# Patient Record
Sex: Female | Born: 1940 | Race: White | Hispanic: No | Smoking: Never smoker
Health system: Southern US, Community
[De-identification: ages and names within clinical notes are randomized; demographics above are authoritative.]

## PROBLEM LIST (undated history)

## (undated) DIAGNOSIS — R7309 Other abnormal glucose: Secondary | ICD-10-CM

## (undated) DIAGNOSIS — I82409 Acute embolism and thrombosis of unspecified deep veins of unspecified lower extremity: Secondary | ICD-10-CM

## (undated) DIAGNOSIS — Z1589 Genetic susceptibility to other disease: Secondary | ICD-10-CM

## (undated) DIAGNOSIS — T39395A Adverse effect of other nonsteroidal anti-inflammatory drugs [NSAID], initial encounter: Secondary | ICD-10-CM

## (undated) DIAGNOSIS — Z9889 Other specified postprocedural states: Secondary | ICD-10-CM

## (undated) DIAGNOSIS — D471 Chronic myeloproliferative disease: Secondary | ICD-10-CM

## (undated) DIAGNOSIS — N200 Calculus of kidney: Secondary | ICD-10-CM

## (undated) DIAGNOSIS — K219 Gastro-esophageal reflux disease without esophagitis: Secondary | ICD-10-CM

## (undated) DIAGNOSIS — K250 Acute gastric ulcer with hemorrhage: Secondary | ICD-10-CM

## (undated) DIAGNOSIS — I2699 Other pulmonary embolism without acute cor pulmonale: Secondary | ICD-10-CM

## (undated) DIAGNOSIS — M810 Age-related osteoporosis without current pathological fracture: Secondary | ICD-10-CM

## (undated) DIAGNOSIS — D689 Coagulation defect, unspecified: Secondary | ICD-10-CM

## (undated) DIAGNOSIS — I10 Essential (primary) hypertension: Secondary | ICD-10-CM

## (undated) DIAGNOSIS — H269 Unspecified cataract: Secondary | ICD-10-CM

## (undated) DIAGNOSIS — K922 Gastrointestinal hemorrhage, unspecified: Secondary | ICD-10-CM

## (undated) DIAGNOSIS — D219 Benign neoplasm of connective and other soft tissue, unspecified: Secondary | ICD-10-CM

## (undated) HISTORY — DX: Genetic susceptibility to other disease: Z15.89

## (undated) HISTORY — DX: Gastro-esophageal reflux disease without esophagitis: K21.9

## (undated) HISTORY — PX: CHOLECYSTECTOMY: SHX55

## (undated) HISTORY — PX: UTERINE FIBROID SURGERY: SHX826

## (undated) HISTORY — DX: Age-related osteoporosis without current pathological fracture: M81.0

## (undated) HISTORY — DX: Gastrointestinal hemorrhage, unspecified: K92.2

## (undated) HISTORY — DX: Chronic myeloproliferative disease: D47.1

## (undated) HISTORY — DX: Acute gastric ulcer with hemorrhage: K25.0

## (undated) HISTORY — PX: OTHER SURGICAL HISTORY: SHX169

## (undated) HISTORY — PX: JOINT REPLACEMENT: SHX530

## (undated) HISTORY — DX: Adverse effect of other nonsteroidal anti-inflammatory drugs (NSAID), initial encounter: T39.395A

## (undated) HISTORY — DX: Coagulation defect, unspecified: D68.9

## (undated) HISTORY — DX: Calculus of kidney: N20.0

## (undated) HISTORY — DX: Essential (primary) hypertension: I10

## (undated) HISTORY — DX: Unspecified cataract: H26.9

## (undated) HISTORY — DX: Other abnormal glucose: R73.09

## (undated) HISTORY — DX: Benign neoplasm of connective and other soft tissue, unspecified: D21.9

## (undated) HISTORY — PX: TONSILLECTOMY: SUR1361

---

## 2004-04-14 ENCOUNTER — Other Ambulatory Visit: Admission: RE | Admit: 2004-04-14 | Discharge: 2004-04-14 | Payer: Self-pay | Admitting: Internal Medicine

## 2004-06-02 ENCOUNTER — Ambulatory Visit: Payer: Self-pay | Admitting: Internal Medicine

## 2004-06-02 LAB — HM COLONOSCOPY: HM Colonoscopy: NORMAL

## 2005-12-27 ENCOUNTER — Other Ambulatory Visit: Admission: RE | Admit: 2005-12-27 | Discharge: 2005-12-27 | Payer: Self-pay | Admitting: Internal Medicine

## 2006-09-28 ENCOUNTER — Ambulatory Visit: Admission: RE | Admit: 2006-09-28 | Discharge: 2006-09-28 | Payer: Self-pay | Admitting: Internal Medicine

## 2008-01-20 ENCOUNTER — Other Ambulatory Visit: Admission: RE | Admit: 2008-01-20 | Discharge: 2008-01-20 | Payer: Self-pay | Admitting: Internal Medicine

## 2008-01-27 LAB — HM PAP SMEAR: HM Pap smear: NORMAL

## 2008-01-30 ENCOUNTER — Ambulatory Visit (HOSPITAL_COMMUNITY): Admission: RE | Admit: 2008-01-30 | Discharge: 2008-01-30 | Payer: Self-pay | Admitting: Internal Medicine

## 2009-06-07 ENCOUNTER — Emergency Department (HOSPITAL_COMMUNITY): Admission: EM | Admit: 2009-06-07 | Discharge: 2009-06-07 | Payer: Self-pay | Admitting: Emergency Medicine

## 2009-06-09 ENCOUNTER — Ambulatory Visit (HOSPITAL_COMMUNITY): Admission: RE | Admit: 2009-06-09 | Discharge: 2009-06-09 | Payer: Self-pay | Admitting: Internal Medicine

## 2009-06-11 ENCOUNTER — Ambulatory Visit (HOSPITAL_COMMUNITY): Admission: RE | Admit: 2009-06-11 | Discharge: 2009-06-11 | Payer: Self-pay | Admitting: Urology

## 2009-06-28 ENCOUNTER — Encounter: Admission: RE | Admit: 2009-06-28 | Discharge: 2009-06-28 | Payer: Self-pay | Admitting: Internal Medicine

## 2009-07-05 ENCOUNTER — Encounter: Admission: RE | Admit: 2009-07-05 | Discharge: 2009-07-05 | Payer: Self-pay | Admitting: Internal Medicine

## 2009-08-05 ENCOUNTER — Ambulatory Visit (HOSPITAL_COMMUNITY): Admission: RE | Admit: 2009-08-05 | Discharge: 2009-08-05 | Payer: Self-pay | Admitting: Obstetrics and Gynecology

## 2010-03-01 ENCOUNTER — Ambulatory Visit (HOSPITAL_COMMUNITY): Admission: RE | Admit: 2010-03-01 | Discharge: 2010-03-01 | Payer: Self-pay | Admitting: Internal Medicine

## 2010-10-16 LAB — BASIC METABOLIC PANEL
BUN: 16 mg/dL (ref 6–23)
CO2: 26 mEq/L (ref 19–32)
Calcium: 9.7 mg/dL (ref 8.4–10.5)
Chloride: 103 mEq/L (ref 96–112)
Creatinine, Ser: 0.75 mg/dL (ref 0.4–1.2)
GFR calc Af Amer: >60 mL/min (ref >60)
GFR calc non Af Amer: >60 mL/min (ref >60)
Glucose, Bld: 84 mg/dL (ref 70–99)
Potassium: 4 mEq/L (ref 3.5–5.1)
Sodium: 136 mEq/L (ref 135–145)

## 2010-10-16 LAB — CBC
HCT: 40.3 % (ref 36.0–46.0)
Hemoglobin: 13.3 g/dL (ref 12.0–15.0)
MCHC: 33.1 g/dL (ref 30.0–36.0)
MCV: 83.9 fL (ref 78.0–100.0)
Platelets: 236 10*3/uL (ref 150–400)
RBC: 4.79 MIL/uL (ref 3.87–5.11)
RDW: 15 % (ref 11.5–15.5)
WBC: 6.2 10*3/uL (ref 4.0–10.5)

## 2010-11-02 LAB — DIFFERENTIAL
Basophils Absolute: 0 10*3/uL (ref 0.0–0.1)
Basophils Relative: 0 % (ref 0–1)
Eosinophils Absolute: 0 10*3/uL (ref 0.0–0.7)
Eosinophils Relative: 0 % (ref 0–5)
Lymphocytes Relative: 12 % (ref 12–46)
Lymphs Abs: 1.5 10*3/uL (ref 0.7–4.0)
Monocytes Absolute: 1.4 10*3/uL — ABNORMAL HIGH (ref 0.1–1.0)
Monocytes Relative: 11 % (ref 3–12)
Neutro Abs: 9.3 10*3/uL — ABNORMAL HIGH (ref 1.7–7.7)
Neutrophils Relative %: 77 % (ref 43–77)

## 2010-11-02 LAB — COMPREHENSIVE METABOLIC PANEL
ALT: 41 U/L — ABNORMAL HIGH (ref 0–35)
AST: 34 U/L (ref 0–37)
Albumin: 3 g/dL — ABNORMAL LOW (ref 3.5–5.2)
Alkaline Phosphatase: 82 U/L (ref 39–117)
BUN: 22 mg/dL (ref 6–23)
CO2: 30 mEq/L (ref 19–32)
Calcium: 9.6 mg/dL (ref 8.4–10.5)
Chloride: 96 mEq/L (ref 96–112)
Creatinine, Ser: 1.42 mg/dL — ABNORMAL HIGH (ref 0.4–1.2)
GFR calc Af Amer: 44 mL/min — ABNORMAL LOW (ref >60)
GFR calc non Af Amer: 37 mL/min — ABNORMAL LOW (ref >60)
Glucose, Bld: 117 mg/dL — ABNORMAL HIGH (ref 70–99)
Potassium: 4.5 mEq/L (ref 3.5–5.1)
Sodium: 138 mEq/L (ref 135–145)
Total Bilirubin: 0.7 mg/dL (ref 0.3–1.2)
Total Protein: 7.6 g/dL (ref 6.0–8.3)

## 2010-11-02 LAB — BASIC METABOLIC PANEL
BUN: 16 mg/dL (ref 6–23)
CO2: 28 mEq/L (ref 19–32)
Calcium: 8.6 mg/dL (ref 8.4–10.5)
Chloride: 88 mEq/L — ABNORMAL LOW (ref 96–112)
Creatinine, Ser: 1.23 mg/dL — ABNORMAL HIGH (ref 0.4–1.2)
GFR calc Af Amer: 52 mL/min — ABNORMAL LOW (ref >60)
GFR calc non Af Amer: 43 mL/min — ABNORMAL LOW (ref >60)
Glucose, Bld: 116 mg/dL — ABNORMAL HIGH (ref 70–99)
Potassium: 2.9 mEq/L — ABNORMAL LOW (ref 3.5–5.1)
Sodium: 130 mEq/L — ABNORMAL LOW (ref 135–145)

## 2010-11-02 LAB — URINALYSIS, ROUTINE W REFLEX MICROSCOPIC
Glucose, UA: NEGATIVE mg/dL
Ketones, ur: 40 mg/dL — AB
Nitrite: NEGATIVE
Protein, ur: NEGATIVE mg/dL
Specific Gravity, Urine: 1.024 (ref 1.005–1.030)
Urobilinogen, UA: 1 mg/dL (ref 0.0–1.0)
pH: 5.5 (ref 5.0–8.0)

## 2010-11-02 LAB — CBC
HCT: 39.1 % (ref 36.0–46.0)
Hemoglobin: 13.3 g/dL (ref 12.0–15.0)
MCHC: 34 g/dL (ref 30.0–36.0)
MCV: 83 fL (ref 78.0–100.0)
Platelets: 273 10*3/uL (ref 150–400)
RBC: 4.71 MIL/uL (ref 3.87–5.11)
RDW: 13.7 % (ref 11.5–15.5)
WBC: 12.2 10*3/uL — ABNORMAL HIGH (ref 4.0–10.5)

## 2010-11-02 LAB — URINE MICROSCOPIC-ADD ON

## 2011-10-19 DIAGNOSIS — E782 Mixed hyperlipidemia: Secondary | ICD-10-CM | POA: Diagnosis not present

## 2011-10-19 DIAGNOSIS — E559 Vitamin D deficiency, unspecified: Secondary | ICD-10-CM | POA: Diagnosis not present

## 2011-10-19 DIAGNOSIS — I10 Essential (primary) hypertension: Secondary | ICD-10-CM | POA: Diagnosis not present

## 2012-02-07 DIAGNOSIS — R609 Edema, unspecified: Secondary | ICD-10-CM | POA: Diagnosis not present

## 2012-02-07 DIAGNOSIS — R0602 Shortness of breath: Secondary | ICD-10-CM | POA: Diagnosis not present

## 2012-02-21 DIAGNOSIS — I10 Essential (primary) hypertension: Secondary | ICD-10-CM | POA: Diagnosis not present

## 2012-02-21 DIAGNOSIS — Z79899 Other long term (current) drug therapy: Secondary | ICD-10-CM | POA: Diagnosis not present

## 2012-02-22 DIAGNOSIS — H2510 Age-related nuclear cataract, unspecified eye: Secondary | ICD-10-CM | POA: Diagnosis not present

## 2012-02-22 DIAGNOSIS — H04129 Dry eye syndrome of unspecified lacrimal gland: Secondary | ICD-10-CM | POA: Diagnosis not present

## 2012-02-22 DIAGNOSIS — H43819 Vitreous degeneration, unspecified eye: Secondary | ICD-10-CM | POA: Diagnosis not present

## 2012-02-22 DIAGNOSIS — H524 Presbyopia: Secondary | ICD-10-CM | POA: Diagnosis not present

## 2012-03-14 DIAGNOSIS — Z1231 Encounter for screening mammogram for malignant neoplasm of breast: Secondary | ICD-10-CM | POA: Diagnosis not present

## 2012-03-19 DIAGNOSIS — I10 Essential (primary) hypertension: Secondary | ICD-10-CM | POA: Diagnosis not present

## 2012-03-19 DIAGNOSIS — Z1212 Encounter for screening for malignant neoplasm of rectum: Secondary | ICD-10-CM | POA: Diagnosis not present

## 2012-03-19 DIAGNOSIS — Z79899 Other long term (current) drug therapy: Secondary | ICD-10-CM | POA: Diagnosis not present

## 2012-03-19 DIAGNOSIS — E782 Mixed hyperlipidemia: Secondary | ICD-10-CM | POA: Diagnosis not present

## 2012-03-19 DIAGNOSIS — E559 Vitamin D deficiency, unspecified: Secondary | ICD-10-CM | POA: Diagnosis not present

## 2012-08-14 DIAGNOSIS — Z23 Encounter for immunization: Secondary | ICD-10-CM | POA: Diagnosis not present

## 2012-10-31 DIAGNOSIS — E559 Vitamin D deficiency, unspecified: Secondary | ICD-10-CM | POA: Diagnosis not present

## 2012-10-31 DIAGNOSIS — E782 Mixed hyperlipidemia: Secondary | ICD-10-CM | POA: Diagnosis not present

## 2012-10-31 DIAGNOSIS — R7309 Other abnormal glucose: Secondary | ICD-10-CM | POA: Diagnosis not present

## 2012-10-31 DIAGNOSIS — Z79899 Other long term (current) drug therapy: Secondary | ICD-10-CM | POA: Diagnosis not present

## 2012-10-31 DIAGNOSIS — I10 Essential (primary) hypertension: Secondary | ICD-10-CM | POA: Diagnosis not present

## 2013-02-27 DIAGNOSIS — H2510 Age-related nuclear cataract, unspecified eye: Secondary | ICD-10-CM | POA: Diagnosis not present

## 2013-02-27 DIAGNOSIS — H43819 Vitreous degeneration, unspecified eye: Secondary | ICD-10-CM | POA: Diagnosis not present

## 2013-02-27 DIAGNOSIS — H04129 Dry eye syndrome of unspecified lacrimal gland: Secondary | ICD-10-CM | POA: Diagnosis not present

## 2013-02-27 DIAGNOSIS — H524 Presbyopia: Secondary | ICD-10-CM | POA: Diagnosis not present

## 2013-03-19 DIAGNOSIS — E559 Vitamin D deficiency, unspecified: Secondary | ICD-10-CM | POA: Diagnosis not present

## 2013-03-19 DIAGNOSIS — R7309 Other abnormal glucose: Secondary | ICD-10-CM | POA: Diagnosis not present

## 2013-03-19 DIAGNOSIS — I10 Essential (primary) hypertension: Secondary | ICD-10-CM | POA: Diagnosis not present

## 2013-03-19 DIAGNOSIS — Z79899 Other long term (current) drug therapy: Secondary | ICD-10-CM | POA: Diagnosis not present

## 2013-03-19 DIAGNOSIS — E782 Mixed hyperlipidemia: Secondary | ICD-10-CM | POA: Diagnosis not present

## 2013-03-19 DIAGNOSIS — Z1212 Encounter for screening for malignant neoplasm of rectum: Secondary | ICD-10-CM | POA: Diagnosis not present

## 2013-03-21 ENCOUNTER — Other Ambulatory Visit: Payer: Self-pay

## 2013-03-21 ENCOUNTER — Other Ambulatory Visit: Payer: Self-pay | Admitting: Internal Medicine

## 2013-03-21 DIAGNOSIS — M79605 Pain in left leg: Secondary | ICD-10-CM

## 2013-03-24 ENCOUNTER — Ambulatory Visit
Admission: RE | Admit: 2013-03-24 | Discharge: 2013-03-24 | Disposition: A | Discharge status: Home / Self Care | Payer: Medicare Other | Source: Ambulatory Visit | Attending: Internal Medicine | Admitting: Internal Medicine

## 2013-03-24 DIAGNOSIS — M7989 Other specified soft tissue disorders: Secondary | ICD-10-CM | POA: Diagnosis not present

## 2013-03-24 DIAGNOSIS — M79605 Pain in left leg: Secondary | ICD-10-CM

## 2013-03-24 DIAGNOSIS — M79609 Pain in unspecified limb: Secondary | ICD-10-CM | POA: Diagnosis not present

## 2013-04-09 DIAGNOSIS — Z803 Family history of malignant neoplasm of breast: Secondary | ICD-10-CM | POA: Diagnosis not present

## 2013-04-09 DIAGNOSIS — M899 Disorder of bone, unspecified: Secondary | ICD-10-CM | POA: Diagnosis not present

## 2013-04-09 DIAGNOSIS — Z1231 Encounter for screening mammogram for malignant neoplasm of breast: Secondary | ICD-10-CM | POA: Diagnosis not present

## 2013-04-09 LAB — HM MAMMOGRAPHY: HM Mammogram: NORMAL

## 2013-04-09 LAB — HM DEXA SCAN

## 2013-04-22 ENCOUNTER — Other Ambulatory Visit: Payer: Self-pay | Admitting: Physician Assistant

## 2013-04-22 DIAGNOSIS — D485 Neoplasm of uncertain behavior of skin: Secondary | ICD-10-CM | POA: Diagnosis not present

## 2013-04-22 DIAGNOSIS — I8310 Varicose veins of unspecified lower extremity with inflammation: Secondary | ICD-10-CM | POA: Diagnosis not present

## 2013-05-08 ENCOUNTER — Other Ambulatory Visit: Payer: Self-pay | Admitting: Physician Assistant

## 2013-05-08 DIAGNOSIS — D485 Neoplasm of uncertain behavior of skin: Secondary | ICD-10-CM | POA: Diagnosis not present

## 2013-05-08 DIAGNOSIS — D2370 Other benign neoplasm of skin of unspecified lower limb, including hip: Secondary | ICD-10-CM | POA: Diagnosis not present

## 2013-06-10 ENCOUNTER — Telehealth: Payer: Self-pay | Admitting: *Deleted

## 2013-06-10 MED ORDER — METOPROLOL SUCCINATE ER 100 MG PO TB24: 100.0000 mg | ORAL_TABLET | Freq: Every day | ORAL | Status: DC

## 2013-06-10 NOTE — Telephone Encounter (Signed)
Refill ON =  METOPROLOL  (BP MED)  CALL [facility].  PH [phone] PT HAS APPT SCHEDULED ON 11/20 SAYS HER PHARMACY HAS BEEN TRYING TO GET A REFILL FOR A WK .

## 2013-06-18 ENCOUNTER — Encounter: Payer: Self-pay | Admitting: Internal Medicine

## 2013-06-18 DIAGNOSIS — E782 Mixed hyperlipidemia: Secondary | ICD-10-CM | POA: Insufficient documentation

## 2013-06-18 DIAGNOSIS — I10 Essential (primary) hypertension: Secondary | ICD-10-CM | POA: Insufficient documentation

## 2013-06-18 DIAGNOSIS — N200 Calculus of kidney: Secondary | ICD-10-CM | POA: Insufficient documentation

## 2013-06-18 DIAGNOSIS — E559 Vitamin D deficiency, unspecified: Secondary | ICD-10-CM | POA: Insufficient documentation

## 2013-06-18 DIAGNOSIS — K219 Gastro-esophageal reflux disease without esophagitis: Secondary | ICD-10-CM | POA: Insufficient documentation

## 2013-06-18 DIAGNOSIS — R7309 Other abnormal glucose: Secondary | ICD-10-CM | POA: Insufficient documentation

## 2013-06-18 NOTE — Patient Instructions (Addendum)
Continue diet & medications same as discussed.   Further disposition pending lab results.        Gastroesophageal Reflux Disease, Adult Gastroesophageal reflux disease (GERD) happens when acid from your stomach flows up into the esophagus. When acid comes in contact with the esophagus, the acid causes soreness (inflammation) in the esophagus. Over time, GERD may create small holes (ulcers) in the lining of the esophagus. CAUSES   Increased body weight. This puts pressure on the stomach, making acid rise from the stomach into the esophagus.  Smoking. This increases acid production in the stomach.  Drinking alcohol. This causes decreased pressure in the lower esophageal sphincter (valve or ring of muscle between the esophagus and stomach), allowing acid from the stomach into the esophagus.  Late evening meals and a full stomach. This increases pressure and acid production in the stomach.  A malformed lower esophageal sphincter. Sometimes, no cause is found. SYMPTOMS   Burning pain in the lower part of the mid-chest behind the breastbone and in the mid-stomach area. This may occur twice a week or more often.  Trouble swallowing.  Sore throat.  Dry cough.  Asthma-like symptoms including chest tightness, shortness of breath, or wheezing. DIAGNOSIS  Your caregiver may be able to diagnose GERD based on your symptoms. In some cases, X-rays and other tests may be done to check for complications or to check the condition of your stomach and esophagus. TREATMENT  Your caregiver may recommend over-the-counter or prescription medicines to help decrease acid production. Ask your caregiver before starting or adding any new medicines.  HOME CARE INSTRUCTIONS   Change the factors that you can control. Ask your caregiver for guidance concerning weight loss, quitting smoking, and alcohol consumption.  Avoid foods and drinks that make your symptoms worse, such as:  Caffeine or alcoholic  drinks.  Chocolate.  Peppermint or mint flavorings.  Garlic and onions.  Spicy foods.  Citrus fruits, such as oranges, lemons, or limes.  Tomato-based foods such as sauce, chili, salsa, and pizza.  Fried and fatty foods.  Avoid lying down for the 3 hours prior to your bedtime or prior to taking a nap.  Eat small, frequent meals instead of large meals.  Wear loose-fitting clothing. Do not wear anything tight around your waist that causes pressure on your stomach.  Raise the head of your bed 6 to 8 inches with wood blocks to help you sleep. Extra pillows will not help.  Only take over-the-counter or prescription medicines for pain, discomfort, or fever as directed by your caregiver.  Do not take aspirin, ibuprofen, or other nonsteroidal anti-inflammatory drugs (NSAIDs). SEEK IMMEDIATE MEDICAL CARE IF:   You have pain in your arms, neck, jaw, teeth, or back.  Your pain increases or changes in intensity or duration.  You develop nausea, vomiting, or sweating (diaphoresis).  You develop shortness of breath, or you faint.  Your vomit is green, yellow, black, or looks like coffee grounds or blood.  Your stool is red, bloody, or black. These symptoms could be signs of other problems, such as heart disease, gastric bleeding, or esophageal bleeding. MAKE SURE YOU:   Understand these instructions.  Will watch your condition.  Will get help right away if you are not doing well or get worse. Document Released: 04/26/2005 Document Revised: 10/09/2011 Document Reviewed: 02/03/2011 [facility] Patient Information 2014 [place], [place].  Reflux (acid reflux) is when acid from your stomach flows up into the esophagus. When acid comes in contact with the  esophagus, the acid causes irritation and soreness (inflammation) in the esophagus. When reflux happens often or so severely that it causes damage to the esophagus, it is called gastroesophageal reflux disease (GERD). Nutrition therapy  can help ease the discomfort of GERD. FOODS OR DRINKS TO AVOID OR LIMIT  Smoking or chewing tobacco. Nicotine is one of the most potent stimulants to acid production in the gastrointestinal tract.  Caffeinated and decaffeinated coffee and black tea.  Regular or low-calorie carbonated beverages or energy drinks (caffeine-free carbonated beverages are allowed).   Strong spices, such as black pepper, white pepper, red pepper, cayenne, curry powder, and chili powder.  Peppermint or spearmint.  Chocolate.  High-fat foods, including meats and fried foods. Extra added fats including oils, butter, salad dressings, and nuts. Limit these to less than 8 tsp per day.  Fruits and vegetables if they are not tolerated, such as citrus fruits or tomatoes.  Alcohol.  Any food that seems to aggravate your condition. If you have questions regarding your diet, call your caregiver or a registered dietitian. OTHER THINGS THAT MAY HELP GERD INCLUDE:   Eating your meals slowly, in a relaxed setting.  Eating 5 to 6 small meals per day instead of 3 large meals.  Eliminating food for a period of time if it causes distress.  Not lying down until 3 hours after eating a meal.  Keeping the head of your bed raised 6 to 9 inches (15 to 23 cm) by using a foam wedge or blocks under the legs of the bed. Lying flat may make symptoms worse.  Being physically active. Weight loss may be helpful in reducing reflux in overweight or obese adults.  Wear loose fitting clothing EXAMPLE MEAL PLAN This meal plan is approximately 2,000 calories based on https://www.bernard.org/ meal planning guidelines. Breakfast   cup cooked oatmeal.  1 cup strawberries.  1 cup low-fat milk.  1 oz almonds. Snack  1 cup cucumber slices.  6 oz yogurt (made from low-fat or fat-free milk). Lunch  2 slice whole-wheat bread.  2 oz sliced [place].  2 tsp mayonnaise.  1 cup blueberries.  1 cup snap peas. Snack  6 whole-wheat  crackers.  1 oz string cheese. Dinner   cup brown rice.  1 cup mixed veggies.  1 tsp olive oil.  3 oz grilled fish. Document Released: 07/17/2005 Document Revised: 10/09/2011 Document Reviewed: 06/02/2011 [facility] Patient Information 2014 [place], [place].    Hypertension As your heart beats, it forces blood through your arteries. This force is your blood pressure. If the pressure is too high, it is called hypertension (HTN) or high blood pressure. HTN is dangerous because you may have it and not know it. High blood pressure may mean that your heart has to work harder to pump blood. Your arteries may be narrow or stiff. The extra work puts you at risk for heart disease, stroke, and other problems.  Blood pressure consists of two numbers, a higher number over a lower, 110/72, for example. It is stated as "110 over 72." The ideal is below 120 for the top number (systolic) and under 80 for the bottom (diastolic). Write down your blood pressure today. You should pay close attention to your blood pressure if you have certain conditions such as:  Heart failure.  Prior heart attack.  Diabetes  Chronic kidney disease.  Prior stroke.  Multiple risk factors for heart disease. To see if you have HTN, your blood pressure should be measured while you are seated with  your arm held at the level of the heart. It should be measured at least twice. A one-time elevated blood pressure reading (especially in the Emergency Department) does not mean that you need treatment. There may be conditions in which the blood pressure is different between your right and left arms. It is important to see your caregiver soon for a recheck. Most people have essential hypertension which means that there is not a specific cause. This type of high blood pressure may be lowered by changing lifestyle factors such as:  Stress.  Smoking.  Lack of exercise.  Excessive weight.  Drug/tobacco/alcohol use.  Eating  less salt. Most people do not have symptoms from high blood pressure until it has caused damage to the body. Effective treatment can often prevent, delay or reduce that damage. TREATMENT  When a cause has been identified, treatment for high blood pressure is directed at the cause. There are a large number of medications to treat HTN. These fall into several categories, and your caregiver will help you select the medicines that are best for you. Medications may have side effects. You should review side effects with your caregiver. If your blood pressure stays high after you have made lifestyle changes or started on medicines,   Your medication(s) may need to be changed.  Other problems may need to be addressed.  Be certain you understand your prescriptions, and know how and when to take your medicine.  Be sure to follow up with your caregiver within the time frame advised (usually within two weeks) to have your blood pressure rechecked and to review your medications.  If you are taking more than one medicine to lower your blood pressure, make sure you know how and at what times they should be taken. Taking two medicines at the same time can result in blood pressure that is too low. SEEK IMMEDIATE MEDICAL CARE IF:  You develop a severe headache, blurred or changing vision, or confusion.  You have unusual weakness or numbness, or a faint feeling.  You have severe chest or abdominal pain, vomiting, or breathing problems. MAKE SURE YOU:   Understand these instructions.  Will watch your condition.  Will get help right away if you are not doing well or get worse. Document Released: 07/17/2005 Document Revised: 10/09/2011 Document Reviewed: 03/06/2008 [facility] Patient Information 2014 [place], [place]. Cholesterol Cholesterol is a white, waxy, fat-like protein needed by your body in small amounts. The liver makes all the cholesterol you need. It is carried from the liver by the blood through  the blood vessels. Deposits (plaque) may build up on blood vessel walls. This makes the arteries narrower and stiffer. Plaque increases the risk for heart attack and stroke. You cannot feel your cholesterol level even if it is very high. The only way to know is by a blood test to check your lipid (fats) levels. Once you know your cholesterol levels, you should keep a record of the test results. Work with your caregiver to to keep your levels in the desired range. WHAT THE RESULTS MEAN:  Total cholesterol is a rough measure of all the cholesterol in your blood.  LDL is the so-called bad cholesterol. This is the type that deposits cholesterol in the walls of the arteries. You want this level to be low.  HDL is the good cholesterol because it cleans the arteries and carries the LDL away. You want this level to be high.  Triglycerides are fat that the body can either burn for energy  or store. High levels are closely linked to heart disease. DESIRED LEVELS:  Total cholesterol below 200.  LDL below 100 for people at risk, below 70 for very high risk.  HDL above 50 is good, above 60 is best.  Triglycerides below 150. HOW TO LOWER YOUR CHOLESTEROL:  Diet.  Choose fish or white meat chicken and [place], roasted or baked. Limit fatty cuts of red meat, fried foods, and processed meats, such as sausage and lunch meat.  Eat lots of fresh fruits and vegetables. Choose whole grains, beans, pasta, potatoes and cereals.  Use only small amounts of olive, corn or canola oils. Avoid butter, mayonnaise, shortening or palm kernel oils. Avoid foods with trans-fats.  Use skim/nonfat milk and low-fat/nonfat yogurt and cheeses. Avoid whole milk, cream, ice cream, egg yolks and cheeses. Healthy desserts include angel food cake, ginger snaps, animal crackers, hard candy, popsicles, and low-fat/nonfat frozen yogurt. Avoid pastries, cakes, pies and cookies.  Exercise.  A regular program helps decrease LDL and  raises HDL.  Helps with weight control.  Do things that increase your activity level like gardening, walking, or taking the stairs.  Medication.  May be prescribed by your caregiver to help lowering cholesterol and the risk for heart disease.  You may need medicine even if your levels are normal if you have several risk factors. HOME CARE INSTRUCTIONS   Follow your diet and exercise programs as suggested by your caregiver.  Take medications as directed.  Have blood work done when your caregiver feels it is necessary. MAKE SURE YOU:   Understand these instructions.  Will watch your condition.  Will get help right away if you are not doing well or get worse. Document Released: 04/11/2001 Document Revised: 10/09/2011 Document Reviewed: 10/02/2007 [facility] Patient Information 2014 [place], [place]. Vitamin D Deficiency Vitamin D is an important vitamin that your body needs. Having too little of it in your body is called a deficiency. A very bad deficiency can make your bones soft and can cause a condition called rickets.  Vitamin D is important to your body for different reasons, such as:   It helps your body absorb 2 minerals called calcium and phosphorus.  It helps make your bones healthy.  It may prevent some diseases, such as diabetes and multiple sclerosis.  It helps your muscles and heart. You can get vitamin D in several ways. It is a natural part of some foods. The vitamin is also added to some dairy products and cereals. Some people take vitamin D supplements. Also, your body makes vitamin D when you are in the sun. It changes the sun's rays into a form of the vitamin that your body can use. CAUSES   Not eating enough foods that contain vitamin D.  Not getting enough sunlight.  Having certain digestive system diseases that make it hard to absorb vitamin D. These diseases include Crohn's disease, chronic pancreatitis, and cystic fibrosis.  Having a surgery in which  part of the stomach or small intestine is removed.  Being obese. Fat cells pull vitamin D out of your blood. That means that obese people may not have enough vitamin D left in their blood and in other body tissues.  Having chronic kidney or liver disease. RISK FACTORS Risk factors are things that make you more likely to develop a vitamin D deficiency. They include:  Being older.  Not being able to get outside very much.  Living in a nursing home.  Having had broken bones.  Having  weak or thin bones (osteoporosis).  Having a disease or condition that changes how your body absorbs vitamin D.  Having dark skin.  Some medicines such as seizure medicines or steroids.  Being overweight or obese. SYMPTOMS Mild cases of vitamin D deficiency may not have any symptoms. If you have a very bad case, symptoms may include:  Bone pain.  Muscle pain.  Falling often.  Broken bones caused by a minor injury, due to osteoporosis. DIAGNOSIS A blood test is the best way to tell if you have a vitamin D deficiency. TREATMENT Vitamin D deficiency can be treated in different ways. Treatment for vitamin D deficiency depends on what is causing it. Options include:  Taking vitamin D supplements.  Taking a calcium supplement. Your caregiver will suggest what dose is best for you. HOME CARE INSTRUCTIONS  Take any supplements that your caregiver prescribes. Follow the directions carefully. Take only the suggested amount.  Have your blood tested 2 months after you start taking supplements.  Eat foods that contain vitamin D. Healthy choices include:  Fortified dairy products, cereals, or juices. Fortified means vitamin D has been added to the food. Check the label on the package to be sure.  Fatty fish like salmon or trout.  Eggs.  Oysters.  Do not use a tanning bed.  Keep your weight at a healthy level. Lose weight if you need to.  Keep all follow-up appointments. Your caregiver will  need to perform blood tests to make sure your vitamin D deficiency is going away. SEEK MEDICAL CARE IF:  You have any questions about your treatment.  You continue to have symptoms of vitamin D deficiency.  You have nausea or vomiting.  You are constipated.  You feel confused.  You have severe abdominal or back pain. MAKE SURE YOU:  Understand these instructions.  Will watch your condition.  Will get help right away if you are not doing well or get worse. Document Released: 10/09/2011 Document Revised: 11/11/2012 Document Reviewed: 10/09/2011 [facility] Patient Information 2014 [place], [place]. Diabetes and Exercise Exercising regularly is important. It is not just about losing weight. It has many health benefits, such as:  Improving your overall fitness, flexibility, and endurance.  Increasing your bone density.  Helping with weight control.  Decreasing your body fat.  Increasing your muscle strength.  Reducing stress and tension.  Improving your overall health. People with diabetes who exercise gain additional benefits because exercise:  Reduces appetite.  Improves the body's use of blood sugar (glucose).  Helps lower or control blood glucose.  Decreases blood pressure.  Helps control blood lipids (such as cholesterol and triglycerides).  Improves the body's use of the hormone insulin by:  Increasing the body's insulin sensitivity.  Reducing the body's insulin needs.  Decreases the risk for heart disease because exercising:  Lowers cholesterol and triglycerides levels.  Increases the levels of good cholesterol (such as high-density lipoproteins [HDL]) in the body.  Lowers blood glucose levels. YOUR ACTIVITY PLAN  Choose an activity that you enjoy and set realistic goals. Your health care provider or diabetes educator can help you make an activity plan that works for you. You can break activities into 2 or 3 sessions throughout the day. Doing so is as  good as one long session. Exercise ideas include:  Taking the dog for a walk.  Taking the stairs instead of the elevator.  Dancing to your favorite song.  Doing your favorite exercise with a friend. RECOMMENDATIONS FOR EXERCISING WITH TYPE 1  OR TYPE 2 DIABETES   Check your blood glucose before exercising. If blood glucose levels are greater than 240 mg/dL, check for urine ketones. Do not exercise if ketones are present.  Avoid injecting insulin into areas of the body that are going to be exercised. For example, avoid injecting insulin into:  The arms when playing tennis.  The legs when jogging.  Keep a record of:  Food intake before and after you exercise.  Expected peak times of insulin action.  Blood glucose levels before and after you exercise.  The type and amount of exercise you have done.  Review your records with your health care provider. Your health care provider will help you to develop guidelines for adjusting food intake and insulin amounts before and after exercising.  If you take insulin or oral hypoglycemic agents, watch for signs and symptoms of hypoglycemia. They include:  Dizziness.  Shaking.  Sweating.  Chills.  Confusion.  Drink plenty of water while you exercise to prevent dehydration or heat stroke. Body water is lost during exercise and must be replaced.  Talk to your health care provider before starting an exercise program to make sure it is safe for you. Remember, almost any type of activity is better than none. Document Released: 10/07/2003 Document Revised: 03/19/2013 Document Reviewed: 12/24/2012 [facility] Patient Information 2014 [place], [place]. Cholesterol Cholesterol is a white, waxy, fat-like protein needed by your body in small amounts. The liver makes all the cholesterol you need. It is carried from the liver by the blood through the blood vessels. Deposits (plaque) may build up on blood vessel walls. This makes the arteries narrower  and stiffer. Plaque increases the risk for heart attack and stroke. You cannot feel your cholesterol level even if it is very high. The only way to know is by a blood test to check your lipid (fats) levels. Once you know your cholesterol levels, you should keep a record of the test results. Work with your caregiver to to keep your levels in the desired range. WHAT THE RESULTS MEAN:  Total cholesterol is a rough measure of all the cholesterol in your blood.  LDL is the so-called bad cholesterol. This is the type that deposits cholesterol in the walls of the arteries. You want this level to be low.  HDL is the good cholesterol because it cleans the arteries and carries the LDL away. You want this level to be high.  Triglycerides are fat that the body can either burn for energy or store. High levels are closely linked to heart disease. DESIRED LEVELS:  Total cholesterol below 200.  LDL below 100 for people at risk, below 70 for very high risk.  HDL above 50 is good, above 60 is best.  Triglycerides below 150. HOW TO LOWER YOUR CHOLESTEROL:  Diet.  Choose fish or white meat chicken and [place], roasted or baked. Limit fatty cuts of red meat, fried foods, and processed meats, such as sausage and lunch meat.  Eat lots of fresh fruits and vegetables. Choose whole grains, beans, pasta, potatoes and cereals.  Use only small amounts of olive, corn or canola oils. Avoid butter, mayonnaise, shortening or palm kernel oils. Avoid foods with trans-fats.  Use skim/nonfat milk and low-fat/nonfat yogurt and cheeses. Avoid whole milk, cream, ice cream, egg yolks and cheeses. Healthy desserts include angel food cake, ginger snaps, animal crackers, hard candy, popsicles, and low-fat/nonfat frozen yogurt. Avoid pastries, cakes, pies and cookies.  Exercise.  A regular program helps decrease LDL and  raises HDL.  Helps with weight control.  Do things that increase your activity level like gardening,  walking, or taking the stairs.  Medication.  May be prescribed by your caregiver to help lowering cholesterol and the risk for heart disease.  You may need medicine even if your levels are normal if you have several risk factors. HOME CARE INSTRUCTIONS   Follow your diet and exercise programs as suggested by your caregiver.  Take medications as directed.  Have blood work done when your caregiver feels it is necessary. MAKE SURE YOU:   Understand these instructions.  Will watch your condition.  Will get help right away if you are not doing well or get worse. Document Released: 04/11/2001 Document Revised: 10/09/2011 Document Reviewed: 10/02/2007 [facility] Patient Information 2014 [place], [place].

## 2013-06-18 NOTE — Progress Notes (Signed)
Patient ID: Kimberly Watson, female   DOB: Aug 29, 1940, 72 y.o.   MRN: [id number]   This very nice 72 yo   presents for 3 month follow up with hypertension, hx/o hyperlipidemia, pre-diabetes and vitamin D deficiency.    BP has been controlled at home. Today's BP is 140.80. Patient denies any cardiac type chest pain, palpitations, dyspnea/orthopnea/PND, dizziness, claudication, or dependent edema.   Hyperlipidemia is controlled with diet. Last cholesterol was 162, HDL  63 , and LDL   73 - all at goal . Patient denies myalgias or other med SE's.    Also, the patient has history of prediabetes/insulin resistance with last A1c of 5.4%. Patient denies any symptoms of reactive hypoglycemia, diabetic polys, paresthesias or visual blurring.   Patient relates she recently saw Dr Kimberly Barters PA for rash on her Lt leg and Bx of atypical lesion.   Further, Patient has history of vitamin D deficiency with last vitamin D of 50. Patient supplements vitamin D without any suspected side-effects.  Current Outpatient Prescriptions on File Prior to Visit  Medication Sig Dispense Refill  . aspirin 81 MG tablet Take 81 mg by mouth every other day.      . calcium carbonate (OS-CAL) 600 MG TABS tablet Take 600 mg by mouth daily.      . cholecalciferol (VITAMIN D) 1000 UNITS tablet Take 1,000 Units by mouth 1 day or 1 dose. Take 2 tablets daily.      . furosemide (LASIX) 40 MG tablet Take 40 mg by mouth.      . metoprolol succinate (TOPROL XL) 100 MG 24 hr tablet Take 1 tablet (100 mg total) by mouth daily. Take with or immediately following a meal.  30 tablet  0  . raloxifene (EVISTA) 60 MG tablet Take 60 mg by mouth daily.       No current facility-administered medications on file prior to visit.     Allergies  Allergen Reactions  . Amoxicillin   . Fosamax [Alendronate Sodium]     esophagitis    PMHx:   Past Medical History  Diagnosis Date  . Hypertension   . Osteoporosis   . GERD (gastroesophageal reflux  disease)   . Nephrolithiasis   . Cataract   . Elevated hemoglobin A1c   . Leiomyoma     adnexal Leiomyoma removal in 1/11    FHx:    Reviewed / unchanged  SHx:    Reviewed / unchanged. Caretaker for her 23 yo father.  Systems Review: Constitutional: Denies fever, chills, wt changes, headaches, insomnia, fatigue, night sweats, change in appetite. Eyes: Denies redness, blurred vision, diplopia, discharge, itchy, watery eyes.  ENT: Denies discharge, congestion, post nasal drip, epistaxis, sore throat, earache, hearing loss, dental pain, tinnitus, vertigo, sinus pain, snoring.  CV: Denies chest pain, palpitations, irregular heartbeat, syncope, dyspnea, diaphoresis, orthopnea, PND, claudication, edema. Respiratory: denies cough, dyspnea, DOE, pleurisy, hoarseness, laryngitis, wheezing.  Gastrointestinal: Denies dysphagia, odynophagia, heartburn, reflux, water brash, abdominal pain or cramps, nausea, vomiting, bloating, diarrhea, constipation, hematemesis, melena, hematochezia,  Hemorrhoids. Genitourinary: Denies dysuria, frequency, urgency, nocturia, hesitancy, discharge, hematuria, flank pain. Musculoskeletal: Denies arthralgias, myalgias, stiffness, jt. swelling, pain, limp, strain/sprain.  Skin: Denies pruritus, rash, hives, warts, acne, eczema, change in skin lesion(s). Neuro: No weakness, tremor, incoordination, spasms, paresthesia, or pain. Psychiatric: Denies confusion, memory loss, or sensory loss. Endo: Denies change in weight, skin, hair change.  Heme/Lymph: No excessive bleeding, bruising, orenlarged lymph nodes.   BP 140/84   P 72   R  18   Ht 5'4"   Wt  172 #   On Exam: Appears well nourished - in no distress. Eyes: PERRLA, EOMs, conjunctiva no swelling or erythema. Sinuses: No frontal/maxillary tenderness ENT/Mouth: EAC's clear, TM's nl w/o erythema, bulging. Nares clear w/o erythema, swelling, exudates. Oropharynx clear without erythema or exudates. Oral hygiene is good.  Tongue normal, non obstructing. Hearing intact.  Neck: Supple. Thyroid nl. Car 2+/2+ without bruits, nodes or JVD. Chest: Respirations nl with BS clear & equal w/o rales, rhonchi, wheezing or stridor.  Cor: Heart sounds normal w/ regular rate and rhythm without sig. murmurs, gallops, clicks, or rubs. Peripheral pulses normal and equal  without edema.  Musculoskeletal: Full ROM all peripheral extremities, joint stability, 5/5 strength, and normal gait.  Skin: Warm, dry without exposed rashes, lesions, ecchymosis apparent.  Neuro: Cranial nerves intact, reflexes equal bilaterally. Sensory-motor testing grossly intact. Tendon reflexes grossly intact.  Pysch: Alert & oriented x 3. Insight and judgement nl & appropriate. No ideations.  Assessment and Plan:  1. Hypertension - Continue monitor blood pressure at home. Continue diet/meds same.  2. Hyperlipidemia - Continue diet, exercise as able,& lifestyle modifications. Continue monitor periodic cholesterol/liver & renal functions   3. Pre-diabetes - Continue diet, exercise, lifestyle modifications. Monitor appropriate labs.  4. Vitamin D Deficiency - Continue supplementation.  5. GERD - discussed iet w/ written instructions and recc trial of OTC Ranitidine 150 mg 1-2 tab qhs and call if Sx's not resolve.  Further disposition pending results of labs.

## 2013-06-19 ENCOUNTER — Ambulatory Visit: Payer: Medicare Other | Admitting: Internal Medicine

## 2013-06-19 ENCOUNTER — Encounter: Payer: Self-pay | Admitting: Internal Medicine

## 2013-06-19 VITALS — BP 140/84 | HR 72 | Temp 98.2°F | Resp 18 | Ht 64.0 in | Wt 172.6 lb

## 2013-06-19 DIAGNOSIS — E782 Mixed hyperlipidemia: Secondary | ICD-10-CM

## 2013-06-19 DIAGNOSIS — Z5189 Encounter for other specified aftercare: Secondary | ICD-10-CM

## 2013-06-19 DIAGNOSIS — E559 Vitamin D deficiency, unspecified: Secondary | ICD-10-CM | POA: Diagnosis not present

## 2013-06-19 DIAGNOSIS — N200 Calculus of kidney: Secondary | ICD-10-CM

## 2013-06-19 DIAGNOSIS — K2100 Gastro-esophageal reflux disease with esophagitis, without bleeding: Secondary | ICD-10-CM

## 2013-06-19 DIAGNOSIS — I10 Essential (primary) hypertension: Secondary | ICD-10-CM

## 2013-06-19 DIAGNOSIS — R7309 Other abnormal glucose: Secondary | ICD-10-CM | POA: Diagnosis not present

## 2013-06-19 DIAGNOSIS — Z79899 Other long term (current) drug therapy: Secondary | ICD-10-CM | POA: Diagnosis not present

## 2013-06-19 LAB — CBC WITH DIFFERENTIAL/PLATELET
Basophils Absolute: 0.1 10*3/uL (ref 0.0–0.1)
Basophils Relative: 1 % (ref 0–1)
Eosinophils Absolute: 0.2 10*3/uL (ref 0.0–0.7)
Eosinophils Relative: 3 % (ref 0–5)
HCT: 44.1 % (ref 36.0–46.0)
Hemoglobin: 14.7 g/dL (ref 12.0–15.0)
Lymphocytes Relative: 36 % (ref 12–46)
Lymphs Abs: 2.6 10*3/uL (ref 0.7–4.0)
MCH: 27.8 pg (ref 26.0–34.0)
MCHC: 33.3 g/dL (ref 30.0–36.0)
MCV: 83.5 fL (ref 78.0–100.0)
Monocytes Absolute: 0.6 10*3/uL (ref 0.1–1.0)
Monocytes Relative: 8 % (ref 3–12)
Neutro Abs: 3.8 10*3/uL (ref 1.7–7.7)
Neutrophils Relative %: 52 % (ref 43–77)
Platelets: 333 10*3/uL (ref 150–400)
RBC: 5.28 MIL/uL — ABNORMAL HIGH (ref 3.87–5.11)
RDW: 14.7 % (ref 11.5–15.5)
WBC: 7.3 10*3/uL (ref 4.0–10.5)

## 2013-06-19 LAB — LIPID PANEL
Cholesterol: 160 mg/dL (ref 0–200)
HDL: 66 mg/dL (ref >39)
LDL Cholesterol: 71 mg/dL (ref 0–99)
Total CHOL/HDL Ratio: 2.4 Ratio
Triglycerides: 116 mg/dL (ref <150)
VLDL: 23 mg/dL (ref 0–40)

## 2013-06-19 LAB — BASIC METABOLIC PANEL WITH GFR
BUN: 16 mg/dL (ref 6–23)
CO2: 28 mEq/L (ref 19–32)
Calcium: 10.3 mg/dL (ref 8.4–10.5)
Chloride: 103 mEq/L (ref 96–112)
Creat: 0.9 mg/dL (ref 0.50–1.10)
GFR, Est African American: 73 mL/min
GFR, Est Non African American: 64 mL/min
Glucose, Bld: 85 mg/dL (ref 70–99)
Potassium: 5.2 mEq/L (ref 3.5–5.3)
Sodium: 141 mEq/L (ref 135–145)

## 2013-06-19 LAB — HEPATIC FUNCTION PANEL
ALT: 14 U/L (ref 0–35)
AST: 18 U/L (ref 0–37)
Albumin: 4.3 g/dL (ref 3.5–5.2)
Alkaline Phosphatase: 73 U/L (ref 39–117)
Bilirubin, Direct: 0.2 mg/dL (ref 0.0–0.3)
Indirect Bilirubin: 0.6 mg/dL (ref 0.0–0.9)
Total Bilirubin: 0.8 mg/dL (ref 0.3–1.2)
Total Protein: 7.6 g/dL (ref 6.0–8.3)

## 2013-06-19 LAB — HEMOGLOBIN A1C
Hgb A1c MFr Bld: 5.6 % (ref <5.7)
Mean Plasma Glucose: 114 mg/dL (ref <117)

## 2013-06-19 LAB — MAGNESIUM: Magnesium: 1.8 mg/dL (ref 1.5–2.5)

## 2013-06-20 LAB — TSH: TSH: 1.336 u[IU]/mL (ref 0.350–4.500)

## 2013-06-20 LAB — INSULIN, FASTING: Insulin fasting, serum: 15 u[IU]/mL (ref 3–28)

## 2013-07-10 ENCOUNTER — Other Ambulatory Visit: Payer: Self-pay

## 2013-07-10 MED ORDER — METOPROLOL SUCCINATE ER 100 MG PO TB24: 100.0000 mg | ORAL_TABLET | Freq: Every day | ORAL | Status: DC

## 2013-08-13 DIAGNOSIS — D2370 Other benign neoplasm of skin of unspecified lower limb, including hip: Secondary | ICD-10-CM | POA: Diagnosis not present

## 2013-08-13 DIAGNOSIS — D239 Other benign neoplasm of skin, unspecified: Secondary | ICD-10-CM | POA: Diagnosis not present

## 2013-09-22 ENCOUNTER — Ambulatory Visit (INDEPENDENT_AMBULATORY_CARE_PROVIDER_SITE_OTHER): Payer: Medicare Other | Admitting: Physician Assistant

## 2013-09-22 ENCOUNTER — Encounter: Payer: Self-pay | Admitting: Physician Assistant

## 2013-09-22 VITALS — BP 130/78 | HR 72 | Temp 97.9°F | Resp 16 | Wt 173.0 lb

## 2013-09-22 DIAGNOSIS — Z79899 Other long term (current) drug therapy: Secondary | ICD-10-CM

## 2013-09-22 DIAGNOSIS — E782 Mixed hyperlipidemia: Secondary | ICD-10-CM

## 2013-09-22 DIAGNOSIS — I10 Essential (primary) hypertension: Secondary | ICD-10-CM

## 2013-09-22 DIAGNOSIS — E559 Vitamin D deficiency, unspecified: Secondary | ICD-10-CM | POA: Diagnosis not present

## 2013-09-22 DIAGNOSIS — R7309 Other abnormal glucose: Secondary | ICD-10-CM

## 2013-09-22 LAB — LIPID PANEL
Cholesterol: 177 mg/dL (ref 0–200)
HDL: 68 mg/dL (ref >39)
LDL Cholesterol: 85 mg/dL (ref 0–99)
Total CHOL/HDL Ratio: 2.6 Ratio
Triglycerides: 119 mg/dL (ref <150)
VLDL: 24 mg/dL (ref 0–40)

## 2013-09-22 LAB — CBC WITH DIFFERENTIAL/PLATELET
Basophils Absolute: 0.1 10*3/uL (ref 0.0–0.1)
Basophils Relative: 1 % (ref 0–1)
Eosinophils Absolute: 0.3 10*3/uL (ref 0.0–0.7)
Eosinophils Relative: 4 % (ref 0–5)
HCT: 43.5 % (ref 36.0–46.0)
Hemoglobin: 14.9 g/dL (ref 12.0–15.0)
Lymphocytes Relative: 35 % (ref 12–46)
Lymphs Abs: 2.5 10*3/uL (ref 0.7–4.0)
MCH: 27.9 pg (ref 26.0–34.0)
MCHC: 34.3 g/dL (ref 30.0–36.0)
MCV: 81.5 fL (ref 78.0–100.0)
Monocytes Absolute: 0.6 10*3/uL (ref 0.1–1.0)
Monocytes Relative: 8 % (ref 3–12)
Neutro Abs: 3.6 10*3/uL (ref 1.7–7.7)
Neutrophils Relative %: 52 % (ref 43–77)
Platelets: 338 10*3/uL (ref 150–400)
RBC: 5.34 MIL/uL — ABNORMAL HIGH (ref 3.87–5.11)
RDW: 15.2 % (ref 11.5–15.5)
WBC: 7 10*3/uL (ref 4.0–10.5)

## 2013-09-22 LAB — HEPATIC FUNCTION PANEL
ALT: 14 U/L (ref 0–35)
AST: 18 U/L (ref 0–37)
Albumin: 4.6 g/dL (ref 3.5–5.2)
Alkaline Phosphatase: 76 U/L (ref 39–117)
Bilirubin, Direct: 0.2 mg/dL (ref 0.0–0.3)
Indirect Bilirubin: 0.6 mg/dL (ref 0.2–1.2)
Total Bilirubin: 0.8 mg/dL (ref 0.2–1.2)
Total Protein: 7.7 g/dL (ref 6.0–8.3)

## 2013-09-22 LAB — BASIC METABOLIC PANEL WITH GFR
BUN: 17 mg/dL (ref 6–23)
CO2: 30 mEq/L (ref 19–32)
Calcium: 10.2 mg/dL (ref 8.4–10.5)
Chloride: 99 mEq/L (ref 96–112)
Creat: 0.87 mg/dL (ref 0.50–1.10)
GFR, Est African American: 76 mL/min
GFR, Est Non African American: 66 mL/min
Glucose, Bld: 94 mg/dL (ref 70–99)
Potassium: 3.9 mEq/L (ref 3.5–5.3)
Sodium: 140 mEq/L (ref 135–145)

## 2013-09-22 LAB — MAGNESIUM: Magnesium: 2 mg/dL (ref 1.5–2.5)

## 2013-09-22 LAB — HEMOGLOBIN A1C
Hgb A1c MFr Bld: 5.5 % (ref <5.7)
Mean Plasma Glucose: 111 mg/dL (ref <117)

## 2013-09-22 LAB — TSH: TSH: 0.987 u[IU]/mL (ref 0.350–4.500)

## 2013-09-22 NOTE — Patient Instructions (Signed)
Preventative Care for Adults - Female      MAINTAIN REGULAR HEALTH EXAMS:  A routine yearly physical is a good way to check in with your primary care provider about your health and preventive screening. It is also an opportunity to share updates about your health and any concerns you have, and receive a thorough all-over exam.   Most health insurance companies pay for at least some preventative services.  Check with your health plan for specific coverages.  WHAT PREVENTATIVE SERVICES DO WOMEN NEED?  Adult women should have their weight and blood pressure checked regularly.   Women age 35 and older should have their cholesterol levels checked regularly.  Women should be screened for cervical cancer with a Pap smear and pelvic exam beginning at either age 21, or 3 years after they become sexually activity.    Breast cancer screening generally begins at age 40 with a mammogram and breast exam by your primary care provider.    Beginning at age 50 and continuing to age 75, women should be screened for colorectal cancer.  Certain people may need continued testing until age 85.  Updating vaccinations is part of preventative care.  Vaccinations help protect against diseases such as the flu.  Osteoporosis is a disease in which the bones lose minerals and strength as we age. Women ages 65 and over should discuss this with their caregivers, as should women after menopause who have other risk factors.  Lab tests are generally done as part of preventative care to screen for anemia and blood disorders, to screen for problems with the kidneys and liver, to screen for bladder problems, to check blood sugar, and to check your cholesterol level.  Preventative services generally include counseling about diet, exercise, avoiding tobacco, drugs, excessive alcohol consumption, and sexually transmitted infections.    GENERAL RECOMMENDATIONS FOR GOOD HEALTH:  Healthy diet:  Eat a variety of foods, including  fruit, vegetables, animal or vegetable protein, such as meat, fish, chicken, and eggs, or beans, lentils, tofu, and grains, such as rice.  Drink plenty of water daily.  Decrease saturated fat in the diet, avoid lots of red meat, processed foods, sweets, fast foods, and fried foods.  Exercise:  Aerobic exercise helps maintain good heart health. At least 30-40 minutes of moderate-intensity exercise is recommended. For example, a brisk walk that increases your heart rate and breathing. This should be done on most days of the week.   Find a type of exercise or a variety of exercises that you enjoy so that it becomes a part of your daily life.  Examples are running, walking, swimming, water aerobics, and biking.  For motivation and support, explore group exercise such as aerobic class, spin class, Zumba, Yoga,or  martial arts, etc.    Set exercise goals for yourself, such as a certain weight goal, walk or run in a race such as a 5k walk/run.  Speak to your primary care provider about exercise goals.  Disease prevention:  If you smoke or chew tobacco, find out from your caregiver how to quit. It can literally save your life, no matter how long you have been a tobacco user. If you do not use tobacco, never begin.   Maintain a healthy diet and normal weight. Increased weight leads to problems with blood pressure and diabetes.   The Body Mass Index or BMI is a way of measuring how much of your body is fat. Having a BMI above 27 increases the risk of heart disease,   diabetes, hypertension, stroke and other problems related to obesity. Your caregiver can help determine your BMI and based on it develop an exercise and dietary program to help you achieve or maintain this important measurement at a healthful level.  High blood pressure causes heart and blood vessel problems.  Persistent high blood pressure should be treated with medicine if weight loss and exercise do not work.   Fat and cholesterol leaves  deposits in your arteries that can block them. This causes heart disease and vessel disease elsewhere in your body.  If your cholesterol is found to be high, or if you have heart disease or certain other medical conditions, then you may need to have your cholesterol monitored frequently and be treated with medication.   Ask if you should have a cardiac stress test if your history suggests this. A stress test is a test done on a treadmill that looks for heart disease. This test can find disease prior to there being a problem.  Menopause can be associated with physical symptoms and risks. Hormone replacement therapy is available to decrease these. You should talk to your caregiver about whether starting or continuing to take hormones is right for you.   Osteoporosis is a disease in which the bones lose minerals and strength as we age. This can result in serious bone fractures. Risk of osteoporosis can be identified using a bone density scan. Women ages 65 and over should discuss this with their caregivers, as should women after menopause who have other risk factors. Ask your caregiver whether you should be taking a calcium supplement and Vitamin D, to reduce the rate of osteoporosis.   Avoid drinking alcohol in excess (more than two drinks per day).  Avoid use of street drugs. Do not share needles with anyone. Ask for professional help if you need assistance or instructions on stopping the use of alcohol, cigarettes, and/or drugs.  Brush your teeth twice a day with fluoride toothpaste, and floss once a day. Good oral hygiene prevents tooth decay and gum disease. The problems can be painful, unattractive, and can cause other health problems. Visit your dentist for a routine oral and dental check up and preventive care every 6-12 months.   Look at your skin regularly.  Use a mirror to look at your back. Notify your caregivers of changes in moles, especially if there are changes in shapes, colors, a size  larger than a pencil eraser, an irregular border, or development of new moles.  Safety:  Use seatbelts 100% of the time, whether driving or as a passenger.  Use safety devices such as hearing protection if you work in environments with loud noise or significant background noise.  Use safety glasses when doing any work that could send debris in to the eyes.  Use a helmet if you ride a bike or motorcycle.  Use appropriate safety gear for contact sports.  Talk to your caregiver about gun safety.  Use sunscreen with a SPF (or skin protection factor) of 15 or greater.  Lighter skinned people are at a greater risk of skin cancer. Don't forget to also wear sunglasses in order to protect your eyes from too much damaging sunlight. Damaging sunlight can accelerate cataract formation.   Practice safe sex. Use condoms. Condoms are used for birth control and to help reduce the spread of sexually transmitted infections (or STIs).  Some of the STIs are gonorrhea (the clap), chlamydia, syphilis, trichomonas, herpes, HPV (human papilloma virus) and HIV (human immunodeficiency virus)   which causes AIDS. The herpes, HIV and HPV are viral illnesses that have no cure. These can result in disability, cancer and death.   Keep carbon monoxide and smoke detectors in your home functioning at all times. Change the batteries every 6 months or use a model that plugs into the wall.   Vaccinations:  Stay up to date with your tetanus shots and other required immunizations. You should have a booster for tetanus every 10 years. Be sure to get your flu shot every year, since 5%-20% of the [place] population comes down with the flu. The flu vaccine changes each year, so being vaccinated once is not enough. Get your shot in the fall, before the flu season peaks.   Other vaccines to consider:  Human Papilloma Virus or HPV causes cancer of the cervix, and other infections that can be transmitted from person to person. There is a vaccine for  HPV, and females should get immunized between the ages of 75 and 33. It requires a series of 3 shots.   Pneumococcal vaccine to protect against certain types of pneumonia.  This is normally recommended for adults age 4 or older.  However, adults younger than 74 years old with certain underlying conditions such as diabetes, heart or lung disease should also receive the vaccine.  Shingles vaccine to protect against Varicella Zoster if you are older than age 67, or younger than 74 years old with certain underlying illness.  Hepatitis A vaccine to protect against a form of infection of the liver by a virus acquired from food.  Hepatitis B vaccine to protect against a form of infection of the liver by a virus acquired from blood or body fluids, particularly if you work in health care.  If you plan to travel internationally, check with your local health department for specific vaccination recommendations.  Cancer Screening:  Breast cancer screening is essential to preventive care for women. All women age 38 and older should perform a breast self-exam every month. At age 59 and older, women should have their caregiver complete a breast exam each year. Women at ages 23 and older should have a mammogram (x-ray film) of the breasts. Your caregiver can discuss how often you need mammograms.    Cervical cancer screening includes taking a Pap smear (sample of cells examined under a microscope) from the cervix (end of the uterus). It also includes testing for HPV (Human Papilloma Virus, which can cause cervical cancer). Screening and a pelvic exam should begin at age 56, or 3 years after a woman becomes sexually active. Screening should occur every year, with a Pap smear but no HPV testing, up to age 65. After age 34, you should have a Pap smear every 3 years with HPV testing, if no HPV was found previously.   Most routine colon cancer screening begins at the age of 74. On a yearly basis, doctors may provide  special easy to use take-home tests to check for hidden blood in the stool. Sigmoidoscopy or colonoscopy can detect the earliest forms of colon cancer and is life saving. These tests use a small camera at the end of a tube to directly examine the colon. Speak to your caregiver about this at age 81, when routine screening begins (and is repeated every 5 years unless early forms of pre-cancerous polyps or small growths are found).    What is the TMJ? The temporomandibular (tem-PUH-ro-man-DIB-yoo-ler) joint, or the TMJ, connects the upper and lower jawbones. This joint allows the jaw  to open wide and move back and forth when you chew, talk, or yawn.There are also several muscles that help this joint move. There can be muscle tightness and pain in the muscle that can cause several symptoms.  What causes TMJ pain? There are many causes of TMJ pain. Repeated chewing (for example, chewing gum) and clenching your teeth can cause pain in the joint. Some TMJ pain has no obvious cause. What can I do to ease the pain? There are many things you can do to help your pain get better. When you have pain:  Eat soft foods and stay away from chewy foods (for example, taffy) Try to use both sides of your mouth to chew Don't chew gum Don't open your mouth wide (for example, during yawning or singing) Don't bite your cheeks or fingernails Lower your amount of stress and worry Applying a warm, damp washcloth to the joint may help. Over-the-counter pain medicines such as ibuprofen (one brand: Advil) or acetaminophen (one brand: Tylenol) might also help. Do not use these medicines if you are allergic to them or if your doctor told you not to use them. How can I stop the pain from coming back? When your pain is better, you can do these exercises to make your muscles stronger and to keep the pain from coming back:  Resisted mouth opening: Place your thumb or two fingers under your chin and open your mouth slowly, pushing up  lightly on your chin with your thumb. Hold for three to six seconds. Close your mouth slowly. Resisted mouth closing: Place your thumbs under your chin and your two index fingers on the ridge between your mouth and the bottom of your chin. Push down lightly on your chin as you close your mouth. Tongue up: Slowly open and close your mouth while keeping the tongue touching the roof of the mouth. Side-to-side jaw movement: Place an object about one fourth of an inch thick (for example, two tongue depressors) between your front teeth. Slowly move your jaw from side to side. Increase the thickness of the object as the exercise becomes easier Forward jaw movement: Place an object about one fourth of an inch thick between your front teeth and move the bottom jaw forward so that the bottom teeth are in front of the top teeth. Increase the thickness of the object as the exercise becomes easier. These exercises should not be painful. If it hurts to do these exercises, stop doing them and talk to your family doctor  Advance Directive Advance directives are the legal documents that allow you to make choices about your health care and medical treatment if you cannot speak for yourself. Advance directives are a way for you to communicate your wishes to family, friends, and health care providers. The specified people can then convey your decisions about end-of-life care to avoid confusion if you should become unable to communicate. Ideally, the process of discussing and writing advance directives should be discussed over time rather than making decisions all at once. Advance directives can be modified as your situation changes and you can change your mind at any time even after you have signed the advance directives. Each state has its own laws regarding advance directives. You may want to check with your health care provider, attorney, or state representative about the law in your state. Below are some examples of  advance directives. LIVING WILL A living will is a set of instructions documenting your wishes about medical care when you cannot  care for yourself. It is used if you become:  Terminally ill.  Incapacitated.  Unable to communicate.  Unable to make decisions. Items to consider in your living will include:  The use or non-use of life-sustaining equipment, such as dialysis machines and breathing machines (ventilators).  A "do not resuscitate" (DNR) order, which is the instruction not to use CPR if breathing or heartbeat stops.  Tube feeding.  Withholding of food and fluids.  Comfort (palliative) care when the goal becomes comfort rather than a cure.  Organ and tissue donation. A living does not give instructions about distribution of your money and property if you should pass away. It is advisable to seek the expert advice of a lawyer in drawing up a will regarding your possessions. Decisions about taxes, beneficiaries, and asset distribution will be legally binding. This process can relieve your family and friends of any burdens surrounding disputes or questions that may come up about the allocation of your assets. DO NOT RESUSCITATE (DNR) A do not resuscitate (DNR) order is a request to not have cardiopulmonary resuscitation (CPR) in the event that your heart stops or you stop breathing. Unless given other instructions, a health care provider will try to help any patient whose heart has stopped or who has stopped breathing.  HEALTHCARE PROXY AND DURABLE POWER OF ATTORNEY FOR HEALTH CARE A health care proxy is a person (agent) appointed to make medical decisions for you if you cannot. Generally, people choose someone they know well and trust to represent their preferences when they can no longer do so. You should be sure to ask this person for agreement to act as your agent. An agent may have to exercise judgment in the event of a medical decision for which your wishes are not known. The  durable power of attorney for health care is the legal document that names your health care proxy. Once written, it should be:  Signed.  Notarized.  Dated.  Copied.  Witnessed.  Incorporated into your medical record. You may also want to appoint someone to manage your financial affairs if you cannot. This is called a durable power of attorney for finances. It is a separate legal document from the durable power of attorney for health care. You may choose the same person or someone different from your health care proxy to act as your agent in financial matters. Document Released: 10/24/2007 Document Revised: 03/19/2013 Document Reviewed: 12/04/2012 [facility] Patient Information 2014 [place], [place].

## 2013-09-22 NOTE — Progress Notes (Signed)
Subjective:   Kimberly Watson is a 73 y.o. female who presents for Medicare Annual Wellness Visit and 3 month follow up on hypertension, prediabetes, hyperlipidemia, vitamin D def.  Date of last medicare wellness visit is unknown.   Her blood pressure has been controlled at home, today their BP is BP: 130/78 mmHg She denies chest pain, shortness of breath, dizziness.  Her cholesterol is diet controlled.  Her cholesterol is controlled. The cholesterol last visit was:   Lab Results  Component Value Date   CHOL 160 06/19/2013   HDL 66 06/19/2013   LDLCALC 71 06/19/2013   TRIG 116 06/19/2013   CHOLHDL 2.4 06/19/2013   She has been working on diet and exercise for prediabetes, and has no polyuria or polydipsia, no chest pain, dyspnea or TIAs, no numbness, tingling or pain in extremities. Last A1C in the office was:  Lab Results  Component Value Date   HGBA1C 5.6 06/19/2013   Patient is on Vitamin D supplement.   Names of Other Physician/Practitioners you currently use: 1. [facility]- here for primary care 2. Dr. Herbert Deaner, eye doctor, last visit Glasses/contacts, 11/2012 3. , dentist, last visit every 6 months, 06/2013 Patient Care Team: Unk Pinto, MD as PCP - General (Internal Medicine) Johna Sheriff, MD as Consulting Physician (Ophthalmology) Gatha Mayer, MD as Consulting Physician (Gastroenterology) Claybon Jabs, MD as Consulting Physician (Urology) Cheri Fowler, MD as Consulting Physician (Obstetrics and Gynecology)  Medical Services you may have received from other than [facility] providers in the past year (date may be approximate) N/A  Medication Review Current Outpatient Prescriptions on File Prior to Visit  Medication Sig Dispense Refill  . aspirin 81 MG tablet Take 81 mg by mouth every other day.      . calcium carbonate (OS-CAL) 600 MG TABS tablet Take 600 mg by mouth daily.      . cholecalciferol (VITAMIN D) 1000 UNITS  tablet Take 1,000 Units by mouth 1 day or 1 dose. Take 3 tablets daily.      . clobetasol ointment (TEMOVATE) 0.05 %       . furosemide (LASIX) 40 MG tablet Take 40 mg by mouth. States misses doses and takes otc potassium when she takes Lasix      . metoprolol succinate (TOPROL XL) 100 MG 24 hr tablet Take 1 tablet (100 mg total) by mouth daily. Take with or immediately following a meal.  30 tablet  4  . raloxifene (EVISTA) 60 MG tablet Take 60 mg by mouth daily.       No current facility-administered medications on file prior to visit.    Current Problems (verified) Patient Active Problem List   Diagnosis Date Noted  . Unspecified essential hypertension 06/18/2013  . Mixed hyperlipidemia 06/18/2013  . Other abnormal glucose 06/18/2013  . Unspecified vitamin D deficiency 06/18/2013  . Reflux esophagitis 06/18/2013  . Nephrolithiasis 06/18/2013   Screening Tests Health Maintenance  Topic Date Due  . Tetanus/tdap  03/14/1960  . Pneumococcal Polysaccharide Vaccine Age 16 And Over  03/13/2005  . Influenza Vaccine  02/28/2013  . Colonoscopy  06/02/2014  . Mammogram  04/10/2015  . Zostavax  Completed    Health Maintenance Topics with due status: Overdue     Topic Date Due   TETANUS/TDAP 03/14/1959   PNEUMOCOCCAL POLYSACCHARIDE VACCINE AGE 16 AND OVER 03/13/2005   INFLUENZA VACCINE 02/28/2013    Immunization History  Administered Date(s) Administered  . Zoster 12/27/2005  Preventative care: Last colonoscopy: 2005 due 2015 Last mammogram: 02/2013 Last pap smear/pelvic exam: remote DEXA: 03/2013 Osteopenia- had reclast 08,09,11)   Prior vaccinations: TD or Tdap: 2007  Influenza: declines  Pneumococcal: 2008 Shingles/Zostavax: 2007  History reviewed: allergies, current medications, past family history, past medical history, past social history, past surgical history and problem list  Risk Factors: Osteoporosis: postmenopausal estrogen deficiency and  amenorrhea History of fracture in the past year: no  Tobacco History  Smoking status  . Never Smoker   Smokeless tobacco  . Never Used   She does not smoke.  Patient is not a former smoker. Are there smokers in your home (other than you)?  No  Alcohol Current alcohol use: none  Caffeine Current caffeine use: coffee 1 /day  Exercise Exercise limitations: The patient has no exercise limitations. Current exercise: no regular exercise but wants to start  Nutrition/Diet Current diet: in general, a "healthy" diet    Cardiac risk factors: advanced age (older than 53 for men, 58 for women), family history of premature cardiovascular disease, hypertension and sedentary lifestyle.  Depression Screen Nurse depression screen reviewed.  (Note: if answer to either of the following is "Yes", a more complete depression screening is indicated)   Q1: Over the past two weeks, have you felt down, depressed or hopeless? No  Q2: Over the past two weeks, have you felt little interest or pleasure in doing things? No  Have you lost interest or pleasure in daily life? No  Do you often feel hopeless? No  Do you cry easily over simple problems? No  Activities of Daily Living Nurse ADLs screen reviewed.  In your present state of health, do you have any difficulty performing the following activities?:  Driving? No Managing money?  No Feeding yourself? No Getting from bed to chair? No Climbing a flight of stairs? No Preparing food and eating?: No Bathing or showering? No Getting dressed: No Getting to the toilet? No Using the toilet:No Moving around from place to place: No In the past year have you fallen or had a near fall?:No   Are you sexually active?  No  Do you have more than one partner?  No  Vision Difficulties: Yes- has contacts and glasses  Hearing Difficulties: No Do you often ask people to speak up or repeat themselves? No Do you experience ringing or noises in your ears?  No Do you have difficulty understanding soft or whispered voices? No  Cognition  Do you feel that you have a problem with memory? No  Do you often misplace items? No  Do you feel safe at home?  Yes  Advanced directives Does patient have a [place]? No Does patient have a Living Will? No   Objective:     Vision and hearing screens reviewed.   Blood pressure 130/78, pulse 72, temperature 97.9 F (36.6 C), resp. rate 16, weight 173 lb (78.472 kg). Body mass index is 29.68 kg/(m^2).  General appearance: alert, no distress, WD/WN,  female Cognitive Testing  Alert? Yes  Normal Appearance?Yes  Oriented to person? Yes  Place? Yes   Time? Yes  Recall of three objects?  Yes  Can perform simple calculations? Yes  Displays appropriate judgment?Yes  Can read the correct time from a watch face?Yes  HEENT: normocephalic, sclerae anicteric, TMs pearly, nares patent, no discharge or erythema, pharynx normal Oral cavity: MMM, no lesions Neck: supple, no lymphadenopathy, no thyromegaly, no masses Heart: RRR, normal S1, S2, no murmurs  Lungs: CTA bilaterally, no wheezes, rhonchi, or rales Abdomen: +bs, soft, non tender, non distended, no masses, no hepatomegaly, no splenomegaly Musculoskeletal: nontender, no swelling, no obvious deformity Extremities: no edema, no cyanosis, no clubbing Pulses: 2+ symmetric, upper and lower extremities, normal cap refill Neurological: alert, oriented x 3, CN2-12 intact, strength normal upper extremities and lower extremities, sensation normal throughout, DTRs 2+ throughout, no cerebellar signs, gait normal Psychiatric: normal affect, behavior normal, pleasant  Breast: nontender, no masses or lumps, no skin changes, no nipple discharge or inversion, no axillary lymphadenopathy Gyn: defer  Rectal: defer   Assessment:   Problem List Items Addressed This Visit     Cardiovascular and Mediastinum   Unspecified essential hypertension -  Primary   Relevant Orders      CBC with Differential      BASIC METABOLIC PANEL WITH GFR      Hepatic function panel      TSH     Other   Mixed hyperlipidemia   Relevant Orders      Lipid panel   Other abnormal glucose   Relevant Orders      Hemoglobin A1c      Insulin, fasting   Unspecified vitamin D deficiency    Other Visit Diagnoses   Encounter for long-term (current) use of medications        Relevant Orders       Magnesium       Plan:   During the course of the visit the patient was educated and counseled about appropriate screening and preventive services including:    Influenza vaccine- declines until next year  Screening electrocardiogram  Screening mammography- yearly  Bone densitometry screening- due 2016  Colorectal cancer screening- DUE THIS YEAR  Diabetes screening- today  Glaucoma screening- yearly  Nutrition counseling   Advanced directives: has NO advanced directive  - add't info requested. Referral to SW: yes  Screening recommendations, referrals:  Vaccinations: Tdap vaccine no  Influenza vaccine no Pneumococcal vaccine no Shingles vaccine no Hep B vaccine no  Nutrition assessed and recommended  Colonoscopy yes Mammogram yearly Pap smear not done Pelvic exam not done Recommended yearly ophthalmology/optometry visit for glaucoma screening and checkup Recommended yearly dental visit for hygiene and checkup Advanced directives - yes  Conditions/risks identified: BMI: Discussed weight loss, diet, and increase physical activity.  Increase physical activity: AHA recommends 150 minutes of physical activity a week.  Medications reviewed DEXA- next year Urinary Incontinence is not an issue: discussed non pharmacology and pharmacology options.  Fall risk: low- discussed PT, home fall assessment, medications.   Medicare Attestation I have personally reviewed: The patient's medical and social history Their use of alcohol, tobacco or  illicit drugs Their current medications and supplements The patient's functional ability including ADLs,fall risks, home safety risks, cognitive, and hearing and visual impairment Diet and physical activities Evidence for depression or mood disorders  The patient's weight, height, BMI, and visual acuity have been recorded in the chart.  I have made referrals, counseling, and provided education to the patient based on review of the above and I have provided the patient with a written personalized care plan for preventive services.     Vicie Mutters, PA-C   09/22/2013

## 2013-09-23 LAB — INSULIN, FASTING: Insulin fasting, serum: 14 u[IU]/mL (ref 3–28)

## 2013-12-13 ENCOUNTER — Other Ambulatory Visit: Payer: Self-pay | Admitting: Physician Assistant

## 2014-01-20 DIAGNOSIS — D239 Other benign neoplasm of skin, unspecified: Secondary | ICD-10-CM | POA: Diagnosis not present

## 2014-03-25 ENCOUNTER — Ambulatory Visit (INDEPENDENT_AMBULATORY_CARE_PROVIDER_SITE_OTHER): Payer: Medicare Other | Admitting: Physician Assistant

## 2014-03-25 ENCOUNTER — Encounter: Payer: Self-pay | Admitting: Physician Assistant

## 2014-03-25 VITALS — BP 122/78 | HR 72 | Temp 98.6°F | Resp 16 | Ht 64.0 in | Wt 174.0 lb

## 2014-03-25 DIAGNOSIS — K2100 Gastro-esophageal reflux disease with esophagitis, without bleeding: Secondary | ICD-10-CM | POA: Diagnosis not present

## 2014-03-25 DIAGNOSIS — N200 Calculus of kidney: Secondary | ICD-10-CM

## 2014-03-25 DIAGNOSIS — I10 Essential (primary) hypertension: Secondary | ICD-10-CM | POA: Diagnosis not present

## 2014-03-25 DIAGNOSIS — Z0000 Encounter for general adult medical examination without abnormal findings: Secondary | ICD-10-CM

## 2014-03-25 DIAGNOSIS — R7309 Other abnormal glucose: Secondary | ICD-10-CM | POA: Diagnosis not present

## 2014-03-25 DIAGNOSIS — Z23 Encounter for immunization: Secondary | ICD-10-CM | POA: Diagnosis not present

## 2014-03-25 DIAGNOSIS — E782 Mixed hyperlipidemia: Secondary | ICD-10-CM

## 2014-03-25 DIAGNOSIS — Z79899 Other long term (current) drug therapy: Secondary | ICD-10-CM

## 2014-03-25 DIAGNOSIS — E559 Vitamin D deficiency, unspecified: Secondary | ICD-10-CM

## 2014-03-25 LAB — CBC WITH DIFFERENTIAL/PLATELET
Basophils Absolute: 0.1 10*3/uL (ref 0.0–0.1)
Basophils Relative: 1 % (ref 0–1)
Eosinophils Absolute: 0.3 10*3/uL (ref 0.0–0.7)
Eosinophils Relative: 4 % (ref 0–5)
HCT: 37.8 % (ref 36.0–46.0)
Hemoglobin: 12.8 g/dL (ref 12.0–15.0)
Lymphocytes Relative: 35 % (ref 12–46)
Lymphs Abs: 2.4 10*3/uL (ref 0.7–4.0)
MCH: 26.6 pg (ref 26.0–34.0)
MCHC: 33.9 g/dL (ref 30.0–36.0)
MCV: 78.6 fL (ref 78.0–100.0)
Monocytes Absolute: 0.5 10*3/uL (ref 0.1–1.0)
Monocytes Relative: 7 % (ref 3–12)
Neutro Abs: 3.7 10*3/uL (ref 1.7–7.7)
Neutrophils Relative %: 53 % (ref 43–77)
Platelets: 383 10*3/uL (ref 150–400)
RBC: 4.81 MIL/uL (ref 3.87–5.11)
RDW: 14.2 % (ref 11.5–15.5)
WBC: 6.9 10*3/uL (ref 4.0–10.5)

## 2014-03-25 LAB — HEMOGLOBIN A1C
Hgb A1c MFr Bld: 6 % — ABNORMAL HIGH (ref <5.7)
Mean Plasma Glucose: 126 mg/dL — ABNORMAL HIGH (ref <117)

## 2014-03-25 MED ORDER — NYSTATIN 100000 UNIT/GM EX POWD: CUTANEOUS | Status: DC

## 2014-03-25 MED ORDER — CLOBETASOL PROPIONATE 0.05 % EX OINT: TOPICAL_OINTMENT | Freq: Two times a day (BID) | CUTANEOUS | Status: DC

## 2014-03-25 NOTE — Progress Notes (Signed)
Complete Physical  Assessment and Plan: 1. Unspecified essential hypertension - CBC with Differential - BASIC METABOLIC PANEL WITH GFR - Hepatic function panel - TSH - EKG 12-Lead - Urinalysis, Routine w reflex microscopic - Microalbumin / creatinine urine ratio  2. Reflux esophagitis Continue meds  3. Nephrolithiasis Increase fluids  4. Mixed hyperlipidemia -continue medications, check lipids, decrease fatty foods, increase activity. - Lipid panel  5. Other abnormal glucose Discussed general issues about diabetes pathophysiology and management., Educational material distributed., Suggested low cholesterol diet., Encouraged aerobic exercise., Discussed foot care., Reminded to get yearly retinal exam. - Hemoglobin A1c  6. Unspecified vitamin D deficiency Continue supplement  7. Encounter for long-term (current) use of other medications - Magnesium  8. Need for prophylactic vaccination against Streptococcus pneumoniae (pneumococcus) Prevnar 13, suggest high dose flu shot   [employer] COLONOSCOPY DEXA 2016 Allergies- uses OTC flonase if helps Yeast under breast/buttocks- nystatin power given  Discussed med's effects and SE's. Screening labs and tests as requested with regular follow-up as recommended.  HPI 73 y.o. female  presents for a complete physical.  Her blood pressure has been controlled at home, today their BP is BP: 122/78 mmHg She does not workout. She denies chest pain, shortness of breath, dizziness.  She is not on cholesterol medication and denies myalgias. Her cholesterol is at goal. The cholesterol last visit was:   Lab Results  Component Value Date   CHOL 177 09/22/2013   HDL 68 09/22/2013   LDLCALC 85 09/22/2013   TRIG 119 09/22/2013   CHOLHDL 2.6 09/22/2013   Last A1C in the office was:  Lab Results  Component Value Date   HGBA1C 5.5 09/22/2013   Patient is on Vitamin D supplement.   She has osteoporosis and is on Evista.  She has a history of  nephrolithiasis and 3 weeks ago she had a similar pain, she called her urologist Dr. Karsten Ro, she took an oxycodone from 4 years ago which helped the pain temporary but she did get nausea with vomiting x1. She believes she has passed the stone and does not have any more left flank pain, nausea, fever, chills, decreased appetite.   Current Medications:  Current Outpatient Prescriptions on File Prior to Visit  Medication Sig Dispense Refill  . aspirin 81 MG tablet Take 81 mg by mouth every other day.      . calcium carbonate (OS-CAL) 600 MG TABS tablet Take 600 mg by mouth daily.      . cholecalciferol (VITAMIN D) 1000 UNITS tablet Take 1,000 Units by mouth 1 day or 1 dose. Take 3 tablets daily.      . clobetasol ointment (TEMOVATE) 0.05 %       . furosemide (LASIX) 40 MG tablet Take 40 mg by mouth. States misses doses and takes otc potassium when she takes Lasix      . metoprolol succinate (TOPROL-XL) 100 MG 24 hr tablet TAKE 1 TABLET (100 MG TOTAL) BY MOUTH DAILY. TAKE WITH OR IMMEDIATELY FOLLOWING A MEAL.  30 tablet  3  . raloxifene (EVISTA) 60 MG tablet Take 60 mg by mouth daily.       No current facility-administered medications on file prior to visit.   Health Maintenance:   Immunization History  Administered Date(s) Administered  . Zoster 12/27/2005   Preventative care:  Last colonoscopy: 2005 due 2015 DUE with Dr. Carlean Purl  Last mammogram: 03/2013  Last pap smear/pelvic exam: remote  DEXA: 03/2013 Osteopenia- had reclast 08,09,11- DUE 2016  Prior vaccinations:  TD or Tdap: 2007  Influenza: suggest high dose flu Prevnar DUE Pneumococcal: 2008  Shingles/Zostavax: 2007   Names of Other Physician/Practitioners you currently use:  1. [facility]- here for primary care  2. Dr. Herbert Deaner, eye doctor, last visit Glasses/contacts, 04/2014 3. , dentist, last visit every 6 months, 12/2013 Patient Care Team:  Unk Pinto, MD as PCP - General  (Internal Medicine)  Johna Sheriff, MD as Consulting Physician (Ophthalmology)  Gatha Mayer, MD as Consulting Physician (Gastroenterology)  Claybon Jabs, MD as Consulting Physician (Urology)  Cheri Fowler, MD as Consulting Physician (Obstetrics and Gynecology)   Allergies:  Allergies  Allergen Reactions  . Amoxicillin     No allergy. States she takes before dentist with no problem  . Fosamax [Alendronate Sodium]     esophagitis   Medical History:  Past Medical History  Diagnosis Date  . Hypertension   . Osteoporosis   . GERD (gastroesophageal reflux disease)   . Nephrolithiasis   . Cataract   . Elevated hemoglobin A1c   . Leiomyoma     adnexal Leiomyoma removal in 1/11   Surgical History:  Past Surgical History  Procedure Laterality Date  . Joint replacement Left total knee   Family History:  Family History  Problem Relation Age of Onset  . Hypertension Mother   . Diabetes Mother   . Breast cancer Mother   . Congestive Heart Failure Mother   . Osteoporosis Father    Social History:  History  Substance Use Topics  . Smoking status: Never Smoker   . Smokeless tobacco: Never Used  . Alcohol Use: No    Review of Systems: [X]  = complains of  [ ]  = denies  General: Fatigue [ ]  Fever [ ]  Chills [ ]  Weakness [ ]   Insomnia [ ] Weight change [ ]  Night sweats [ ]   Change in appetite [ ]  Eyes: Redness [ ]  Blurred vision [ ]  Diplopia [ ]  Discharge [ ]   ENT: Congestion [ ]  Sinus Pain [ ]  Post Nasal Drip [ ]  Sore Throat [ ]  Earache [ ]  hearing loss [ ]  Tinnitus [ ]  Snoring [ ]   Cardiac: Chest pain/pressure [ ]  SOB [ ]  Orthopnea [ ]   Palpitations [ ]   Paroxysmal nocturnal dyspnea[ ]  Claudication [ ]  Edema [ x]  Pulmonary: Cough [ ]  Wheezing[ ]   SOB [ ]   Pleurisy [ ]   GI: Nausea [ ]  Vomiting[ ]  Dysphagia[ ]  Heartburn[ ]  Abdominal pain [ ]  Constipation [ ] ; Diarrhea [ ]  BRBPR [ ]  Melena[ ]  Bloating [ ]  Hemorrhoids [ ]   GU: Hematuria[ ]  Dysuria [ ]  Nocturia[ ]  Urgency [  ]  Hesitancy [ ]  Discharge [ ]  Frequency [ ]   Breast:  Breast lumps [ ]   nipple discharge [ ]    Neuro: Headaches[ ]  Vertigo[ ]  Paresthesias[ ]  Spasm [ ]  Speech changes [ ]  Incoordination [ ]   Ortho: Arthritis [ ]  Joint pain [ ]  Muscle pain [ ]  Joint swelling [ ]  Back Pain [ ]  Skin:  Rash [ x]  Pruritis [ ]  Change in skin lesion [ ]   Psych: Depression[ ]  Anxiety[ ]  Confusion [ ]  Memory loss [ ]   Heme/Lypmh: Bleeding [ ]  Bruising [ ]  Enlarged lymph nodes [ ]   Endocrine: Visual blurring [ ]  Paresthesia [ ]  Polyuria [ ]  Polydypsea [ ]    Heat/cold intolerance [ ]  Hypoglycemia [ ]   Physical Exam: Estimated body mass index is 29.85 kg/(m^2) as calculated  from the following:   Height as of this encounter: 5\' 4"  (1.626 m).   Weight as of this encounter: 174 lb (78.926 kg). BP 122/78  Pulse 72  Temp(Src) 98.6 F (37 C)  Resp 16  Ht 5\' 4"  (1.626 m)  Wt 174 lb (78.926 kg)  BMI 29.85 kg/m2 General Appearance: Well nourished, in no apparent distress. Eyes: PERRLA, EOMs, conjunctiva no swelling or erythema, normal fundi and vessels. Sinuses: No Frontal/maxillary tenderness ENT/Mouth: Ext aud canals clear, normal light reflex with TMs without erythema, bulging.  Good dentition. No erythema, swelling, or exudate on post pharynx. Tonsils not swollen or erythematous. Hearing normal.  Neck: Supple, thyroid normal. No bruits Respiratory: Respiratory effort normal, BS equal bilaterally without rales, rhonchi, wheezing or stridor. Cardio: RRR without murmurs, rubs or gallops. Brisk peripheral pulses with bilateral 2+ edema with erythema, no weeping Chest: symmetric, with normal excursions and percussion. Breasts: defer Abdomen: Soft, +BS. Non tender, no guarding, rebound, hernias, masses, or organomegaly. .  Lymphatics: Non tender without lymphadenopathy.  Genitourinary: defer Musculoskeletal: Full ROM all peripheral extremities,5/5 strength, and normal gait. Skin: Warm, dry without lesions, ecchymosis.   Neuro: Cranial nerves intact, reflexes equal bilaterally. Normal muscle tone, no cerebellar symptoms. Sensation intact.  Psych: Awake and oriented X 3, normal affect, Insight and Judgment appropriate.   EKG: WNL no changes.   Vicie Mutters 10:07 AM

## 2014-03-25 NOTE — Patient Instructions (Signed)

## 2014-03-26 LAB — LIPID PANEL
Cholesterol: 137 mg/dL (ref 0–200)
HDL: 56 mg/dL (ref >39)
LDL Cholesterol: 56 mg/dL (ref 0–99)
Total CHOL/HDL Ratio: 2.4 Ratio
Triglycerides: 125 mg/dL (ref <150)
VLDL: 25 mg/dL (ref 0–40)

## 2014-03-26 LAB — URINALYSIS, MICROSCOPIC ONLY
Bacteria, UA: NONE SEEN
Casts: NONE SEEN
Crystals: NONE SEEN
Squamous Epithelial / LPF: NONE SEEN

## 2014-03-26 LAB — TSH: TSH: 1.072 u[IU]/mL (ref 0.350–4.500)

## 2014-03-26 LAB — BASIC METABOLIC PANEL WITH GFR
BUN: 19 mg/dL (ref 6–23)
CO2: 28 mEq/L (ref 19–32)
Calcium: 9.4 mg/dL (ref 8.4–10.5)
Chloride: 103 mEq/L (ref 96–112)
Creat: 0.94 mg/dL (ref 0.50–1.10)
GFR, Est African American: 69 mL/min
GFR, Est Non African American: 60 mL/min
Glucose, Bld: 90 mg/dL (ref 70–99)
Potassium: 4.1 mEq/L (ref 3.5–5.3)
Sodium: 140 mEq/L (ref 135–145)

## 2014-03-26 LAB — MICROALBUMIN / CREATININE URINE RATIO
Creatinine, Urine: 201.6 mg/dL
Microalb Creat Ratio: 17.8 mg/g (ref 0.0–30.0)
Microalb, Ur: 3.58 mg/dL — ABNORMAL HIGH (ref 0.00–1.89)

## 2014-03-26 LAB — URINALYSIS, ROUTINE W REFLEX MICROSCOPIC
Bilirubin Urine: NEGATIVE
Glucose, UA: NEGATIVE mg/dL
Hgb urine dipstick: NEGATIVE
Ketones, ur: NEGATIVE mg/dL
Nitrite: NEGATIVE
Protein, ur: NEGATIVE mg/dL
Specific Gravity, Urine: 1.02 (ref 1.005–1.030)
Urobilinogen, UA: 0.2 mg/dL (ref 0.0–1.0)
pH: 5 (ref 5.0–8.0)

## 2014-03-26 LAB — HEPATIC FUNCTION PANEL
ALT: 11 U/L (ref 0–35)
AST: 16 U/L (ref 0–37)
Albumin: 3.8 g/dL (ref 3.5–5.2)
Alkaline Phosphatase: 64 U/L (ref 39–117)
Bilirubin, Direct: 0.1 mg/dL (ref 0.0–0.3)
Indirect Bilirubin: 0.5 mg/dL (ref 0.2–1.2)
Total Bilirubin: 0.6 mg/dL (ref 0.2–1.2)
Total Protein: 6.9 g/dL (ref 6.0–8.3)

## 2014-03-26 LAB — MAGNESIUM: Magnesium: 1.6 mg/dL (ref 1.5–2.5)

## 2014-04-14 DIAGNOSIS — Z1231 Encounter for screening mammogram for malignant neoplasm of breast: Secondary | ICD-10-CM | POA: Diagnosis not present

## 2014-04-14 DIAGNOSIS — Z853 Personal history of malignant neoplasm of breast: Secondary | ICD-10-CM | POA: Diagnosis not present

## 2014-04-22 DIAGNOSIS — N6009 Solitary cyst of unspecified breast: Secondary | ICD-10-CM | POA: Diagnosis not present

## 2014-04-28 ENCOUNTER — Other Ambulatory Visit: Payer: Self-pay | Admitting: Physician Assistant

## 2014-04-28 ENCOUNTER — Other Ambulatory Visit: Payer: Self-pay

## 2014-04-28 MED ORDER — METOPROLOL SUCCINATE ER 100 MG PO TB24: ORAL_TABLET | ORAL | Status: DC

## 2014-05-05 DIAGNOSIS — H2513 Age-related nuclear cataract, bilateral: Secondary | ICD-10-CM | POA: Diagnosis not present

## 2014-05-05 DIAGNOSIS — H524 Presbyopia: Secondary | ICD-10-CM | POA: Diagnosis not present

## 2014-05-05 DIAGNOSIS — H04123 Dry eye syndrome of bilateral lacrimal glands: Secondary | ICD-10-CM | POA: Diagnosis not present

## 2014-05-05 DIAGNOSIS — H25013 Cortical age-related cataract, bilateral: Secondary | ICD-10-CM | POA: Diagnosis not present

## 2014-05-14 ENCOUNTER — Other Ambulatory Visit: Payer: Self-pay | Admitting: Physician Assistant

## 2014-06-21 ENCOUNTER — Encounter: Payer: Self-pay | Admitting: *Deleted

## 2014-07-13 ENCOUNTER — Encounter: Payer: Self-pay | Admitting: Internal Medicine

## 2014-10-08 ENCOUNTER — Other Ambulatory Visit: Payer: Self-pay | Admitting: Physician Assistant

## 2014-11-03 ENCOUNTER — Encounter: Payer: Self-pay | Admitting: Internal Medicine

## 2015-02-08 ENCOUNTER — Other Ambulatory Visit: Payer: Self-pay | Admitting: Internal Medicine

## 2015-03-30 ENCOUNTER — Encounter: Payer: Self-pay | Admitting: Physician Assistant

## 2015-03-30 ENCOUNTER — Ambulatory Visit (INDEPENDENT_AMBULATORY_CARE_PROVIDER_SITE_OTHER): Payer: Medicare Other | Admitting: Physician Assistant

## 2015-03-30 VITALS — BP 130/82 | HR 72 | Temp 97.5°F | Resp 16 | Ht 64.0 in | Wt 179.6 lb

## 2015-03-30 DIAGNOSIS — N200 Calculus of kidney: Secondary | ICD-10-CM

## 2015-03-30 DIAGNOSIS — E669 Obesity, unspecified: Secondary | ICD-10-CM | POA: Insufficient documentation

## 2015-03-30 DIAGNOSIS — Z23 Encounter for immunization: Secondary | ICD-10-CM

## 2015-03-30 DIAGNOSIS — Z789 Other specified health status: Secondary | ICD-10-CM

## 2015-03-30 DIAGNOSIS — I10 Essential (primary) hypertension: Secondary | ICD-10-CM

## 2015-03-30 DIAGNOSIS — R6889 Other general symptoms and signs: Secondary | ICD-10-CM

## 2015-03-30 DIAGNOSIS — Z0001 Encounter for general adult medical examination with abnormal findings: Secondary | ICD-10-CM | POA: Diagnosis not present

## 2015-03-30 DIAGNOSIS — E559 Vitamin D deficiency, unspecified: Secondary | ICD-10-CM

## 2015-03-30 DIAGNOSIS — M25562 Pain in left knee: Secondary | ICD-10-CM

## 2015-03-30 DIAGNOSIS — M25561 Pain in right knee: Secondary | ICD-10-CM

## 2015-03-30 DIAGNOSIS — E66811 Obesity, class 1: Secondary | ICD-10-CM | POA: Insufficient documentation

## 2015-03-30 DIAGNOSIS — Z79899 Other long term (current) drug therapy: Secondary | ICD-10-CM | POA: Insufficient documentation

## 2015-03-30 DIAGNOSIS — K2100 Gastro-esophageal reflux disease with esophagitis, without bleeding: Secondary | ICD-10-CM

## 2015-03-30 DIAGNOSIS — M810 Age-related osteoporosis without current pathological fracture: Secondary | ICD-10-CM

## 2015-03-30 DIAGNOSIS — R7309 Other abnormal glucose: Secondary | ICD-10-CM | POA: Diagnosis not present

## 2015-03-30 DIAGNOSIS — E782 Mixed hyperlipidemia: Secondary | ICD-10-CM | POA: Diagnosis not present

## 2015-03-30 DIAGNOSIS — R7303 Prediabetes: Secondary | ICD-10-CM

## 2015-03-30 DIAGNOSIS — Z6830 Body mass index (BMI) 30.0-30.9, adult: Secondary | ICD-10-CM

## 2015-03-30 DIAGNOSIS — Z0000 Encounter for general adult medical examination without abnormal findings: Secondary | ICD-10-CM

## 2015-03-30 DIAGNOSIS — Z9181 History of falling: Secondary | ICD-10-CM

## 2015-03-30 LAB — CBC WITH DIFFERENTIAL/PLATELET
Basophils Absolute: 0.1 10*3/uL (ref 0.0–0.1)
Basophils Relative: 1 % (ref 0–1)
Eosinophils Absolute: 0.3 10*3/uL (ref 0.0–0.7)
Eosinophils Relative: 4 % (ref 0–5)
HCT: 43.2 % (ref 36.0–46.0)
Hemoglobin: 14.3 g/dL (ref 12.0–15.0)
Lymphocytes Relative: 32 % (ref 12–46)
Lymphs Abs: 2.5 10*3/uL (ref 0.7–4.0)
MCH: 27.9 pg (ref 26.0–34.0)
MCHC: 33.1 g/dL (ref 30.0–36.0)
MCV: 84.2 fL (ref 78.0–100.0)
MPV: 10.4 fL (ref 8.6–12.4)
Monocytes Absolute: 0.7 10*3/uL (ref 0.1–1.0)
Monocytes Relative: 9 % (ref 3–12)
Neutro Abs: 4.2 10*3/uL (ref 1.7–7.7)
Neutrophils Relative %: 54 % (ref 43–77)
Platelets: 309 10*3/uL (ref 150–400)
RBC: 5.13 MIL/uL — ABNORMAL HIGH (ref 3.87–5.11)
RDW: 14.9 % (ref 11.5–15.5)
WBC: 7.8 10*3/uL (ref 4.0–10.5)

## 2015-03-30 LAB — HEMOGLOBIN A1C
Hgb A1c MFr Bld: 5.5 % (ref <5.7)
Mean Plasma Glucose: 111 mg/dL (ref <117)

## 2015-03-30 NOTE — Patient Instructions (Addendum)
Get colonoscopy Get eye doctor appointment Get Mammogram and bone density  Preventive Care for Adults A healthy lifestyle and preventive care can promote health and wellness. Preventive health guidelines for women include the following key practices.  A routine yearly physical is a good way to check with your health care provider about your health and preventive screening. It is a chance to share any concerns and updates on your health and to receive a thorough exam.  Visit your dentist for a routine exam and preventive care every 6 months. Brush your teeth twice a day and floss once a day. Good oral hygiene prevents tooth decay and gum disease.  The frequency of eye exams is based on your age, health, family medical history, use of contact lenses, and other factors. Follow your health care provider's recommendations for frequency of eye exams.  Eat a healthy diet. Foods like vegetables, fruits, whole grains, low-fat dairy products, and lean protein foods contain the nutrients you need without too many calories. Decrease your intake of foods high in solid fats, added sugars, and salt. Eat the right amount of calories for you.Get information about a proper diet from your health care provider, if necessary.  Regular physical exercise is one of the most important things you can do for your health. Most adults should get at least 150 minutes of moderate-intensity exercise (any activity that increases your heart rate and causes you to sweat) each week. In addition, most adults need muscle-strengthening exercises on 2 or more days a week.  Maintain a healthy weight. The body mass index (BMI) is a screening tool to identify possible weight problems. It provides an estimate of body fat based on height and weight. Your health care provider can find your BMI and can help you achieve or maintain a healthy weight.For adults 20 years and older:  A BMI below 18.5 is considered underweight.  A BMI of 18.5 to  24.9 is normal.  A BMI of 25 to 29.9 is considered overweight.  A BMI of 30 and above is considered obese.  Maintain normal blood lipids and cholesterol levels by exercising and minimizing your intake of saturated fat. Eat a balanced diet with plenty of fruit and vegetables. If your lipid or cholesterol levels are high, you are over 50, or you are at high risk for heart disease, you may need your cholesterol levels checked more frequently.Ongoing high lipid and cholesterol levels should be treated with medicines if diet and exercise are not working.  If you smoke, find out from your health care provider how to quit. If you do not use tobacco, do not start.  Lung cancer screening is recommended for adults aged 93-80 years who are at high risk for developing lung cancer because of a history of smoking. A yearly low-dose CT scan of the lungs is recommended for people who have at least a 30-pack-year history of smoking and are a current smoker or have quit within the past 15 years. A pack year of smoking is smoking an average of 1 pack of cigarettes a day for 1 year (for example: 1 pack a day for 30 years or 2 packs a day for 15 years). Yearly screening should continue until the smoker has stopped smoking for at least 15 years. Yearly screening should be stopped for people who develop a health problem that would prevent them from having lung cancer treatment.  Avoid use of street drugs. Do not share needles with anyone. Ask for help if you need  support or instructions about stopping the use of drugs.  High blood pressure causes heart disease and increases the risk of stroke.  Ongoing high blood pressure should be treated with medicines if weight loss and exercise do not work.  If you are 45-59 years old, ask your health care provider if you should take aspirin to prevent strokes.  Diabetes screening involves taking a blood sample to check your fasting blood sugar level. This should be done once every  3 years, after age 4, if you are within normal weight and without risk factors for diabetes. Testing should be considered at a younger age or be carried out more frequently if you are overweight and have at least 1 risk factor for diabetes.  Breast cancer screening is essential preventive care for women. You should practice "breast self-awareness." This means understanding the normal appearance and feel of your breasts and may include breast self-examination. Any changes detected, no matter how small, should be reported to a health care provider. Women in their 78s and 30s should have a clinical breast exam (CBE) by a health care provider as part of a regular health exam every 1 to 3 years. After age 76, women should have a CBE every year. Starting at age 15, women should consider having a mammogram (breast X-ray test) every year. Women who have a family history of breast cancer should talk to their health care provider about genetic screening. Women at a high risk of breast cancer should talk to their health care providers about having an MRI and a mammogram every year.  Breast cancer gene (BRCA)-related cancer risk assessment is recommended for women who have family members with BRCA-related cancers. BRCA-related cancers include breast, ovarian, tubal, and peritoneal cancers. Having family members with these cancers may be associated with an increased risk for harmful changes (mutations) in the breast cancer genes BRCA1 and BRCA2. Results of the assessment will determine the need for genetic counseling and BRCA1 and BRCA2 testing.  Routine pelvic exams to screen for cancer are no longer recommended for nonpregnant women who are considered low risk for cancer of the pelvic organs (ovaries, uterus, and vagina) and who do not have symptoms. Ask your health care provider if a screening pelvic exam is right for you.  If you have had past treatment for cervical cancer or a condition that could lead to cancer,  you need Pap tests and screening for cancer for at least 20 years after your treatment. If Pap tests have been discontinued, your risk factors (such as having a new sexual partner) need to be reassessed to determine if screening should be resumed. Some women have medical problems that increase the chance of getting cervical cancer. In these cases, your health care provider may recommend more frequent screening and Pap tests.    Colorectal cancer can be detected and often prevented. Most routine colorectal cancer screening begins at the age of 75 years and continues through age 23 years. However, your health care provider may recommend screening at an earlier age if you have risk factors for colon cancer. On a yearly basis, your health care provider may provide home test kits to check for hidden blood in the stool. Use of a small camera at the end of a tube, to directly examine the colon (sigmoidoscopy or colonoscopy), can detect the earliest forms of colorectal cancer. Talk to your health care provider about this at age 98, when routine screening begins. Direct exam of the colon should be repeated every  5-10 years through age 109 years, unless early forms of pre-cancerous polyps or small growths are found.  Osteoporosis is a disease in which the bones lose minerals and strength with aging. This can result in serious bone fractures or breaks. The risk of osteoporosis can be identified using a bone density scan. Women ages 82 years and over and women at risk for fractures or osteoporosis should discuss screening with their health care providers. Ask your health care provider whether you should take a calcium supplement or vitamin D to reduce the rate of osteoporosis.  Menopause can be associated with physical symptoms and risks. Hormone replacement therapy is available to decrease symptoms and risks. You should talk to your health care provider about whether hormone replacement therapy is right for you.  Use  sunscreen. Apply sunscreen liberally and repeatedly throughout the day. You should seek shade when your shadow is shorter than you. Protect yourself by wearing long sleeves, pants, a wide-brimmed hat, and sunglasses year round, whenever you are outdoors.  Once a month, do a whole body skin exam, using a mirror to look at the skin on your back. Tell your health care provider of new moles, moles that have irregular borders, moles that are larger than a pencil eraser, or moles that have changed in shape or color.  Stay current with required vaccines (immunizations).  Influenza vaccine. All adults should be immunized every year.  Tetanus, diphtheria, and acellular pertussis (Td, Tdap) vaccine. Pregnant women should receive 1 dose of Tdap vaccine during each pregnancy. The dose should be obtained regardless of the length of time since the last dose. Immunization is preferred during the 27th-36th week of gestation. An adult who has not previously received Tdap or who does not know her vaccine status should receive 1 dose of Tdap. This initial dose should be followed by tetanus and diphtheria toxoids (Td) booster doses every 10 years. Adults with an unknown or incomplete history of completing a 3-dose immunization series with Td-containing vaccines should begin or complete a primary immunization series including a Tdap dose. Adults should receive a Td booster every 10 years.    Zoster vaccine. One dose is recommended for adults aged 31 years or older unless certain conditions are present.    Pneumococcal 13-valent conjugate (PCV13) vaccine. When indicated, a person who is uncertain of her immunization history and has no record of immunization should receive the PCV13 vaccine. An adult aged 2 years or older who has certain medical conditions and has not been previously immunized should receive 1 dose of PCV13 vaccine. This PCV13 should be followed with a dose of pneumococcal polysaccharide (PPSV23) vaccine.  The PPSV23 vaccine dose should be obtained at least 8 weeks after the dose of PCV13 vaccine. An adult aged 17 years or older who has certain medical conditions and previously received 1 or more doses of PPSV23 vaccine should receive 1 dose of PCV13. The PCV13 vaccine dose should be obtained 1 or more years after the last PPSV23 vaccine dose.    Pneumococcal polysaccharide (PPSV23) vaccine. When PCV13 is also indicated, PCV13 should be obtained first. All adults aged 50 years and older should be immunized. An adult younger than age 60 years who has certain medical conditions should be immunized. Any person who resides in a nursing home or long-term care facility should be immunized. An adult smoker should be immunized. People with an immunocompromised condition and certain other conditions should receive both PCV13 and PPSV23 vaccines. People with human immunodeficiency virus (HIV)  infection should be immunized as soon as possible after diagnosis. Immunization during chemotherapy or radiation therapy should be avoided. Routine use of PPSV23 vaccine is not recommended for American Indians, [place]atives, or people younger than 65 years unless there are medical conditions that require PPSV23 vaccine. When indicated, people who have unknown immunization and have no record of immunization should receive PPSV23 vaccine. One-time revaccination 5 years after the first dose of PPSV23 is recommended for people aged 19-64 years who have chronic kidney failure, nephrotic syndrome, asplenia, or immunocompromised conditions. People who received 1-2 doses of PPSV23 before age 23 years should receive another dose of PPSV23 vaccine at age 28 years or later if at least 5 years have passed since the previous dose. Doses of PPSV23 are not needed for people immunized with PPSV23 at or after age 72 years.   Preventive Services / Frequency  Ages 30 years and over  Blood pressure check.  Lipid and cholesterol check.  Lung  cancer screening. / Every year if you are aged 43-80 years and have a 30-pack-year history of smoking and currently smoke or have quit within the past 15 years. Yearly screening is stopped once you have quit smoking for at least 15 years or develop a health problem that would prevent you from having lung cancer treatment.  Clinical breast exam.** / Every year after age 68 years.  BRCA-related cancer risk assessment.** / For women who have family members with a BRCA-related cancer (breast, ovarian, tubal, or peritoneal cancers).  Mammogram.** / Every year beginning at age 34 years and continuing for as long as you are in good health. Consult with your health care provider.  Pap test.** / Every 3 years starting at age 66 years through age 56 or 60 years with 3 consecutive normal Pap tests. Testing can be stopped between 65 and 70 years with 3 consecutive normal Pap tests and no abnormal Pap or HPV tests in the past 10 years.  Fecal occult blood test (FOBT) of stool. / Every year beginning at age 35 years and continuing until age 12 years. You may not need to do this test if you get a colonoscopy every 10 years.  Flexible sigmoidoscopy or colonoscopy.** / Every 5 years for a flexible sigmoidoscopy or every 10 years for a colonoscopy beginning at age 90 years and continuing until age 73 years.  Hepatitis C blood test.** / For all people born from [birth year] through 1966 and any individual with known risks for hepatitis C.  Osteoporosis screening.** / A one-time screening for women ages 87 years and over and women at risk for fractures or osteoporosis.  Skin self-exam. / Monthly.  Influenza vaccine. / Every year.  Tetanus, diphtheria, and acellular pertussis (Tdap/Td) vaccine.** / 1 dose of Td every 10 years.  Zoster vaccine.** / 1 dose for adults aged 82 years or older.  Pneumococcal 13-valent conjugate (PCV13) vaccine.** / Consult your health care provider.  Pneumococcal polysaccharide (PPSV23)  vaccine.** / 1 dose for all adults aged 57 years and older. Screening for abdominal aortic aneurysm (AAA)  by ultrasound is recommended for people who have history of high blood pressure or who are current or former smokers.

## 2015-03-30 NOTE — Progress Notes (Signed)
Medicare wellness  Assessment:   1. Essential hypertension - continue medications, DASH diet, exercise and monitor at home. Call if greater than 130/80.  - CBC with Differential/Platelet - BASIC METABOLIC PANEL WITH GFR - Hepatic function panel - TSH - Urinalysis, Routine w reflex microscopic (not at [facility]) - Microalbumin / creatinine urine ratio - EKG 12-Lead  2. Mixed hyperlipidemia -continue medications, check lipids, decrease fatty foods, increase activity.  - Lipid panel  3. Prediabetes Discussed general issues about diabetes pathophysiology and management., Educational material distributed., Suggested low cholesterol diet., Encouraged aerobic exercise., Discussed foot care., Reminded to get yearly retinal exam. - Hemoglobin A1c - Insulin, fasting - HM DIABETES FOOT EXAM  4. Osteoporosis Continue CA and vitamin D, get DEXA - DG Bone Density; Future  5. Vitamin D deficiency - Vit D  25 hydroxy (rtn osteoporosis monitoring)  6. Medication management - Magnesium  7. Nephrolithiasis Increase fluids  8. Reflux esophagitis Continue PPI/H2 blocker, diet discussed  9. Obesity Obesity with co morbidities- long discussion about weight loss, diet, and exercise  10. 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 Referral to SW: yes  Conditions/risks identified: BMI: Discussed weight loss, diet, and increase physical activity.  Increase physical activity: AHA recommends 150 minutes of physical activity a week.  Medications reviewed DEXA- ordered Urinary Incontinence is not an issue: discussed non pharmacology and pharmacology options.  Fall risk: low- discussed PT, home fall assessment, medications.   Subjective:   Kimberly Watson is a 74 y.o. female who presents for Medicare Annual Wellness Visit and 3 month follow up on hypertension, prediabetes, hyperlipidemia, vitamin D def.  Date of last medicare wellness visit was 08/2013  Her blood pressure has been controlled at home, today their BP is BP: 130/82 mmHg  She does not work out. She has been complaining of right knee pain, will lock up on her, have pain worse with standing, bending. Had left knee replacement, wants to see [facility]. 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  She denies chest pain, shortness of breath, dizziness.  Her cholesterol is diet controlled.  Her cholesterol is controlled. The cholesterol last visit was:   Lab Results  Component Value Date   CHOL 137 03/25/2014   HDL 56 03/25/2014   LDLCALC 56 03/25/2014   TRIG 125 03/25/2014   CHOLHDL 2.4 03/25/2014   She has been working on diet and exercise for prediabetes, and has no polyuria or polydipsia, no chest pain, dyspnea or TIAs, no numbness, tingling or pain in extremities. Last A1C in the office was:  Lab Results  Component Value Date   HGBA1C 6.0* 03/25/2014   Patient is on Vitamin D supplement.  She is on osteoporosis, on  Evista.  She has history of nephrolithiasis, follows with Dr. Karsten Ro.  She also states that her brother just lost her daughter in [place] 2 weeks ago.  BMI is Body mass  index is 30.81 kg/(m^2)., she is working on diet and exercise. Wt Readings from Last 3 Encounters:  03/30/15 179 lb 9.6 oz (81.466 kg)  03/25/14 174 lb (78.926 kg)  09/22/13 173 lb (78.472 kg)    Names of Other Physician/Practitioners you currently use: 1. [facility]- here for primary care 2. Dr. Herbert Deaner, eye doctor, last visit Glasses/contacts, 11/2012 3. , dentist, last visit every 6 months, 06/2013 Patient Care Team: Unk Pinto, MD as PCP - General (Internal Medicine) Monna Fam, MD as Consulting Physician (Ophthalmology) Gatha Mayer, MD as Consulting Physician (Gastroenterology) Kathie Rhodes, MD as Consulting Physician (Urology) Cheri Fowler, MD as Consulting Physician (Obstetrics and Gynecology)  Medication Review Current Outpatient Prescriptions on File Prior to Visit  Medication Sig Dispense Refill  . aspirin 81 MG tablet Take 81 mg by mouth every other day.    . calcium carbonate (OS-CAL) 600 MG TABS tablet Take 600 mg by mouth daily.    . cholecalciferol (VITAMIN D) 1000 UNITS tablet Take 1,000 Units by mouth 1 day or 1 dose. Take 3 tablets daily.    . clobetasol ointment (TEMOVATE) 0.05 % Apply topically 2 (two) times daily. 45 g 1  . furosemide (LASIX) 40 MG tablet Take 40 mg by mouth. States misses doses and takes otc potassium when she takes Lasix    . metoprolol succinate (TOPROL-XL) 100 MG 24 hr tablet TAKE 1 TABLET (100 MG TOTAL) BY MOUTH DAILY. TAKE WITH OR IMMEDIATELY FOLLOWING A MEAL. 30 tablet 3  . nystatin (MYCOSTATIN/NYSTOP) 100000 UNIT/GM POWD Apply to affected area after a shower daily or as needed 60 g 2  . raloxifene (EVISTA) 60 MG tablet TAKE 1 TABLET EVERY DAY 30 tablet 11   No current facility-administered medications on file prior to visit.    Current Problems (verified) Patient Active Problem List   Diagnosis Date Noted  . Osteoporosis 03/30/2015  . Medication management 03/30/2015  . Obesity  03/30/2015  . Essential hypertension 06/18/2013  . Mixed hyperlipidemia 06/18/2013  . Prediabetes 06/18/2013  . Vitamin D deficiency 06/18/2013  . Reflux esophagitis 06/18/2013  . Nephrolithiasis 06/18/2013   Screening Tests Health Maintenance  Topic Date Due  . COLONOSCOPY  06/02/2014  . INFLUENZA VACCINE  03/01/2015  . PNA vac Low Risk Adult (2 of 2 - PPSV23) 03/26/2015  . TETANUS/TDAP  08/01/2015  . DEXA SCAN  Completed  . ZOSTAVAX  Completed    Health Maintenance Topics with due status: Due On     Topic Date Due   INFLUENZA VACCINE 03/01/2015   PNA vac Low Risk Adult 03/26/2015   Health Maintenance Topics with due status: Overdue     Topic Date Due   COLONOSCOPY 06/02/2014    Immunization History  Administered Date(s) Administered  . Pneumococcal Conjugate-13 03/25/2014  . Zoster 12/27/2005    Preventative care: Last colonoscopy: 2005 due 2015, never got due to father passing Last mammogram: 03/2014, with subsequent [place] of right breast Last pap smear/pelvic exam: remote DEXA: 03/2013 Osteopenia- had (reclast [phone]) DUE  Prior vaccinations: TD or Tdap: 2007 will get this year, works in her garden with cuts  Influenza: declines  Prevnar 13: 2015 Pneumococcal: 2008 Shingles/Zostavax: 2007  Allergies Allergies  Allergen Reactions  . Amoxicillin     No allergy. States  she takes before dentist with no problem  . Fosamax [Alendronate Sodium]     esophagitis   Surgical history Past Surgical History  Procedure Laterality Date  . Joint replacement Left total knee   Family history Family History  Problem Relation Age of Onset  . Hypertension Mother   . Diabetes Mother   . Breast cancer Mother   . Congestive Heart Failure Mother   . Osteoporosis Father    Tobacco History  Smoking status  . Never Smoker   Smokeless tobacco  . Never Used   MEDICARE WELLNESS OBJECTIVES: Tobacco use: She does not smoke.  Patient is not a former smoker. Alcohol  Current alcohol use: none Osteoporosis: postmenopausal estrogen deficiency and dietary calcium and/or vitamin D deficiency, History of fracture in the past year: no Fall risk: Low Risk Hearing: normal Visual acuity: impaired,  does not perform annual eye exam Diet: in general, a "healthy" diet   Physical activity: Current Exercise Habits:: The patient does not participate in regular exercise at present Cardiac risk factors: Cardiac Risk Factors include: advanced age (>51men, >4 women);dyslipidemia;obesity (BMI >30kg/m2);sedentary lifestyle;hypertension Depression/mood screen:   Depression screen [facility] 2/9 03/30/2015  Decreased Interest 0  Down, Depressed, Hopeless 0  PHQ - 2 Score 0    ADLs:  In your present state of health, do you have any difficulty performing the following activities: 03/30/2015  Hearing? N  Vision? Y  Difficulty concentrating or making decisions? N  Walking or climbing stairs? N  Dressing or bathing? N  Doing errands, shopping? N  Preparing Food and eating ? N  Using the Toilet? N  In the past six months, have you accidently leaked urine? N  Do you have problems with loss of bowel control? N  Managing your Medications? N  Managing your Finances? N  Housekeeping or managing your Housekeeping? N     Cognitive Testing  Alert? Yes  Normal Appearance?Yes  Oriented to person? Yes  Place? Yes   Time? Yes  Recall of three objects?  Yes  Can perform simple calculations? Yes  Displays appropriate judgment?Yes  Can read the correct time from a watch face?Yes  EOL planning: Does patient have an advance directive?: No Would patient like information on creating an advanced directive?: Yes - Educational materials given  Review of Systems  Constitutional: Negative.   HENT: Positive for congestion. Negative for ear discharge, ear pain, hearing loss, nosebleeds, sore throat and tinnitus.   Eyes: Negative.   Respiratory: Positive for shortness of breath (with exertion on  incline, no CP, dizziness, diaphoresis, and recovers quickly). Negative for cough, hemoptysis, sputum production, wheezing and stridor.   Cardiovascular: Negative.   Gastrointestinal: Positive for heartburn. Negative for nausea, vomiting, abdominal pain, diarrhea, constipation, blood in stool and melena.  Genitourinary: Negative.   Musculoskeletal: Positive for joint pain (right knee). Negative for myalgias, back pain, falls and neck pain.  Skin: Negative.   Neurological: Negative.  Negative for headaches.  Psychiatric/Behavioral: Negative.      Objective:   Blood pressure 130/82, pulse 72, temperature 97.5 F (36.4 C), resp. rate 16, height 5\' 4"  (1.626 m), weight 179 lb 9.6 oz (81.466 kg). Body mass index is 30.81 kg/(m^2).  General appearance: alert, no distress, WD/WN,  female HEENT: normocephalic, sclerae anicteric, TMs pearly, nares patent, no discharge or erythema, pharynx normal Oral cavity: MMM, no lesions Neck: supple, no lymphadenopathy, no thyromegaly, no masses Heart: RRR, normal S1, S2, no murmurs Lungs: CTA bilaterally, no wheezes, rhonchi, or rales Abdomen: +  bs, soft, non tender, non distended, no masses, no hepatomegaly, no splenomegaly Musculoskeletal: nontender, no swelling, no obvious deformity, Patient is able to ambulate well.  Gait is  antalgic. right knee with lateral joint line tenderness and positive Mc[place]ray no effusion, no erythema, ACL stable, MCL stable and LCL stable Extremities: no edema, no cyanosis, no clubbing Pulses: 2+ symmetric, upper and lower extremities, normal cap refill Neurological: alert, oriented x 3, CN2-12 intact, strength normal upper extremities and lower extremities, sensation normal throughout, DTRs 2+ throughout, no cerebellar signs, gait normal Psychiatric: normal affect, behavior normal, pleasant  Breast: nontender, no masses or lumps, no skin changes, no nipple discharge or inversion, no axillary lymphadenopathy Gyn: defer   Rectal: defer  Medicare Attestation I have personally reviewed: The patient's medical and social history Their use of alcohol, tobacco or illicit drugs Their current medications and supplements The patient's functional ability including ADLs,fall risks, home safety risks, cognitive, and hearing and visual impairment Diet and physical activities Evidence for depression or mood disorders  The patient's weight, height, BMI, and visual acuity have been recorded in the chart.  I have made referrals, counseling, and provided education to the patient based on review of the above and I have provided the patient with a written personalized care plan for preventive services.     Vicie Mutters, PA-C   03/30/2015

## 2015-03-31 LAB — URINALYSIS, ROUTINE W REFLEX MICROSCOPIC
Bilirubin Urine: NEGATIVE
Glucose, UA: NEGATIVE
Hgb urine dipstick: NEGATIVE
Ketones, ur: NEGATIVE
Nitrite: NEGATIVE
Protein, ur: NEGATIVE
Specific Gravity, Urine: 1.021 (ref 1.001–1.035)
pH: 5 (ref 5.0–8.0)

## 2015-03-31 LAB — LIPID PANEL
Cholesterol: 167 mg/dL (ref 125–200)
HDL: 67 mg/dL (ref >=46)
LDL Cholesterol: 78 mg/dL (ref <130)
Total CHOL/HDL Ratio: 2.5 Ratio (ref <=5.0)
Triglycerides: 108 mg/dL (ref <150)
VLDL: 22 mg/dL (ref <30)

## 2015-03-31 LAB — BASIC METABOLIC PANEL WITH GFR
BUN: 17 mg/dL (ref 7–25)
CO2: 27 mmol/L (ref 20–31)
Calcium: 10.1 mg/dL (ref 8.6–10.4)
Chloride: 101 mmol/L (ref 98–110)
Creat: 0.88 mg/dL (ref 0.60–0.93)
GFR, Est African American: 74 mL/min (ref >=60)
GFR, Est Non African American: 64 mL/min (ref >=60)
Glucose, Bld: 90 mg/dL (ref 65–99)
Potassium: 4.4 mmol/L (ref 3.5–5.3)
Sodium: 139 mmol/L (ref 135–146)

## 2015-03-31 LAB — INSULIN, FASTING: Insulin fasting, serum: 7.4 u[IU]/mL (ref 2.0–19.6)

## 2015-03-31 LAB — URINALYSIS, MICROSCOPIC ONLY
Bacteria, UA: NONE SEEN [HPF]
Casts: NONE SEEN [LPF]
Yeast: NONE SEEN [HPF]

## 2015-03-31 LAB — VITAMIN D 25 HYDROXY (VIT D DEFICIENCY, FRACTURES): Vit D, 25-Hydroxy: 43 ng/mL (ref 30–100)

## 2015-03-31 LAB — MAGNESIUM: Magnesium: 1.9 mg/dL (ref 1.5–2.5)

## 2015-03-31 LAB — HEPATIC FUNCTION PANEL
ALT: 11 U/L (ref 6–29)
AST: 16 U/L (ref 10–35)
Albumin: 4 g/dL (ref 3.6–5.1)
Alkaline Phosphatase: 76 U/L (ref 33–130)
Bilirubin, Direct: 0.2 mg/dL (ref <=0.2)
Indirect Bilirubin: 0.5 mg/dL (ref 0.2–1.2)
Total Bilirubin: 0.7 mg/dL (ref 0.2–1.2)
Total Protein: 7.3 g/dL (ref 6.1–8.1)

## 2015-03-31 LAB — MICROALBUMIN / CREATININE URINE RATIO
Creatinine, Urine: 169.3 mg/dL
Microalb Creat Ratio: 8.9 mg/g (ref 0.0–30.0)
Microalb, Ur: 1.5 mg/dL (ref <2.0)

## 2015-03-31 LAB — TSH: TSH: 1.233 u[IU]/mL (ref 0.350–4.500)

## 2015-04-27 DIAGNOSIS — Z1231 Encounter for screening mammogram for malignant neoplasm of breast: Secondary | ICD-10-CM | POA: Diagnosis not present

## 2015-04-27 DIAGNOSIS — M810 Age-related osteoporosis without current pathological fracture: Secondary | ICD-10-CM | POA: Diagnosis not present

## 2015-05-04 ENCOUNTER — Telehealth: Payer: Self-pay | Admitting: *Deleted

## 2015-05-04 NOTE — Telephone Encounter (Signed)
Patient advised of stable BMD and advised to continue her meds the same per Dr Melford Aase.

## 2015-05-08 ENCOUNTER — Encounter: Payer: Self-pay | Admitting: *Deleted

## 2015-05-11 DIAGNOSIS — H25013 Cortical age-related cataract, bilateral: Secondary | ICD-10-CM | POA: Diagnosis not present

## 2015-05-11 DIAGNOSIS — H1013 Acute atopic conjunctivitis, bilateral: Secondary | ICD-10-CM | POA: Diagnosis not present

## 2015-05-11 DIAGNOSIS — H04123 Dry eye syndrome of bilateral lacrimal glands: Secondary | ICD-10-CM | POA: Diagnosis not present

## 2015-05-11 DIAGNOSIS — H2513 Age-related nuclear cataract, bilateral: Secondary | ICD-10-CM | POA: Diagnosis not present

## 2015-05-28 ENCOUNTER — Other Ambulatory Visit: Payer: Self-pay | Admitting: Physician Assistant

## 2015-06-20 ENCOUNTER — Encounter: Payer: Self-pay | Admitting: *Deleted

## 2015-07-04 ENCOUNTER — Other Ambulatory Visit: Payer: Self-pay | Admitting: Internal Medicine

## 2015-07-13 ENCOUNTER — Other Ambulatory Visit: Payer: Self-pay | Admitting: *Deleted

## 2015-07-13 ENCOUNTER — Encounter: Payer: Self-pay | Admitting: Internal Medicine

## 2015-07-13 ENCOUNTER — Ambulatory Visit (INDEPENDENT_AMBULATORY_CARE_PROVIDER_SITE_OTHER): Payer: Medicare Other | Admitting: Internal Medicine

## 2015-07-13 VITALS — BP 124/90 | HR 72 | Temp 97.3°F | Resp 16 | Ht 64.0 in | Wt 180.2 lb

## 2015-07-13 DIAGNOSIS — E559 Vitamin D deficiency, unspecified: Secondary | ICD-10-CM | POA: Diagnosis not present

## 2015-07-13 DIAGNOSIS — Z1331 Encounter for screening for depression: Secondary | ICD-10-CM

## 2015-07-13 DIAGNOSIS — R7309 Other abnormal glucose: Secondary | ICD-10-CM | POA: Diagnosis not present

## 2015-07-13 DIAGNOSIS — Z79899 Other long term (current) drug therapy: Secondary | ICD-10-CM

## 2015-07-13 DIAGNOSIS — K219 Gastro-esophageal reflux disease without esophagitis: Secondary | ICD-10-CM | POA: Diagnosis not present

## 2015-07-13 DIAGNOSIS — E782 Mixed hyperlipidemia: Secondary | ICD-10-CM

## 2015-07-13 DIAGNOSIS — I10 Essential (primary) hypertension: Secondary | ICD-10-CM | POA: Diagnosis not present

## 2015-07-13 DIAGNOSIS — R7303 Prediabetes: Secondary | ICD-10-CM

## 2015-07-13 DIAGNOSIS — Z6830 Body mass index (BMI) 30.0-30.9, adult: Secondary | ICD-10-CM

## 2015-07-13 DIAGNOSIS — Z1389 Encounter for screening for other disorder: Secondary | ICD-10-CM | POA: Diagnosis not present

## 2015-07-13 LAB — MAGNESIUM: Magnesium: 1.7 mg/dL (ref 1.5–2.5)

## 2015-07-13 LAB — CBC WITH DIFFERENTIAL/PLATELET
Basophils Absolute: 0.1 10*3/uL (ref 0.0–0.1)
Basophils Relative: 1 % (ref 0–1)
Eosinophils Absolute: 0.4 10*3/uL (ref 0.0–0.7)
Eosinophils Relative: 5 % (ref 0–5)
HCT: 44.4 % (ref 36.0–46.0)
Hemoglobin: 14.4 g/dL (ref 12.0–15.0)
Lymphocytes Relative: 32 % (ref 12–46)
Lymphs Abs: 2.6 10*3/uL (ref 0.7–4.0)
MCH: 26.6 pg (ref 26.0–34.0)
MCHC: 32.4 g/dL (ref 30.0–36.0)
MCV: 81.9 fL (ref 78.0–100.0)
MPV: 10 fL (ref 8.6–12.4)
Monocytes Absolute: 0.7 10*3/uL (ref 0.1–1.0)
Monocytes Relative: 9 % (ref 3–12)
Neutro Abs: 4.2 10*3/uL (ref 1.7–7.7)
Neutrophils Relative %: 53 % (ref 43–77)
Platelets: 330 10*3/uL (ref 150–400)
RBC: 5.42 MIL/uL — ABNORMAL HIGH (ref 3.87–5.11)
RDW: 15.3 % (ref 11.5–15.5)
WBC: 8 10*3/uL (ref 4.0–10.5)

## 2015-07-13 LAB — BASIC METABOLIC PANEL WITH GFR
BUN: 18 mg/dL (ref 7–25)
CO2: 24 mmol/L (ref 20–31)
Calcium: 9.7 mg/dL (ref 8.6–10.4)
Chloride: 101 mmol/L (ref 98–110)
Creat: 0.85 mg/dL (ref 0.60–0.93)
GFR, Est African American: 78 mL/min (ref >=60)
GFR, Est Non African American: 67 mL/min (ref >=60)
Glucose, Bld: 81 mg/dL (ref 65–99)
Potassium: 4.1 mmol/L (ref 3.5–5.3)
Sodium: 137 mmol/L (ref 135–146)

## 2015-07-13 LAB — HEPATIC FUNCTION PANEL
ALT: 13 U/L (ref 6–29)
AST: 19 U/L (ref 10–35)
Albumin: 4.1 g/dL (ref 3.6–5.1)
Alkaline Phosphatase: 78 U/L (ref 33–130)
Bilirubin, Direct: 0.2 mg/dL (ref <=0.2)
Indirect Bilirubin: 0.5 mg/dL (ref 0.2–1.2)
Total Bilirubin: 0.7 mg/dL (ref 0.2–1.2)
Total Protein: 7.1 g/dL (ref 6.1–8.1)

## 2015-07-13 LAB — LIPID PANEL
Cholesterol: 167 mg/dL (ref 125–200)
HDL: 77 mg/dL (ref >=46)
LDL Cholesterol: 71 mg/dL (ref <130)
Total CHOL/HDL Ratio: 2.2 Ratio (ref <=5.0)
Triglycerides: 94 mg/dL (ref <150)
VLDL: 19 mg/dL (ref <30)

## 2015-07-13 LAB — TSH: TSH: 1.365 u[IU]/mL (ref 0.350–4.500)

## 2015-07-13 MED ORDER — CLOBETASOL PROPIONATE 0.05 % EX OINT: TOPICAL_OINTMENT | Freq: Two times a day (BID) | CUTANEOUS | Status: DC

## 2015-07-13 NOTE — Patient Instructions (Signed)

## 2015-07-13 NOTE — Progress Notes (Signed)
Patient ID: Kimberly Watson, female   DOB: [birth year], 74 y.o.   MRN: [id number]     This very nice 74 y.o. DWF presents for  follow up with Hypertension, Hyperlipidemia, Pre-Diabetes and Vitamin D Deficiency. Patient has remote hx/o GERD which has been asx.      Patient is treated for HTN & BP has been controlled at home. Today's BP: 124/90 mmHg BP was rechecked w/ the wrist cuff and the wall cuff and was 170/105.  Patient has had no complaints of any cardiac type chest pain, palpitations, dyspnea/orthopnea/PND, dizziness, claudication, or dependent edema.     Hyperlipidemia is controlled with diet & meds. Patient denies myalgias or other med SE's. Last Lipids were  Cholesterol 167; HDL 67; LDL 78; Triglycerides 108 on 03/30/2015.      Also, the patient has history of PreDiabetes and has had no symptoms of reactive hypoglycemia, diabetic polys, paresthesias or visual blurring.  Last A1c was  5.5% on 03/30/2015.      Further, the patient also has history of Vitamin D Deficiency and supplements vitamin D without any suspected side-effects. Last vitamin D was 43 on 03/30/2015.  Medication Sig  . aspirin 81 MG tablet Take 81 mg by mouth every other day.  . OS-CAL 600 MG TABS tablet Take 600 mg by mouth daily.  Marland Kitchen VITAMIN D 1000 UNITS tablet Take 1,000 Units by mouth 1 day or 1 dose. Take 3 tablets daily.  . metoprolol succinate -XL 100 MG TAKE 1 TABLET (100 MG TOTAL) BY MOUTH DAILY  . nystatin (MYCOSTATIN/NYSTOP) 100000 UNIT/GM POWD Apply to affected area after a shower daily or as needed  . raloxifene (EVISTA) 60 MG tablet TAKE 1 TABLET BY MOUTH EVERY DAY  . clobetasol ointment (TEMOVATE) 0.05 % Apply topically 2 (two) times daily.  . furosemide  40 MG tablet Take 40 mg by mouth - misses doses and takes otc potassium w/ Lasix   Allergies  Allergen Reactions  . Amoxicillin     No allergy. States she takes before dentist with no problem  . Fosamax [Alendronate Sodium]     esophagitis   PMHx:    Past Medical History  Diagnosis Date  . Hypertension   . Osteoporosis   . GERD (gastroesophageal reflux disease)   . Nephrolithiasis   . Cataract   . Elevated hemoglobin A1c   . Leiomyoma     adnexal Leiomyoma removal in 1/11   Immunization History  Administered Date(s) Administered  . DT 03/30/2015  . Pneumococcal Conjugate-13 03/25/2014  . Zoster 12/27/2005   Past Surgical History  Procedure Laterality Date  . Joint replacement Left total knee   FHx:    Reviewed / unchanged  SHx:    Reviewed / unchanged  Systems Review:  Constitutional: Denies fever, chills, wt changes, headaches, insomnia, fatigue, night sweats, change in appetite. Eyes: Denies redness, blurred vision, diplopia, discharge, itchy, watery eyes.  ENT: Denies discharge, congestion, post nasal drip, epistaxis, sore throat, earache, hearing loss, dental pain, tinnitus, vertigo, sinus pain, snoring.  CV: Denies chest pain, palpitations, irregular heartbeat, syncope, dyspnea, diaphoresis, orthopnea, PND, claudication or edema. Respiratory: denies cough, dyspnea, DOE, pleurisy, hoarseness, laryngitis, wheezing.  Gastrointestinal: Denies dysphagia, odynophagia, heartburn, reflux, water brash, abdominal pain or cramps, nausea, vomiting, bloating, diarrhea, constipation, hematemesis, melena, hematochezia  or hemorrhoids. Genitourinary: Denies dysuria, frequency, urgency, nocturia, hesitancy, discharge, hematuria or flank pain. Musculoskeletal: Denies arthralgias, myalgias, stiffness, jt. swelling, pain, limping or strain/sprain.  Skin: Denies pruritus, rash, hives, warts, acne,  eczema or change in skin lesion(s). Neuro: No weakness, tremor, incoordination, spasms, paresthesia or pain. Psychiatric: Denies confusion, memory loss or sensory loss. Endo: Denies change in weight, skin or hair change.  Heme/Lymph: No excessive bleeding, bruising or enlarged lymph nodes.  Physical Exam  BP 124/90 mmHg  Pulse 72   Temp(Src) 97.3 F (36.3 C)  Resp 16  Ht 5\' 4"  (1.626 m)  Wt 180 lb 3.2 oz (81.738 kg)  BMI 30.92 kg/m2  Appears well nourished and in no distress. Eyes: PERRLA, EOMs, conjunctiva no swelling or erythema. Sinuses: No frontal/maxillary tenderness ENT/Mouth: EAC's clear, TM's nl w/o erythema, bulging. Nares clear w/o erythema, swelling, exudates. Oropharynx clear without erythema or exudates. Oral hygiene is good. Tongue normal, non obstructing. Hearing intact.  Neck: Supple. Thyroid nl. Car 2+/2+ without bruits, nodes or JVD. Chest: Respirations nl with BS clear & equal w/o rales, rhonchi, wheezing or stridor.  Cor: Heart sounds normal w/ regular rate and rhythm without sig. murmurs, gallops, clicks, or rubs. Peripheral pulses normal and equal  without edema.  Abdomen: Soft & bowel sounds normal. Non-tender w/o guarding, rebound, hernias, masses, or organomegaly.  Lymphatics: Unremarkable.  Musculoskeletal: Full ROM all peripheral extremities, joint stability, 5/5 strength, and normal gait.  Skin: Warm, dry without exposed rashes, lesions or ecchymosis apparent.  Neuro: Cranial nerves intact, reflexes equal bilaterally. Sensory-motor testing grossly intact. Tendon reflexes grossly intact.  Pysch: Alert & oriented x 3.  Insight and judgement nl & appropriate. No ideations.  Assessment and Plan:  1. Essential hypertension  - Recc change Toprol 100 XL  to qam and to take her lasix daily and monitor BP's & scall >150/90.  - TSH  2. Mixed hyperlipidemia  - Lipid panel - TSH  3. Prediabetes  - Hemoglobin A1c - Insulin, random  4. Vitamin D deficiency  - VITAMIN D 25 Hydroxy   5. Gastroesophageal reflux disease   6. Morbid obesity,  ([place])   7. Other abnormal glucose  - Hemoglobin A1c - Insulin, random  8. Medication management  - CBC with Differential/Platelet - BASIC METABOLIC PANEL WITH GFR - Hepatic function panel - Magnesium   Recommended regular exercise, BP  monitoring, weight control, and discussed med and SE's. Recommended labs to assess and monitor clinical status. Further disposition pending results of labs. Over 30 minutes of exam, counseling, chart review was performed

## 2015-07-14 LAB — HEMOGLOBIN A1C
Hgb A1c MFr Bld: 5.8 % — ABNORMAL HIGH (ref <5.7)
Mean Plasma Glucose: 120 mg/dL — ABNORMAL HIGH (ref <117)

## 2015-07-14 LAB — VITAMIN D 25 HYDROXY (VIT D DEFICIENCY, FRACTURES): Vit D, 25-Hydroxy: 46 ng/mL (ref 30–100)

## 2015-07-14 LAB — INSULIN, RANDOM: Insulin: 4.3 u[IU]/mL (ref 2.0–19.6)

## 2015-08-01 DIAGNOSIS — I2699 Other pulmonary embolism without acute cor pulmonale: Secondary | ICD-10-CM

## 2015-08-01 HISTORY — PX: IVC FILTER PLACEMENT (ARMC HX): HXRAD1551

## 2015-08-01 HISTORY — DX: Other pulmonary embolism without acute cor pulmonale: I26.99

## 2015-08-18 ENCOUNTER — Inpatient Hospital Stay (HOSPITAL_COMMUNITY)
Admission: EM | Admit: 2015-08-18 | Discharge: 2015-08-24 | DRG: 166 | Disposition: A | Discharge status: Home Health | Payer: Medicare Other | Attending: Internal Medicine | Admitting: Internal Medicine

## 2015-08-18 ENCOUNTER — Encounter (HOSPITAL_COMMUNITY): Payer: Self-pay | Admitting: Emergency Medicine

## 2015-08-18 DIAGNOSIS — E782 Mixed hyperlipidemia: Secondary | ICD-10-CM | POA: Diagnosis present

## 2015-08-18 DIAGNOSIS — Z7982 Long term (current) use of aspirin: Secondary | ICD-10-CM

## 2015-08-18 DIAGNOSIS — R42 Dizziness and giddiness: Secondary | ICD-10-CM | POA: Diagnosis not present

## 2015-08-18 DIAGNOSIS — E669 Obesity, unspecified: Secondary | ICD-10-CM | POA: Diagnosis present

## 2015-08-18 DIAGNOSIS — K219 Gastro-esophageal reflux disease without esophagitis: Secondary | ICD-10-CM | POA: Diagnosis present

## 2015-08-18 DIAGNOSIS — R600 Localized edema: Secondary | ICD-10-CM

## 2015-08-18 DIAGNOSIS — I509 Heart failure, unspecified: Secondary | ICD-10-CM | POA: Diagnosis not present

## 2015-08-18 DIAGNOSIS — N189 Chronic kidney disease, unspecified: Secondary | ICD-10-CM | POA: Diagnosis present

## 2015-08-18 DIAGNOSIS — E66811 Obesity, class 1: Secondary | ICD-10-CM | POA: Diagnosis present

## 2015-08-18 DIAGNOSIS — I82412 Acute embolism and thrombosis of left femoral vein: Secondary | ICD-10-CM | POA: Diagnosis present

## 2015-08-18 DIAGNOSIS — Z86718 Personal history of other venous thrombosis and embolism: Secondary | ICD-10-CM | POA: Insufficient documentation

## 2015-08-18 DIAGNOSIS — I10 Essential (primary) hypertension: Secondary | ICD-10-CM | POA: Diagnosis present

## 2015-08-18 DIAGNOSIS — I82492 Acute embolism and thrombosis of other specified deep vein of left lower extremity: Secondary | ICD-10-CM | POA: Diagnosis present

## 2015-08-18 DIAGNOSIS — I82432 Acute embolism and thrombosis of left popliteal vein: Secondary | ICD-10-CM | POA: Diagnosis not present

## 2015-08-18 DIAGNOSIS — K589 Irritable bowel syndrome without diarrhea: Secondary | ICD-10-CM | POA: Diagnosis present

## 2015-08-18 DIAGNOSIS — Z79899 Other long term (current) drug therapy: Secondary | ICD-10-CM

## 2015-08-18 DIAGNOSIS — I82442 Acute embolism and thrombosis of left tibial vein: Secondary | ICD-10-CM | POA: Diagnosis present

## 2015-08-18 DIAGNOSIS — Z6833 Body mass index (BMI) 33.0-33.9, adult: Secondary | ICD-10-CM

## 2015-08-18 DIAGNOSIS — R778 Other specified abnormalities of plasma proteins: Secondary | ICD-10-CM | POA: Diagnosis present

## 2015-08-18 DIAGNOSIS — J9601 Acute respiratory failure with hypoxia: Secondary | ICD-10-CM | POA: Diagnosis not present

## 2015-08-18 DIAGNOSIS — N179 Acute kidney failure, unspecified: Secondary | ICD-10-CM | POA: Diagnosis not present

## 2015-08-18 DIAGNOSIS — R740 Nonspecific elevation of levels of transaminase and lactic acid dehydrogenase [LDH]: Secondary | ICD-10-CM | POA: Diagnosis present

## 2015-08-18 DIAGNOSIS — I82409 Acute embolism and thrombosis of unspecified deep veins of unspecified lower extremity: Secondary | ICD-10-CM

## 2015-08-18 DIAGNOSIS — I2699 Other pulmonary embolism without acute cor pulmonale: Secondary | ICD-10-CM

## 2015-08-18 DIAGNOSIS — M810 Age-related osteoporosis without current pathological fracture: Secondary | ICD-10-CM | POA: Diagnosis present

## 2015-08-18 DIAGNOSIS — T68XXXA Hypothermia, initial encounter: Secondary | ICD-10-CM | POA: Diagnosis not present

## 2015-08-18 DIAGNOSIS — R1013 Epigastric pain: Secondary | ICD-10-CM | POA: Diagnosis not present

## 2015-08-18 DIAGNOSIS — Z8249 Family history of ischemic heart disease and other diseases of the circulatory system: Secondary | ICD-10-CM

## 2015-08-18 DIAGNOSIS — I2602 Saddle embolus of pulmonary artery with acute cor pulmonale: Secondary | ICD-10-CM | POA: Insufficient documentation

## 2015-08-18 DIAGNOSIS — R0602 Shortness of breath: Secondary | ICD-10-CM | POA: Diagnosis not present

## 2015-08-18 DIAGNOSIS — I110 Hypertensive heart disease with heart failure: Secondary | ICD-10-CM | POA: Diagnosis not present

## 2015-08-18 DIAGNOSIS — Z96652 Presence of left artificial knee joint: Secondary | ICD-10-CM | POA: Diagnosis present

## 2015-08-18 DIAGNOSIS — R7989 Other specified abnormal findings of blood chemistry: Secondary | ICD-10-CM

## 2015-08-18 DIAGNOSIS — Z888 Allergy status to other drugs, medicaments and biological substances status: Secondary | ICD-10-CM

## 2015-08-18 DIAGNOSIS — I1310 Hypertensive heart and chronic kidney disease without heart failure, with stage 1 through stage 4 chronic kidney disease, or unspecified chronic kidney disease: Secondary | ICD-10-CM | POA: Diagnosis present

## 2015-08-18 HISTORY — DX: Other specified postprocedural states: Z98.890

## 2015-08-18 LAB — CBG MONITORING, ED: Glucose-Capillary: 187 mg/dL — ABNORMAL HIGH (ref 65–99)

## 2015-08-18 NOTE — ED Notes (Signed)
EKG given to EDP,Pickering, MD., for review. 

## 2015-08-18 NOTE — ED Provider Notes (Signed)
CSN: [id number]     Arrival date & time 08/18/15  2307 History  By signing my name below, I, Kimberly Watson, attest that this documentation has been prepared under the direction and in the presence of [name], MD. Electronically Signed: Soijett Watson, ED Scribe. 08/18/2015. 11:53 PM.   Chief Complaint  Patient presents with  . Abdominal Pain  . Dizziness      The history is provided by the patient. No language interpreter was used.    HPI Comments: Kimberly Watson is a 75 y.o. female with a medical hx of HTN, GERD, IBS, hiatal hernia, who presents to the Emergency Department complaining of SOB onset several years worsening yesterday. She notes that her SOB is worsened with ambulation only. She states that her SOB is also worsened when the Ozone levels are high. She notes that she has been experiencing vertigo x 2 days as well. Pt is having associated symptoms of mild posterior and sinus HA x 2 days, anxiety, and diarrhea. She reports that she wakes up with her HA and she has a hx of them. Pt has had a left knee replacement and she notes that she will have chronic intermittent left leg swelling. Pt states that she has been off of her lasix x several days. She notes that she has not tried any medications for the relief of her symptoms. She denies CP, fever, vomiting, leg swelling/pain, and any other symptoms. Pt denies PMHx of blood clots or CHF at this time. She reports that she has an appointment with her PCP in the morning.   She secondarily complains of abdominal discomfort (epigastric) x several years due to a hiatal hernia. Pt has not tried any medications for the relief of her symptoms. She denies any other associated symptoms.   Past Medical History  Diagnosis Date  . Hypertension   . Osteoporosis   . GERD (gastroesophageal reflux disease)   . Nephrolithiasis   . Cataract   . Elevated hemoglobin A1c   . Leiomyoma     adnexal Leiomyoma removal in 1/11   Past Surgical History   Procedure Laterality Date  . Joint replacement Left total knee  . Knee surgery    . Uterine fibroid surgery    . Cholecystectomy     Family History  Problem Relation Age of Onset  . Hypertension Mother   . Diabetes Mother   . Breast cancer Mother   . Congestive Heart Failure Mother   . Osteoporosis Father    Social History  Substance Use Topics  . Smoking status: Never Smoker   . Smokeless tobacco: Never Used  . Alcohol Use: No   OB History    No data available     Review of Systems  Constitutional: Negative for fever.  Respiratory: Positive for shortness of breath.   Cardiovascular: Negative for chest pain and leg swelling.  Gastrointestinal: Positive for abdominal pain and diarrhea. Negative for vomiting.  Neurological: Positive for dizziness and headaches.  All other systems reviewed and are negative.     Allergies  Fosamax  Home Medications   Prior to Admission medications   Medication Sig Start Date End Date Taking? Authorizing Provider  aspirin 81 MG tablet Take 81 mg by mouth every other day.   Yes Historical Provider, MD  calcium carbonate (OS-CAL) 600 MG TABS tablet Take 600 mg by mouth daily.   Yes Historical Provider, MD  cholecalciferol (VITAMIN D) 1000 UNITS tablet Take 1,000 Units by mouth 1 day or  1 dose. Take 3 tablets daily.   Yes Historical Provider, MD  clobetasol ointment (TEMOVATE) 0.05 % Apply topically 2 (two) times daily. 07/13/15  Yes Unk Pinto, MD  furosemide (LASIX) 40 MG tablet Take 40 mg by mouth daily.    Yes Historical Provider, MD  MAGNESIUM PO Take 1 tablet by mouth daily.   Yes Historical Provider, MD  metoprolol succinate (TOPROL-XL) 100 MG 24 hr tablet TAKE 1 TABLET (100 MG TOTAL) BY MOUTH DAILY. TAKE WITH OR IMMEDIATELY FOLLOWING A MEAL. 07/04/15  Yes Unk Pinto, MD  nystatin (MYCOSTATIN/NYSTOP) 100000 UNIT/GM POWD Apply to affected area after a shower daily or as needed 03/25/14  Yes Vicie Mutters, PA-C  OVER THE  COUNTER MEDICATION Place 1 spray into both nostrils daily.   Yes Historical Provider, MD  oxymetazoline (AFRIN) 0.05 % nasal spray Place 1 spray into both nostrils 2 (two) times daily.   Yes Historical Provider, MD  raloxifene (EVISTA) 60 MG tablet TAKE 1 TABLET BY MOUTH EVERY DAY 05/28/15  Yes Courtney Forcucci, PA-C   BP 113/81 mmHg  Pulse 95  Temp(Src) 96.3 F (35.7 C) (Axillary)  Resp 20  Ht 5\' 2"  (1.575 m)  Wt 176 lb (79.833 kg)  BMI 32.18 kg/m2  SpO2 93% Physical Exam  Constitutional: She is oriented to person, place, and time. She appears well-developed and well-nourished.  HENT:  Head: Normocephalic and atraumatic.  Cardiovascular: Normal rate and regular rhythm.   No murmur heard. Pulmonary/Chest: Effort normal and breath sounds normal. No respiratory distress.  Abdominal: Soft. There is no tenderness. There is no rebound and no guarding.  Musculoskeletal: She exhibits edema. She exhibits no tenderness.  1+ pitting edema in left lower extremity with trace pitting edema in the right lower extremity.   Neurological: She is alert and oriented to person, place, and time.  Skin: Skin is warm and dry.  Psychiatric: She has a normal mood and affect. Her behavior is normal.  Nursing note and vitals reviewed.   ED Course  Procedures (including critical care time) DIAGNOSTIC STUDIES: Oxygen Saturation is 95% on RA, adequate by my interpretation.    COORDINATION OF CARE: 11:49 PM Discussed treatment plan with pt at bedside which includes labs, UA, and pt agreed to plan.  2:15 AM- Pt updated with results and informed that she will need to be admitted. Pt has no CP or abdominal pain at this time.   Labs Review Labs Reviewed  CBC - Abnormal; Notable for the following:    WBC 12.8 (*)    RBC 5.47 (*)    HCT 46.8 (*)    All other components within normal limits  URINALYSIS, ROUTINE W REFLEX MICROSCOPIC (NOT AT [facility]) - Abnormal; Notable for the following:    Color, Urine AMBER  (*)    APPearance TURBID (*)    Glucose, UA 100 (*)    Hgb urine dipstick MODERATE (*)    Bilirubin Urine SMALL (*)    Protein, ur >300 (*)    Leukocytes, UA MODERATE (*)    All other components within normal limits  COMPREHENSIVE METABOLIC PANEL - Abnormal; Notable for the following:    Chloride 113 (*)    CO2 16 (*)    Glucose, Bld 192 (*)    BUN 31 (*)    Creatinine, Ser 1.52 (*)    AST 200 (*)    ALT 216 (*)    GFR calc non Af Amer 32 (*)    GFR calc Af Amer 38 (*)  All other components within normal limits  TROPONIN I - Abnormal; Notable for the following:    Troponin I 0.20 (*)    All other components within normal limits  BRAIN NATRIURETIC PEPTIDE - Abnormal; Notable for the following:    B Natriuretic Peptide 1021.6 (*)    All other components within normal limits  D-DIMER, QUANTITATIVE (NOT AT [facility]) - Abnormal; Notable for the following:    D-Dimer, Quant >20.00 (*)    All other components within normal limits  URINE MICROSCOPIC-ADD ON - Abnormal; Notable for the following:    Squamous Epithelial / LPF 6-30 (*)    Bacteria, UA MANY (*)    Casts HYALINE CASTS (*)    All other components within normal limits  PROTIME-INR - Abnormal; Notable for the following:    Prothrombin Time 15.9 (*)    All other components within normal limits  CBC - Abnormal; Notable for the following:    WBC 13.1 (*)    RBC 5.59 (*)    HCT 47.8 (*)    All other components within normal limits  TROPONIN I - Abnormal; Notable for the following:    Troponin I 0.25 (*)    All other components within normal limits  LACTIC ACID, PLASMA - Abnormal; Notable for the following:    Lactic Acid, Venous 4.0 (*)    All other components within normal limits  BASIC METABOLIC PANEL - Abnormal; Notable for the following:    CO2 16 (*)    Glucose, Bld 137 (*)    BUN 30 (*)    Creatinine, Ser 1.37 (*)    GFR calc non Af Amer 37 (*)    GFR calc Af Amer 43 (*)    Anion gap 18 (*)    All other  components within normal limits  CBG MONITORING, ED - Abnormal; Notable for the following:    Glucose-Capillary 187 (*)    All other components within normal limits  CULTURE, BLOOD (ROUTINE X 2)  CULTURE, BLOOD (ROUTINE X 2)  URINE CULTURE  LIPASE, BLOOD  APTT  PROCALCITONIN  HEPARIN LEVEL (UNFRACTIONATED)  HEMOGLOBIN A1C  LIPID PANEL  TROPONIN I  TROPONIN I  TSH  CREATININE, URINE, RANDOM  LACTIC ACID, PLASMA  UREA NITROGEN, URINE    Imaging Review Dg Chest 2 View  08/19/2015  CLINICAL DATA:  Acute onset of worsening shortness of breath on exertion. Sinus headache. Initial encounter. EXAM: CHEST  2 VIEW COMPARISON:  None. FINDINGS: The lungs are well-aerated and clear. There is no evidence of focal opacification, pleural effusion or pneumothorax. The heart is borderline enlarged. No acute osseous abnormalities are seen. Clips are noted within the right upper quadrant, reflecting prior cholecystectomy. IMPRESSION: Borderline cardiomegaly.  Lungs remain grossly clear. Electronically Signed   By: Garald Balding M.D.   On: 08/19/2015 01:03   Ct Head Wo Contrast  08/19/2015  CLINICAL DATA:  75 year old female with dizziness and vertigo EXAM: CT HEAD WITHOUT CONTRAST TECHNIQUE: Contiguous axial images were obtained from the base of the skull through the vertex without intravenous contrast. COMPARISON:  None. FINDINGS: The ventricles and sulci are appropriate in size for patient's age. Mild periventricular and deep white matter hypodensities represent chronic microvascular ischemic changes. There is no intracranial hemorrhage. No mass effect or midline shift identified. A 6 x 8 mm calcific density along the falx may represent a small calcified meningioma. The visualized paranasal sinuses and mastoid air cells are well aerated. The calvarium is intact. IMPRESSION: No acute  intracranial hemorrhage. Mild age-related atrophy and chronic microvascular ischemic disease. If symptoms persist and there  are no contraindications, MRI may provide better evaluation if clinically indicated Electronically Signed   By: Anner Crete M.D.   On: 08/19/2015 04:43   I have personally reviewed and evaluated these images and lab results as part of my medical decision-making.   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 She is in no acute distress in the emergency department. EKG with right bundle branch block, no priors available for comparison. She has no evidence of pulmonary edema on physical examination but does have considerable lower extremity edema that is asymmetric in nature. BNP is elevated as well as her d-dimer. Troponin is elevated and this is of unclear significance. MP with acute kidney injury compared to priors. Patient does have a new oxygen requirement. Started on heparin drip for possible PE given CT PE study cannot be obtained due to her kidney function. Plan to admit to medicine for further treatment. Discussed with patient findings of studies and recommendation for admission for further evaluation and treatment.  I personally performed the services described in this documentation, which was scribed in my presence. The recorded information has been reviewed and is accurate.   [name], MD 08/19/15 [phone]

## 2015-08-18 NOTE — ED Notes (Signed)
Bed: WA24 Expected date:  Expected time:  Means of arrival:  Comments: abd pain 

## 2015-08-18 NOTE — ED Notes (Signed)
Per EMS patient complains of dizzy, vertigo, and abdominal pain all day. Hx of hiatal hernia and IBS. Patient appears anxious.

## 2015-08-18 NOTE — ED Notes (Signed)
Notified RN,Jada pt. CBG 187.

## 2015-08-18 NOTE — ED Notes (Signed)
Attempted IV x2. Charge RN, Eliberto Ivory, attempting IV stick/blood draw.

## 2015-08-19 ENCOUNTER — Ambulatory Visit: Payer: Self-pay | Admitting: Internal Medicine

## 2015-08-19 ENCOUNTER — Inpatient Hospital Stay (HOSPITAL_COMMUNITY): Payer: Medicare Other

## 2015-08-19 ENCOUNTER — Encounter (HOSPITAL_COMMUNITY): Payer: Self-pay | Admitting: Cardiology

## 2015-08-19 ENCOUNTER — Ambulatory Visit (HOSPITAL_COMMUNITY): Payer: Medicare Other

## 2015-08-19 ENCOUNTER — Emergency Department (HOSPITAL_COMMUNITY): Payer: Medicare Other

## 2015-08-19 DIAGNOSIS — R7989 Other specified abnormal findings of blood chemistry: Secondary | ICD-10-CM | POA: Diagnosis not present

## 2015-08-19 DIAGNOSIS — N179 Acute kidney failure, unspecified: Secondary | ICD-10-CM

## 2015-08-19 DIAGNOSIS — I82409 Acute embolism and thrombosis of unspecified deep veins of unspecified lower extremity: Secondary | ICD-10-CM | POA: Insufficient documentation

## 2015-08-19 DIAGNOSIS — I2699 Other pulmonary embolism without acute cor pulmonale: Secondary | ICD-10-CM | POA: Diagnosis not present

## 2015-08-19 DIAGNOSIS — Z86718 Personal history of other venous thrombosis and embolism: Secondary | ICD-10-CM | POA: Insufficient documentation

## 2015-08-19 DIAGNOSIS — Z888 Allergy status to other drugs, medicaments and biological substances status: Secondary | ICD-10-CM | POA: Diagnosis not present

## 2015-08-19 DIAGNOSIS — R0789 Other chest pain: Secondary | ICD-10-CM | POA: Diagnosis not present

## 2015-08-19 DIAGNOSIS — R0600 Dyspnea, unspecified: Secondary | ICD-10-CM | POA: Diagnosis not present

## 2015-08-19 DIAGNOSIS — I82412 Acute embolism and thrombosis of left femoral vein: Secondary | ICD-10-CM | POA: Diagnosis not present

## 2015-08-19 DIAGNOSIS — R600 Localized edema: Secondary | ICD-10-CM

## 2015-08-19 DIAGNOSIS — I82442 Acute embolism and thrombosis of left tibial vein: Secondary | ICD-10-CM | POA: Diagnosis present

## 2015-08-19 DIAGNOSIS — Z79899 Other long term (current) drug therapy: Secondary | ICD-10-CM | POA: Diagnosis not present

## 2015-08-19 DIAGNOSIS — R778 Other specified abnormalities of plasma proteins: Secondary | ICD-10-CM | POA: Diagnosis present

## 2015-08-19 DIAGNOSIS — I82432 Acute embolism and thrombosis of left popliteal vein: Secondary | ICD-10-CM | POA: Diagnosis present

## 2015-08-19 DIAGNOSIS — R0602 Shortness of breath: Secondary | ICD-10-CM | POA: Diagnosis not present

## 2015-08-19 DIAGNOSIS — M810 Age-related osteoporosis without current pathological fracture: Secondary | ICD-10-CM | POA: Diagnosis present

## 2015-08-19 DIAGNOSIS — K219 Gastro-esophageal reflux disease without esophagitis: Secondary | ICD-10-CM

## 2015-08-19 DIAGNOSIS — Z7982 Long term (current) use of aspirin: Secondary | ICD-10-CM | POA: Diagnosis not present

## 2015-08-19 DIAGNOSIS — N390 Urinary tract infection, site not specified: Secondary | ICD-10-CM

## 2015-08-19 DIAGNOSIS — I10 Essential (primary) hypertension: Secondary | ICD-10-CM | POA: Diagnosis not present

## 2015-08-19 DIAGNOSIS — I1310 Hypertensive heart and chronic kidney disease without heart failure, with stage 1 through stage 4 chronic kidney disease, or unspecified chronic kidney disease: Secondary | ICD-10-CM | POA: Diagnosis present

## 2015-08-19 DIAGNOSIS — N189 Chronic kidney disease, unspecified: Secondary | ICD-10-CM | POA: Diagnosis present

## 2015-08-19 DIAGNOSIS — I743 Embolism and thrombosis of arteries of the lower extremities: Secondary | ICD-10-CM | POA: Diagnosis not present

## 2015-08-19 DIAGNOSIS — E782 Mixed hyperlipidemia: Secondary | ICD-10-CM | POA: Diagnosis present

## 2015-08-19 DIAGNOSIS — Z6833 Body mass index (BMI) 33.0-33.9, adult: Secondary | ICD-10-CM | POA: Diagnosis not present

## 2015-08-19 DIAGNOSIS — R42 Dizziness and giddiness: Secondary | ICD-10-CM

## 2015-08-19 DIAGNOSIS — K589 Irritable bowel syndrome without diarrhea: Secondary | ICD-10-CM | POA: Diagnosis present

## 2015-08-19 DIAGNOSIS — R531 Weakness: Secondary | ICD-10-CM

## 2015-08-19 DIAGNOSIS — R740 Nonspecific elevation of levels of transaminase and lactic acid dehydrogenase [LDH]: Secondary | ICD-10-CM | POA: Diagnosis present

## 2015-08-19 DIAGNOSIS — J9601 Acute respiratory failure with hypoxia: Secondary | ICD-10-CM

## 2015-08-19 DIAGNOSIS — T68XXXA Hypothermia, initial encounter: Secondary | ICD-10-CM | POA: Diagnosis present

## 2015-08-19 DIAGNOSIS — I82492 Acute embolism and thrombosis of other specified deep vein of left lower extremity: Secondary | ICD-10-CM | POA: Diagnosis present

## 2015-08-19 DIAGNOSIS — I214 Non-ST elevation (NSTEMI) myocardial infarction: Secondary | ICD-10-CM

## 2015-08-19 DIAGNOSIS — Z8249 Family history of ischemic heart disease and other diseases of the circulatory system: Secondary | ICD-10-CM | POA: Diagnosis not present

## 2015-08-19 DIAGNOSIS — I2602 Saddle embolus of pulmonary artery with acute cor pulmonale: Secondary | ICD-10-CM | POA: Diagnosis not present

## 2015-08-19 DIAGNOSIS — Z96652 Presence of left artificial knee joint: Secondary | ICD-10-CM | POA: Diagnosis present

## 2015-08-19 HISTORY — DX: Acute kidney failure, unspecified: N17.9

## 2015-08-19 LAB — LACTIC ACID, PLASMA
Lactic Acid, Venous: 4 mmol/L (ref 0.5–2.0)
Lactic Acid, Venous: 2.5 mmol/L (ref 0.5–2.0)

## 2015-08-19 LAB — BASIC METABOLIC PANEL
Anion gap: 18 — ABNORMAL HIGH (ref 5–15)
BUN: 30 mg/dL — ABNORMAL HIGH (ref 6–20)
CO2: 16 mmol/L — ABNORMAL LOW (ref 22–32)
Calcium: 9.2 mg/dL (ref 8.9–10.3)
Chloride: 109 mmol/L (ref 101–111)
Creatinine, Ser: 1.37 mg/dL — ABNORMAL HIGH (ref 0.44–1.00)
GFR calc Af Amer: 43 mL/min — ABNORMAL LOW (ref >60)
GFR calc non Af Amer: 37 mL/min — ABNORMAL LOW (ref >60)
Glucose, Bld: 137 mg/dL — ABNORMAL HIGH (ref 65–99)
Potassium: 4.9 mmol/L (ref 3.5–5.1)
Sodium: 143 mmol/L (ref 135–145)

## 2015-08-19 LAB — LIPID PANEL
Cholesterol: 146 mg/dL (ref 0–200)
HDL: 54 mg/dL (ref >40)
LDL Cholesterol: 72 mg/dL (ref 0–99)
Total CHOL/HDL Ratio: 2.7 RATIO
Triglycerides: 98 mg/dL (ref <150)
VLDL: 20 mg/dL (ref 0–40)

## 2015-08-19 LAB — URINE MICROSCOPIC-ADD ON

## 2015-08-19 LAB — APTT: aPTT: 28 seconds (ref 24–37)

## 2015-08-19 LAB — CBC
HCT: 46.8 % — ABNORMAL HIGH (ref 36.0–46.0)
HCT: 47.8 % — ABNORMAL HIGH (ref 36.0–46.0)
Hemoglobin: 14.7 g/dL (ref 12.0–15.0)
Hemoglobin: 15 g/dL (ref 12.0–15.0)
MCH: 26.8 pg (ref 26.0–34.0)
MCH: 26.9 pg (ref 26.0–34.0)
MCHC: 31.4 g/dL (ref 30.0–36.0)
MCHC: 31.4 g/dL (ref 30.0–36.0)
MCV: 85.5 fL (ref 78.0–100.0)
MCV: 85.6 fL (ref 78.0–100.0)
Platelets: 204 10*3/uL (ref 150–400)
Platelets: 204 10*3/uL (ref 150–400)
RBC: 5.47 MIL/uL — ABNORMAL HIGH (ref 3.87–5.11)
RBC: 5.59 MIL/uL — ABNORMAL HIGH (ref 3.87–5.11)
RDW: 15.4 % (ref 11.5–15.5)
RDW: 15.5 % (ref 11.5–15.5)
WBC: 12.8 10*3/uL — ABNORMAL HIGH (ref 4.0–10.5)
WBC: 13.1 10*3/uL — ABNORMAL HIGH (ref 4.0–10.5)

## 2015-08-19 LAB — TROPONIN I
Troponin I: 0.2 ng/mL — ABNORMAL HIGH (ref <0.031)
Troponin I: 0.25 ng/mL — ABNORMAL HIGH (ref <0.031)
Troponin I: 0.23 ng/mL — ABNORMAL HIGH (ref <0.031)
Troponin I: 0.2 ng/mL — ABNORMAL HIGH (ref <0.031)

## 2015-08-19 LAB — COMPREHENSIVE METABOLIC PANEL
ALT: 216 U/L — ABNORMAL HIGH (ref 14–54)
AST: 200 U/L — ABNORMAL HIGH (ref 15–41)
Albumin: 4 g/dL (ref 3.5–5.0)
Alkaline Phosphatase: 102 U/L (ref 38–126)
Anion gap: 14 (ref 5–15)
BUN: 31 mg/dL — ABNORMAL HIGH (ref 6–20)
CO2: 16 mmol/L — ABNORMAL LOW (ref 22–32)
Calcium: 9.1 mg/dL (ref 8.9–10.3)
Chloride: 113 mmol/L — ABNORMAL HIGH (ref 101–111)
Creatinine, Ser: 1.52 mg/dL — ABNORMAL HIGH (ref 0.44–1.00)
GFR calc Af Amer: 38 mL/min — ABNORMAL LOW (ref >60)
GFR calc non Af Amer: 32 mL/min — ABNORMAL LOW (ref >60)
Glucose, Bld: 192 mg/dL — ABNORMAL HIGH (ref 65–99)
Potassium: 4.8 mmol/L (ref 3.5–5.1)
Sodium: 143 mmol/L (ref 135–145)
Total Bilirubin: 0.9 mg/dL (ref 0.3–1.2)
Total Protein: 7.4 g/dL (ref 6.5–8.1)

## 2015-08-19 LAB — GLUCOSE, CAPILLARY: Glucose-Capillary: 119 mg/dL — ABNORMAL HIGH (ref 65–99)

## 2015-08-19 LAB — URINALYSIS, ROUTINE W REFLEX MICROSCOPIC
Glucose, UA: 100 mg/dL — AB
Ketones, ur: NEGATIVE mg/dL
Nitrite: NEGATIVE
Protein, ur: >300 mg/dL — AB
Specific Gravity, Urine: 1.027 (ref 1.005–1.030)
pH: 5 (ref 5.0–8.0)

## 2015-08-19 LAB — PROTIME-INR
INR: 1.26 (ref 0.00–1.49)
Prothrombin Time: 15.9 seconds — ABNORMAL HIGH (ref 11.6–15.2)

## 2015-08-19 LAB — BRAIN NATRIURETIC PEPTIDE: B Natriuretic Peptide: 1021.6 pg/mL — ABNORMAL HIGH (ref 0.0–100.0)

## 2015-08-19 LAB — D-DIMER, QUANTITATIVE: D-Dimer, Quant: >20 ug/mL-FEU — ABNORMAL HIGH (ref 0.00–0.50)

## 2015-08-19 LAB — LIPASE, BLOOD: Lipase: 28 U/L (ref 11–51)

## 2015-08-19 LAB — PROCALCITONIN: Procalcitonin: <0.1 ng/mL

## 2015-08-19 LAB — CREATININE, URINE, RANDOM: Creatinine, Urine: 338.48 mg/dL

## 2015-08-19 LAB — TSH: TSH: 1.995 u[IU]/mL (ref 0.350–4.500)

## 2015-08-19 LAB — CBG MONITORING, ED: Glucose-Capillary: 84 mg/dL (ref 65–99)

## 2015-08-19 LAB — HEPARIN LEVEL (UNFRACTIONATED): Heparin Unfractionated: 0.47 IU/mL (ref 0.30–0.70)

## 2015-08-19 LAB — MRSA PCR SCREENING: MRSA by PCR: NEGATIVE

## 2015-08-19 MED ORDER — TECHNETIUM TO 99M ALBUMIN AGGREGATED
4.1000 | Freq: Once | INTRAVENOUS | Status: AC | PRN
  Administered 2015-08-19: 4 via INTRAVENOUS

## 2015-08-19 MED ORDER — HEPARIN BOLUS VIA INFUSION
3000.0000 [IU] | Freq: Once | INTRAVENOUS | Status: AC
  Administered 2015-08-19: 3000 [IU] via INTRAVENOUS
  Filled 2015-08-19: qty 3000

## 2015-08-19 MED ORDER — MORPHINE SULFATE (PF) 2 MG/ML IV SOLN: 2.0000 mg | INTRAVENOUS | Status: DC | PRN

## 2015-08-19 MED ORDER — SODIUM CHLORIDE 0.9 % IV SOLN
INTRAVENOUS | Status: DC
  Administered 2015-08-19 – 2015-08-22 (×6): via INTRAVENOUS

## 2015-08-19 MED ORDER — METOPROLOL SUCCINATE ER 100 MG PO TB24
100.0000 mg | ORAL_TABLET | Freq: Every day | ORAL | Status: DC
  Administered 2015-08-19 – 2015-08-24 (×6): 100 mg via ORAL
  Filled 2015-08-19 (×6): qty 1

## 2015-08-19 MED ORDER — ACETAMINOPHEN 650 MG RE SUPP: 650.0000 mg | Freq: Four times a day (QID) | RECTAL | Status: DC | PRN

## 2015-08-19 MED ORDER — DEXTROSE 5 % IV SOLN: 1.0000 g | INTRAVENOUS | Status: DC

## 2015-08-19 MED ORDER — CLOBETASOL PROPIONATE 0.05 % EX OINT
TOPICAL_OINTMENT | Freq: Two times a day (BID) | CUTANEOUS | Status: DC
  Administered 2015-08-21 – 2015-08-24 (×6): via TOPICAL
  Filled 2015-08-19 (×3): qty 15

## 2015-08-19 MED ORDER — ASPIRIN 81 MG PO CHEW
81.0000 mg | CHEWABLE_TABLET | ORAL | Status: DC
  Administered 2015-08-19 – 2015-08-24 (×4): 81 mg via ORAL
  Filled 2015-08-19 (×4): qty 1

## 2015-08-19 MED ORDER — IOHEXOL 350 MG/ML SOLN
100.0000 mL | Freq: Once | INTRAVENOUS | Status: AC | PRN
  Administered 2015-08-19: 100 mL via INTRAVENOUS

## 2015-08-19 MED ORDER — DEXTROSE 5 % IV SOLN
1.0000 g | Freq: Once | INTRAVENOUS | Status: AC
  Administered 2015-08-19: 1 g via INTRAVENOUS
  Filled 2015-08-19: qty 10

## 2015-08-19 MED ORDER — HEPARIN (PORCINE) IN NACL 100-0.45 UNIT/ML-% IJ SOLN
1200.0000 [IU]/h | INTRAMUSCULAR | Status: DC
  Administered 2015-08-19 – 2015-08-21 (×5): 1100 [IU]/h via INTRAVENOUS
  Administered 2015-08-22 – 2015-08-23 (×2): 1200 [IU]/h via INTRAVENOUS
  Filled 2015-08-19 (×10): qty 250

## 2015-08-19 MED ORDER — MECLIZINE HCL 12.5 MG PO TABS
12.5000 mg | ORAL_TABLET | Freq: Three times a day (TID) | ORAL | Status: DC | PRN
  Filled 2015-08-19: qty 1

## 2015-08-19 MED ORDER — CALCIUM CARBONATE 1250 (500 CA) MG PO TABS
1250.0000 mg | ORAL_TABLET | Freq: Every day | ORAL | Status: DC
  Administered 2015-08-19 – 2015-08-24 (×6): 1250 mg via ORAL
  Filled 2015-08-19 (×6): qty 1

## 2015-08-19 MED ORDER — VANCOMYCIN HCL IN DEXTROSE 1-5 GM/200ML-% IV SOLN
1000.0000 mg | INTRAVENOUS | Status: DC
  Administered 2015-08-19 – 2015-08-20 (×2): 1000 mg via INTRAVENOUS
  Filled 2015-08-19 (×3): qty 200

## 2015-08-19 MED ORDER — ALBUTEROL SULFATE (2.5 MG/3ML) 0.083% IN NEBU: 2.5000 mg | INHALATION_SOLUTION | Freq: Four times a day (QID) | RESPIRATORY_TRACT | Status: DC | PRN

## 2015-08-19 MED ORDER — TECHNETIUM TC 99M DIETHYLENETRIAME-PENTAACETIC ACID: 31.7000 | Freq: Once | INTRAVENOUS | Status: DC | PRN

## 2015-08-19 MED ORDER — ATORVASTATIN CALCIUM 40 MG PO TABS
40.0000 mg | ORAL_TABLET | Freq: Every day | ORAL | Status: DC
  Filled 2015-08-19: qty 1

## 2015-08-19 MED ORDER — DEXTROSE 5 % IV SOLN
2.0000 g | INTRAVENOUS | Status: DC
  Administered 2015-08-19 – 2015-08-21 (×3): 2 g via INTRAVENOUS
  Filled 2015-08-19 (×4): qty 2

## 2015-08-19 MED ORDER — FUROSEMIDE 10 MG/ML IJ SOLN
40.0000 mg | Freq: Once | INTRAMUSCULAR | Status: AC
  Administered 2015-08-19: 40 mg via INTRAVENOUS
  Filled 2015-08-19: qty 4

## 2015-08-19 MED ORDER — OXYMETAZOLINE HCL 0.05 % NA SOLN
1.0000 | Freq: Two times a day (BID) | NASAL | Status: DC
  Administered 2015-08-19 – 2015-08-24 (×11): 1 via NASAL
  Filled 2015-08-19 (×3): qty 15

## 2015-08-19 MED ORDER — SODIUM CHLORIDE 0.9 % IJ SOLN
3.0000 mL | Freq: Two times a day (BID) | INTRAMUSCULAR | Status: DC
  Administered 2015-08-19 – 2015-08-23 (×7): 3 mL via INTRAVENOUS

## 2015-08-19 MED ORDER — ACETAMINOPHEN 325 MG PO TABS: 650.0000 mg | ORAL_TABLET | Freq: Four times a day (QID) | ORAL | Status: DC | PRN

## 2015-08-19 MED ORDER — NYSTATIN 100000 UNIT/GM EX POWD
Freq: Two times a day (BID) | CUTANEOUS | Status: DC
  Administered 2015-08-19 – 2015-08-24 (×4): via TOPICAL
  Filled 2015-08-19 (×3): qty 15

## 2015-08-19 MED ORDER — MAGNESIUM OXIDE 400 (241.3 MG) MG PO TABS
200.0000 mg | ORAL_TABLET | Freq: Every day | ORAL | Status: DC
  Administered 2015-08-19 – 2015-08-24 (×6): 200 mg via ORAL
  Filled 2015-08-19: qty 0.5
  Filled 2015-08-19 (×2): qty 1
  Filled 2015-08-19: qty 0.5
  Filled 2015-08-19: qty 1
  Filled 2015-08-19: qty 0.5

## 2015-08-19 MED ORDER — NITROGLYCERIN 0.4 MG SL SUBL: 0.4000 mg | SUBLINGUAL_TABLET | SUBLINGUAL | Status: DC | PRN

## 2015-08-19 MED ORDER — VITAMIN D 1000 UNITS PO TABS
1000.0000 [IU] | ORAL_TABLET | Freq: Every day | ORAL | Status: DC
  Administered 2015-08-19 – 2015-08-24 (×6): 1000 [IU] via ORAL
  Filled 2015-08-19 (×6): qty 1

## 2015-08-19 NOTE — Progress Notes (Signed)
[facility]  I was called to evaluate Ms. Kimberly Watson this evening for possible EKOS catheterization.  She had a viral gastroenteritis this week which was also associated with chest tightness and dyspnea.  She has not had recent surgery, hospitalization or immobilization.  She has never had a blood clot before.  The dyspnea progressed so she came to our ER  In the ER she was found to have a large blood clot that extends from her femoral vein down to the peroneal vein.  On CT chest she has a saddle pulmonary embolism that extends to the right and left lungs.  She has RV strain on echocardiogram and CT scan.  Her troponin is elevated  On my exam HR 70-80 BP 120's over 80's O2 saturation 90's on 2LNC  Gen: well appearing, sitting up in bed, no distress HEENT: NCAT, OP clear PULM: CTA B, normal effort CV: RRR, split S2, no JVD GI: belly soft, nontender MSK: normal bulk and tone Neuro: A&Ox4  CT chest images personally reviewed: large clot as I have described above  Impression: Idiopathic submassive PE Cor pulmonale, acute but hemodynamically stable DVT left legt  Discussion: Tough case.  She has known clot in three areas: large DVT left leg, saddle PE extending to both lungs, and a third clot on her tricuspid valve as described by Dr. Percival Spanish in our phone conversation this evening.  From a hemodynamic standpoint I do no think that catheter directed thrombolysis is indicated and carries a risk of dislodging the clot on the tricuspid valve.  However this very large clot in her left leg carries a high risk of making her situation worse should it embolize.  I worry that systemic thromoblysis could raise the risk of embolization as the leg clot is too big to completely dissolve with standard dosing of systemic TPA.  So after lengthy discussion with the patient, her son, radiology and cardiology I think that the best approach is to place a temporary IVC filter to reduce the risk of embolization from the  large clot in her left leg.  She will remain on anticoagulation indefinitely.  Plan summary: Systemic heparin IVC filter tomorrow morning NPO after midnight Would plan for NOAC for life as this is an idiopathic clot  My cc time 45 minutes  Kimberly Awkward, MD [place] Pager: [phone] Cell: [phone] After 3pm or if no response, call [phone]

## 2015-08-19 NOTE — ED Notes (Signed)
Hospitalist at bedside 

## 2015-08-19 NOTE — ED Notes (Signed)
RN to start another IV, will obtain blood then

## 2015-08-19 NOTE — H&P (Addendum)
[facility] History and Physical  Kimberly Watson [id number] DOB: 02-24-1941 DOA: 08/18/2015  Referring physician: ED physician PCP: Alesia Richards, MD  Specialists:   Chief Complaint: Shortness breath, dizziness, chest pressure, bilateral leg edema, generalized weakness.  HPI: Kimberly Watson is a 75 y.o. female with PMH of hypertension, hyperlipidemia, GERD, IBS, hiatal hernia, kidney stone, who presents with shortness of breath, dizziness and bilateral leg edema and chest pressure.  Patient reports that she has been having mild shortness breath in the past 2 years, which has worsened in the past 2 days, particularly when she has exertion. She does not have cough, fever or chills. She has chest pressure over substernal area and generalized weakness. No chest pain. No tenderness over calf areas. She also reports having dizziness. She had bilaterally ear pain which was transient, and has resolved completely now. Currently she does not have dizziness or vertigo. She does not feel room spinning around her. No unilateral weakness, numbness or tingling sensations. No vision change or hearing loss. She did not have ear discharge or ear ringing. She reports that she has IBS, and  always has loose stool which has not changed. No abdominal pain. She does not have dysuria or burning on urination, but not sure whether she has an increased frequency because she is on Lasix. She does not have hematuria, hematemesis or hematochezia.  In ED, patient was found to have elevated troponin 0.20, BNP 1021, positive urinalysis for UTI, INR 1.61, PTT 28, lipase 28, WBC 12.8, hypothermia with temperature 96.3, oxygen desaturated to 88% on room air, AKI. Chest x-ray is negative. D-dimer is elevated. Patient is admitted to the inpatient for further eval and treatment.  EKG: Independently reviewed. QTC 526, incomplete right bundle blockage, LAFB.  Where does patient live?   At home    Can patient  participate in ADLs? Some   Review of Systems:   General: no fevers, chills, no changes in body weight, has poor appetite, has fatigue HEENT: no blurry vision, hearing changes or sore throat. Had ear pain. Pulm: no dyspnea, no coughing, wheezing CV: has chest pressure, no palpitations Abd: no nausea, vomiting, abdominal pain, has diarrhea, no constipation GU: no dysuria, burning on urination, increased urinary frequency, hematuria  Ext: has leg edema Neuro: no unilateral weakness, numbness, or tingling, no vision change or hearing loss Skin: no rash MSK: No muscle spasm, no deformity, no limitation of range of movement in spin Heme: No easy bruising.  Travel history: No recent long distant travel.  Allergy:  Allergies  Allergen Reactions  . Fosamax [Alendronate Sodium]     esophagitis    Past Medical History  Diagnosis Date  . Hypertension   . Osteoporosis   . GERD (gastroesophageal reflux disease)   . Nephrolithiasis   . Cataract   . Elevated hemoglobin A1c   . Leiomyoma     adnexal Leiomyoma removal in 1/11    Past Surgical History  Procedure Laterality Date  . Joint replacement Left total knee  . Knee surgery    . Uterine fibroid surgery    . Cholecystectomy      Social History:  reports that she has never smoked. She has never used smokeless tobacco. She reports that she does not drink alcohol or use illicit drugs.  Family History:  Family History  Problem Relation Age of Onset  . Hypertension Mother   . Diabetes Mother   . Breast cancer Mother   . Congestive Heart Failure Mother   .  Osteoporosis Father      Prior to Admission medications   Medication Sig Start Date End Date Taking? Authorizing Provider  aspirin 81 MG tablet Take 81 mg by mouth every other day.   Yes Historical Provider, MD  calcium carbonate (OS-CAL) 600 MG TABS tablet Take 600 mg by mouth daily.   Yes Historical Provider, MD  cholecalciferol (VITAMIN D) 1000 UNITS tablet Take 1,000  Units by mouth 1 day or 1 dose. Take 3 tablets daily.   Yes Historical Provider, MD  clobetasol ointment (TEMOVATE) 0.05 % Apply topically 2 (two) times daily. 07/13/15  Yes Unk Pinto, MD  furosemide (LASIX) 40 MG tablet Take 40 mg by mouth daily.    Yes Historical Provider, MD  MAGNESIUM PO Take 1 tablet by mouth daily.   Yes Historical Provider, MD  metoprolol succinate (TOPROL-XL) 100 MG 24 hr tablet TAKE 1 TABLET (100 MG TOTAL) BY MOUTH DAILY. TAKE WITH OR IMMEDIATELY FOLLOWING A MEAL. 07/04/15  Yes Unk Pinto, MD  nystatin (MYCOSTATIN/NYSTOP) 100000 UNIT/GM POWD Apply to affected area after a shower daily or as needed 03/25/14  Yes Vicie Mutters, PA-C  OVER THE COUNTER MEDICATION Place 1 spray into both nostrils daily.   Yes Historical Provider, MD  oxymetazoline (AFRIN) 0.05 % nasal spray Place 1 spray into both nostrils 2 (two) times daily.   Yes Historical Provider, MD  raloxifene (EVISTA) 60 MG tablet TAKE 1 TABLET BY MOUTH EVERY DAY 05/28/15  Yes Starlyn Skeans, PA-C    Physical Exam: Filed Vitals:   08/19/15 0200 08/19/15 0215 08/19/15 0230 08/19/15 0300  BP: 110/79  106/87 137/94  Pulse: 88 90 90 93  Temp:      TempSrc:      Resp: 14 18 19 14   Height:      Weight:      SpO2: 96% 97% 97% 96%   General: Not in acute distress HEENT:       Eyes: PERRL, EOMI, no scleral icterus.       ENT: No discharge from the ears and nose, no pharynx injection, no tonsillar enlargement.        Neck: Difficult to assess JVD due to obesity, no bruit, no mass felt. Heme: No neck lymph node enlargement. Cardiac: S1/S2, RRR, No murmurs, No gallops or rubs. Pulm: No rales, wheezing, rhonchi or rubs. Abd: Soft, nondistended, nontender, no rebound pain, no organomegaly, BS present. Ext: Asymmetric leg edema, left 3+, right 2+. 2+DP/PT pulse bilaterally. Musculoskeletal: No joint deformities, No joint redness or warmth, no limitation of ROM in spin. Skin: No rashes.  Neuro: Alert,  oriented X3, cranial nerves II-XII grossly intact, muscle strength 5/5 in all extremities, sensation to light touch intact. Knee reflex 1+ bilaterally. Negative Babinski's sign. Normal finger to nose test. Psych: Patient is not psychotic, no suicidal or hemocidal ideation.  Labs on Admission:  Basic Metabolic Panel:  Recent Labs Lab 08/19/15 0041  NA 143  K 4.8  CL 113*  CO2 16*  GLUCOSE 192*  BUN 31*  CREATININE 1.52*  CALCIUM 9.1   Liver Function Tests:  Recent Labs Lab 08/19/15 0041  AST 200*  ALT 216*  ALKPHOS 102  BILITOT 0.9  PROT 7.4  ALBUMIN 4.0    Recent Labs Lab 08/19/15 0041  LIPASE 28   No results for input(s): AMMONIA in the last 168 hours. CBC:  Recent Labs Lab 08/19/15 0041  WBC 12.8*  HGB 14.7  HCT 46.8*  MCV 85.6  PLT 204  Cardiac Enzymes:  Recent Labs Lab 08/19/15 0041  TROPONINI 0.20*    BNP (last 3 results)  Recent Labs  08/19/15 0041  BNP 1021.6*    ProBNP (last 3 results) No results for input(s): PROBNP in the last 8760 hours.  CBG:  Recent Labs Lab 08/18/15 2331  GLUCAP 187*    Radiological Exams on Admission: Dg Chest 2 View  08/19/2015  CLINICAL DATA:  Acute onset of worsening shortness of breath on exertion. Sinus headache. Initial encounter. EXAM: CHEST  2 VIEW COMPARISON:  None. FINDINGS: The lungs are well-aerated and clear. There is no evidence of focal opacification, pleural effusion or pneumothorax. The heart is borderline enlarged. No acute osseous abnormalities are seen. Clips are noted within the right upper quadrant, reflecting prior cholecystectomy. IMPRESSION: Borderline cardiomegaly.  Lungs remain grossly clear. Electronically Signed   By: Garald Balding M.D.   On: 08/19/2015 01:03    Assessment/Plan Principal Problem:   Acute respiratory failure with hypoxia (HCC) Active Problems:   Essential hypertension   Mixed hyperlipidemia   GERD (gastroesophageal reflux disease)   Osteoporosis    Morbid obesity (HCC)   Shortness of breath   Dizziness   Chest pressure   Elevated troponin   NSTEMI (non-ST elevated myocardial infarction) ([place])   UTI (lower urinary tract infection)   Generalized weakness   AKI (acute kidney injury) ([place])   Acute respiratory failure with hypoxia ([place]): Etiology is not completely clear, differential diagnosis includes congestive heart failure given bilateral leg edema and elevated BNP, pulmonary embolism given asymmetric leg edema and elevated d-dimer, and ACS given elevated troponin. The most likely possibility is congestive heart failure, which caused demanding  ischemia, leading to elevated troponin, but PE can not be completely ruled out. We need to be extremely careful with diuresis, therefore I will give just one dose of Lasix 40 mg by IV and reevaluate patient's volume status in the morning, while pending V/Q scan.  -will admit to SDU -lasix 40 mg x k1 -start IV heparin -V/Q scan in AM -LE doppler to r/o DVT  NSTEMI: Patient has chest pressure and elevated troponin, likely due to demanding ischemia, but cannot rule out ACS completely. - cycle CE q6 x3 and repeat her EKG in the am  - Nitroglycerin, Morphine, and aspirin, lipitor, metoprolol - Risk factor stratification: will check FLP, TSH and A1C  - please call cardiology consult in AM - 2d echo  HTN: -Metoprolol -On IV Lasix  HLD: Last LDL was 71 on 07/13/15. Not on medications at home. -Started Lipitor due to NSTEMI  Dizziness: Etiology is not clear, likely due to inner ear problem and given that she had transient ear pain. Other differential diagnoses include orthostatic status and posterior circulation stroke. Patient states that she cannot do closed MRI (okay with open MRI). -CT head without contrast -On aspirin -Meclizine when necessary -Check orthostatic vital signs -PT OT  Possible UTI (lower urinary tract infection): Patient has positive urinalysis for UTI. She does not have  dysuria or burning on urination. She is not sure if she has an increased urinary frequency due to Lasix use. She has generalized weakness, which might be related to possible UTI. -IV Rocephin -Follow-up blood culture and the urine culture -will get Procalcitonin and trend lactic acid levels  Addendum:  Sepsis: patient's lactic acid is elevated at 4.0. This is at least partially due to AKI,, but pt meets current dysuria for sepsis. Patient received one dose of Lasix at 3:30 possible CHF,  will have to hold diuretics now -start gentle IVF: NS 75 cc/h -trend Lactic acid -discussed with Dr. Ralene Bathe, she will consult to PCCM.    AKI (acute kidney injury) ([place]): Likely due to multifactorial etiology, including UTI, cardiorenal syndrome and diuretics use. - IVF as above - Check FeUrea - Follow up renal function by BMP - Avoid ACEI and NSAIDs   DVT ppx: on IV Heparin    Code Status: Full code Family Communication: None at bed side.   Disposition Plan: Admit to inpatient   Date of Service 08/19/2015    Ivor Costa [facility] Pager [phone]  If 7PM-7AM, please contact night-coverage [email or web address] Password [email or web address] 08/19/2015, 3:35 AM

## 2015-08-19 NOTE — Progress Notes (Signed)
ANTICOAGULATION CONSULT NOTE - Follow Up Consult  Pharmacy Consult for Heparin IV Indication: PE, DVT  Allergies  Allergen Reactions  . Fosamax [Alendronate Sodium]     esophagitis    Patient Measurements: Height: 5\' 2"  (157.5 cm) Weight: 176 lb (79.833 kg) IBW/kg (Calculated) : 50.1 Heparin Dosing Weight: 67  Vital Signs: Temp: 98.8 F (37.1 C) (01/19 1331) Temp Source: Oral (01/19 1331) BP: 116/89 mmHg (01/19 1331) Pulse Rate: 81 (01/19 1331)  Labs:  Recent Labs  08/19/15 0041 08/19/15 0407 08/19/15 0900 08/19/15 1303  HGB 14.7 15.0  --   --   HCT 46.8* 47.8*  --   --   PLT 204 204  --   --   APTT 28  --   --   --   LABPROT 15.9*  --   --   --   INR 1.26  --   --   --   HEPARINUNFRC  --   --   --  0.47  CREATININE 1.52* 1.37*  --   --   TROPONINI 0.20* 0.25* 0.23*  --     Estimated Creatinine Clearance: 34.7 mL/min (by C-G formula based on Cr of 1.37).   Medications:  Infusions:  . sodium chloride 75 mL/hr at 08/19/15 0602  . cefTRIAXone (ROCEPHIN)  IV    . heparin 1,100 Units/hr (08/19/15 0346)    Assessment: 75 yo Kimberly Watson with sob , mild, x 2 years who presents [facility] ED 1/18 with acute gasping for breath with exertion and chest pressure. No reports of of recent leg injuries, long drives in a car, no family history of PE.  Elevated D-Dimer  Pharmacy consulted to dose IV heparin for pulmonary embolism  Baseline INR, aPTT: wnl  Prior anticoagulation: none  Significant events: 1/19: VQ scan intermed probability of PE 1/19 venous duplex shows extensive LLE DVT  Today, 08/19/2015:  CBC: wnl  Most recent heparin level therapeutic on 1100 units/hr  No bleeding or infusion issues per nursing  CrCl: 35 ml/min  Goal of Therapy: Heparin level 0.3-0.7 units/ml Monitor platelets by anticoagulation protocol: Yes  Plan:  Continue heparin IV infusion at 1100 units/hr  Recheck confirmatory heparin level in 8 hrs  Daily CBC, daily heparin level once  stable  Monitor for signs of bleeding or thrombosis   Reuel Boom, PharmD Pager: [phone] 08/19/2015, 1:53 PM

## 2015-08-19 NOTE — Progress Notes (Signed)
ANTIBIOTIC CONSULT NOTE - INITIAL  Pharmacy Consult for vancomycin Indication: endocarditis  Allergies  Allergen Reactions  . Fosamax [Alendronate Sodium]     esophagitis    Patient Measurements: Height: 5\' 2"  (157.5 cm) Weight: 176 lb (79.833 kg) IBW/kg (Calculated) : 50.1   Vital Signs: Temp: 97.7 F (36.5 C) (01/19 1540) Temp Source: Oral (01/19 1540) BP: 126/76 mmHg (01/19 1540) Pulse Rate: 78 (01/19 1540) Intake/Output from previous day:   Intake/Output from this shift: Total I/O In: -  Out: 450 [Urine:450]  Labs:  Recent Labs  08/19/15 0041 08/19/15 0130 08/19/15 0407  WBC 12.8*  --  13.1*  HGB 14.7  --  15.0  PLT 204  --  204  LABCREA  --  338.48  --   CREATININE 1.52*  --  1.37*   Estimated Creatinine Clearance: 34.7 mL/min (by C-G formula based on Cr of 1.37). No results for input(s): VANCOTROUGH, VANCOPEAK, VANCORANDOM, GENTTROUGH, GENTPEAK, GENTRANDOM, TOBRATROUGH, TOBRAPEAK, TOBRARND, AMIKACINPEAK, AMIKACINTROU, AMIKACIN in the last 72 hours.   Microbiology: Recent Results (from the past 720 hour(s))  Culture, blood (Routine X 2) w Reflex to ID Panel     Status: None (Preliminary result)   Collection Time: 08/19/15  4:00 AM  Result Value Ref Range Status   Specimen Description   Final    BLOOD LEFT HAND Performed at [facility]  Final   Culture PENDING  Incomplete   Report Status PENDING  Incomplete  Culture, blood (Routine X 2) w Reflex to ID Panel     Status: None (Preliminary result)   Collection Time: 08/19/15  4:18 AM  Result Value Ref Range Status   Specimen Description   Final    BLOOD RIGHT HAND Performed at [facility]    Special Requests BOTTLES DRAWN AEROBIC ONLY 5CC  Final   Culture PENDING  Incomplete   Report Status PENDING  Incomplete    Medical History: Past Medical History  Diagnosis Date  . Hypertension   . Osteoporosis   . GERD (gastroesophageal reflux  disease)   . Nephrolithiasis   . Cataract   . Elevated hemoglobin A1c   . Leiomyoma     adnexal Leiomyoma removal in 1/11    Medications:  Scheduled:  . aspirin  81 mg Oral QODAY  . calcium carbonate  1,250 mg Oral Daily  . cholecalciferol  1,000 Units Oral Daily  . clobetasol ointment   Topical BID  . magnesium oxide  200 mg Oral Daily  . metoprolol succinate  100 mg Oral Daily  . nystatin   Topical BID  . oxymetazoline  1 spray Each Nare BID  . sodium chloride  3 mL Intravenous Q12H   Infusions:  . sodium chloride 75 mL/hr at 08/19/15 0602  . [START ON 08/20/2015] cefTRIAXone (ROCEPHIN)  IV    . heparin 1,100 Units/hr (08/19/15 0346)   Assessment: 75 yr female with complaint of abdominal pain. H&P in progress at this time. Patient on no oral anticoagulation PTA. Elevated D-Dimer. Pharmacy consulted to dose IV heparin for pulmonary embolism. Now to start vancomycin per pharmacy in addition to Rocephin already started by Md for possible endocarditis per ECHO results. Patient to be transferred to [facility] per notes. Tmin 96.3, WBC 13.1, SCr 1.37 with CrCl of 35 ml/min at baseline  Goal of Therapy:  Vancomycin trough level 15-20 mcg/ml  Plan:  1) Vancomycin 1g IV q24 per current weight and renal function  2) Will check trough at steady state as appropriate   Adrian Saran, PharmD, BCPS Pager [phone] 08/19/2015 4:21 PM

## 2015-08-19 NOTE — ED Notes (Signed)
Phlebotomy at bedside.

## 2015-08-19 NOTE — ED Notes (Signed)
Called lab and Amy stated that they are able to add on the APTT and Protime-INR to previously collected blood.

## 2015-08-19 NOTE — Progress Notes (Signed)
    Patient with DVT.  Echo done this afternoon suggested a possible mass on the tricuspid valve (possible vegetation).  However, I reviewed the echo with several other cardiologists and this could very likely be thrombus bridging the tricuspid valve.  Given this I ordered a CT and discussed with CCM MD covering, Dr. Wendee Beavers and ED MD.  She will need to be considered for thrombolysis.  However, given the possibility of thrombus in the RV/RA consideration will need to be given to systemic thrombolysis.  I did discuss this with the patient as well.

## 2015-08-19 NOTE — ED Notes (Signed)
ICU RN at bedside to talk with pt.

## 2015-08-19 NOTE — ED Notes (Addendum)
Blaine Hamper, MD made aware of patient's lactic acid and troponin levels. No new orders at this time.

## 2015-08-19 NOTE — ED Notes (Signed)
Pt returned from VQ scan with tech.

## 2015-08-19 NOTE — ED Notes (Signed)
Lactic of 2.5  Notified primary nurse

## 2015-08-19 NOTE — Progress Notes (Addendum)
*  Preliminary Results* Bilateral lower extremity venous duplex completed. The right lower extremity is negative for deep vein thrombosis. The left lower extremity is positive for acute deep vein thrombosis involving the left mid femoral vein, popliteal vein, posterior tibial veins, and peroneal veins. There is no evidence of Baker's cyst bilaterally.  Preliminary results discussed with Dr. Wendee Beavers.  08/19/2015  Maudry Mayhew, RVT, RDCS, RDMS

## 2015-08-19 NOTE — ED Notes (Signed)
Patient attempting urine sample.

## 2015-08-19 NOTE — Progress Notes (Signed)
Echocardiogram 2D Echocardiogram has been performed.  Kimberly Watson 08/19/2015, 3:19 PM

## 2015-08-19 NOTE — ED Notes (Signed)
MD at bedside. 

## 2015-08-19 NOTE — ED Notes (Signed)
Patient transported to X-ray 

## 2015-08-19 NOTE — ED Notes (Signed)
[facility] ARRIVED TO TRANSPORT PT TO [place]. AAOX3. PT IN NO APPARENT DISTRESS OR PAIN. THE OPPORTUNITY TO ASK QUESTIONS WAS PROVIDED.

## 2015-08-19 NOTE — ED Notes (Signed)
Pt in radiology, will reassess, and draw heparine w/ repeat troponin @ 1511

## 2015-08-19 NOTE — Progress Notes (Signed)
Patient seen and evaluated earlier this a.m. by my associate. Please refer to H&P for details regarding assessment and plan. Of note patient had positive DVT at left lower extremity. Per my discussion with cardiologist it is suspected patient may have undergone a recent PE. She is currently anticoagulated on heparin IV. 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  We'll transfer patient over to [facility]. There is no clear reason why patient would have endocarditis however and this may just be all related to clot burden. But at this juncture patient will require further evaluation and as such will broaden antibiotic coverage. Plan will be to obtain blood culture prior to broadening antibiotic coverage  Gen.: Patient in no acute distress, alert and awake Cardiovascular: S1 and S2 present, no rubs Pulmonary: Equal chest rise, no wheezes  Will have someone of our team evaluate him next am. Or sooner should any new concerns arise.  Chardae Mulkern, [employer]

## 2015-08-19 NOTE — Consult Note (Signed)
Name: Kimberly Watson MRN: [id number] DOB: 07/07/1941    ADMISSION DATE:  08/18/2015 CONSULTATION DATE:  1/19  REFERRING MD :  [facility]  CHIEF COMPLAINT:  SOB  BRIEF PATIENT DESCRIPTION:  75 yo WF with SOB  SIGNIFICANT EVENTS    STUDIES:  1/19 VQ>> 1/19 2d>>   HISTORY OF PRESENT ILLNESS:   75 yo Kimberly Watson with sob , mild, x 2 years who presents to [facility] ED 1/18 with acute gasping for breath with exertion and chest pressure. No reports of of recent leg injuries, long drives in a car, no family history of PE. She does notes recent diarrhea from presumed food poisoning while she was on lasix. In ER her Creatine is marginal, 1.37 nl 0.52, Co2 16(?bicarb loss from diarrhea). She reports feeling better since admit, Currently she is on heparin for presumed PE and VQ scan has been ordered. PCCM asked to evaluate. Her lactic acid 4.0-> 2.5 with fluid and stopping lasix. PCCM will follow.  PAST MEDICAL HISTORY :   has a past medical history of Hypertension; Osteoporosis; GERD (gastroesophageal reflux disease); Nephrolithiasis; Cataract; Elevated hemoglobin A1c; and Leiomyoma.  has past surgical history that includes Joint replacement (Left, total knee); Knee surgery; Uterine fibroid surgery; and Cholecystectomy. Prior to Admission medications   Medication Sig Start Date End Date Taking? Authorizing Provider  aspirin 81 MG tablet Take 81 mg by mouth every other day.   Yes Historical Provider, MD  calcium carbonate (OS-CAL) 600 MG TABS tablet Take 600 mg by mouth daily.   Yes Historical Provider, MD  cholecalciferol (VITAMIN D) 1000 UNITS tablet Take 1,000 Units by mouth 1 day or 1 dose. Take 3 tablets daily.   Yes Historical Provider, MD  clobetasol ointment (TEMOVATE) 0.05 % Apply topically 2 (two) times daily. 07/13/15  Yes Unk Pinto, MD  furosemide (LASIX) 40 MG tablet Take 40 mg by mouth daily.    Yes Historical Provider, MD  MAGNESIUM PO Take 1 tablet by mouth daily.   Yes Historical  Provider, MD  metoprolol succinate (TOPROL-XL) 100 MG 24 hr tablet TAKE 1 TABLET (100 MG TOTAL) BY MOUTH DAILY. TAKE WITH OR IMMEDIATELY FOLLOWING A MEAL. 07/04/15  Yes Unk Pinto, MD  nystatin (MYCOSTATIN/NYSTOP) 100000 UNIT/GM POWD Apply to affected area after a shower daily or as needed 03/25/14  Yes Vicie Mutters, PA-C  OVER THE COUNTER MEDICATION Place 1 spray into both nostrils daily.   Yes Historical Provider, MD  oxymetazoline (AFRIN) 0.05 % nasal spray Place 1 spray into both nostrils 2 (two) times daily.   Yes Historical Provider, MD  raloxifene (EVISTA) 60 MG tablet TAKE 1 TABLET BY MOUTH EVERY DAY 05/28/15  Yes Courtney Forcucci, PA-C   Allergies  Allergen Reactions  . Fosamax [Alendronate Sodium]     esophagitis    FAMILY HISTORY:  family history includes Breast cancer in her mother; Congestive Heart Failure in her mother; Diabetes in her mother; Hypertension in her mother; Osteoporosis in her father. SOCIAL HISTORY:  reports that she has never smoked. She has never used smokeless tobacco. She reports that she does not drink alcohol or use illicit drugs.  REVIEW OF SYSTEMS:   10 point review of system taken, please see HPI for positives and negatives. SUBJECTIVE:   VITAL SIGNS: Temp:  [96.3 F (35.7 C)-97.7 F (36.5 C)] 97.7 F (36.5 C) (01/19 0844) Pulse Rate:  [80-97] 86 (01/19 0844) Resp:  [14-22] 18 (01/19 0844) BP: (100-138)/(77-103) 138/103 mmHg (01/19 0844) SpO2:  [88 %-97 %]  95 % (01/19 0844) Weight:  [176 lb (79.833 kg)] 176 lb (79.833 kg) (01/18 2315)  PHYSICAL EXAMINATION: General:  Awake and alert, moderate kyphosis Neuro:  Intact, follows commands.A&O x 4 HEENT: No jvd/LAN Cardiovascular:  HSR RRR Lungs:  CTA, decreased in bases Abdomen:  Soft, NT Musculoskeletal:  intact Skin:  Lower ext + edema   Recent Labs Lab 08/19/15 0041 08/19/15 0407  NA 143 143  K 4.8 4.9  CL 113* 109  CO2 16* 16*  BUN 31* 30*  CREATININE 1.52* 1.37*    GLUCOSE 192* 137*    Recent Labs Lab 08/19/15 0041 08/19/15 0407  HGB 14.7 15.0  HCT 46.8* 47.8*  WBC 12.8* 13.1*  PLT 204 204   Dg Chest 2 View  08/19/2015  CLINICAL DATA:  Acute onset of worsening shortness of breath on exertion. Sinus headache. Initial encounter. EXAM: CHEST  2 VIEW COMPARISON:  None. FINDINGS: The lungs are well-aerated and clear. There is no evidence of focal opacification, pleural effusion or pneumothorax. The heart is borderline enlarged. No acute osseous abnormalities are seen. Clips are noted within the right upper quadrant, reflecting prior cholecystectomy. IMPRESSION: Borderline cardiomegaly.  Lungs remain grossly clear. Electronically Signed   By: Garald Balding M.D.   On: 08/19/2015 01:03   Ct Head Wo Contrast  08/19/2015  CLINICAL DATA:  75 year old female with dizziness and vertigo EXAM: CT HEAD WITHOUT CONTRAST TECHNIQUE: Contiguous axial images were obtained from the base of the skull through the vertex without intravenous contrast. COMPARISON:  None. FINDINGS: The ventricles and sulci are appropriate in size for patient's age. Mild periventricular and deep white matter hypodensities represent chronic microvascular ischemic changes. There is no intracranial hemorrhage. No mass effect or midline shift identified. A 6 x 8 mm calcific density along the falx may represent a small calcified meningioma. The visualized paranasal sinuses and mastoid air cells are well aerated. The calvarium is intact. IMPRESSION: No acute intracranial hemorrhage. Mild age-related atrophy and chronic microvascular ischemic disease. If symptoms persist and there are no contraindications, MRI may provide better evaluation if clinically indicated Electronically Signed   By: Anner Crete M.D.   On: 08/19/2015 04:43    ASSESSMENT     Acute respiratory failure with hypoxia (HCC)   Shortness of breath over 2 years with acute sob x 2 days    AKI (acute kidney injury) ([place])   Diarrhea  from food ingestion presumed poison  .   Tachypnea with excertion   Essential hypertension   Mixed hyperlipidemia   GERD (gastroesophageal reflux disease)   Osteoporosis   Morbid obesity (HCC)   Dizziness   Chest pressure   Elevated troponin   NSTEMI (non-ST elevated myocardial infarction) (HCC)   UTI (lower urinary tract infection)   Generalized weakness  Disscusion: 74 yo [place] with sob , mild, x 2 years who presents to [facility] ED 1/18 with acute gasping for breath with exertion and chest pressure. No reports of of recent leg injuries, long drives in a car, no family history of PE. She does notes recent diarrhea from presumed food poisoning while she was on lasix. In ER her Creatine is marginal, 1.37 nl 0.52, Co2 16(?bicarb loss from diarrhea). She reports feeling better since admit, Currently she is on heparin for presumed PE and VQ scan has been ordered. PCCM asked to evaluate. Her lactic acid 4.0-> 2.5 with fluid and stopping lasix. PCCM will follow.     PLAN: Agree with IV Fluid and SDU admit Await  VQ and LEDS results Follow creatine Hold diuresis Consider holding BB for now now in case she becomes septic Follow CO2 level for possible bicarb replacement Trend lactic acid already clearing with fluid   Richardson Landry Tesha Archambeau ACNP Maryanna Shape PCCM Pager [phone] till 3 pm If no answer page [phone] 08/19/2015, 9:47 AM

## 2015-08-19 NOTE — ED Notes (Signed)
NM called and stated that it will be about 09:30/10:00 until they can get to her.

## 2015-08-19 NOTE — ED Notes (Signed)
Phlebotomy notified of unsuccessful blood draw by 2 RNs.

## 2015-08-19 NOTE — ED Notes (Signed)
Pt used bedpan to void.  She was able to place it underneath her without assistance and then did her own pericare.  She was able to move herself up in the bed did get a bit "winded" but recovered quickly.  Family is at bedside and pt is using her cellphone without distress.

## 2015-08-19 NOTE — Consult Note (Signed)
CARDIOLOGY CONSULT NOTE  Patient ID: Kimberly Watson MRN: [id number] DOB/AGE: February 01, 1941 75 y.o.  Admit date: 08/18/2015 Primary Physician Alesia Richards, MD Primary Cardiologist None Chief Complaint  SOB  HPI:  The patient presents with dyspnea.  She has no past cardiac history.  She reports about 3 days of what she thought might be a GI virus.  There was some diarrhea and some discomfort.  On Tuesday she became acutely SOB walking just a few feet in her house.  She finally called 911 last night.  The patient denies any new symptoms such as chest discomfort, neck or arm discomfort. There has been no new  PND or orthopnea. She has felt light headed when she tried to ambulate and she reported a rapid heart rate walking through her house.  In the ED the patient has a mildly elevated troponin.  BNP is increased.  O2 sats were decreased to 88% on room air.    VQ was intermediate prob.  Lower extremity venous Doppler was preliminarily positive for DVT.    Past Medical History  Diagnosis Date  . Hypertension   . Osteoporosis   . GERD (gastroesophageal reflux disease)   . Nephrolithiasis   . Cataract   . Elevated hemoglobin A1c   . Leiomyoma     adnexal Leiomyoma removal in 1/11    Past Surgical History  Procedure Laterality Date  . Joint replacement Left total knee  . Knee surgery    . Uterine fibroid surgery    . Cholecystectomy      Allergies  Allergen Reactions  . Fosamax [Alendronate Sodium]     esophagitis   Prior to Admission medications   Medication Sig Start Date End Date Taking? Authorizing Provider  aspirin 81 MG tablet Take 81 mg by mouth every other day.   Yes Historical Provider, MD  calcium carbonate (OS-CAL) 600 MG TABS tablet Take 600 mg by mouth daily.   Yes Historical Provider, MD  cholecalciferol (VITAMIN D) 1000 UNITS tablet Take 1,000 Units by mouth 1 day or 1 dose. Take 3 tablets daily.   Yes Historical Provider, MD  clobetasol ointment (TEMOVATE)  0.05 % Apply topically 2 (two) times daily. 07/13/15  Yes Unk Pinto, MD  furosemide (LASIX) 40 MG tablet Take 40 mg by mouth daily.    Yes Historical Provider, MD  MAGNESIUM PO Take 1 tablet by mouth daily.   Yes Historical Provider, MD  metoprolol succinate (TOPROL-XL) 100 MG 24 hr tablet TAKE 1 TABLET (100 MG TOTAL) BY MOUTH DAILY. TAKE WITH OR IMMEDIATELY FOLLOWING A MEAL. 07/04/15  Yes Unk Pinto, MD  nystatin (MYCOSTATIN/NYSTOP) 100000 UNIT/GM POWD Apply to affected area after a shower daily or as needed 03/25/14  Yes Vicie Mutters, PA-C  OVER THE COUNTER MEDICATION Place 1 spray into both nostrils daily.   Yes Historical Provider, MD  oxymetazoline (AFRIN) 0.05 % nasal spray Place 1 spray into both nostrils 2 (two) times daily.   Yes Historical Provider, MD  raloxifene (EVISTA) 60 MG tablet TAKE 1 TABLET BY MOUTH EVERY DAY 05/28/15  Yes Starlyn Skeans, PA-C      (Not in a hospital admission) Family History  Problem Relation Age of Onset  . Hypertension Mother   . Diabetes Mother   . Breast cancer Mother   . Congestive Heart Failure Mother   . Osteoporosis Father     Social History   Social History  . Marital Status: Divorced    Spouse Name: N/A  . Number  of Children: N/A  . Years of Education: N/A   Occupational History  . Not on file.   Social History Main Topics  . Smoking status: Never Smoker   . Smokeless tobacco: Never Used  . Alcohol Use: No  . Drug Use: No  . Sexual Activity: Not on file   Other Topics Concern  . Not on file   Social History Narrative     ROS:  Sinus complaints, loss of height leading to compression of her abdomen and a constant feeling of abdominal fullness.  Otherwise ,as stated in the HPI and negative for all other systems.  Physical Exam: Blood pressure 116/89, pulse 81, temperature 98.8 F (37.1 C), temperature source Oral, resp. rate 14, height 5\' 2"  (1.575 m), weight 176 lb (79.833 kg), SpO2 96 %.  GENERAL:  Well  appearing HEENT:  Pupils equal round and reactive, fundi not visualized, oral mucosa unremarkable NECK:  Positive jugular venous distention, waveform within normal limits, carotid upstroke brisk and symmetric, no bruits, no thyromegaly LYMPHATICS:  No cervical, inguinal adenopathy LUNGS:  Clear to auscultation bilaterally BACK:  No CVA tenderness CHEST:  Unremarkable HEART:  PMI not displaced or sustained,S1 and S2 within normal limits, no S3, no S4, no clicks, no rubs, no murmurs ABD:  Flat, positive bowel sounds normal in frequency in pitch, no bruits, no rebound, no guarding, no midline pulsatile mass, no hepatomegaly, no splenomegaly EXT:  2 plus pulses throughout, no edema, no cyanosis no clubbing SKIN:  No rashes no nodules NEURO:  Cranial nerves II through XII grossly intact, motor grossly intact throughout PSYCH:  Cognitively intact, oriented to person place and time   Labs: Lab Results  Component Value Date   BUN 30* 08/19/2015   Lab Results  Component Value Date   CREATININE 1.37* 08/19/2015   Lab Results  Component Value Date   NA 143 08/19/2015   K 4.9 08/19/2015   CL 109 08/19/2015   CO2 16* 08/19/2015   Lab Results  Component Value Date   TROPONINI 0.23* 08/19/2015   Lab Results  Component Value Date   WBC 13.1* 08/19/2015   HGB 15.0 08/19/2015   HCT 47.8* 08/19/2015   MCV 85.5 08/19/2015   PLT 204 08/19/2015   Lab Results  Component Value Date   CHOL 146 08/19/2015   HDL 54 08/19/2015   LDLCALC 72 08/19/2015   TRIG 98 08/19/2015   CHOLHDL 2.7 08/19/2015   Lab Results  Component Value Date   ALT 216* 08/19/2015   AST 200* 08/19/2015   ALKPHOS 102 08/19/2015   BILITOT 0.9 08/19/2015    Radiology:   CXR:    The lungs are well-aerated and clear. There is no evidence of focal opacification, pleural effusion or pneumothorax. The heart is borderline enlarged. No acute osseous abnormalities are seen. Clips are noted within the right upper quadrant,  reflecting prior cholecystectomy.  [id number]:  Intermediate probability of pulmonary embolus by PIOPED 2 criteria (20 - 79% risk of pulmonary embolus), with 1 large mismatched perfusion defect in the left upper lobe, and a matched large right upper lobe perfusion and ventilation defect.   EKG:  NSR, rate 92, incomplete RBBB, LAFB, QTc prolonged, no acute ST T wave changes.  08/19/2015  ASSESSMENT AND PLAN:   ELEVATED TROPONIN:  Doubt acute coronary syndrome.  This could be related to a PE which is likely (and I would suspect large).  Also could be acute cardiomyopathy related to perhaps a GI viral illness.  I think this is less likely.  Continue heparin.  Check echo and cycle enzymes.  Probable non invasive ischemia study electively pending the echo results.  Hold off on DOAC until we see the echo.   DYSPNEA:   Acute.  Probable PE given positive DVT preliminarily reported on venous ultrasound.  The BNP is more elevated than I would expect with a PE.  Increased RV wall stress from a PE could increase the BNP but this is typically very mild.  Check echocardiogram.    I would not suggest further diuresis.  If she has increased pulmonary pressures creat will be sensitive to RV filling pressures.    HTN:  Follow BP   CKD:  Follow creat.   SignedMinus Breeding 08/19/2015, 1:52 PM

## 2015-08-19 NOTE — Progress Notes (Signed)
[facility] ED unit secreatry came to ED Cm to updated her that ED Rn spoke with CV and wants pt transferred to [facility] ICU level for Pulmonary embolism

## 2015-08-19 NOTE — ED Notes (Signed)
Delay in lab draw pt using bathroom

## 2015-08-19 NOTE — ED Provider Notes (Signed)
Pt admitted to SDU.  Lactate elevation to 4 noted. Concern for possible shock due to PE vs CHF.  D/w Dr. Blaine Hamper.  PCCM consulted and agree to see patient.    [name], MD 08/19/15 [phone]

## 2015-08-19 NOTE — ED Notes (Signed)
Patient transported to CT 

## 2015-08-19 NOTE — Progress Notes (Signed)
ANTICOAGULATION CONSULT NOTE - Initial Consult  Pharmacy Consult for Heparin Indication: pulmonary embolus  Allergies  Allergen Reactions  . Fosamax [Alendronate Sodium]     esophagitis    Patient Measurements: Height: 5\' 2"  (157.5 cm) Weight: 176 lb (79.833 kg) IBW/kg (Calculated) : 50.1 Heparin Dosing Weight: 67 kg  Vital Signs: Temp: 96.3 F (35.7 C) (01/18 2315) Temp Source: Axillary (01/18 2315) BP: 106/87 mmHg (01/19 0230) Pulse Rate: 90 (01/19 0230)  Labs:  Recent Labs  08/19/15 0041  HGB 14.7  HCT 46.8*  PLT 204  CREATININE 1.52*  TROPONINI 0.20*    Estimated Creatinine Clearance: 31.3 mL/min (by C-G formula based on Cr of 1.52).   Medical History: Past Medical History  Diagnosis Date  . Hypertension   . Osteoporosis   . GERD (gastroesophageal reflux disease)   . Nephrolithiasis   . Cataract   . Elevated hemoglobin A1c   . Leiomyoma     adnexal Leiomyoma removal in 1/11    Medications:  Scheduled:  . heparin  3,000 Units Intravenous Once   Infusions:  . cefTRIAXone (ROCEPHIN)  IV    . heparin      Assessment:  75 yr female with complaint of abdominal pain  H&P in progress at this time  Patient on no oral anticoagulation PTA  Elevated D-Dimer  Pharmacy consulted to dose IV heparin for pulmonary embolism  Goal of Therapy:  Heparin level 0.3-0.7 units/ml Monitor platelets by anticoagulation protocol: Yes   Plan:   Obtain baseline aPTT and PT/INR  Heparin 3000 unit IV bolus x 1 followed by infusion @ 1100 units/hr  Check heparin level 8 hr after heparin started  Follow CBC and heparin level daily  Bonetta Mostek, Toribio Harbour, PharmD 08/19/2015,2:45 AM

## 2015-08-20 ENCOUNTER — Encounter (HOSPITAL_COMMUNITY): Payer: Self-pay | Admitting: *Deleted

## 2015-08-20 ENCOUNTER — Inpatient Hospital Stay (HOSPITAL_COMMUNITY): Payer: Medicare Other

## 2015-08-20 LAB — PHOSPHORUS: Phosphorus: 3.9 mg/dL (ref 2.5–4.6)

## 2015-08-20 LAB — CBC
HCT: 40.5 % (ref 36.0–46.0)
Hemoglobin: 12.6 g/dL (ref 12.0–15.0)
MCH: 26.5 pg (ref 26.0–34.0)
MCHC: 31.1 g/dL (ref 30.0–36.0)
MCV: 85.1 fL (ref 78.0–100.0)
Platelets: 183 10*3/uL (ref 150–400)
RBC: 4.76 MIL/uL (ref 3.87–5.11)
RDW: 15.4 % (ref 11.5–15.5)
WBC: 10.4 10*3/uL (ref 4.0–10.5)

## 2015-08-20 LAB — BASIC METABOLIC PANEL
Anion gap: 11 (ref 5–15)
BUN: 26 mg/dL — ABNORMAL HIGH (ref 6–20)
CO2: 23 mmol/L (ref 22–32)
Calcium: 8.8 mg/dL — ABNORMAL LOW (ref 8.9–10.3)
Chloride: 109 mmol/L (ref 101–111)
Creatinine, Ser: 1.16 mg/dL — ABNORMAL HIGH (ref 0.44–1.00)
GFR calc Af Amer: 52 mL/min — ABNORMAL LOW (ref >60)
GFR calc non Af Amer: 45 mL/min — ABNORMAL LOW (ref >60)
Glucose, Bld: 95 mg/dL (ref 65–99)
Potassium: 3.8 mmol/L (ref 3.5–5.1)
Sodium: 143 mmol/L (ref 135–145)

## 2015-08-20 LAB — HEPARIN LEVEL (UNFRACTIONATED)
Heparin Unfractionated: 0.55 IU/mL (ref 0.30–0.70)
Heparin Unfractionated: 0.52 IU/mL (ref 0.30–0.70)

## 2015-08-20 LAB — HEMOGLOBIN A1C
Hgb A1c MFr Bld: 5.8 % — ABNORMAL HIGH (ref 4.8–5.6)
Mean Plasma Glucose: 120 mg/dL

## 2015-08-20 LAB — URINE CULTURE

## 2015-08-20 LAB — UREA NITROGEN, URINE: Urea Nitrogen, Ur: 326 mg/dL

## 2015-08-20 LAB — GLUCOSE, CAPILLARY
Glucose-Capillary: 82 mg/dL (ref 65–99)
Glucose-Capillary: 84 mg/dL (ref 65–99)

## 2015-08-20 LAB — MAGNESIUM: Magnesium: 1.8 mg/dL (ref 1.7–2.4)

## 2015-08-20 MED ORDER — LIDOCAINE HCL 1 % IJ SOLN
INTRAMUSCULAR | Status: AC
  Filled 2015-08-20: qty 20

## 2015-08-20 MED ORDER — MIDAZOLAM HCL 2 MG/2ML IJ SOLN
INTRAMUSCULAR | Status: AC
  Filled 2015-08-20: qty 4

## 2015-08-20 MED ORDER — FENTANYL CITRATE (PF) 100 MCG/2ML IJ SOLN
INTRAMUSCULAR | Status: AC | PRN
  Administered 2015-08-20: 50 ug via INTRAVENOUS

## 2015-08-20 MED ORDER — FENTANYL CITRATE (PF) 100 MCG/2ML IJ SOLN
INTRAMUSCULAR | Status: AC
  Filled 2015-08-20: qty 4

## 2015-08-20 MED ORDER — MAGNESIUM SULFATE 2 GM/50ML IV SOLN
2.0000 g | Freq: Once | INTRAVENOUS | Status: AC
  Administered 2015-08-20: 2 g via INTRAVENOUS
  Filled 2015-08-20: qty 50

## 2015-08-20 MED ORDER — MIDAZOLAM HCL 2 MG/2ML IJ SOLN
INTRAMUSCULAR | Status: AC | PRN
  Administered 2015-08-20: 1 mg via INTRAVENOUS

## 2015-08-20 NOTE — Sedation Documentation (Signed)
Patient denies pain and is resting comfortably.  

## 2015-08-20 NOTE — Progress Notes (Signed)
PT Cancellation Note  Patient Details Name: Kimberly Watson MRN: [id number] DOB: [birth year]   Cancelled Treatment:    Reason Eval/Treat Not Completed: Patient at procedure or test/unavailable Pt going to get IVC filter placed this AM. Will follow up as time allows.   Marguarite Arbour A Sri Clegg 08/20/2015, 9:17 AM Wray Kearns, PT, DPT [phone]

## 2015-08-20 NOTE — Progress Notes (Signed)
SUBJECTIVE:  No chest pain.  No SOB   PHYSICAL EXAM Filed Vitals:   08/20/15 0620 08/20/15 0700 08/20/15 0749 08/20/15 0800  BP: 95/69 124/81  124/86  Pulse: 59 92  74  Temp:   97.2 F (36.2 C)   TempSrc:   Oral   Resp: 14 26  27   Height:      Weight:      SpO2: 89% 96%  94%   General:  No distress Lungs:  Clear Heart:  RRR Abdomen:  Positive bowel sounds, no rebound no guarding Extremities:  Trace edema  LABS: Lab Results  Component Value Date   TROPONINI 0.20* 08/19/2015   Results for orders placed or performed during the hospital encounter of 08/18/15 (from the past 24 hour(s))  Troponin I (q 6hr x 3)     Status: Abnormal   Collection Time: 08/19/15  9:00 AM  Result Value Ref Range   Troponin I 0.23 (H) <0.031 ng/mL  Heparin level (unfractionated)     Status: None   Collection Time: 08/19/15  1:03 PM  Result Value Ref Range   Heparin Unfractionated 0.47 0.30 - 0.70 IU/mL  Troponin I (q 6hr x 3)     Status: Abnormal   Collection Time: 08/19/15  4:25 PM  Result Value Ref Range   Troponin I 0.20 (H) <0.031 ng/mL  MRSA PCR Screening     Status: None   Collection Time: 08/19/15  8:33 PM  Result Value Ref Range   MRSA by PCR NEGATIVE NEGATIVE  Glucose, capillary     Status: Abnormal   Collection Time: 08/19/15  9:20 PM  Result Value Ref Range   Glucose-Capillary 119 (H) 65 - 99 mg/dL  Heparin level (unfractionated)     Status: None   Collection Time: 08/19/15 10:50 PM  Result Value Ref Range   Heparin Unfractionated 0.55 0.30 - 0.70 IU/mL  CBC     Status: None   Collection Time: 08/20/15  2:58 AM  Result Value Ref Range   WBC 10.4 4.0 - 10.5 K/uL   RBC 4.76 3.87 - 5.11 MIL/uL   Hemoglobin 12.6 12.0 - 15.0 g/dL   HCT 40.5 36.0 - 46.0 %   MCV 85.1 78.0 - 100.0 fL   MCH 26.5 26.0 - 34.0 pg   MCHC 31.1 30.0 - 36.0 g/dL   RDW 15.4 11.5 - 15.5 %   Platelets 183 150 - 400 K/uL  Basic metabolic panel     Status: Abnormal   Collection Time: 08/20/15  2:58 AM   Result Value Ref Range   Sodium 143 135 - 145 mmol/L   Potassium 3.8 3.5 - 5.1 mmol/L   Chloride 109 101 - 111 mmol/L   CO2 23 22 - 32 mmol/L   Glucose, Bld 95 65 - 99 mg/dL   BUN 26 (H) 6 - 20 mg/dL   Creatinine, Ser 1.16 (H) 0.44 - 1.00 mg/dL   Calcium 8.8 (L) 8.9 - 10.3 mg/dL   GFR calc non Af Amer 45 (L) >60 mL/min   GFR calc Af Amer 52 (L) >60 mL/min   Anion gap 11 5 - 15  Magnesium     Status: None   Collection Time: 08/20/15  2:58 AM  Result Value Ref Range   Magnesium 1.8 1.7 - 2.4 mg/dL  Phosphorus     Status: None   Collection Time: 08/20/15  2:58 AM  Result Value Ref Range   Phosphorus 3.9 2.5 - 4.6 mg/dL  Heparin level (unfractionated)     Status: None   Collection Time: 08/20/15  2:58 AM  Result Value Ref Range   Heparin Unfractionated 0.52 0.30 - 0.70 IU/mL  Glucose, capillary     Status: None   Collection Time: 08/20/15  7:42 AM  Result Value Ref Range   Glucose-Capillary 82 65 - 99 mg/dL    Intake/Output Summary (Last 24 hours) at 08/20/15 0854 Last data filed at 08/20/15 0800  Gross per 24 hour  Intake   1020 ml  Output   1100 ml  Net    -80 ml    ASSESSMENT AND PLAN:  ACUTE PE:    RV strain and clot in transit.  Defer further decisions to CCM.  No acute cardiac process other than the resultant RV dysfunction. I would suggest repeat echo before discharge.  We will follow as needed.    Jeneen Rinks [facility] 08/20/2015 8:54 AM

## 2015-08-20 NOTE — Consult Note (Signed)
Chief Complaint: Patient was seen in consultation today for retrievable inferior vena cava filter placement Chief Complaint  Patient presents with  . Abdominal Pain  . Dizziness   at the request of Dr Lake Bells  Referring Physician(s): CCM  History of Present Illness: Kimberly Watson is a 75 y.o. female   Pt developed sudden worsening dyspnea 3 days ago N/Diarrhea Weakness; dizzy Presented to ED 08/19/15 Work up reveals : *Preliminary Results* Bilateral lower extremity venous duplex completed. The right lower extremity is negative for deep vein thrombosis. The left lower extremity is positive for acute deep vein thrombosis involving the left mid femoral vein, popliteal vein, posterior tibial veins, and peroneal veins. There is no evidence of Baker's cyst bilaterally.  CTA: IMPRESSION: 1. Positive for acute PE with CT evidence of right heart strain (RV/LV Ratio = 1.84) consistent with at least submassive (intermediate risk) PE. The presence of right heart strain has been associated with an increased risk of morbidity and mortality. Please activate Code PE by paging [phone]. 2. Trace amount of pericardial fluid and/or thickening, unlikely to be of any hemodynamic significance at this time.  Echo: Patient with DVT. Echo done this afternoon suggested a possible mass on the tricuspid valve (possible vegetation). However, I reviewed the echo with several other cardiologists and this could very likely be thrombus bridging the tricuspid valve.  Dr Lake Bells has requested retrievable inferior vena cava filter placement feels filter will reduce risk of embolization from large clot in LLE. I have seen and examined pt  Past Medical History  Diagnosis Date  . Hypertension   . Osteoporosis   . GERD (gastroesophageal reflux disease)   . Nephrolithiasis   . Cataract   . Elevated hemoglobin A1c   . Leiomyoma     adnexal Leiomyoma removal in 1/11  . H/O left knee surgery      Past Surgical History  Procedure Laterality Date  . Joint replacement Left total knee  . Uterine fibroid surgery    . Cholecystectomy      Allergies: Fosamax  Medications: Prior to Admission medications   Medication Sig Start Date End Date Taking? Authorizing Provider  aspirin 81 MG tablet Take 81 mg by mouth every other day.   Yes Historical Provider, MD  calcium carbonate (OS-CAL) 600 MG TABS tablet Take 600 mg by mouth daily.   Yes Historical Provider, MD  cholecalciferol (VITAMIN D) 1000 UNITS tablet Take 1,000 Units by mouth 1 day or 1 dose. Take 3 tablets daily.   Yes Historical Provider, MD  clobetasol ointment (TEMOVATE) 0.05 % Apply topically 2 (two) times daily. 07/13/15  Yes Unk Pinto, MD  furosemide (LASIX) 40 MG tablet Take 40 mg by mouth daily.    Yes Historical Provider, MD  MAGNESIUM PO Take 1 tablet by mouth daily.   Yes Historical Provider, MD  metoprolol succinate (TOPROL-XL) 100 MG 24 hr tablet TAKE 1 TABLET (100 MG TOTAL) BY MOUTH DAILY. TAKE WITH OR IMMEDIATELY FOLLOWING A MEAL. 07/04/15  Yes Unk Pinto, MD  nystatin (MYCOSTATIN/NYSTOP) 100000 UNIT/GM POWD Apply to affected area after a shower daily or as needed 03/25/14  Yes Vicie Mutters, PA-C  OVER THE COUNTER MEDICATION Place 1 spray into both nostrils daily.   Yes Historical Provider, MD  oxymetazoline (AFRIN) 0.05 % nasal spray Place 1 spray into both nostrils 2 (two) times daily.   Yes Historical Provider, MD  raloxifene (EVISTA) 60 MG tablet TAKE 1 TABLET BY MOUTH EVERY DAY 05/28/15  Yes  Starlyn Skeans, PA-C     Family History  Problem Relation Age of Onset  . Hypertension Mother   . Diabetes Mother   . Breast cancer Mother   . Congestive Heart Failure Mother     Died age 49  . Osteoporosis Father     Social History   Social History  . Marital Status: Divorced    Spouse Name: N/A  . Number of Children: 2  . Years of Education: N/A   Occupational History  . Teacher      Retired   Social History Main Topics  . Smoking status: Never Smoker   . Smokeless tobacco: Never Used  . Alcohol Use: No  . Drug Use: No  . Sexual Activity: Not Asked   Other Topics Concern  . None   Social History Narrative   Lives alone.  Lived with a smoker for years.       Review of Systems: A 12 point ROS discussed and pertinent positives are indicated in the HPI above.  All other systems are negative.  Review of Systems  Constitutional: Positive for activity change and fatigue. Negative for fever, diaphoresis and appetite change.  Respiratory: Positive for chest tightness and shortness of breath.   Cardiovascular: Positive for chest pain.  Gastrointestinal: Positive for diarrhea. Negative for abdominal pain.  Musculoskeletal: Negative for gait problem.  Skin: Negative for color change.  Neurological: Positive for weakness.  Psychiatric/Behavioral: Negative for behavioral problems and confusion.    Vital Signs: BP 124/81 mmHg  Pulse 92  Temp(Src) 97.6 F (36.4 C) (Oral)  Resp 26  Ht 5\' 2"  (1.575 m)  Wt 179 lb 0.2 oz (81.2 kg)  BMI 32.73 kg/m2  SpO2 96%  Physical Exam  Constitutional: She is oriented to person, place, and time. She appears well-nourished.  Cardiovascular: Normal rate, regular rhythm and normal heart sounds.   Pulmonary/Chest: Effort normal. She has no wheezes.  Abdominal: Soft. Bowel sounds are normal. There is no tenderness.  Musculoskeletal: Normal range of motion.  Neurological: She is alert and oriented to person, place, and time.  Skin: Skin is warm and dry.  Psychiatric: She has a normal mood and affect. Her behavior is normal. Judgment and thought content normal.  Nursing note and vitals reviewed.   Mallampati Score:  MD Evaluation Airway: WNL Heart: WNL Abdomen: WNL Chest/ Lungs: WNL ASA  Classification: 3 Mallampati/Airway Score: One  Imaging: Dg Chest 2 View  08/19/2015  CLINICAL DATA:  Acute onset of worsening  shortness of breath on exertion. Sinus headache. Initial encounter. EXAM: CHEST  2 VIEW COMPARISON:  None. FINDINGS: The lungs are well-aerated and clear. There is no evidence of focal opacification, pleural effusion or pneumothorax. The heart is borderline enlarged. No acute osseous abnormalities are seen. Clips are noted within the right upper quadrant, reflecting prior cholecystectomy. IMPRESSION: Borderline cardiomegaly.  Lungs remain grossly clear. Electronically Signed   By: Garald Balding M.D.   On: 08/19/2015 01:03   Ct Head Wo Contrast  08/19/2015  CLINICAL DATA:  75 year old female with dizziness and vertigo EXAM: CT HEAD WITHOUT CONTRAST TECHNIQUE: Contiguous axial images were obtained from the base of the skull through the vertex without intravenous contrast. COMPARISON:  None. FINDINGS: The ventricles and sulci are appropriate in size for patient's age. Mild periventricular and deep white matter hypodensities represent chronic microvascular ischemic changes. There is no intracranial hemorrhage. No mass effect or midline shift identified. A 6 x 8 mm calcific density along the falx may represent  a small calcified meningioma. The visualized paranasal sinuses and mastoid air cells are well aerated. The calvarium is intact. IMPRESSION: No acute intracranial hemorrhage. Mild age-related atrophy and chronic microvascular ischemic disease. If symptoms persist and there are no contraindications, MRI may provide better evaluation if clinically indicated Electronically Signed   By: Anner Crete M.D.   On: 08/19/2015 04:43   Ct Angio Chest Pe W/cm &/or Wo Cm  08/19/2015  CLINICAL DATA:  75 year old female with shortness of breath. Elevated D-dimer. Back pain. EXAM: CT ANGIOGRAPHY CHEST WITH CONTRAST TECHNIQUE: Multidetector CT imaging of the chest was performed using the standard protocol during bolus administration of intravenous contrast. Multiplanar CT image reconstructions and MIPs were obtained to  evaluate the vascular anatomy. CONTRAST:  137mL OMNIPAQUE IOHEXOL 350 MG/ML SOLN COMPARISON:  No priors. FINDINGS: Mediastinum/Lymph Nodes: Massive saddle pulmonary embolus extending into main, lobar, segmental and subsegmental sized branches throughout the lungs bilaterally. Main pulmonary artery is mildly dilated measuring 3 cm in diameter. Severe right heart dilatation. Right ventricular diameter up to 59 mm. Left ventricular diameter up to 32 mm. Severe right to left bowing of the inter- ventricular septum, and right to left bowing of the interatrial septum all indicate markedly elevated right heart pressures. Small amount of pericardial fluid and/or thickening, unlikely to be of any hemodynamic significance at this time. No pathologically enlarged mediastinal or hilar lymph nodes. Esophagus is unremarkable in appearance. No axillary lymphadenopathy. Lungs/Pleura: No acute consolidative airspace disease. No pleural effusions. No suspicious appearing pulmonary nodules or masses. Upper Abdomen: Reflux of contrast material into the IVC and hepatic veins. Otherwise, unremarkable. Musculoskeletal/Soft Tissues: There are no aggressive appearing lytic or blastic lesions noted in the visualized portions of the skeleton. Review of the MIP images confirms the above findings. IMPRESSION: 1. Positive for acute PE with CT evidence of right heart strain (RV/LV Ratio = 1.84) consistent with at least submassive (intermediate risk) PE. The presence of right heart strain has been associated with an increased risk of morbidity and mortality. Please activate Code PE by paging [phone]. 2. Trace amount of pericardial fluid and/or thickening, unlikely to be of any hemodynamic significance at this time. Critical Value/emergent results were called by telephone at the time of interpretation on 08/19/2015 at 6:39 pm to Dr. Wendee Beavers, who verbally acknowledged these results. Electronically Signed   By: Vinnie Langton M.D.   On: 08/19/2015  18:41   Nm Pulmonary Perf And Vent  08/19/2015  CLINICAL DATA:  Lower extremity edema. Chest pain and shortness of breath. EXAM: NUCLEAR MEDICINE VENTILATION - PERFUSION LUNG SCAN TECHNIQUE: Ventilation images were obtained in multiple projections using inhaled aerosol Tc-68m DTPA. Perfusion images were obtained in multiple projections after intravenous injection of Tc-80m MAA. RADIOPHARMACEUTICALS:  31.7 Technetium-57m DTPA aerosol inhalation and 4.1 Technetium-15m MAA IV COMPARISON:  08/19/2015 FINDINGS: Ventilation: Mild ventilation defect in the apical segment right upper lobe. Perfusion: Wedge-shaped large left upper lobe peripheral perfusion defect. Large apical segment right upper lobe matched perfusion defect. Chest radiograph normal in both regions. IMPRESSION: 1. Intermediate probability of pulmonary embolus by PIOPED 2 criteria (20 - 79% risk of pulmonary embolus), with 1 large mismatched perfusion defect in the left upper lobe, and a matched large right upper lobe perfusion and ventilation defect. Electronically Signed   By: Van Clines M.D.   On: 08/19/2015 12:32    Labs:  CBC:  Recent Labs  07/13/15 1129 08/19/15 0041 08/19/15 0407 08/20/15 0258  WBC 8.0 12.8* 13.1* 10.4  HGB 14.4  14.7 15.0 12.6  HCT 44.4 46.8* 47.8* 40.5  PLT 330 204 204 183    COAGS:  Recent Labs  08/19/15 0041  INR 1.26  APTT 28    BMP:  Recent Labs  07/13/15 1129 08/19/15 0041 08/19/15 0407 08/20/15 0258  NA 137 143 143 143  K 4.1 4.8 4.9 3.8  CL 101 113* 109 109  CO2 24 16* 16* 23  GLUCOSE 81 192* 137* 95  BUN 18 31* 30* 26*  CALCIUM 9.7 9.1 9.2 8.8*  CREATININE 0.85 1.52* 1.37* 1.16*  GFRNONAA 67 32* 37* 45*  GFRAA 78 38* 43* 52*    LIVER FUNCTION TESTS:  Recent Labs  03/30/15 1101 07/13/15 1129 08/19/15 0041  BILITOT 0.7 0.7 0.9  AST 16 19 200*  ALT 11 13 216*  ALKPHOS 76 78 102  PROT 7.3 7.1 7.4  ALBUMIN 4.0 4.1 4.0    TUMOR MARKERS: No results for  input(s): AFPTM, CEA, CA199, CHROMGRNA in the last 8760 hours.  Assessment and Plan:  SOB; weakness Findings: LLE DVT; saddle PE with Rt heart strain; Tricuspid valve clot Now on Heparin drip CCM feels best to not use TPA in this pt Now scheduled for retrievable IVC filter placement Risks and Benefits discussed with the patient including, but not limited to bleeding, infection, contrast induced renal failure, filter fracture or migration which can lead to emergency surgery or even death, strut penetration with damage or irritation to adjacent structures and caval thrombosis. All of the patient's questions were answered, patient is agreeable to proceed. Consent signed and in chart.   Thank you for this interesting consult.  I greatly enjoyed meeting Kimberly Watson and look forward to participating in their care.  A copy of this report was sent to the requesting provider on this date.  Electronically Signed: Monia Sabal A 08/20/2015, 7:39 AM   I spent a total of 20 Minutes    in face to face in clinical consultation, greater than 50% of which was counseling/coordinating care for retrievable IVC filter

## 2015-08-20 NOTE — Clinical Documentation Improvement (Signed)
Critical Care  (Query responses must be documented in the progress notes and discharge summary, Not On The [facility] BPA.)  Please clarify if the patient has had "Sepsis" this admission.  Clinical Information/Indicators: Sepsis: patient's lactic acid is elevated at 4.0. This is at least partially due to AKI,, but pt meets current dysuria for sepsis. Patient received one dose of Lasix at 3:30 possible CHF, will have to hold diuretics now  -start gentle IVF: NS 75 cc/h  -trend Lactic acid  -discussed with Dr. Ralene Bathe, she will consult to PCCM is documented by Dr. Blaine Hamper in the H&P   Please exercise your independent, professional judgment when responding. A specific answer is not anticipated or expected.   Thank You, Erling Conte  RN BSN CCDS [phone] [facility]

## 2015-08-20 NOTE — Sedation Documentation (Signed)
Patient is resting comfortably. 

## 2015-08-20 NOTE — Progress Notes (Signed)
PULMONARY / CRITICAL CARE MEDICINE   Name: Kimberly Watson MRN: [id number] DOB: 07/14/1941    VITAL SIGNS: Temp:  [97.5 F (36.4 C)-98.8 F (37.1 C)] 97.6 F (36.4 C) (01/20 0340) Pulse Rate:  [59-92] 92 (01/20 0700) Resp:  [12-33] 26 (01/20 0700) BP: (70-144)/(55-103) 124/81 mmHg (01/20 0700) SpO2:  [89 %-98 %] 96 % (01/20 0700) Weight:  [81.2 kg (179 lb 0.2 oz)] 81.2 kg (179 lb 0.2 oz) (01/20 0430) HEMODYNAMICS:   VENTILATOR SETTINGS:   INTAKE / OUTPUT: Intake/Output      01/19 0701 - 01/20 0700 01/20 0701 - 01/21 0700   I.V. (mL/kg) 884 (10.9)    IV Piggyback 50    Total Intake(mL/kg) 934 (11.5)    Urine (mL/kg/hr) 750 (0.4) 350 (5.6)   Total Output 750 350   Net +184 -350          PHYSICAL EXAMINATION: General: Awake, lying in bed  Neuro: AAOx4 HEENT: EOMI Cardiovascular:RRR, S1 and S2 appreciated Lungs: CTAB Abdomen: Soft, NT, ND Musculoskeletal: intact Skin: Lower ext + edema  LABS:  CBC  Recent Labs Lab 08/19/15 0041 08/19/15 0407 08/20/15 0258  WBC 12.8* 13.1* 10.4  HGB 14.7 15.0 12.6  HCT 46.8* 47.8* 40.5  PLT 204 204 183   Coag's  Recent Labs Lab 08/19/15 0041  APTT 28  INR 1.26   BMET  Recent Labs Lab 08/19/15 0041 08/19/15 0407 08/20/15 0258  NA 143 143 143  K 4.8 4.9 3.8  CL 113* 109 109  CO2 16* 16* 23  BUN 31* 30* 26*  CREATININE 1.52* 1.37* 1.16*  GLUCOSE 192* 137* 95   Electrolytes  Recent Labs Lab 08/19/15 0041 08/19/15 0407 08/20/15 0258  CALCIUM 9.1 9.2 8.8*  MG  --   --  1.8  PHOS  --   --  3.9   Sepsis Markers  Recent Labs Lab 08/19/15 0407 08/19/15 0707  LATICACIDVEN 4.0* 2.5*  PROCALCITON <0.10  --    ABG No results for input(s): PHART, PCO2ART, PO2ART in the last 168 hours. Liver Enzymes  Recent Labs Lab 08/19/15 0041  AST 200*  ALT 216*  ALKPHOS 102  BILITOT 0.9  ALBUMIN 4.0   Cardiac Enzymes  Recent Labs Lab 08/19/15 0407 08/19/15 0900 08/19/15 1625  TROPONINI 0.25*  0.23* 0.20*   Glucose  Recent Labs Lab 08/18/15 2331 08/19/15 0811 08/19/15 2120  GLUCAP 187* 84 119*    Imaging Dg Chest 2 View  08/19/2015  CLINICAL DATA:  Acute onset of worsening shortness of breath on exertion. Sinus headache. Initial encounter. EXAM: CHEST  2 VIEW COMPARISON:  None. FINDINGS: The lungs are well-aerated and clear. There is no evidence of focal opacification, pleural effusion or pneumothorax. The heart is borderline enlarged. No acute osseous abnormalities are seen. Clips are noted within the right upper quadrant, reflecting prior cholecystectomy. IMPRESSION: Borderline cardiomegaly.  Lungs remain grossly clear. Electronically Signed   By: Garald Balding M.D.   On: 08/19/2015 01:03   Ct Head Wo Contrast  08/19/2015  CLINICAL DATA:  75 year old female with dizziness and vertigo EXAM: CT HEAD WITHOUT CONTRAST TECHNIQUE: Contiguous axial images were obtained from the base of the skull through the vertex without intravenous contrast. COMPARISON:  None. FINDINGS: The ventricles and sulci are appropriate in size for patient's age. Mild periventricular and deep white matter hypodensities represent chronic microvascular ischemic changes. There is no intracranial hemorrhage. No mass effect or midline shift identified. A 6 x 8 mm calcific density along the  falx may represent a small calcified meningioma. The visualized paranasal sinuses and mastoid air cells are well aerated. The calvarium is intact. IMPRESSION: No acute intracranial hemorrhage. Mild age-related atrophy and chronic microvascular ischemic disease. If symptoms persist and there are no contraindications, MRI may provide better evaluation if clinically indicated Electronically Signed   By: Anner Crete M.D.   On: 08/19/2015 04:43   Ct Angio Chest Pe W/cm &/or Wo Cm  08/19/2015  CLINICAL DATA:  74 year old female with shortness of breath. Elevated D-dimer. Back pain. EXAM: CT ANGIOGRAPHY CHEST WITH CONTRAST TECHNIQUE:  Multidetector CT imaging of the chest was performed using the standard protocol during bolus administration of intravenous contrast. Multiplanar CT image reconstructions and MIPs were obtained to evaluate the vascular anatomy. CONTRAST:  145mL OMNIPAQUE IOHEXOL 350 MG/ML SOLN COMPARISON:  No priors. FINDINGS: Mediastinum/Lymph Nodes: Massive saddle pulmonary embolus extending into main, lobar, segmental and subsegmental sized branches throughout the lungs bilaterally. Main pulmonary artery is mildly dilated measuring 3 cm in diameter. Severe right heart dilatation. Right ventricular diameter up to 59 mm. Left ventricular diameter up to 32 mm. Severe right to left bowing of the inter- ventricular septum, and right to left bowing of the interatrial septum all indicate markedly elevated right heart pressures. Small amount of pericardial fluid and/or thickening, unlikely to be of any hemodynamic significance at this time. No pathologically enlarged mediastinal or hilar lymph nodes. Esophagus is unremarkable in appearance. No axillary lymphadenopathy. Lungs/Pleura: No acute consolidative airspace disease. No pleural effusions. No suspicious appearing pulmonary nodules or masses. Upper Abdomen: Reflux of contrast material into the IVC and hepatic veins. Otherwise, unremarkable. Musculoskeletal/Soft Tissues: There are no aggressive appearing lytic or blastic lesions noted in the visualized portions of the skeleton. Review of the MIP images confirms the above findings. IMPRESSION: 1. Positive for acute PE with CT evidence of right heart strain (RV/LV Ratio = 1.84) consistent with at least submassive (intermediate risk) PE. The presence of right heart strain has been associated with an increased risk of morbidity and mortality. Please activate Code PE by paging [phone]. 2. Trace amount of pericardial fluid and/or thickening, unlikely to be of any hemodynamic significance at this time. Critical Value/emergent results  were called by telephone at the time of interpretation on 08/19/2015 at 6:39 pm to Dr. Wendee Beavers, who verbally acknowledged these results. Electronically Signed   By: Vinnie Langton M.D.   On: 08/19/2015 18:41   Nm Pulmonary Perf And Vent  08/19/2015  CLINICAL DATA:  Lower extremity edema. Chest pain and shortness of breath. EXAM: NUCLEAR MEDICINE VENTILATION - PERFUSION LUNG SCAN TECHNIQUE: Ventilation images were obtained in multiple projections using inhaled aerosol Tc-43m DTPA. Perfusion images were obtained in multiple projections after intravenous injection of Tc-72m MAA. RADIOPHARMACEUTICALS:  31.7 Technetium-44m DTPA aerosol inhalation and 4.1 Technetium-30m MAA IV COMPARISON:  08/19/2015 FINDINGS: Ventilation: Mild ventilation defect in the apical segment right upper lobe. Perfusion: Wedge-shaped large left upper lobe peripheral perfusion defect. Large apical segment right upper lobe matched perfusion defect. Chest radiograph normal in both regions. IMPRESSION: 1. Intermediate probability of pulmonary embolus by PIOPED 2 criteria (20 - 79% risk of pulmonary embolus), with 1 large mismatched perfusion defect in the left upper lobe, and a matched large right upper lobe perfusion and ventilation defect. Electronically Signed   By: Van Clines M.D.   On: 08/19/2015 12:32   STUDIES: Echo 08/19/15 Study Conclusions  - Left ventricle: The cavity size was normal. Wall thickness was normal. Systolic function was normal.  The estimated ejection fraction was in the range of 50% to 55%. Wall motion was normal; there were no regional wall motion abnormalities. Doppler parameters are consistent with abnormal left ventricular relaxation (grade 1 diastolic dysfunction). - Ventricular septum: The contour showed diastolic flattening and systolic flattening. - Mitral valve: There was mild regurgitation. - Right ventricle: The cavity size was mildly dilated. Systolic function was severely  reduced. - Right atrium: The atrium was mildly dilated. - Tricuspid valve: There was moderate regurgitation. - Pulmonary arteries: Systolic pressure was moderately to severely increased. PA peak pressure: 54 mm Hg (S). - Pericardium, extracardiac: A trivial pericardial effusion was identified.  Impressions:  - Low normal LV systolic function; grade 1 diastolic dysfunction; D shaped septum; mld MR; mild RAE/RVE; severely reduced RV function; oscillating density on TV concerning for SBE; moderate TR; moderate to severe pulmonary hypertension.  Lower extremity doppler 08/19/15 Summary:  - Findings consistent with acute deep vein thrombosis involving the  left femoral vein, left popliteal vein, left posterial tibial  vein, and left peroneal vein. - No evidence of deep vein thrombosis involving the left lower  extremity. - No evidence of Baker&'s cyst on the right or left.Summary:  - Findings consistent with acute deep vein thrombosis involving the  left femoral vein, left popliteal vein, left posterial tibial  vein, and left peroneal vein. - No evidence of deep vein thrombosis involving the left lower  extremity. - No evidence of Baker&'s cyst on the right or left.  EKG 08/19/15   Significant events: 1/19>>CTA showing acute submassive (intemediate risk) PE.with R heart strain (RV/LV ratio 1.84). Echo showing possible mass on tricuspid valve. Doppler showing LLE DVT.  1/20>> VSS. Patient denies having any SOB, CP, or dizziness. Plan for IVC filter placement today.   ASSESSMENT / PLAN:  PULMONARY A:Pulmonary embolism - currently satting 95% on 5L O2 via [place] P:   Continue Heparin gtt Plan for IVC filter today  Continue O2 supplementation to keep O2 saturation >92% Albuterol neb    CARDIOVASCULAR A:  DVT Possible mass on tricuspid valve  Elevated troponin - likely due to demand ischemia  Elevated BNP - likely related to R heart strain from PE HTN P:   Continue Heparin  Continue Metoprolol  Sl nitroglycerin prn Cycle troponins (0.25>0.23>0.20) Hold off diuresis  Monitor vitals  Cardiology recommending repeat echo before discharge   RENAL A:  AKI - likely in the setting of recent diarrhea  P:   Monitor BNP Avoid ACEI and NSAIDs  GASTROINTESTINAL A:  No acute issues Hx of GERD P:   NPO for now  HEMATOLOGIC A:  DVT PE P:  -plan same as above   INFECTIOUS A:  UTI  Sepsis  -lactic acid 4.0>>2.5 P:   On vanc and ceftriaxone now.   Trend lactic acid    ENDOCRINE A:   No acute issues P:   Monitor CBG  NEUROLOGIC A:  No acute issues P:     TODAY'S SUMMARY:   I have personally obtained a history, examined the patient, evaluated laboratory and imaging results, formulated the assessment and plan and placed orders. CRITICAL CARE: The patient is critically ill with multiple organ systems failure and requires high complexity decision making for assessment and support, frequent evaluation and titration of therapies, application of advanced monitoring technologies and extensive interpretation of multiple databases. Critical Care Time devoted to patient care services described in this note is minutes.    Pulmonary and [place] Pager: [phone]  08/20/2015, 7:46 AM

## 2015-08-20 NOTE — Progress Notes (Signed)
ANTICOAGULATION CONSULT NOTE - Follow Up Consult  Pharmacy Consult for Heparin  Indication: DVT/PE/clot on tricuspid valve  Allergies  Allergen Reactions  . Fosamax [Alendronate Sodium]     esophagitis   Patient Measurements: Height: 5\' 2"  (157.5 cm) Weight: 179 lb 0.2 oz (81.2 kg) IBW/kg (Calculated) : 50.1 Vital Signs: Temp: 97.5 F (36.4 C) (01/19 2331) Temp Source: Oral (01/19 2331) BP: 85/62 mmHg (01/20 0000) Pulse Rate: 60 (01/20 0000)  Labs:  Recent Labs  08/19/15 0041 08/19/15 0407 08/19/15 0900 08/19/15 1303 08/19/15 1625 08/19/15 2250  HGB 14.7 15.0  --   --   --   --   HCT 46.8* 47.8*  --   --   --   --   PLT 204 204  --   --   --   --   APTT 28  --   --   --   --   --   LABPROT 15.9*  --   --   --   --   --   INR 1.26  --   --   --   --   --   HEPARINUNFRC  --   --   --  0.47  --  0.55  CREATININE 1.52* 1.37*  --   --   --   --   TROPONINI 0.20* 0.25* 0.23*  --  0.20*  --     Estimated Creatinine Clearance: 35 mL/min (by C-G formula based on Cr of 1.37).  Assessment: 75 y/o transfer from [facility] on heparin with DVT, saddle PE, and clot on tricuspid valve. Getting IVC filter in AM.  CBC good, Scr 1.37, other labs reviewed.   Goal of Therapy:  Heparin level 0.3-0.7 units/ml Monitor platelets by anticoagulation protocol: Yes   Plan:  -Continue heparin at 1100 units/hr -Daily CBC/HL -Monitor for bleeding -IVC filter today  Narda Bonds 08/20/2015,12:26 AM

## 2015-08-20 NOTE — Procedures (Signed)
Interventional Radiology Procedure Note  Procedure:  IVC filter placement  Complications:  None  Estimated Blood Loss: < 10 mL  IVC venogram via right IJ vein with CO2 demonstrates widely patent IVC. [place] retrievable IVC filter placed in infrarenal IVC.    Venetia Night. Kathlene Cote, M.D Pager:  [phone]

## 2015-08-20 NOTE — Progress Notes (Signed)
Spoke with IR nurse, patient procedure next in line, 45-90 mins. Patient advised.

## 2015-08-20 NOTE — Progress Notes (Signed)
IR called, plan to transport patient for procedure sometime before noon. Pt advised. 10am medications held until after procedure so that patient remains NPO as ordered.

## 2015-08-21 DIAGNOSIS — N179 Acute kidney failure, unspecified: Secondary | ICD-10-CM

## 2015-08-21 LAB — BASIC METABOLIC PANEL
Anion gap: 9 (ref 5–15)
BUN: 21 mg/dL — ABNORMAL HIGH (ref 6–20)
CO2: 21 mmol/L — ABNORMAL LOW (ref 22–32)
Calcium: 8.4 mg/dL — ABNORMAL LOW (ref 8.9–10.3)
Chloride: 111 mmol/L (ref 101–111)
Creatinine, Ser: 1.02 mg/dL — ABNORMAL HIGH (ref 0.44–1.00)
GFR calc Af Amer: >60 mL/min (ref >60)
GFR calc non Af Amer: 52 mL/min — ABNORMAL LOW (ref >60)
Glucose, Bld: 96 mg/dL (ref 65–99)
Potassium: 3.7 mmol/L (ref 3.5–5.1)
Sodium: 141 mmol/L (ref 135–145)

## 2015-08-21 LAB — CBC
HCT: 38.3 % (ref 36.0–46.0)
Hemoglobin: 12.3 g/dL (ref 12.0–15.0)
MCH: 27.2 pg (ref 26.0–34.0)
MCHC: 32.1 g/dL (ref 30.0–36.0)
MCV: 84.7 fL (ref 78.0–100.0)
Platelets: 180 10*3/uL (ref 150–400)
RBC: 4.52 MIL/uL (ref 3.87–5.11)
RDW: 15.4 % (ref 11.5–15.5)
WBC: 9.1 10*3/uL (ref 4.0–10.5)

## 2015-08-21 LAB — HEPARIN LEVEL (UNFRACTIONATED): Heparin Unfractionated: 0.47 IU/mL (ref 0.30–0.70)

## 2015-08-21 LAB — LACTIC ACID, PLASMA: Lactic Acid, Venous: 1.2 mmol/L (ref 0.5–2.0)

## 2015-08-21 LAB — PROCALCITONIN: Procalcitonin: <0.1 ng/mL

## 2015-08-21 LAB — MAGNESIUM: Magnesium: 1.9 mg/dL (ref 1.7–2.4)

## 2015-08-21 LAB — GLUCOSE, CAPILLARY: Glucose-Capillary: 90 mg/dL (ref 65–99)

## 2015-08-21 LAB — PHOSPHORUS: Phosphorus: 3.8 mg/dL (ref 2.5–4.6)

## 2015-08-21 LAB — TROPONIN I: Troponin I: 0.09 ng/mL — ABNORMAL HIGH (ref <0.031)

## 2015-08-21 MED ORDER — MAGNESIUM SULFATE IN D5W 10-5 MG/ML-% IV SOLN
1.0000 g | Freq: Once | INTRAVENOUS | Status: AC
  Administered 2015-08-21: 1 g via INTRAVENOUS
  Filled 2015-08-21: qty 100

## 2015-08-21 MED ORDER — VANCOMYCIN HCL IN DEXTROSE 750-5 MG/150ML-% IV SOLN
750.0000 mg | Freq: Two times a day (BID) | INTRAVENOUS | Status: DC
Start: 2015-08-21 — End: 2015-08-21
  Filled 2015-08-21 (×2): qty 150

## 2015-08-21 NOTE — Progress Notes (Signed)
ANTIBIOTIC CONSULT NOTE   Pharmacy Consult for vancomycin Indication: endocarditis  Allergies  Allergen Reactions  . Evista [Raloxifene] Other (See Comments)    Blood clots - DVT and Pulmonary Embolism   . Fosamax [Alendronate Sodium]     esophagitis    Patient Measurements: Height: 5\' 2"  (157.5 cm) Weight: 179 lb 7.3 oz (81.4 kg) IBW/kg (Calculated) : 50.1   Vital Signs: Temp: 97.8 F (36.6 C) (01/21 0827) Temp Source: Oral (01/21 0827) BP: 117/73 mmHg (01/21 0827) Pulse Rate: 78 (01/21 0827) Intake/Output from previous day: 01/20 0701 - 01/21 0700 In: 2281 [I.V.:1981; IV Piggyback:300] Out: 800 [Urine:800] Intake/Output from this shift:    Labs:  Recent Labs  08/19/15 0130 08/19/15 0407 08/20/15 0258 08/21/15 0310  WBC  --  13.1* 10.4 9.1  HGB  --  15.0 12.6 12.3  PLT  --  204 183 180  LABCREA 338.48  --   --   --   CREATININE  --  1.37* 1.16* 1.02*   Estimated Creatinine Clearance: 47.1 mL/min (by C-G formula based on Cr of 1.02). No results for input(s): VANCOTROUGH, VANCOPEAK, VANCORANDOM, GENTTROUGH, GENTPEAK, GENTRANDOM, TOBRATROUGH, TOBRAPEAK, TOBRARND, AMIKACINPEAK, AMIKACINTROU, AMIKACIN in the last 72 hours.   Microbiology: Recent Results (from the past 720 hour(s))  Urine culture     Status: None   Collection Time: 08/19/15  1:28 AM  Result Value Ref Range Status   Specimen Description URINE, CLEAN CATCH  Final   Special Requests NONE  Final   Culture   Final    MULTIPLE SPECIES PRESENT, SUGGEST RECOLLECTION Performed at [facility]    Report Status 08/20/2015 FINAL  Final  Culture, blood (Routine X 2) w Reflex to ID Panel     Status: None (Preliminary result)   Collection Time: 08/19/15  4:00 AM  Result Value Ref Range Status   Specimen Description BLOOD LEFT HAND  Final   Special Requests IN PEDIATRIC BOTTLE 2CC  Final   Culture   Final    NO GROWTH 1 DAY Performed at [facility]    Report Status PENDING  Incomplete   Culture, blood (Routine X 2) w Reflex to ID Panel     Status: None (Preliminary result)   Collection Time: 08/19/15  4:18 AM  Result Value Ref Range Status   Specimen Description BLOOD RIGHT HAND  Final   Special Requests BOTTLES DRAWN AEROBIC ONLY 5CC  Final   Culture   Final    NO GROWTH 1 DAY Performed at [facility]    Report Status PENDING  Incomplete  Culture, blood (routine x 2)     Status: None (Preliminary result)   Collection Time: 08/19/15  4:29 PM  Result Value Ref Range Status   Specimen Description RIGHT ANTECUBITAL  Final   Special Requests BOTTLES DRAWN AEROBIC AND ANAEROBIC 5CC  Final   Culture   Final    NO GROWTH < 24 HOURS Performed at [facility]    Report Status PENDING  Incomplete  Culture, blood (routine x 2)     Status: None (Preliminary result)   Collection Time: 08/19/15  4:45 PM  Result Value Ref Range Status   Specimen Description BLOOD LEFT HAND  Final   Special Requests BOTTLES DRAWN AEROBIC AND ANAEROBIC 5CC  Final   Culture   Final    NO GROWTH < 24 HOURS Performed at [facility]    Report Status PENDING  Incomplete  MRSA PCR Screening  Status: None   Collection Time: 08/19/15  8:33 PM  Result Value Ref Range Status   MRSA by PCR NEGATIVE NEGATIVE Final    Comment:        The GeneXpert MRSA Assay (FDA approved for NASAL specimens only), is one component of a comprehensive MRSA colonization surveillance program. It is not intended to diagnose MRSA infection nor to guide or monitor treatment for MRSA infections.    Assessment: 75 yr female continues on ceftriaxone and vancomycin for possible endocarditis. Pt is afebrile and WBC is WNL. Her Scr has improved since admission.   1/19 >> Rocephin >> 1/19 >> Vancomycin >>   1/19 bloodx2>>ngtd  1/19 urine: >>ngtd  1/19 sputum: >>  Goal of Therapy:  Vancomycin trough level 15-20 mcg/ml  Plan:  - Change vancomycin to 750mg  IV Q12H - F/u renal fxn, C&S,  clinical status and trough at [facility], PharmD, BCPS Pager # [phone] 08/21/2015 8:57 AM

## 2015-08-21 NOTE — Progress Notes (Signed)
ANTICOAGULATION CONSULT NOTE - Follow Up Consult  Pharmacy Consult for Heparin  Indication: DVT/PE/clot on tricuspid valve  Allergies  Allergen Reactions  . Evista [Raloxifene] Other (See Comments)    Blood clots - DVT and Pulmonary Embolism   . Fosamax [Alendronate Sodium]     esophagitis   Patient Measurements: Height: 5\' 2"  (157.5 cm) Weight: 179 lb 7.3 oz (81.4 kg) IBW/kg (Calculated) : 50.1 Vital Signs: Temp: 97.8 F (36.6 C) (01/21 0827) Temp Source: Oral (01/21 0827) BP: 117/73 mmHg (01/21 0827) Pulse Rate: 78 (01/21 0827)  Labs:  Recent Labs  08/19/15 0041 08/19/15 0407 08/19/15 0900  08/19/15 1625 08/19/15 2250 08/20/15 0258 08/21/15 0310  HGB 14.7 15.0  --   --   --   --  12.6 12.3  HCT 46.8* 47.8*  --   --   --   --  40.5 38.3  PLT 204 204  --   --   --   --  183 180  APTT 28  --   --   --   --   --   --   --   LABPROT 15.9*  --   --   --   --   --   --   --   INR 1.26  --   --   --   --   --   --   --   HEPARINUNFRC  --   --   --   < >  --  0.55 0.52 0.47  CREATININE 1.52* 1.37*  --   --   --   --  1.16* 1.02*  TROPONINI 0.20* 0.25* 0.23*  --  0.20*  --   --  0.09*  < > = values in this interval not displayed.  Estimated Creatinine Clearance: 47.1 mL/min (by C-G formula based on Cr of 1.02).  Assessment: 75 y/o transfered from [facility] on heparin with DVT, saddle PE, and clot on tricuspid valve. S/p IVC filter yesterday and she continues on IV heparin. Heparin level remains therapeutic. CBC is stable and no bleeding noted.   Goal of Therapy:  Heparin level 0.3-0.7 units/ml Monitor platelets by anticoagulation protocol: Yes   Plan:  -Continue heparin at 1100 units/hr -Daily CBC/HL -Monitor for bleeding - F/u plans for oral anticoagulation  Yousef Huge, Rande Lawman 08/21/2015,8:54 AM

## 2015-08-21 NOTE — Progress Notes (Addendum)
PULMONARY / CRITICAL CARE MEDICINE   Name: Kimberly Watson MRN: [id number] DOB: [birth year]    STUDIES: Echo 08/19/15 Study Conclusions  - Left ventricle: The cavity size was normal. Wall thickness was normal. Systolic function was normal. The estimated ejection fraction was in the range of 50% to 55%. Wall motion was normal; there were no regional wall motion abnormalities. Doppler parameters are consistent with abnormal left ventricular relaxation (grade 1 diastolic dysfunction). - Ventricular septum: The contour showed diastolic flattening and systolic flattening. - Mitral valve: There was mild regurgitation. - Right ventricle: The cavity size was mildly dilated. Systolic function was severely reduced. - Right atrium: The atrium was mildly dilated. - Tricuspid valve: There was moderate regurgitation. - Pulmonary arteries: Systolic pressure was moderately to severely increased. PA peak pressure: 54 mm Hg (S). - Pericardium, extracardiac: A trivial pericardial effusion was identified.  Impressions:  - Low normal LV systolic function; grade 1 diastolic dysfunction; D shaped septum; mld MR; mild RAE/RVE; severely reduced RV function; oscillating density on TV concerning for SBE; moderate TR; moderate to severe pulmonary hypertension.  Lower extremity doppler 08/19/15 Summary:  - Findings consistent with acute deep vein thrombosis involving the  left femoral vein, left popliteal vein, left posterial tibial  vein, and left peroneal vein. - No evidence of deep vein thrombosis involving the left lower  extremity. - No evidence of Baker&'s cyst on the right or left.Summary:  - Findings consistent with acute deep vein thrombosis involving the  left femoral vein, left popliteal vein, left posterial tibial  vein, and left peroneal vein. - No evidence of deep vein thrombosis involving the left lower  extremity. - No evidence of Baker&'s cyst on the  right or left.  EKG 08/19/15   Significant events: 1/19>>CTA showing acute submassive (intemediate risk) PE.with R heart strain (RV/LV ratio 1.84). Echo showing possible mass on tricuspid valve. Doppler showing LLE DVT.  1/20>> VSS. Patient denies having any SOB, CP, or dizziness. Plan for IVC filter placement today.    SUBJECTIVE/OVERNIGHT/INTERVAL HX 08/21/15 - s/p IVC filter yesterday. Denies complaints other than re-activation of IBS after eating beans and chicken in hospital. Pulse ox 87% RA, HR 81 and normal BP and mentation. On IV heparin   VITAL SIGNS: Temp:  [97.5 F (36.4 C)-98.6 F (37 C)] 97.8 F (36.6 C) (01/21 0827) Pulse Rate:  [60-88] 72 (01/21 0900) Resp:  [13-28] 21 (01/21 0900) BP: (86-147)/(60-103) 86/63 mmHg (01/21 0900) SpO2:  [87 %-98 %] 92 % (01/21 0900) Weight:  [81.4 kg (179 lb 7.3 oz)] 81.4 kg (179 lb 7.3 oz) (01/21 0400) HEMODYNAMICS:   VENTILATOR SETTINGS:   INTAKE / OUTPUT: Intake/Output      01/20 0701 - 01/21 0700 01/21 0701 - 01/22 0700   P.O.  120   I.V. (mL/kg) 2067 (25.4) 172 (2.1)   IV Piggyback 300    Total Intake(mL/kg) 2367 (29.1) 292 (3.6)   Urine (mL/kg/hr) 800 (0.4)    Stool 0 (0)    Total Output 800     Net +1567 +292        Stool Occurrence 2 x 1 x     PHYSICAL EXAMINATION: General: Awake, siitting in bed on pan Neuro: AAOx4 HEENT: EOMI Cardiovascular:RRR, S1 and S2 appreciated Lungs: CTAB Abdomen: Soft, NT, ND Musculoskeletal: intact Skin: Lower ext + edema - LLE > RLE and LLE is warm  LABS:  CBC  Recent Labs Lab 08/19/15 0407 08/20/15 0258 08/21/15 0310  WBC 13.1* 10.4 9.1  HGB 15.0 12.6 12.3  HCT 47.8* 40.5 38.3  PLT 204 183 180   Coag's  Recent Labs Lab 08/19/15 0041  APTT 28  INR 1.26   BMET  Recent Labs Lab 08/19/15 0407 08/20/15 0258 08/21/15 0310  NA 143 143 141  K 4.9 3.8 3.7  CL 109 109 111  CO2 16* 23 21*  BUN 30* 26* 21*  CREATININE 1.37* 1.16* 1.02*  GLUCOSE 137* 95  96   Electrolytes  Recent Labs Lab 08/19/15 0407 08/20/15 0258 08/21/15 0310  CALCIUM 9.2 8.8* 8.4*  MG  --  1.8 1.9  PHOS  --  3.9 3.8   Sepsis Markers  Recent Labs Lab 08/19/15 0407 08/19/15 0707 08/21/15 0310  LATICACIDVEN 4.0* 2.5* 1.2  PROCALCITON <0.10  --   --    ABG No results for input(s): PHART, PCO2ART, PO2ART in the last 168 hours. Liver Enzymes  Recent Labs Lab 08/19/15 0041  AST 200*  ALT 216*  ALKPHOS 102  BILITOT 0.9  ALBUMIN 4.0   Cardiac Enzymes  Recent Labs Lab 08/19/15 0900 08/19/15 1625 08/21/15 0310  TROPONINI 0.23* 0.20* 0.09*   Glucose  Recent Labs Lab 08/18/15 2331 08/19/15 0811 08/19/15 2120 08/20/15 0742 08/20/15 1137 08/21/15 0825  GLUCAP 187* 84 119* 82 84 90    Imaging Ct Angio Chest Pe W/cm &/or Wo Cm  08/19/2015  CLINICAL DATA:  75 year old female with shortness of breath. Elevated D-dimer. Back pain. EXAM: CT ANGIOGRAPHY CHEST WITH CONTRAST TECHNIQUE: Multidetector CT imaging of the chest was performed using the standard protocol during bolus administration of intravenous contrast. Multiplanar CT image reconstructions and MIPs were obtained to evaluate the vascular anatomy. CONTRAST:  155mL OMNIPAQUE IOHEXOL 350 MG/ML SOLN COMPARISON:  No priors. FINDINGS: Mediastinum/Lymph Nodes: Massive saddle pulmonary embolus extending into main, lobar, segmental and subsegmental sized branches throughout the lungs bilaterally. Main pulmonary artery is mildly dilated measuring 3 cm in diameter. Severe right heart dilatation. Right ventricular diameter up to 59 mm. Left ventricular diameter up to 32 mm. Severe right to left bowing of the inter- ventricular septum, and right to left bowing of the interatrial septum all indicate markedly elevated right heart pressures. Small amount of pericardial fluid and/or thickening, unlikely to be of any hemodynamic significance at this time. No pathologically enlarged mediastinal or hilar lymph nodes.  Esophagus is unremarkable in appearance. No axillary lymphadenopathy. Lungs/Pleura: No acute consolidative airspace disease. No pleural effusions. No suspicious appearing pulmonary nodules or masses. Upper Abdomen: Reflux of contrast material into the IVC and hepatic veins. Otherwise, unremarkable. Musculoskeletal/Soft Tissues: There are no aggressive appearing lytic or blastic lesions noted in the visualized portions of the skeleton. Review of the MIP images confirms the above findings. IMPRESSION: 1. Positive for acute PE with CT evidence of right heart strain (RV/LV Ratio = 1.84) consistent with at least submassive (intermediate risk) PE. The presence of right heart strain has been associated with an increased risk of morbidity and mortality. Please activate Code PE by paging [phone]. 2. Trace amount of pericardial fluid and/or thickening, unlikely to be of any hemodynamic significance at this time. Critical Value/emergent results were called by telephone at the time of interpretation on 08/19/2015 at 6:39 pm to Dr. Wendee Beavers, who verbally acknowledged these results. Electronically Signed   By: Vinnie Langton M.D.   On: 08/19/2015 18:41   Ir Ivc Filter Plmt / S&i /img Guid/mod Sed  08/20/2015  CLINICAL DATA:  Acute saddle pulmonary embolism and left lower extremity DVT. The  patient requires placement of an IVC filter. EXAM: 1. ULTRASOUND GUIDANCE FOR VASCULAR ACCESS OF THE RIGHT INTERNAL JUGULAR VEIN. 2. IVC VENOGRAM. 3. PERCUTANEOUS IVC FILTER PLACEMENT. ANESTHESIA/SEDATION: 1.0 mg IV Versed; 50 mcg IV Fentanyl. Total Moderate Sedation Time 20 minutes. The patient's level of consciousness and physiological status was monitored continuously during the procedure by Radiology nursing. CONTRAST:  CO2 gas FLUOROSCOPY TIME:  3 minutes and 36 seconds. PROCEDURE: The procedure, risks, benefits, and alternatives were explained to the patient. Questions regarding the procedure were encouraged and answered. The patient  understands and consents to the procedure. A time-out was performed prior to the procedure. The right neck was prepped with chlorhexidine in a sterile fashion, and a sterile drape was applied covering the operative field. A sterile gown and sterile gloves were used for the procedure. Local anesthesia was provided with 1% Lidocaine. Ultrasound was used to confirm patency of the right internal jugular vein. Under direct ultrasound guidance, a 21 gauge needle was advanced into the right internal jugular vein with ultrasound image documentation performed. After securing access with a micropuncture dilator, a guidewire was advanced into the inferior vena cava. A deployment sheath was advanced over the guidewire. This was utilized to perform IVC venography. The deployment sheath was further positioned in an appropriate location for filter deployment. A Bard Denali IVC filter was then advanced in the sheath. This was then fully deployed in the infrarenal IVC. Final filter position was confirmed with a fluoroscopic spot image. After the procedure the sheath was removed and hemostasis obtained with manual compression. COMPLICATIONS: None. FINDINGS: IVC venography demonstrates a normal caliber IVC with no evidence of thrombus. Renal veins are identified bilaterally. The IVC filter was successfully positioned below the level of the renal veins and is appropriately oriented. This IVC filter has both permanent and retrievable indications. IMPRESSION: Placement of percutaneous IVC filter in infrarenal IVC. IVC venogram shows no evidence of IVC thrombus and normal caliber of the inferior vena cava. This filter does have both permanent and retrievable indications. Electronically Signed   By: Aletta Edouard M.D.   On: 08/20/2015 17:42   Nm Pulmonary Perf And Vent  08/19/2015  CLINICAL DATA:  Lower extremity edema. Chest pain and shortness of breath. EXAM: NUCLEAR MEDICINE VENTILATION - PERFUSION LUNG SCAN TECHNIQUE: Ventilation  images were obtained in multiple projections using inhaled aerosol Tc-43m DTPA. Perfusion images were obtained in multiple projections after intravenous injection of Tc-54m MAA. RADIOPHARMACEUTICALS:  31.7 Technetium-78m DTPA aerosol inhalation and 4.1 Technetium-104m MAA IV COMPARISON:  08/19/2015 FINDINGS: Ventilation: Mild ventilation defect in the apical segment right upper lobe. Perfusion: Wedge-shaped large left upper lobe peripheral perfusion defect. Large apical segment right upper lobe matched perfusion defect. Chest radiograph normal in both regions. IMPRESSION: 1. Intermediate probability of pulmonary embolus by PIOPED 2 criteria (20 - 79% risk of pulmonary embolus), with 1 large mismatched perfusion defect in the left upper lobe, and a matched large right upper lobe perfusion and ventilation defect. Electronically Signed   By: Van Clines M.D.   On: 08/19/2015 12:32     ASSESSMENT / PLAN:  PULMONARY A:Pulmonary embolism with RV Strain and clot in transit in echo admit 08/18/2015 + Extensie LLE DVT  -due to  Sedentary life style + EVISTA +/- overeight   - severity - PESI 3 (hypxoemia neding 5L [place], [place] and age 75) but with high trop an lacate at admit 08/18/2015 with PASP 53   - Rx with IV heparin since 08/19/15 and IVC filter temp  08/20/15  08/21/15 - improved. 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 Mag < 2gm%  P:   Mag sulfate 1gm Monitor BNP Avoid ACEI and NSAIDs  GASTROINTESTINAL A:  No  acute issues Hx of GERD and IBS P:   Regular diet  HEMATOLOGIC A:  DVT PE P:  -plan same as above   INFECTIOUS  Recent Labs Lab 08/19/15 0407  PROCALCITON <0.10    A:   ? UTI  P:   dC vanc Continue ceftriaxone but dc 08/22/15 if PCT negative./culture negative  d    ENDOCRINE A:   No acute issues P:   Monitor CBG  NEUROLOGIC A:  No acute issues P:    Global Move to sDU. If worsens, do systemic TPA. CCM off and TRH primary from 08/22/15 - d/w [place]  Dr. Brand Males, M.D., [facility].C.P Pulmonary and Critical Care Medicine Staff Physician [facility] Pager: [phone], If no answer or between  15:00h - 7:00h: call [phone]  08/21/2015 10:36 AM

## 2015-08-21 NOTE — Progress Notes (Addendum)
08/21/2015 Patient transfer from 91m to 2 central at 1320. She is alert, oriented and normally ambulatory. Patient is positive for a DVT in left leg and SCD on right leg. Patient have bruises on bilateral arms, in the crack of buttocks excoriation, moisture between bilateral groin. Patient have a IVC filter that was placed on 08/20/2015 the area is clean and dry. There is no bleeding to the area. She is also on a heparin drip. [facility] RN.

## 2015-08-21 NOTE — Progress Notes (Signed)
PT Cancellation Note  Patient Details Name: [name] MRN: [id number] DOB: 1941-07-29   Cancelled Treatment:    Reason Eval/Treat Not Completed: Medical issues which prohibited therapy.    Per RN, defer today.  Has PE and involvement also in the heart, new IVC filter.  Can't maintain sats with movement.  Will see pt as soon as appropriate. 08/21/2015  Donnella Sham, [place] [phone]  (pager)   Moshe Wenger, Tessie Fass 08/21/2015, 9:44 AM

## 2015-08-22 LAB — HEPARIN LEVEL (UNFRACTIONATED)
Heparin Unfractionated: 0.27 IU/mL — ABNORMAL LOW (ref 0.30–0.70)
Heparin Unfractionated: 0.37 IU/mL (ref 0.30–0.70)
Heparin Unfractionated: 0.4 IU/mL (ref 0.30–0.70)

## 2015-08-22 LAB — CBC
HCT: 37.5 % (ref 36.0–46.0)
Hemoglobin: 11.9 g/dL — ABNORMAL LOW (ref 12.0–15.0)
MCH: 26.8 pg (ref 26.0–34.0)
MCHC: 31.7 g/dL (ref 30.0–36.0)
MCV: 84.5 fL (ref 78.0–100.0)
Platelets: 202 10*3/uL (ref 150–400)
RBC: 4.44 MIL/uL (ref 3.87–5.11)
RDW: 15.5 % (ref 11.5–15.5)
WBC: 8.4 10*3/uL (ref 4.0–10.5)

## 2015-08-22 LAB — GLUCOSE, CAPILLARY: Glucose-Capillary: 91 mg/dL (ref 65–99)

## 2015-08-22 LAB — BASIC METABOLIC PANEL
Anion gap: 11 (ref 5–15)
BUN: 13 mg/dL (ref 6–20)
CO2: 21 mmol/L — ABNORMAL LOW (ref 22–32)
Calcium: 8.7 mg/dL — ABNORMAL LOW (ref 8.9–10.3)
Chloride: 110 mmol/L (ref 101–111)
Creatinine, Ser: 0.89 mg/dL (ref 0.44–1.00)
GFR calc Af Amer: >60 mL/min (ref >60)
GFR calc non Af Amer: >60 mL/min (ref >60)
Glucose, Bld: 96 mg/dL (ref 65–99)
Potassium: 3.9 mmol/L (ref 3.5–5.1)
Sodium: 142 mmol/L (ref 135–145)

## 2015-08-22 LAB — PHOSPHORUS: Phosphorus: 3.7 mg/dL (ref 2.5–4.6)

## 2015-08-22 LAB — MAGNESIUM: Magnesium: 1.8 mg/dL (ref 1.7–2.4)

## 2015-08-22 NOTE — Progress Notes (Signed)
ANTICOAGULATION CONSULT NOTE - Follow Up Consult  Pharmacy Consult for heparin Indication: DVT/PE/clot on tricuspid valve  Allergies  Allergen Reactions  . Evista [Raloxifene] Other (See Comments)    Blood clots - DVT and Pulmonary Embolism   . Fosamax [Alendronate Sodium]     esophagitis    Patient Measurements: Height: 5' 2"  (157.5 cm) Weight: 183 lb 6.8 oz (83.2 kg) IBW/kg (Calculated) : 50.1 Heparin Dosing Weight: 69 kg  Vital Signs: Temp: 98.1 F (36.7 C) (01/22 0737) Temp Source: Oral (01/22 0737) BP: 116/91 mmHg (01/22 0737) Pulse Rate: 72 (01/22 0737)  Labs:  Recent Labs  08/19/15 1625  08/20/15 0258 08/21/15 0310 08/22/15 0218 08/22/15 1215  HGB  --   < > 12.6 12.3 11.9*  --   HCT  --   --  40.5 38.3 37.5  --   PLT  --   --  183 180 202  --   HEPARINUNFRC  --   < > 0.52 0.47 0.27* 0.37  CREATININE  --   --  1.16* 1.02* 0.89  --   TROPONINI 0.20*  --   --  0.09*  --   --   < > = values in this interval not displayed.  Estimated Creatinine Clearance: 54.6 mL/min (by C-G formula based on Cr of 0.89).   Assessment: 75 y/o transfered from [facility] on heparin with DVT, saddle PE, and clot on tricuspid valve. HL now therapeutic at 0.37 after rate increase. Hgb ok at 11.9, plts wnl. No s/s of bleed. Patient met criteria for submassive PE (elevated trop, BNP, hemodynamic stable, heart strain) but EKOS was not done due to ECHO showing possible clot in transit on tricuspid valve. MD was concerned that EKOS woud dislodge clot causing another PE - s/p IVC filter 1/20  Goal of Therapy:  Heparin level 0.3-0.7 units/ml Monitor platelets by anticoagulation protocol: Yes   Plan:  Continue heparin gtt at 1,200 units/hr Check 8 hr confirmatory HL Monitor daily HL, CBC, s/s of bleed F/U plans for oral anticoag

## 2015-08-22 NOTE — Progress Notes (Signed)
[facility] TEAM 1 - Stepdown/ICU TEAM PROGRESS NOTE  Kimberly Watson [id number] DOB: Jan 04, 1941 DOA: 08/18/2015 PCP: Alesia Richards, MD  Admit HPI / Brief Narrative: 75 y.o. female with Hx of HTN, hyperlipidemia, GERD, IBS, hiatal hernia, and kidney stone who presented with shortness of breath, dizziness, bilateral leg edema, and chest pressure.  In the ED the patient was found to have elevated troponin 0.20, oxygen desaturated to 88% on room air, and AKI. Chest x-ray was negative. D-dimer was elevated.   Significant Events: 1/19 Venous duplex > LLE DVT 1/19 CTa acute submassive (intemediate risk) PE with R heart strain (RV/LV ratio 1.84) 1/19 TTE possible mass on tricuspid valve 1/20 IVC filter placement  HPI/Subjective: The pt is resting comfortably in bed.  She denies cp, n/v, or abdom pain.  She does report edema of the LLE, and occasional crampy pain in the leg.  No hemoptysis or melena or hematochezia.     Assessment/Plan:  Pulmonary embolism with RV Strain and clot in transit on echo - Extensie LLE DVT -due tosedentary life style + EVISTA + obesity  -PESI 3 (hypxoemia needing 5L [place] and age 25) but with high trop and lacate at admit with PASP 53  -Rx with IV heparin since 08/19/15 and temp IVC filter 08/20/15 -PCCM suggests we continue Heparin gtt total 5 days before NOAC v/s Coumadin -IVC filter x 3 months then IR outpt f/u for removal -pt appears to be stabilizing - wean O2 as able - ambulatory O2 exam   SIRS  Due to PE - NO EVIDENCE OF INFECTION / SEPSIS  Possible mass on tricuspid valve likely clot in transit - Cardiology recommending repeat echo before discharge - schedule TTE for 1/23  Transaminitis  Recheck in AM   HTN BP currently reasonably controlled   AKI - resolved  Hx of GERD and IBS  ?UTI No evidence to support a diagnosis of UTI - UA abnorm, but high count of epis - stop abx - follow clinically   Code Status: FULL Family  Communication: Discussed diagnosis and treatment with patient and son at great length Disposition Plan: SDU   Consultants: PCCM Cardiology  Antibiotics: ceftriazone 1/18 > 1/22  DVT prophylaxis: IV heparin   Objective: Blood pressure 116/91, pulse 72, temperature 98.1 F (36.7 C), temperature source Oral, resp. rate 22, height 5\' 2"  (1.575 m), weight 83.2 kg (183 lb 6.8 oz), SpO2 94 %.  Intake/Output Summary (Last 24 hours) at 08/22/15 1106 Last data filed at 08/22/15 0608  Gross per 24 hour  Intake 2835.42 ml  Output    250 ml  Net 2585.42 ml   Exam: General: No acute respiratory distress Lungs: Clear to auscultation bilaterally without wheezes or crackles Cardiovascular: Regular rate and rhythm without murmur gallop or rub normal S1 and S2 Abdomen: Nontender, obese, soft, bowel sounds positive, no rebound, no ascites, no appreciable mass Extremities: No significant cyanosis or clubbing; 1+ edema L LE - trace edema R LE   Data Reviewed: Basic Metabolic Panel:  Recent Labs Lab 08/19/15 0041 08/19/15 0407 08/20/15 0258 08/21/15 0310 08/22/15 0218  NA 143 143 143 141 142  K 4.8 4.9 3.8 3.7 3.9  CL 113* 109 109 111 110  CO2 16* 16* 23 21* 21*  GLUCOSE 192* 137* 95 96 96  BUN 31* 30* 26* 21* 13  CREATININE 1.52* 1.37* 1.16* 1.02* 0.89  CALCIUM 9.1 9.2 8.8* 8.4* 8.7*  MG  --   --  1.8 1.9 1.8  PHOS  --   --  3.9 3.8 3.7   CBC:  Recent Labs Lab 08/19/15 0041 08/19/15 0407 08/20/15 0258 08/21/15 0310 08/22/15 0218  WBC 12.8* 13.1* 10.4 9.1 8.4  HGB 14.7 15.0 12.6 12.3 11.9*  HCT 46.8* 47.8* 40.5 38.3 37.5  MCV 85.6 85.5 85.1 84.7 84.5  PLT 204 204 183 180 202   Liver Function Tests:  Recent Labs Lab 08/19/15 0041  AST 200*  ALT 216*  ALKPHOS 102  BILITOT 0.9  PROT 7.4  ALBUMIN 4.0    Recent Labs Lab 08/19/15 0041  LIPASE 28   Coags:  Recent Labs Lab 08/19/15 0041  INR 1.26    Recent Labs Lab 08/19/15 0041  APTT 28    Cardiac  Enzymes:  Recent Labs Lab 08/19/15 0041 08/19/15 0407 08/19/15 0900 08/19/15 1625 08/21/15 0310  TROPONINI 0.20* 0.25* 0.23* 0.20* 0.09*    CBG:  Recent Labs Lab 08/19/15 2120 08/20/15 0742 08/20/15 1137 08/21/15 0825 08/22/15 0916  GLUCAP 119* 82 84 90 91    Recent Results (from the past 240 hour(s))  Urine culture     Status: None   Collection Time: 08/19/15  1:28 AM  Result Value Ref Range Status   Specimen Description URINE, CLEAN CATCH  Final   Special Requests NONE  Final   Culture   Final    MULTIPLE SPECIES PRESENT, SUGGEST RECOLLECTION Performed at [facility]    Report Status 08/20/2015 FINAL  Final  Culture, blood (Routine X 2) w Reflex to ID Panel     Status: None (Preliminary result)   Collection Time: 08/19/15  4:00 AM  Result Value Ref Range Status   Specimen Description BLOOD LEFT HAND  Final   Special Requests IN PEDIATRIC BOTTLE 2CC  Final   Culture   Final    NO GROWTH 2 DAYS Performed at [facility]    Report Status PENDING  Incomplete  Culture, blood (Routine X 2) w Reflex to ID Panel     Status: None (Preliminary result)   Collection Time: 08/19/15  4:18 AM  Result Value Ref Range Status   Specimen Description BLOOD RIGHT HAND  Final   Special Requests BOTTLES DRAWN AEROBIC ONLY 5CC  Final   Culture   Final    NO GROWTH 2 DAYS Performed at [facility]    Report Status PENDING  Incomplete  Culture, blood (routine x 2)     Status: None (Preliminary result)   Collection Time: 08/19/15  4:29 PM  Result Value Ref Range Status   Specimen Description RIGHT ANTECUBITAL  Final   Special Requests BOTTLES DRAWN AEROBIC AND ANAEROBIC 5CC  Final   Culture   Final    NO GROWTH 2 DAYS Performed at [facility]    Report Status PENDING  Incomplete  Culture, blood (routine x 2)     Status: None (Preliminary result)   Collection Time: 08/19/15  4:45 PM  Result Value Ref Range Status   Specimen Description BLOOD  LEFT HAND  Final   Special Requests BOTTLES DRAWN AEROBIC AND ANAEROBIC 5CC  Final   Culture   Final    NO GROWTH 2 DAYS Performed at [facility]    Report Status PENDING  Incomplete  MRSA PCR Screening     Status: None   Collection Time: 08/19/15  8:33 PM  Result Value Ref Range Status   MRSA by PCR NEGATIVE NEGATIVE Final    Comment:        The GeneXpert MRSA  Assay (FDA approved for NASAL specimens only), is one component of a comprehensive MRSA colonization surveillance program. It is not intended to diagnose MRSA infection nor to guide or monitor treatment for MRSA infections.      Studies:   Recent x-ray studies have been reviewed in detail by the Attending Physician  Scheduled Meds:  Scheduled Meds: . aspirin  81 mg Oral QODAY  . calcium carbonate  1,250 mg Oral Daily  . cefTRIAXone (ROCEPHIN)  IV  2 g Intravenous Q24H  . cholecalciferol  1,000 Units Oral Daily  . clobetasol ointment   Topical BID  . magnesium oxide  200 mg Oral Daily  . metoprolol succinate  100 mg Oral Daily  . nystatin   Topical BID  . oxymetazoline  1 spray Each Nare BID  . sodium chloride  3 mL Intravenous Q12H    Time spent on care of this patient: 35 mins   Halsey Hammen T , MD   [facility] Office  [phone] Pager - Text Page per Shea Evans as per below:  On-Call/Text Page:      [email or web address]      password [email or web address]  If 7PM-7AM, please contact night-coverage [email or web address] Password [email or web address] 08/22/2015, 11:06 AM   LOS: 3 days

## 2015-08-22 NOTE — Progress Notes (Signed)
ANTICOAGULATION CONSULT NOTE - Follow Up Consult  Pharmacy Consult for Heparin  Indication: DVT/PE/clot on tricuspid valve  Allergies  Allergen Reactions  . Evista [Raloxifene] Other (See Comments)    Blood clots - DVT and Pulmonary Embolism   . Fosamax [Alendronate Sodium]     esophagitis   Patient Measurements: Height: 5\' 2"  (157.5 cm) Weight: 183 lb (83.008 kg) IBW/kg (Calculated) : 50.1 Vital Signs: Temp: 97.7 F (36.5 C) (01/22 0001) Temp Source: Oral (01/22 0001) BP: 124/75 mmHg (01/22 0200) Pulse Rate: 62 (01/22 0300)  Labs:  Recent Labs  08/19/15 0900  08/19/15 1625  08/20/15 0258 08/21/15 0310 08/22/15 0218  HGB  --   --   --   --  12.6 12.3 11.9*  HCT  --   --   --   --  40.5 38.3 37.5  PLT  --   --   --   --  183 180 202  HEPARINUNFRC  --   < >  --   < > 0.52 0.47 0.27*  CREATININE  --   --   --   --  1.16* 1.02* 0.89  TROPONINI 0.23*  --  0.20*  --   --  0.09*  --   < > = values in this interval not displayed.  Estimated Creatinine Clearance: 54.6 mL/min (by C-G formula based on Cr of 0.89).  Assessment: 75 y/o transfer from [facility] on heparin with DVT, saddle PE, and clot on tricuspid valve. S/p IVC filter. HL low this AM  Goal of Therapy:  Heparin level 0.3-0.7 units/ml Monitor platelets by anticoagulation protocol: Yes   Plan:  -Increase heparin to 1200 units/hr -1200 HL  Narda Bonds 08/22/2015,4:03 AM

## 2015-08-22 NOTE — Progress Notes (Signed)
ANTICOAGULATION CONSULT NOTE - Follow Up Consult  Pharmacy Consult for heparin Indication: DVT/PE/clot on tricuspid valve  Allergies  Allergen Reactions  . Evista [Raloxifene] Other (See Comments)    Blood clots - DVT and Pulmonary Embolism   . Fosamax [Alendronate Sodium]     esophagitis    Patient Measurements: Height: 5' 2"  (157.5 cm) Weight: 183 lb 6.8 oz (83.2 kg) IBW/kg (Calculated) : 50.1 Heparin Dosing Weight: 69 kg  Vital Signs: Temp: 97.5 F (36.4 C) (01/22 2014) Temp Source: Oral (01/22 2014) BP: 139/96 mmHg (01/22 2014) Pulse Rate: 68 (01/22 2014)  Labs:  Recent Labs  08/20/15 0258 08/21/15 0310 08/22/15 0218 08/22/15 1215 08/22/15 2046  HGB 12.6 12.3 11.9*  --   --   HCT 40.5 38.3 37.5  --   --   PLT 183 180 202  --   --   HEPARINUNFRC 0.52 0.47 0.27* 0.37 0.40  CREATININE 1.16* 1.02* 0.89  --   --   TROPONINI  --  0.09*  --   --   --     Estimated Creatinine Clearance: 54.6 mL/min (by C-G formula based on Cr of 0.89).   Assessment: 75 y/o transfered from [facility] on heparin with DVT, saddle PE, and clot on tricuspid valve.  Patient met criteria for submassive PE (elevated trop, BNP, hemodynamic stable, heart strain) but EKOS was not done due to ECHO showing possible clot in transit on tricuspid valve. MD was concerned that EKOS woud dislodge clot causing another PE - s/p IVC filter 1/20  Heparin level this evening remains therapeutic (HL 0.4 << 0.37, goal of 0.3-0.7). No overt s/sx of bleeding or issues with the drip n  Goal of Therapy:  Heparin level 0.3-0.7 units/ml Monitor platelets by anticoagulation protocol: Yes   Plan:  1. Continue heparin at 1200 units/hr (12 ml/hr) 2. Will continue to monitor for any signs/symptoms of bleeding and will follow up with heparin level in the a.m.  3. Will f/u plans for oral anticoagulation  Alycia Rossetti, PharmD, BCPS Clinical Pharmacist Pager: [phone] 08/22/2015 9:51 PM

## 2015-08-22 NOTE — Progress Notes (Signed)
SATURATION QUALIFICATIONS: (This note is used to comply with regulatory documentation for home oxygen)  Patient Saturations on Room Air at Rest = 87%  Patient Saturations on Room Air while Ambulating = 86-100%  Patient Saturations on 2 Liters of oxygen while Ambulating = 94-94%  Please briefly explain why patient needs home oxygen: Patient began walk on RA at 100% she began to drop her oxygen saturations during the walk and got as low as 86% on RA with activity.

## 2015-08-23 ENCOUNTER — Ambulatory Visit (HOSPITAL_COMMUNITY): Payer: Medicare Other

## 2015-08-23 DIAGNOSIS — I10 Essential (primary) hypertension: Secondary | ICD-10-CM

## 2015-08-23 DIAGNOSIS — I743 Embolism and thrombosis of arteries of the lower extremities: Secondary | ICD-10-CM

## 2015-08-23 LAB — CBC
HCT: 36.4 % (ref 36.0–46.0)
Hemoglobin: 11.6 g/dL — ABNORMAL LOW (ref 12.0–15.0)
MCH: 27 pg (ref 26.0–34.0)
MCHC: 31.9 g/dL (ref 30.0–36.0)
MCV: 84.8 fL (ref 78.0–100.0)
Platelets: 187 10*3/uL (ref 150–400)
RBC: 4.29 MIL/uL (ref 3.87–5.11)
RDW: 15.4 % (ref 11.5–15.5)
WBC: 8.1 10*3/uL (ref 4.0–10.5)

## 2015-08-23 LAB — COMPREHENSIVE METABOLIC PANEL
ALT: 64 U/L — ABNORMAL HIGH (ref 14–54)
AST: 26 U/L (ref 15–41)
Albumin: 2.6 g/dL — ABNORMAL LOW (ref 3.5–5.0)
Alkaline Phosphatase: 60 U/L (ref 38–126)
Anion gap: 8 (ref 5–15)
BUN: 11 mg/dL (ref 6–20)
CO2: 19 mmol/L — ABNORMAL LOW (ref 22–32)
Calcium: 8.4 mg/dL — ABNORMAL LOW (ref 8.9–10.3)
Chloride: 114 mmol/L — ABNORMAL HIGH (ref 101–111)
Creatinine, Ser: 0.85 mg/dL (ref 0.44–1.00)
GFR calc Af Amer: >60 mL/min (ref >60)
GFR calc non Af Amer: >60 mL/min (ref >60)
Glucose, Bld: 106 mg/dL — ABNORMAL HIGH (ref 65–99)
Potassium: 3.9 mmol/L (ref 3.5–5.1)
Sodium: 141 mmol/L (ref 135–145)
Total Bilirubin: 0.5 mg/dL (ref 0.3–1.2)
Total Protein: 4.9 g/dL — ABNORMAL LOW (ref 6.5–8.1)

## 2015-08-23 LAB — HEPARIN LEVEL (UNFRACTIONATED): Heparin Unfractionated: 0.4 IU/mL (ref 0.30–0.70)

## 2015-08-23 LAB — GLUCOSE, CAPILLARY
Glucose-Capillary: 87 mg/dL (ref 65–99)
Glucose-Capillary: 82 mg/dL (ref 65–99)
Glucose-Capillary: 106 mg/dL — ABNORMAL HIGH (ref 65–99)

## 2015-08-23 MED ORDER — ALBUTEROL SULFATE (2.5 MG/3ML) 0.083% IN NEBU: 2.5000 mg | INHALATION_SOLUTION | RESPIRATORY_TRACT | Status: DC | PRN

## 2015-08-23 NOTE — Care Management Important Message (Signed)
Important Message  Patient Details  Name: Kimberly Watson MRN: [id number] Date of Birth: 02/13/1941   Medicare Important Message Given:  Yes    Barb Merino Darry Kelnhofer 08/23/2015, 2:01 PM

## 2015-08-23 NOTE — Progress Notes (Signed)
  Echocardiogram 2D Echocardiogram has been performed.  Kimberly Watson 08/23/2015, 10:35 AM

## 2015-08-23 NOTE — Progress Notes (Signed)
SATURATION QUALIFICATIONS: (This note is used to comply with regulatory documentation for home oxygen)  Patient Saturations on Room Air at Rest = 90-93%  Patient Saturations on Room Air while Ambulating = 85-87%  Patient Saturations on 3 Liters of oxygen while Ambulating = 90-93%  Please briefly explain why patient needs home oxygen:Pt desat without O2 but stays >90% with O2. May need home O2.  Will continue PT. Thanks.  Kimberly Watson [phone] (pager)

## 2015-08-23 NOTE — Progress Notes (Signed)
[place] TEAM 1 - Stepdown/ICU TEAM PROGRESS NOTE  Kimberly Watson [id number] DOB: 12-Apr-1941 DOA: 08/18/2015 PCP: Alesia Richards, MD  Admit HPI / Brief Narrative: 75 y.o. female with Hx of HTN, hyperlipidemia, GERD, IBS, hiatal hernia, and kidney stone who presented with shortness of breath, dizziness, bilateral leg edema, and chest pressure.  In the ED the patient was found to have elevated troponin 0.20, oxygen desaturated to 88% on room air, and AKI. Chest x-ray was negative. D-dimer was elevated.   Significant Events: 1/19 Venous duplex > LLE DVT 1/19 CTa acute submassive (intemediate risk) PE with R heart strain (RV/LV ratio 1.84) 1/19 TTE possible mass on tricuspid valve 1/20 IVC filter placement  HPI/Subjective: The patient is resting comfortably in a bedside chair.  She was able to ambulate some today but did desaturate somewhat.  She denies chest pain fevers chills nausea vomiting.  She feels quite weak at this time.   Assessment/Plan:  Pulmonary embolism with RV Strain and clot in transit on echo - Extensie LLE DVT -due tosedentary life style + EVISTA + obesity  -PESI 3 (hypxoemia needing 5L [place] and age 6) but with high trop and lacate at admit with PASP 53  -Rx with IV heparin since 08/19/15 and temp IVC filter 08/20/15 -PCCM suggests we continue Heparin gtt total 5 days before NOAC v/s Coumadin (will complete 5 days of tx at ~2am on 1/24) - pt is leaning toward a NOAC, but wants to await word concerning her insurance coverage before choosing specific agent  -IVC filter x 3 months then IR outpt f/u for removal -pt appears to be stabilizing - wean O2 as able - ambulatory O2 exam again in 24-48hrs as patient borderline for requiring O2 w/ exertion   SIRS  Due to PE - NO EVIDENCE OF INFECTION / SEPSIS  Possible mass on tricuspid valve Felt to have been a clot in transit - repeat TTE 1/23 noted no evidence of previously noted mass/clot  Transaminitis    Resolving   HTN BP reasonably controlled   AKI - resolved  Hx of GERD and IBS  ?UTI No evidence to support a diagnosis of UTI - UA abnorm, but high count of epis - stopped abx - follow clinically   Code Status: FULL Family Communication: Discussed diagnosis and treatment with patient and son at great length again today, including benefits/risks associated w/ coumadin v/s NOAC  Disposition Plan: SDU   Consultants: PCCM Cardiology  Antibiotics: ceftriazone 1/18 > 1/22  DVT prophylaxis: IV heparin   Objective: Blood pressure 144/81, pulse 57, temperature 97.7 F (36.5 C), temperature source Oral, resp. rate 23, height 5\' 2"  (1.575 m), weight 84.324 kg (185 lb 14.4 oz), SpO2 97 %.  Intake/Output Summary (Last 24 hours) at 08/23/15 1451 Last data filed at 08/23/15 0900  Gross per 24 hour  Intake 2261.4 ml  Output    700 ml  Net 1561.4 ml   Exam: General: No acute respiratory distress - alert and comfortable  Lungs: Clear to auscultation bilaterally - no wheezes or crackles Cardiovascular: Regular rate and rhythm without murmur gallop or rub  Abdomen: Nontender, obese, soft, bowel sounds positive, no rebound, no ascites, no appreciable mass Extremities: No significant cyanosis/clubbing; 1+ edema L LE - trace edema R LE   Data Reviewed: Basic Metabolic Panel:  Recent Labs Lab 08/19/15 0407 08/20/15 0258 08/21/15 0310 08/22/15 0218 08/23/15 0256  NA 143 143 141 142 141  K 4.9 3.8 3.7 3.9 3.9  CL  109 109 111 110 114*  CO2 16* 23 21* 21* 19*  GLUCOSE 137* 95 96 96 106*  BUN 30* 26* 21* 13 11  CREATININE 1.37* 1.16* 1.02* 0.89 0.85  CALCIUM 9.2 8.8* 8.4* 8.7* 8.4*  MG  --  1.8 1.9 1.8  --   PHOS  --  3.9 3.8 3.7  --    CBC:  Recent Labs Lab 08/19/15 0407 08/20/15 0258 08/21/15 0310 08/22/15 0218 08/23/15 0256  WBC 13.1* 10.4 9.1 8.4 8.1  HGB 15.0 12.6 12.3 11.9* 11.6*  HCT 47.8* 40.5 38.3 37.5 36.4  MCV 85.5 85.1 84.7 84.5 84.8  PLT 204 183 180 202  187   Liver Function Tests:  Recent Labs Lab 08/19/15 0041 08/23/15 0256  AST 200* 26  ALT 216* 64*  ALKPHOS 102 60  BILITOT 0.9 0.5  PROT 7.4 4.9*  ALBUMIN 4.0 2.6*    Recent Labs Lab 08/19/15 0041  LIPASE 28   Coags:  Recent Labs Lab 08/19/15 0041  INR 1.26    Recent Labs Lab 08/19/15 0041  APTT 28    Cardiac Enzymes:  Recent Labs Lab 08/19/15 0041 08/19/15 0407 08/19/15 0900 08/19/15 1625 08/21/15 0310  TROPONINI 0.20* 0.25* 0.23* 0.20* 0.09*    CBG:  Recent Labs Lab 08/20/15 1137 08/21/15 0825 08/22/15 0916 08/23/15 0802 08/23/15 1239  GLUCAP 84 90 91 87 82    Recent Results (from the past 240 hour(s))  Urine culture     Status: None   Collection Time: 08/19/15  1:28 AM  Result Value Ref Range Status   Specimen Description URINE, CLEAN CATCH  Final   Special Requests NONE  Final   Culture   Final    MULTIPLE SPECIES PRESENT, SUGGEST RECOLLECTION Performed at [facility]    Report Status 08/20/2015 FINAL  Final  Culture, blood (Routine X 2) w Reflex to ID Panel     Status: None (Preliminary result)   Collection Time: 08/19/15  4:00 AM  Result Value Ref Range Status   Specimen Description BLOOD LEFT HAND  Final   Special Requests IN PEDIATRIC BOTTLE 2CC  Final   Culture   Final    NO GROWTH 4 DAYS Performed at [facility]    Report Status PENDING  Incomplete  Culture, blood (Routine X 2) w Reflex to ID Panel     Status: None (Preliminary result)   Collection Time: 08/19/15  4:18 AM  Result Value Ref Range Status   Specimen Description BLOOD RIGHT HAND  Final   Special Requests BOTTLES DRAWN AEROBIC ONLY 5CC  Final   Culture   Final    NO GROWTH 4 DAYS Performed at [facility]    Report Status PENDING  Incomplete  Culture, blood (routine x 2)     Status: None (Preliminary result)   Collection Time: 08/19/15  4:29 PM  Result Value Ref Range Status   Specimen Description RIGHT ANTECUBITAL  Final    Special Requests BOTTLES DRAWN AEROBIC AND ANAEROBIC 5CC  Final   Culture   Final    NO GROWTH 4 DAYS Performed at [facility]    Report Status PENDING  Incomplete  Culture, blood (routine x 2)     Status: None (Preliminary result)   Collection Time: 08/19/15  4:45 PM  Result Value Ref Range Status   Specimen Description BLOOD LEFT HAND  Final   Special Requests BOTTLES DRAWN AEROBIC AND ANAEROBIC 5CC  Final   Culture  Final    NO GROWTH 4 DAYS Performed at [facility]    Report Status PENDING  Incomplete  MRSA PCR Screening     Status: None   Collection Time: 08/19/15  8:33 PM  Result Value Ref Range Status   MRSA by PCR NEGATIVE NEGATIVE Final    Comment:        The GeneXpert MRSA Assay (FDA approved for NASAL specimens only), is one component of a comprehensive MRSA colonization surveillance program. It is not intended to diagnose MRSA infection nor to guide or monitor treatment for MRSA infections.      Studies:   Recent x-ray studies have been reviewed in detail by the Attending Physician  Scheduled Meds:  Scheduled Meds: . aspirin  81 mg Oral QODAY  . calcium carbonate  1,250 mg Oral Daily  . cholecalciferol  1,000 Units Oral Daily  . clobetasol ointment   Topical BID  . magnesium oxide  200 mg Oral Daily  . metoprolol succinate  100 mg Oral Daily  . nystatin   Topical BID  . oxymetazoline  1 spray Each Nare BID  . sodium chloride  3 mL Intravenous Q12H    Time spent on care of this patient: 35 mins   Shivan Hodes T , MD   [facility] Office  [phone] Pager - Text Page per Shea Evans as per below:  On-Call/Text Page:      [email or web address]      password [email or web address]  If 7PM-7AM, please contact night-coverage [email or web address] Password [email or web address] 08/23/2015, 2:51 PM   LOS: 4 days

## 2015-08-23 NOTE — Evaluation (Signed)
Physical Therapy Evaluation Patient Details Name: Kimberly Watson MRN: [id number] DOB: March 16, 1941 Today's Date: 08/23/2015   History of Present Illness  75 yo female admitted with troponin 0.20 oxygen saturation 88%, PE with RV strain and clot in transit on echo, extensive LLE DVT.  TTE scheduled for 08/23/15 PMH: HTN, GERD, IBS, Hiatal hernia, kidney stones  Clinical Impression  Pt admitted with above diagnosis. Pt currently with functional limitations due to the deficits listed below (see PT Problem List). Pt able to ambulate with min to min guard assist.  Feel that pt should progress well. May need home O2 as she is desatting at times.  Pt will benefit from skilled PT to increase their independence and safety with mobility to allow discharge to the venue listed below.    Follow Up Recommendations Home health PT;Supervision - Intermittent [facility])    Equipment Recommendations  Rolling walker with 5" wheels;3in1 (PT)    Recommendations for Other Services       Precautions / Restrictions Precautions Precautions: Fall Precaution Comments: watch oxygen Restrictions Weight Bearing Restrictions: No      Mobility  Bed Mobility Overal bed mobility: Needs Assistance Bed Mobility: Supine to Sit     Supine to sit: Supervision     General bed mobility comments: incr time but able to get to EOB.  Transfers Overall transfer level: Needs assistance Equipment used: Rolling walker (2 wheeled) Transfers: Sit to/from Stand Sit to Stand: Min guard;Min assist;+2 safety/equipment         General transfer comment: required BIL UE  Ambulation/Gait Ambulation/Gait assistance: Min guard;Min assist;+2 safety/equipment Ambulation Distance (Feet): 200 Feet Assistive device: Rolling walker (2 wheeled) Gait Pattern/deviations: Step-through pattern;Decreased stride length;Trunk flexed   Gait velocity interpretation: Below normal speed for age/gender General Gait Details: Pt was able to  ambulate with RW with fairly steady gait overall.  Pt felt dizzy initially but was able to ambulate with RW and little physical support.  Occasional cues to stay close to RW.    Stairs            Wheelchair Mobility    Modified Rankin (Stroke Patients Only)       Balance Overall balance assessment: Needs assistance Sitting-balance support: No upper extremity supported;Feet supported Sitting balance-Leahy Scale: Fair     Standing balance support: Bilateral upper extremity supported;During functional activity Standing balance-Leahy Scale: Poor Standing balance comment: relies on UE support.                              Pertinent Vitals/Pain Pain Assessment: No/denies pain    SATURATION QUALIFICATIONS: (This note is used to comply with regulatory documentation for home oxygen)  Patient Saturations on Room Air at Rest = 90-93%  Patient Saturations on Room Air while Ambulating = 85-87%  Patient Saturations on 3 Liters of oxygen while Ambulating = 90-93%  Please briefly explain why patient needs home oxygen:Pt desat without O2 but stays >90% with O2. May need home O2.  Will continue PT  Home Living Family/patient expects to be discharged to:: Private residence Living Arrangements: Alone (husband passed in [place]) Available Help at Discharge: Friend(s);Available PRN/intermittently (best friend retired PT and next relative is in [place]) Type of Home: House           Additional Comments: son present on evaluation from [place] and younger sister will be staying over the weekend to (A). pt other wise will be home alone without (  A).     Prior Function Level of Independence: Independent               Hand Dominance   Dominant Hand: Right    Extremity/Trunk Assessment   Upper Extremity Assessment: Defer to OT evaluation           Lower Extremity Assessment: Generalized weakness      Cervical / Trunk Assessment: Kyphotic   Communication   Communication: No difficulties  Cognition Arousal/Alertness: Awake/alert Behavior During Therapy: WFL for tasks assessed/performed Overall Cognitive Status: Within Functional Limits for tasks assessed                      General Comments      Exercises General Exercises - Lower Extremity Ankle Circles/Pumps: AROM;Both;5 reps;Seated Long Arc Quad: AROM;Both;5 reps;Seated      Assessment/Plan    PT Assessment Patient needs continued PT services  PT Diagnosis Generalized weakness   PT Problem List Decreased activity tolerance;Decreased balance;Decreased mobility;Decreased knowledge of use of DME;Decreased safety awareness;Decreased knowledge of precautions  PT Treatment Interventions DME instruction;Gait training;Functional mobility training;Therapeutic activities;Therapeutic exercise;Balance training;Patient/family education   PT Goals (Current goals can be found in the Care Plan section) Acute Rehab PT Goals Patient Stated Goal: to get back to being independent PT Goal Formulation: With patient Time For Goal Achievement: 08/30/15 Potential to Achieve Goals: Good    Frequency Min 3X/week   Barriers to discharge Decreased caregiver support      Co-evaluation               End of Session Equipment Utilized During Treatment: Gait belt;Oxygen Activity Tolerance: Patient limited by fatigue Patient left: in chair;with call bell/phone within reach;with family/visitor present Nurse Communication: Mobility status         Time: 1145-1210 PT Time Calculation (min) (ACUTE ONLY): 25 min   Charges:   PT Evaluation $PT Eval Moderate Complexity: 1 Procedure PT Treatments $Gait Training: 8-22 mins   PT G CodesDenice Watson 09-13-2015, 4:00 PM  Kimberly Watson,PT Acute Rehabilitation [phone] (pager)

## 2015-08-23 NOTE — Progress Notes (Signed)
Pt A/0x4, VSS. No complaints. Transferred to [id number].

## 2015-08-23 NOTE — Progress Notes (Signed)
ANTICOAGULATION CONSULT NOTE - Follow Up Consult  Pharmacy Consult for heparin Indication: DVT/PE/clot on tricuspid valve  Allergies  Allergen Reactions  . Evista [Raloxifene] Other (See Comments)    Blood clots - DVT and Pulmonary Embolism   . Fosamax [Alendronate Sodium]     esophagitis    Patient Measurements: Height: 5' 2"  (157.5 cm) Weight: 185 lb 14.4 oz (84.324 kg) IBW/kg (Calculated) : 50.1 Heparin Dosing Weight: 69 kg  Vital Signs: Temp: 97.8 F (36.6 C) (01/23 0803) Temp Source: Oral (01/23 0803) BP: 147/83 mmHg (01/23 1013) Pulse Rate: 76 (01/23 1013)  Labs:  Recent Labs  08/21/15 0310 08/22/15 0218 08/22/15 1215 08/22/15 2046 08/23/15 0256  HGB 12.3 11.9*  --   --  11.6*  HCT 38.3 37.5  --   --  36.4  PLT 180 202  --   --  187  HEPARINUNFRC 0.47 0.27* 0.37 0.40 0.40  CREATININE 1.02* 0.89  --   --  0.85  TROPONINI 0.09*  --   --   --   --     Estimated Creatinine Clearance: 57.6 mL/min (by C-G formula based on Cr of 0.85).   Assessment: 75 y/o transfered from [facility] on heparin with DVT, saddle PE, and clot on tricuspid valve.  Patient met criteria for submassive PE (elevated trop, BNP, hemodynamic stable, heart strain) but EKOS was not done due to ECHO showing possible clot in transit on tricuspid valve. MD was concerned that EKOS woud dislodge clot causing another PE - s/p IVC filter 1/20  Heparin level this evening remains therapeutic at 0.4. No overt s/sx of bleeding or issues with the drip. Hgb low but stable. Plt wnl   Goal of Therapy:  Heparin level 0.3-0.7 units/ml Monitor platelets by anticoagulation protocol: Yes   Plan:  1. Continue heparin at 1200 units/hr (12 ml/hr) 2. Will continue to monitor for any signs/symptoms of bleeding and will follow up with heparin level in the a.m.  3. Will f/u plans for oral anticoagulation  Albertina Parr, PharmD., BCPS Clinical Pharmacist Pager [phone]

## 2015-08-23 NOTE — Evaluation (Signed)
Occupational Therapy Evaluation Patient Details Name: Kimberly Watson MRN: [id number] DOB: 12-27-1940 Today's Date: 08/23/2015    History of Present Illness 75 yo female admitted with troponin 0.20 oxygen saturation 88%, PE with RV strain and clot in transit on echo, extensive LLE DVT.  TTE scheduled for 08/23/15 PMH: HTN, GERD, IBS, Hiatal hernia, kidney stones   Clinical Impression   PT admitted with lle dvt AND pe. Pt currently with functional limitiations due to the deficits listed below (see OT problem list). Lives alone in a house with children that live out of state. Pt has a best friend that can give PRN as needed.  Pt will benefit from skilled OT to increase their independence and safety with adls and balance to allow discharge HHOT with oxygen. OT to monitor progress to determine if additional Assistance required. Pt with decr oxygen with adls this session.       Follow Up Recommendations  Home health OT    Equipment Recommendations  3 in 1 bedside comode;Other (comment) (RW)    Recommendations for Other Services       Precautions / Restrictions Precautions Precautions: Fall Precaution Comments: watch oxygen Restrictions Weight Bearing Restrictions: No      Mobility Bed Mobility               General bed mobility comments: in chair on arrival  Transfers Overall transfer level: Needs assistance Equipment used: Rolling walker (2 wheeled) Transfers: Sit to/from Stand Sit to Stand: Min guard         General transfer comment: required BIL UE    Balance                                            ADL Overall ADL's : Needs assistance/impaired     Grooming: Wash/dry face;Oral care;Min guard;Standing Grooming Details (indicate cue type and reason): able to static stand on RA and complete oral care with oxygen 88%- 90%             Lower Body Dressing: Supervision/safety;Sit to/from stand Lower Body Dressing Details (indicate  cue type and reason): able to cross bil LE in sitting to compelte LB dressing Toilet Transfer: Min guard;Ambulation;RW           Functional mobility during ADLs: Supervision/safety;Rolling walker General ADL Comments: Pt on RA 85% with Dyspnea noted  , DOE 3out 4 ( unable to complete sentence) oxygen reapplied . Pt educated on self awareness and noting changes in breath support to take rest breaks. Pt states "now that you say it I was doing that before too" Pt with decr awareness to self monitor oxygen     Vision     Perception     Praxis      Pertinent Vitals/Pain Pain Assessment: No/denies pain     Hand Dominance Right   Extremity/Trunk Assessment Upper Extremity Assessment Upper Extremity Assessment: Generalized weakness   Lower Extremity Assessment Lower Extremity Assessment: Defer to PT evaluation   Cervical / Trunk Assessment Cervical / Trunk Assessment: Kyphotic   Communication Communication Communication: No difficulties   Cognition Arousal/Alertness: Awake/alert Behavior During Therapy: WFL for tasks assessed/performed Overall Cognitive Status: Within Functional Limits for tasks assessed                     General Comments       Exercises  Shoulder Instructions      Home Living Family/patient expects to be discharged to:: Private residence Living Arrangements: Alone (husband passed in [place]) Available Help at Discharge: Friend(s);Available PRN/intermittently (best friend retired PT and next relative is in [place]) Type of Home: House             Bathroom Shower/Tub: Teacher, early years/pre: Standard         Additional Comments: son present on evaluation from [place] and younger sister will be staying over the weekend to (A). pt other wise will be home alone without (A).       Prior Functioning/Environment Level of Independence: Independent             OT Diagnosis: Generalized weakness   OT  Problem List: Decreased strength;Decreased activity tolerance;Impaired balance (sitting and/or standing);Decreased cognition;Decreased safety awareness;Decreased knowledge of use of DME or AE;Decreased knowledge of precautions;Cardiopulmonary status limiting activity   OT Treatment/Interventions: Self-care/ADL training;Energy conservation;DME and/or AE instruction;Therapeutic activities;Patient/family education;Balance training;Therapeutic exercise    OT Goals(Current goals can be found in the care plan section) Acute Rehab OT Goals Patient Stated Goal: to get back to being independent OT Goal Formulation: With patient/family Time For Goal Achievement: 09/06/15 Potential to Achieve Goals: Good  OT Frequency: Min 2X/week   Barriers to D/C: Decreased caregiver support  lives alone       Co-evaluation              End of Session Equipment Utilized During Treatment: Gait belt;Rolling walker Nurse Communication: Mobility status;Precautions  Activity Tolerance: Patient tolerated treatment well Patient left: in chair;with call bell/phone within reach;with family/visitor present   Time: 0912-0929 OT Time Calculation (min): 17 min Charges:  OT General Charges $OT Visit: 1 Procedure OT Evaluation $OT Eval Moderate Complexity: 1 Procedure G-Codes:    Peri Maris 09-19-15, 10:24 AM   Jeri Modena   OTR/L PagerIP:[phone] Office: [phone] .

## 2015-08-24 LAB — CULTURE, BLOOD (ROUTINE X 2)
Culture: NO GROWTH
Culture: NO GROWTH
Culture: NO GROWTH
Culture: NO GROWTH

## 2015-08-24 LAB — CBC
HCT: 38.8 % (ref 36.0–46.0)
Hemoglobin: 12.3 g/dL (ref 12.0–15.0)
MCH: 26.7 pg (ref 26.0–34.0)
MCHC: 31.7 g/dL (ref 30.0–36.0)
MCV: 84.2 fL (ref 78.0–100.0)
Platelets: 219 10*3/uL (ref 150–400)
RBC: 4.61 MIL/uL (ref 3.87–5.11)
RDW: 15.5 % (ref 11.5–15.5)
WBC: 7.8 10*3/uL (ref 4.0–10.5)

## 2015-08-24 LAB — HEPARIN LEVEL (UNFRACTIONATED): Heparin Unfractionated: 0.43 IU/mL (ref 0.30–0.70)

## 2015-08-24 MED ORDER — APIXABAN 5 MG PO TABS: 5.0000 mg | ORAL_TABLET | Freq: Two times a day (BID) | ORAL | Status: DC

## 2015-08-24 MED ORDER — APIXABAN 5 MG PO TABS
10.0000 mg | ORAL_TABLET | Freq: Two times a day (BID) | ORAL | Status: DC
  Administered 2015-08-24: 10 mg via ORAL
  Filled 2015-08-24: qty 2

## 2015-08-24 MED ORDER — FUROSEMIDE 40 MG PO TABS: 40.0000 mg | ORAL_TABLET | Freq: Every day | ORAL | Status: DC

## 2015-08-24 MED ORDER — APIXABAN 5 MG PO TABS: 10.0000 mg | ORAL_TABLET | Freq: Two times a day (BID) | ORAL | Status: DC

## 2015-08-24 MED ORDER — APIXABAN (ELIQUIS) EDUCATION KIT FOR DVT/PE PATIENTS
PACK | Freq: Once | Status: AC
  Administered 2015-08-24: 10:00:00
  Filled 2015-08-24: qty 1

## 2015-08-24 MED ORDER — FUROSEMIDE 10 MG/ML IJ SOLN
40.0000 mg | Freq: Once | INTRAMUSCULAR | Status: AC
  Administered 2015-08-24: 40 mg via INTRAVENOUS
  Filled 2015-08-24: qty 4

## 2015-08-24 NOTE — Care Management Note (Signed)
Case Management Note  Patient Details  Name: Kimberly Watson MRN: [id number] Date of Birth: 1940-11-05  Subjective/Objective:                  Date- 08-24-15 Initial Assessment Patient transferred from [facility] with patient at the bedside along with son.  Introduced self as Tourist information centre manager and explained role in discharge planning and how to be reached.  Verified patient lives in  [place] in a house alone. Son is involved in care.  Verified patient anticipates to go home alone at time of discharge and will have  limited supervision by various family members at this time to best of their knowledge.  Patient has DME from her deceased 26 year old father whom she cared for in the home. Patient denies needing any DME.  Patient denied needing help with their medication.   Patient states they currently receive [facility] services through no one.   Admission Comments: Patient admitted for acute respiratory failure, transferred from [facility], will DC today. Will refer to [facility] for [facility] RN PT.   Carles Collet RN BSN CM [phone]   Action/Plan:  DC to home with [facility] through [facility].  Expected Discharge Date:   (UNKNOWN)               Expected Discharge Plan:  Home/Self Care  In-House Referral:     Discharge planning Services  CM Consult  Post Acute Care Choice:    Choice offered to:     DME Arranged:    DME Agency:     HH Arranged:  PT, RN [place] Agency:  [facility]  Status of Service:  In process, will continue to follow  Medicare Important Message Given:  Yes Date Medicare IM Given:    Medicare IM give by:    Date Additional Medicare IM Given:    Additional Medicare Important Message give by:     If discussed at [place] of Stay Meetings, dates discussed:    Additional Comments:  Carles Collet, RN 08/24/2015, 11:38 AM

## 2015-08-24 NOTE — Progress Notes (Signed)
SATURATION QUALIFICATIONS: (This note is used to comply with regulatory documentation for home oxygen)  Patient Saturations on Room Air at Rest = 94%  Patient Saturations on Room Air while Ambulating = 85%  Patient Saturations on 2 Liters of oxygen while Ambulating = 95%  Please briefly explain why patient needs home oxygen:  Patient needs oxygen when ambulating to keep her saturations above 90

## 2015-08-24 NOTE — Progress Notes (Signed)
Occupational Therapy Treatment Patient Details Name: Kimberly Watson MRN: [id number] DOB: 12/04/1940 Today's Date: 08/24/2015    History of present illness 75 yo female admitted with troponin 0.20 oxygen saturation 88%, PE with RV strain and clot in transit on echo, extensive LLE DVT.  TTE scheduled for 08/23/15 PMH: HTN, GERD, IBS, Hiatal hernia, kidney stones   OT comments  Pt continues to demonstrate 02 desaturation to 87% with activity on RA, but rebounds to 94% with standing rest breaks and deep breathing. Educated pt in energy conservation strategies throughout session and reinforced with handout.    Follow Up Recommendations  Home health OT    Equipment Recommendations  None recommended by OT    Recommendations for Other Services      Precautions / Restrictions Precautions Precautions: Fall Precaution Comments: watch oxygen Restrictions Weight Bearing Restrictions: No       Mobility Bed Mobility               General bed mobility comments: pt in chair  Transfers Overall transfer level: Needs assistance Equipment used: Rolling walker (2 wheeled) Transfers: Sit to/from Stand Sit to Stand: Supervision         General transfer comment: Pt reporting a feeling of being off-balance with ringing ears when initially standing without device    Balance             Standing balance-Leahy Scale: Fair                     ADL Overall ADL's : Needs assistance/impaired     Grooming: Supervision/safety;Standing           Upper Body Dressing : Set up;Standing Upper Body Dressing Details (indicate cue type and reason): front opening gown Lower Body Dressing: Supervision/safety;Sit to/from stand   Toilet Transfer: Supervision/safety;Ambulation;RW   Toileting- Clothing Manipulation and Hygiene: Supervision/safety;Sit to/from stand       Functional mobility during ADLs: Supervision/safety;Rolling walker General ADL Comments: sats down to 87% on  RA with ambulation in hall, instructed in pacing, pursed lip breathing and taking standing rest breaks, recommended pt sit for showering, left energy conservation handout in room to reinforce education      Vision                     Perception     Praxis      Cognition   Behavior During Therapy: [facility] for tasks assessed/performed Overall Cognitive Status: Within Functional Limits for tasks assessed                       Extremity/Trunk Assessment               Exercises     Shoulder Instructions       General Comments      Pertinent Vitals/ Pain       Pain Assessment: Faces Faces Pain Scale: Hurts a little bit Pain Location: neck/head Pain Descriptors / Indicators: Aching  Home Living                                          Prior Functioning/Environment              Frequency Min 2X/week     Progress Toward Goals  OT Goals(current goals can now be found in the care plan section)  Progress  towards OT goals: Progressing toward goals  Acute Rehab OT Goals Patient Stated Goal: to get back to being independent  Plan Discharge plan remains appropriate    Co-evaluation                 End of Session Equipment Utilized During Treatment: Rolling walker   Activity Tolerance Patient tolerated treatment well   Patient Left  (walking with PT)   Nurse Communication          Time: [id number] OT Time Calculation (min): 15 min  Charges: OT General Charges $OT Visit: 1 Procedure OT Treatments $Self Care/Home Management : 8-22 mins  Malka So 08/24/2015, 12:05 PM  [phone]

## 2015-08-24 NOTE — Discharge Summary (Signed)
Kimberly Watson, is a 75 y.o. female  DOB 1941/04/21  MRN [id number].  Admission date:  08/18/2015  Admitting Physician  Chesley Mires, MD  Discharge Date:  08/24/2015   Primary MD  Alesia Richards, MD  Recommendations for primary care physician for things to follow:   Needs close outpatient follow-up with IR for IVC filter removal in 2-3 months. Also follow with [facility] hematology one time post discharge.  Check CBC CMP in 1 week  Admission Diagnosis  Dizziness [R42] Leg edema [R60.0] Pulmonary embolus (HCC) [I26.99] Acute congestive heart failure, unspecified congestive heart failure type ([place]) [I50.9]   Discharge Diagnosis  Dizziness [R42] Leg edema [R60.0] Pulmonary embolus (HCC) [I26.99] Acute congestive heart failure, unspecified congestive heart failure type ([place]) [I50.9]     Principal Problem:   Acute respiratory failure with hypoxia (HCC) Active Problems:   Essential hypertension   Mixed hyperlipidemia   GERD (gastroesophageal reflux disease)   Osteoporosis   Morbid obesity (HCC)   Shortness of breath   Dizziness   Chest pressure   Elevated troponin   UTI (lower urinary tract infection)   Generalized weakness   AKI (acute kidney injury) ([place])   Diarrhea from food ingestion bad.   Tachypnea with excertion   Acute respiratory distress (HCC)   DVT (deep venous thrombosis) ([place])   Acute saddle pulmonary embolism with acute cor pulmonale (HCC)      Past Medical History  Diagnosis Date  . Hypertension   . Osteoporosis   . GERD (gastroesophageal reflux disease)   . Nephrolithiasis   . Cataract   . Elevated hemoglobin A1c   . Leiomyoma     adnexal Leiomyoma removal in 1/11  . H/O left knee surgery     Past Surgical History  Procedure Laterality Date  . Joint replacement Left  total knee  . Uterine fibroid surgery    . Cholecystectomy         HPI  from the history and physical done on the day of admission:   75 y.o. female with Hx of HTN, hyperlipidemia, GERD, IBS, hiatal hernia, and kidney stone who presented with shortness of breath, dizziness, bilateral leg edema, and chest pressure.  In the ED the patient was found to have elevated troponin 0.20, oxygen desaturated to 88% on room air, and AKI. Chest x-ray was negative. D-dimer was elevated.       Hospital Course:     Pulmonary embolism with RV Strain and clot in transit on echo - Extensie LLE DVT -due tosedentary life style + EVISTA + obesity   -PESI 3 (hypxoemia needing 5L Protection and age 49) but with high trop and lacate at admit with PASP 53. She was admitted by pulmonary critical care and started on IV heparin drip on 08/19/2015 for 5 days. Thereafter she will be switched to Eliquis today. Also had evidence of left lower extremity DVT. Due to her clot burden patient underwent IVC filter placement while she was under the care of pulmonary critical care. IVC filter should  be removed in 2-3 months.  She is currently symptom free and oxygen free, eager to go home, she will follow with pulmonary, IR for IVC filter removal and one time with hematology for any hypercoagulable workup if needed. Will be discharged on request.   SIRS  Due to PE - NO EVIDENCE OF INFECTION / SEPSIS  Possible mass on tricuspid valve Felt to have been a clot in transit - repeat TTE 1/23 noted no evidence of previously noted mass/clot. Discussed with Dr. Stanford Breed cardiologist on 08/24/2015. No further workup or treatment. Patient denies any history of fever or chills or anything suggestive of endocarditis.  Transaminitis  Resolving , repeat CMP in a week.  HTN BP reasonably controlled   AKI - resolved  Hx of GERD and IBS - continue home regimen.  ?UTI No evidence to support a diagnosis of UTI - UA abnorm, but high count  of epis - stopped abx - follow clinically        Discharge Condition: Stable  Follow UP  Follow-up Information    Follow up with MCKEOWN,WILLIAM DAVID, MD. Schedule an appointment as soon as possible for a visit in 3 days.   Specialty:  Internal Medicine   Contact information:   [address] [place] [address] [phone]       Follow up with [facility] T, MD. Schedule an appointment as soon as possible for a visit in 2 months.   Specialty:  Interventional Radiology   Why:  remove IVC filter   Contact information:   [address] [place] [address] [phone]       Follow up with SOOD,VINEET, MD. Schedule an appointment as soon as possible for a visit in 3 weeks.   Specialty:  Pulmonary Disease   Why:  PE   Contact information:   [address] [address] [phone]       Follow up with [facility], NI, MD. Schedule an appointment as soon as possible for a visit in 2 months.   Specialty:  Hematology and Oncology   Why:  PE   Contact information:   [place] [address] [phone]        Consults obtained - PPCM, Cards  Diet and Activity recommendation: See Discharge Instructions below  Discharge Instructions       Discharge Instructions    Discharge instructions    Complete by:  As directed   Follow with Primary MD MCKEOWN,WILLIAM DAVID, MD in 7 days   Get CBC, CMP, 2 view Chest X ray checked  by Primary MD next visit.    Activity: As tolerated with Full fall precautions use walker/cane & assistance as needed   Disposition Home     Diet:   Heart Healthy  Check your Weight same time everyday, if you gain over 2 pounds, or you develop in leg swelling, experience more shortness of breath or chest pain, call your Primary MD immediately. Follow Cardiac Low Salt Diet and 1.5 lit/day fluid restriction.   On your next visit with your primary care physician please Get Medicines  reviewed and adjusted.   Please request your Prim.MD to go over all Hospital Tests and Procedure/Radiological results at the follow up, please get all Hospital records sent to your Prim MD by signing hospital release before you go home.   If you experience worsening of your admission symptoms, develop shortness of breath, life threatening emergency, suicidal or homicidal thoughts you must seek  medical attention immediately by calling 911 or calling your MD immediately  if symptoms less severe.  You Must read complete instructions/literature along with all the possible adverse reactions/side effects for all the Medicines you take and that have been prescribed to you. Take any new Medicines after you have completely understood and accpet all the possible adverse reactions/side effects.   Do not drive, operating heavy machinery, perform activities at heights, swimming or participation in water activities or provide baby sitting services if your were admitted for syncope or siezures until you have seen by Primary MD or a Neurologist and advised to do so again.  Do not drive when taking Pain medications.    Do not take more than prescribed Pain, Sleep and Anxiety Medications  Special Instructions: If you have smoked or chewed Tobacco  in the last 2 yrs please stop smoking, stop any regular Alcohol  and or any Recreational drug use.  Wear Seat belts while driving.   Please note  You were cared for by a hospitalist during your hospital stay. If you have any questions about your discharge medications or the care you received while you were in the hospital after you are discharged, you can call the unit and asked to speak with the hospitalist on call if the hospitalist that took care of you is not available. Once you are discharged, your primary care physician will handle any further medical issues. Please note that NO REFILLS for any discharge medications will be authorized once you are discharged,  as it is imperative that you return to your primary care physician (or establish a relationship with a primary care physician if you do not have one) for your aftercare needs so that they can reassess your need for medications and monitor your lab values.     Increase activity slowly    Complete by:  As directed              Discharge Medications       Medication List    STOP taking these medications        aspirin 81 MG tablet      TAKE these medications        apixaban 5 MG Tabs tablet  Commonly known as:  ELIQUIS  Take 2 tablets (10 mg total) by mouth 2 (two) times daily.     apixaban 5 MG Tabs tablet  Commonly known as:  ELIQUIS  Take 1 tablet (5 mg total) by mouth 2 (two) times daily.  Start taking on:  08/31/2015     calcium carbonate 600 MG Tabs tablet  Commonly known as:  OS-CAL  Take 600 mg by mouth daily.     cholecalciferol 1000 units tablet  Commonly known as:  VITAMIN D  Take 1,000 Units by mouth 1 day or 1 dose. Take 3 tablets daily.     clobetasol ointment 0.05 %  Commonly known as:  TEMOVATE  Apply topically 2 (two) times daily.     furosemide 40 MG tablet  Commonly known as:  LASIX  Take 40 mg by mouth daily.     MAGNESIUM PO  Take 1 tablet by mouth daily.     metoprolol succinate 100 MG 24 hr tablet  Commonly known as:  TOPROL-XL  TAKE 1 TABLET (100 MG TOTAL) BY MOUTH DAILY. TAKE WITH OR IMMEDIATELY FOLLOWING A MEAL.     nystatin 100000 UNIT/GM Powd  Apply to affected area after a shower daily or as needed  OVER THE COUNTER MEDICATION  Place 1 spray into both nostrils daily.     oxymetazoline 0.05 % nasal spray  Commonly known as:  AFRIN  Place 1 spray into both nostrils 2 (two) times daily.        Major procedures and Radiology Reports - PLEASE review detailed and final reports for all details, in brief -   1/19 Venous duplex > LLE DVT 1/19 CTa acute submassive (intemediate risk) PE with R heart strain (RV/LV ratio 1.84) 1/19  TTE possible mass on tricuspid valve 1/20 IVC filter placement Repeat TEE. No tricuspid valve mass, discussed with Dr. Stanford Breed on 08/24/2015 this likely was undissolved clot. Which has now resolved.    TTE  - Left ventricle: The cavity size was normal. Wall thickness was normal. Systolic function was normal. The estimated ejection fraction was in the range of 50% to 55%. Wall motion was normal; there were no regional wall motion abnormalities. Doppler parameters are consistent with abnormal left ventricular relaxation (grade 1 diastolic dysfunction). - Ventricular septum: The contour showed diastolic flattening and systolic flattening. - Mitral valve: There was mild regurgitation. - Right ventricle: The cavity size was mildly dilated. Systolic function was severely reduced. - Right atrium: The atrium was mildly dilated. - Tricuspid valve: There was moderate regurgitation. - Pulmonary arteries: Systolic pressure was moderately to severely increased. PA peak pressure: 54 mm Hg (S). - Pericardium, extracardiac: A trivial pericardial effusion was identified.  Impressions:  - Low normal LV systolic function; grade 1 diastolic dysfunction; D shaped septum; mld MR; mild RAE/RVE; severely reduced RV function; oscillating density on TV concerning for SBE; moderate TR; moderate to severe pulmonary hypertension.    TTE repeat  - Left ventricle: The cavity size was normal. Wall thickness was increased in a pattern of moderate LVH. Systolic function was normal. The estimated ejection fraction was in the range of 60% to 65%. Wall motion was normal; there were no regional wall motion abnormalities. Doppler parameters are consistent with abnormal left ventricular relaxation (grade 1 diastolic dysfunction). - Ventricular septum: The contour showed mild diastolic flattening and systolic flattening. These changes are consistent with RV pressure  overload. - Mitral valve: Transvalvular velocity was within the normal range. There was no evidence for stenosis. There was trivial regurgitation. - Right ventricle: The cavity size was mildly dilated. Wall thickness was normal. Systolic function was normal. - Right atrium: The atrium was mildly dilated. - Tricuspid valve: The oscillating mass previously seen on the anerior leaflet of the tricuspid valve is no longer visible, though the leaflet appears mildly thickened. There was moderate regurgitation. - Pulmonic valve: There was trivial regurgitation. - Pulmonary arteries: Systolic pressure was severely increased. PA peak pressure: 70 mm Hg (S). - Inferior vena cava: The vessel was dilated. The respirophasic diameter changes were blunted (< 50%), consistent with elevated central venous pressure.  Venous US  Findings consistent with acute deep vein thrombosis involving the  left femoral vein, left popliteal vein, left posterial tibial  vein, and left peroneal vein. - No evidence of deep vein thrombosis involving the left lower  extremity. - No evidence of Baker&'s cyst on the right or left.   Dg Chest 2 View  08/19/2015  CLINICAL DATA:  Acute onset of worsening shortness of breath on exertion. Sinus headache. Initial encounter. EXAM: CHEST  2 VIEW COMPARISON:  None. FINDINGS: The lungs are well-aerated and clear. There is no evidence of focal opacification, pleural effusion or pneumothorax. The heart is borderline enlarged. No  acute osseous abnormalities are seen. Clips are noted within the right upper quadrant, reflecting prior cholecystectomy. IMPRESSION: Borderline cardiomegaly.  Lungs remain grossly clear. Electronically Signed   By: Garald Balding M.D.   On: 08/19/2015 01:03   Ct Head Wo Contrast  08/19/2015  CLINICAL DATA:  75 year old female with dizziness and vertigo EXAM: CT HEAD WITHOUT CONTRAST TECHNIQUE: Contiguous axial images were obtained from the  base of the skull through the vertex without intravenous contrast. COMPARISON:  None. FINDINGS: The ventricles and sulci are appropriate in size for patient's age. Mild periventricular and deep white matter hypodensities represent chronic microvascular ischemic changes. There is no intracranial hemorrhage. No mass effect or midline shift identified. A 6 x 8 mm calcific density along the falx may represent a small calcified meningioma. The visualized paranasal sinuses and mastoid air cells are well aerated. The calvarium is intact. IMPRESSION: No acute intracranial hemorrhage. Mild age-related atrophy and chronic microvascular ischemic disease. If symptoms persist and there are no contraindications, MRI may provide better evaluation if clinically indicated Electronically Signed   By: Anner Crete M.D.   On: 08/19/2015 04:43   Ct Angio Chest Pe W/cm &/or Wo Cm  08/19/2015  CLINICAL DATA:  75 year old female with shortness of breath. Elevated D-dimer. Back pain. EXAM: CT ANGIOGRAPHY CHEST WITH CONTRAST TECHNIQUE: Multidetector CT imaging of the chest was performed using the standard protocol during bolus administration of intravenous contrast. Multiplanar CT image reconstructions and MIPs were obtained to evaluate the vascular anatomy. CONTRAST:  163mL OMNIPAQUE IOHEXOL 350 MG/ML SOLN COMPARISON:  No priors. FINDINGS: Mediastinum/Lymph Nodes: Massive saddle pulmonary embolus extending into main, lobar, segmental and subsegmental sized branches throughout the lungs bilaterally. Main pulmonary artery is mildly dilated measuring 3 cm in diameter. Severe right heart dilatation. Right ventricular diameter up to 59 mm. Left ventricular diameter up to 32 mm. Severe right to left bowing of the inter- ventricular septum, and right to left bowing of the interatrial septum all indicate markedly elevated right heart pressures. Small amount of pericardial fluid and/or thickening, unlikely to be of any hemodynamic  significance at this time. No pathologically enlarged mediastinal or hilar lymph nodes. Esophagus is unremarkable in appearance. No axillary lymphadenopathy. Lungs/Pleura: No acute consolidative airspace disease. No pleural effusions. No suspicious appearing pulmonary nodules or masses. Upper Abdomen: Reflux of contrast material into the IVC and hepatic veins. Otherwise, unremarkable. Musculoskeletal/Soft Tissues: There are no aggressive appearing lytic or blastic lesions noted in the visualized portions of the skeleton. Review of the MIP images confirms the above findings. IMPRESSION: 1. Positive for acute PE with CT evidence of right heart strain (RV/LV Ratio = 1.84) consistent with at least submassive (intermediate risk) PE. The presence of right heart strain has been associated with an increased risk of morbidity and mortality. Please activate Code PE by paging [phone]. 2. Trace amount of pericardial fluid and/or thickening, unlikely to be of any hemodynamic significance at this time. Critical Value/emergent results were called by telephone at the time of interpretation on 08/19/2015 at 6:39 pm to Dr. Wendee Beavers, who verbally acknowledged these results. Electronically Signed   By: Vinnie Langton M.D.   On: 08/19/2015 18:41   Ir Ivc Filter Plmt / S&i /img Guid/mod Sed  08/20/2015  CLINICAL DATA:  Acute saddle pulmonary embolism and left lower extremity DVT. The patient requires placement of an IVC filter. EXAM: 1. ULTRASOUND GUIDANCE FOR VASCULAR ACCESS OF THE RIGHT INTERNAL JUGULAR VEIN. 2. IVC VENOGRAM. 3. PERCUTANEOUS IVC FILTER PLACEMENT. ANESTHESIA/SEDATION: 1.0 mg  IV Versed; 50 mcg IV Fentanyl. Total Moderate Sedation Time 20 minutes. The patient's level of consciousness and physiological status was monitored continuously during the procedure by Radiology nursing. CONTRAST:  CO2 gas FLUOROSCOPY TIME:  3 minutes and 36 seconds. PROCEDURE: The procedure, risks, benefits, and alternatives were explained to  the patient. Questions regarding the procedure were encouraged and answered. The patient understands and consents to the procedure. A time-out was performed prior to the procedure. The right neck was prepped with chlorhexidine in a sterile fashion, and a sterile drape was applied covering the operative field. A sterile gown and sterile gloves were used for the procedure. Local anesthesia was provided with 1% Lidocaine. Ultrasound was used to confirm patency of the right internal jugular vein. Under direct ultrasound guidance, a 21 gauge needle was advanced into the right internal jugular vein with ultrasound image documentation performed. After securing access with a micropuncture dilator, a guidewire was advanced into the inferior vena cava. A deployment sheath was advanced over the guidewire. This was utilized to perform IVC venography. The deployment sheath was further positioned in an appropriate location for filter deployment. A Bard Denali IVC filter was then advanced in the sheath. This was then fully deployed in the infrarenal IVC. Final filter position was confirmed with a fluoroscopic spot image. After the procedure the sheath was removed and hemostasis obtained with manual compression. COMPLICATIONS: None. FINDINGS: IVC venography demonstrates a normal caliber IVC with no evidence of thrombus. Renal veins are identified bilaterally. The IVC filter was successfully positioned below the level of the renal veins and is appropriately oriented. This IVC filter has both permanent and retrievable indications. IMPRESSION: Placement of percutaneous IVC filter in infrarenal IVC. IVC venogram shows no evidence of IVC thrombus and normal caliber of the inferior vena cava. This filter does have both permanent and retrievable indications. Electronically Signed   By: Aletta Edouard M.D.   On: 08/20/2015 17:42   Nm Pulmonary Perf And Vent  08/19/2015  CLINICAL DATA:  Lower extremity edema. Chest pain and shortness of  breath. EXAM: NUCLEAR MEDICINE VENTILATION - PERFUSION LUNG SCAN TECHNIQUE: Ventilation images were obtained in multiple projections using inhaled aerosol Tc-38m DTPA. Perfusion images were obtained in multiple projections after intravenous injection of Tc-39m MAA. RADIOPHARMACEUTICALS:  31.7 Technetium-27m DTPA aerosol inhalation and 4.1 Technetium-65m MAA IV COMPARISON:  08/19/2015 FINDINGS: Ventilation: Mild ventilation defect in the apical segment right upper lobe. Perfusion: Wedge-shaped large left upper lobe peripheral perfusion defect. Large apical segment right upper lobe matched perfusion defect. Chest radiograph normal in both regions. IMPRESSION: 1. Intermediate probability of pulmonary embolus by PIOPED 2 criteria (20 - 79% risk of pulmonary embolus), with 1 large mismatched perfusion defect in the left upper lobe, and a matched large right upper lobe perfusion and ventilation defect. Electronically Signed   By: Van Clines M.D.   On: 08/19/2015 12:32    Micro Results      Recent Results (from the past 240 hour(s))  Urine culture     Status: None   Collection Time: 08/19/15  1:28 AM  Result Value Ref Range Status   Specimen Description URINE, CLEAN CATCH  Final   Special Requests NONE  Final   Culture   Final    MULTIPLE SPECIES PRESENT, SUGGEST RECOLLECTION Performed at [facility]    Report Status 08/20/2015 FINAL  Final  Culture, blood (Routine X 2) w Reflex to ID Panel     Status: None (Preliminary result)   Collection Time:  08/19/15  4:00 AM  Result Value Ref Range Status   Specimen Description BLOOD LEFT HAND  Final   Special Requests IN PEDIATRIC BOTTLE 2CC  Final   Culture   Final    NO GROWTH 4 DAYS Performed at [facility]    Report Status PENDING  Incomplete  Culture, blood (Routine X 2) w Reflex to ID Panel     Status: None (Preliminary result)   Collection Time: 08/19/15  4:18 AM  Result Value Ref Range Status   Specimen Description BLOOD  RIGHT HAND  Final   Special Requests BOTTLES DRAWN AEROBIC ONLY 5CC  Final   Culture   Final    NO GROWTH 4 DAYS Performed at [facility]    Report Status PENDING  Incomplete  Culture, blood (routine x 2)     Status: None (Preliminary result)   Collection Time: 08/19/15  4:29 PM  Result Value Ref Range Status   Specimen Description RIGHT ANTECUBITAL  Final   Special Requests BOTTLES DRAWN AEROBIC AND ANAEROBIC 5CC  Final   Culture   Final    NO GROWTH 4 DAYS Performed at [facility]    Report Status PENDING  Incomplete  Culture, blood (routine x 2)     Status: None (Preliminary result)   Collection Time: 08/19/15  4:45 PM  Result Value Ref Range Status   Specimen Description BLOOD LEFT HAND  Final   Special Requests BOTTLES DRAWN AEROBIC AND ANAEROBIC 5CC  Final   Culture   Final    NO GROWTH 4 DAYS Performed at [facility]    Report Status PENDING  Incomplete  MRSA PCR Screening     Status: None   Collection Time: 08/19/15  8:33 PM  Result Value Ref Range Status   MRSA by PCR NEGATIVE NEGATIVE Final    Comment:        The GeneXpert MRSA Assay (FDA approved for NASAL specimens only), is one component of a comprehensive MRSA colonization surveillance program. It is not intended to diagnose MRSA infection nor to guide or monitor treatment for MRSA infections.        Today   Subjective    Kimberly Watson today has no headache,no chest abdominal pain,no new weakness tingling or numbness, feels much better wants to go home today.     Objective   Blood pressure 149/86, pulse 76, temperature 98.9 F (37.2 C), temperature source Oral, resp. rate 18, height 5\' 2"  (1.575 m), weight 83.961 kg (185 lb 1.6 oz), SpO2 97 %.   Intake/Output Summary (Last 24 hours) at 08/24/15 1129 Last data filed at 08/24/15 0657  Gross per 24 hour  Intake  891.4 ml  Output    252 ml  Net  639.4 ml    Exam Awake Alert, Oriented x 3, No new F.N deficits,  Normal affect [place].AT,PERRAL Supple Neck,No JVD, No cervical lymphadenopathy appriciated.  Symmetrical Chest wall movement, Good air movement bilaterally, CTAB RRR,No Gallops,Rubs or new Murmurs, No Parasternal Heave +ve B.Sounds, Abd Soft, Non tender, No organomegaly appriciated, No rebound -guarding or rigidity. No Cyanosis, Clubbing or edema, No new Rash or bruise   Data Review   CBC w Diff: Lab Results  Component Value Date   WBC 7.8 08/24/2015   HGB 12.3 08/24/2015   HCT 38.8 08/24/2015   PLT 219 08/24/2015   LYMPHOPCT 32 07/13/2015   MONOPCT 9 07/13/2015   EOSPCT 5 07/13/2015   BASOPCT 1 07/13/2015  CMP: Lab Results  Component Value Date   NA 141 08/23/2015   K 3.9 08/23/2015   CL 114* 08/23/2015   CO2 19* 08/23/2015   BUN 11 08/23/2015   CREATININE 0.85 08/23/2015   CREATININE 0.85 07/13/2015   PROT 4.9* 08/23/2015   ALBUMIN 2.6* 08/23/2015   BILITOT 0.5 08/23/2015   ALKPHOS 60 08/23/2015   AST 26 08/23/2015   ALT 64* 08/23/2015  .   Total Time in preparing paper work, data evaluation and todays exam - 35 minutes  Thurnell Lose M.D on 08/24/2015 at 11:29 AM  [facility]   Office  [id number]

## 2015-08-24 NOTE — Discharge Instructions (Addendum)
Information on my medicine - ELIQUIS (apixaban)  This medication education was reviewed with me or my healthcare representative as part of my discharge preparation.  The pharmacist that spoke with me during my hospital stay was:  Bajbus, Lauren, RPH  Why was Eliquis prescribed for you? Eliquis was prescribed to treat blood clots that may have been found in the veins of your legs (deep vein thrombosis) or in your lungs (pulmonary embolism) and to reduce the risk of them occurring again.  What do You need to know about Eliquis ? The starting dose is 10 mg (two 5 mg tablets) taken TWICE daily for the FIRST SEVEN (7) DAYS, then on  08/31/2015  the dose is reduced to ONE 5 mg tablet taken TWICE daily.  Eliquis may be taken with or without food.   Try to take the dose about the same time in the morning and in the evening. If you have difficulty swallowing the tablet whole please discuss with your pharmacist how to take the medication safely.  Take Eliquis exactly as prescribed and DO NOT stop taking Eliquis without talking to the doctor who prescribed the medication.  Stopping may increase your risk of developing a new blood clot.  Refill your prescription before you run out.  After discharge, you should have regular check-up appointments with your healthcare provider that is prescribing your Eliquis.    What do you do if you miss a dose? If a dose of ELIQUIS is not taken at the scheduled time, take it as soon as possible on the same day and twice-daily administration should be resumed. The dose should not be doubled to make up for a missed dose.  Important Safety Information A possible side effect of Eliquis is bleeding. You should call your healthcare provider right away if you experience any of the following: ? Bleeding from an injury or your nose that does not stop. ? Unusual colored urine (red or dark brown) or unusual colored stools (red or black). ? Unusual bruising for unknown  reasons. ? A serious fall or if you hit your head (even if there is no bleeding).  Some medicines may interact with Eliquis and might increase your risk of bleeding or clotting while on Eliquis. To help avoid this, consult your healthcare provider or pharmacist prior to using any new prescription or non-prescription medications, including herbals, vitamins, non-steroidal anti-inflammatory drugs (NSAIDs) and supplements.  This website has more information on Eliquis (apixaban): http://www.eliquis.com/eliquis/home   Follow with Primary MD MCKEOWN,WILLIAM DAVID, MD in 7 days   Get CBC, CMP, 2 view Chest X ray checked  by Primary MD next visit.    Activity: As tolerated with Full fall precautions use walker/cane & assistance as needed   Disposition Home     Diet:   Heart Healthy  Check your Weight same time everyday, if you gain over 2 pounds, or you develop in leg swelling, experience more shortness of breath or chest pain, call your Primary MD immediately. Follow Cardiac Low Salt Diet and 1.5 lit/day fluid restriction.   On your next visit with your primary care physician please Get Medicines reviewed and adjusted.   Please request your Prim.MD to go over all Hospital Tests and Procedure/Radiological results at the follow up, please get all Hospital records sent to your Prim MD by signing hospital release before you go home.   If you experience worsening of your admission symptoms, develop shortness of breath, life threatening emergency, suicidal or homicidal thoughts you must seek  medical attention immediately by calling 911 or calling your MD immediately  if symptoms less severe.  You Must read complete instructions/literature along with all the possible adverse reactions/side effects for all the Medicines you take and that have been prescribed to you. Take any new Medicines after you have completely understood and accpet all the possible adverse reactions/side effects.   Do not  drive, operating heavy machinery, perform activities at heights, swimming or participation in water activities or provide baby sitting services if your were admitted for syncope or siezures until you have seen by Primary MD or a Neurologist and advised to do so again.  Do not drive when taking Pain medications.    Do not take more than prescribed Pain, Sleep and Anxiety Medications  Special Instructions: If you have smoked or chewed Tobacco  in the last 2 yrs please stop smoking, stop any regular Alcohol  and or any Recreational drug use.  Wear Seat belts while driving.   Please note  You were cared for by a hospitalist during your hospital stay. If you have any questions about your discharge medications or the care you received while you were in the hospital after you are discharged, you can call the unit and asked to speak with the hospitalist on call if the hospitalist that took care of you is not available. Once you are discharged, your primary care physician will handle any further medical issues. Please note that NO REFILLS for any discharge medications will be authorized once you are discharged, as it is imperative that you return to your primary care physician (or establish a relationship with a primary care physician if you do not have one) for your aftercare needs so that they can reassess your need for medications and monitor your lab values.

## 2015-08-24 NOTE — Progress Notes (Signed)
Genia Harold to be D/C'd Home with [facility] per MD order.  Discussed with the patient and all questions fully answered.  VSS, Skin clean, dry and intact without evidence of skin break down, no evidence of skin tears noted. IV catheter discontinued intact. Site without signs and symptoms of complications. Dressing and pressure applied.  An After Visit Summary was printed and given to the patient. Patient received prescription.  D/c education completed with patient/family including follow up instructions, medication list, d/c activities limitations if indicated, with other d/c instructions as indicated by MD - patient able to verbalize understanding, all questions fully answered.   Patient instructed to return to ED, call 911, or call MD for any changes in condition.   Patient escorted via [place], and D/C home via private auto.  Malcolm Metro 08/24/2015 1:49 PM

## 2015-08-24 NOTE — Progress Notes (Signed)
Elqulis education kit has been ordered from pharmacy they are tubing it up for patient

## 2015-08-24 NOTE — Progress Notes (Addendum)
SATURATION QUALIFICATIONS: (This note is used to comply with regulatory documentation for home oxygen)  Patient Saturations on Room Air at Rest = 96%  Patient Saturations on Room Air while Ambulating = 90-93%  Patient Saturations on 0 Liters of oxygen while Ambulating = 93%  Please briefly explain why patient needs home oxygen:  No oxygen placed when walking.  Pt stated that she felt better after her lasix dose.  Dr Candiss Norse paged to be made aware

## 2015-08-24 NOTE — Progress Notes (Signed)
ANTICOAGULATION CONSULT NOTE - Follow Up Consult  Pharmacy Consult for heparin>> Eliquis Indication: DVT/PE/clot on tricuspid valve  Allergies  Allergen Reactions  . Evista [Raloxifene] Other (See Comments)    Blood clots - DVT and Pulmonary Embolism   . Fosamax [Alendronate Sodium]     esophagitis    Patient Measurements: Height: 5' 2"  (157.5 cm) Weight: 185 lb 1.6 oz (83.961 kg) IBW/kg (Calculated) : 50.1 Heparin Dosing Weight: 69 kg  Vital Signs: Temp: 98.9 F (37.2 C) (01/24 0533) Temp Source: Oral (01/24 0533) BP: 149/86 mmHg (01/24 0533) Pulse Rate: 76 (01/24 0533)  Labs:  Recent Labs  08/22/15 0218  08/22/15 2046 08/23/15 0256 08/24/15 0543  HGB 11.9*  --   --  11.6* 12.3  HCT 37.5  --   --  36.4 38.8  PLT 202  --   --  187 219  HEPARINUNFRC 0.27*  < > 0.40 0.40 0.43  CREATININE 0.89  --   --  0.85  --   < > = values in this interval not displayed.  Estimated Creatinine Clearance: 57.5 mL/min (by C-G formula based on Cr of 0.85).   Assessment: 75 y/o transfered from [facility] on heparin with DVT, saddle PE, and clot on tricuspid valve.  Patient met criteria for submassive PE (elevated trop, BNP, hemodynamic stable, heart strain) but EKOS was not done due to ECHO showing possible clot in transit on tricuspid valve. MD was concerned that EKOS woud dislodge clot causing another PE - s/p IVC filter 1/20  Per pulmonary, plan is for heparin for 5 full days- heparin to stop today at 1200 (noon), then to start Eliquis. Per notes, plan is for lifelong anticoagulation.  Hgb 12.3, plts 219- no bleeding noted. SCr 0.85, CrCl ~55-72m/min-stable.  Goal of Therapy:  Heparin level 0.3-0.7 units/ml Monitor platelets by anticoagulation protocol: Yes   Plan:  -Continue IV heparin at 1200 units/hr until noon -Starting at noon, give Eliquis 133mBID x7 days, then 46m56mID starting 1/31 -Monitor CBC, s/s of bleed -will provide education  Zelene Barga D. Laylee Schooley, PharmD, BCPS Clinical  Pharmacist Pager: [phone] 10:52 AM

## 2015-08-24 NOTE — Progress Notes (Signed)
Physical Therapy Treatment Patient Details Name: Kimberly Watson MRN: [id number] DOB: 1941/01/10 Today's Date: 08/24/2015    History of Present Illness 75 yo female admitted with troponin 0.20 oxygen saturation 88%, PE with RV strain and clot in transit on echo, extensive LLE DVT.  TTE scheduled for 08/23/15 PMH: HTN, GERD, IBS, Hiatal hernia, kidney stones    PT Comments    Patient progressing well with mobility during PT sessions. Patient able to ambulate 175 feet including with/without rw. Patient with loss of balance X1 with independent recovery when not using rw. Recommended use of device for now while at home for additional safety. SpO2 dropping to 85% during ambulation, nursing notified. Patient states she is anticipating that she will be going home soon and she denies any questions or concerns. PT to follow to progress mobility as tolerated.   Follow Up Recommendations  Home health PT;Supervision - Intermittent     Equipment Recommendations  Other (comment) (Patient states she has a rw at home. )    Recommendations for Other Services       Precautions / Restrictions Precautions Precautions: Fall Precaution Comments: watch oxygen Restrictions Weight Bearing Restrictions: No    Mobility  Bed Mobility               General bed mobility comments: not performed during session  Transfers Overall transfer level: Modified independent Equipment used:  (rail) Transfers: Sit to/from Stand Sit to Stand: Modified independent (Device/Increase time)         General transfer comment: performed from chair and toilet X2.   Ambulation/Gait Ambulation/Gait assistance: Supervision Ambulation Distance (Feet): 175 Feet Assistive device: Rolling walker (2 wheeled);None (100 ft no device, 75 feet using rw) Gait Pattern/deviations: Step-through pattern Gait velocity: decreased   General Gait Details: 1 loss of balance when ambulating without rw, independent recovery. Improved  stability with rw, recommended use of device at home for safety. Patient in agreement. Ambulating with/without rw. Patient ambulating X2 to bathroom.    Stairs            Wheelchair Mobility    Modified Rankin (Stroke Patients Only)       Balance Overall balance assessment: Independent           Standing balance-Leahy Scale: Fair                      Cognition Arousal/Alertness: Awake/alert Behavior During Therapy: WFL for tasks assessed/performed Overall Cognitive Status: Within Functional Limits for tasks assessed                      Exercises      General Comments General comments (skin integrity, edema, etc.): SpO2 - sitting 92-96%, while ambulating 92-85%. Standing break with cues for deep breathing when SpO2 dropping. Nursing notified.       Pertinent Vitals/Pain Pain Assessment: Faces Faces Pain Scale: Hurts a little bit Pain Location: neck Pain Descriptors / Indicators: Aching Pain Intervention(s): Limited activity within patient's tolerance;Monitored during session    Home Living                      Prior Function            PT Goals (current goals can now be found in the care plan section) Acute Rehab PT Goals Patient Stated Goal: to get back to being independent PT Goal Formulation: With patient Time For Goal Achievement: 08/30/15 Potential to Achieve Goals: Good Progress  towards PT goals: Progressing toward goals    Frequency  Min 3X/week    PT Plan Current plan remains appropriate    Co-evaluation             End of Session Equipment Utilized During Treatment: Gait belt Activity Tolerance: Patient tolerated treatment well Patient left: in chair;with call bell/phone within reach     Time: 1155-1219 PT Time Calculation (min) (ACUTE ONLY): 24 min  Charges:  $Gait Training: 23-37 mins                    G Codes:      Cassell Clement, PT, CSCS Pager [phone] Office [phone]  [phone]  08/24/2015, 1:17 PM

## 2015-08-24 NOTE — Progress Notes (Signed)
Eliquis: $47.00 for 30 day retail/ no auth required/ patient can use any retail pharmacy

## 2015-08-25 ENCOUNTER — Other Ambulatory Visit (HOSPITAL_COMMUNITY): Payer: Self-pay | Admitting: Interventional Radiology

## 2015-08-25 DIAGNOSIS — Z95828 Presence of other vascular implants and grafts: Secondary | ICD-10-CM

## 2015-08-26 ENCOUNTER — Ambulatory Visit (INDEPENDENT_AMBULATORY_CARE_PROVIDER_SITE_OTHER): Payer: Medicare Other | Admitting: Internal Medicine

## 2015-08-26 ENCOUNTER — Encounter: Payer: Self-pay | Admitting: Internal Medicine

## 2015-08-26 VITALS — BP 122/88 | HR 76 | Temp 97.8°F | Resp 16 | Ht 64.0 in | Wt 178.4 lb

## 2015-08-26 DIAGNOSIS — I2602 Saddle embolus of pulmonary artery with acute cor pulmonale: Secondary | ICD-10-CM | POA: Diagnosis not present

## 2015-08-26 DIAGNOSIS — I10 Essential (primary) hypertension: Secondary | ICD-10-CM

## 2015-08-26 DIAGNOSIS — I82402 Acute embolism and thrombosis of unspecified deep veins of left lower extremity: Secondary | ICD-10-CM

## 2015-08-26 NOTE — Progress Notes (Signed)
  Subjective:    Patient ID: Kimberly Watson, female    DOB: 06/08/1941, 75 y.o.   MRN: [id number]  HPI  This very nice 75 yo DWF presents for gf/u of recent hospitalization 1/18-1/24 with a LLE DVT and Saddle PE and was /is treated with Eliquis and episode was attributed to Raloxifene therapy of 20+ yrs duration and now d/c'd.  Since home patient is doing well with only occasional mild DOE. And denies and cough , CP or congestion.   Medication Sig  . ELIQUIS 5 MG TABS Take 2 tablets (10 mg total) by mouth 2 (two) times daily.  Marland Kitchen ELIQUIS 5 MG Take 1 tablet (5 mg total) by mouth 2 (two) times daily.  . OS-CAL 600 MG  Take 600 mg by mouth daily.  Marland Kitchen VITAMIN D 1000 UNITS Take 1,000 Units by mouth 1 day or 1 dose. Take 3 tablets daily.  . clobetasol ointment (TEMOVATE) 0.05 % Apply topically 2 (two) times daily.  . furosemide  40 MG tablet Take 1 tablet (40 mg total) by mouth daily.  Marland Kitchen MAGNESIUM Take 1 tablet by mouth daily.  . metoprolol succinate-XL 100 MG 24 hr  TAKE 1 TABLET (100 MG TOTAL) BY MOUTH DAILY. TAKE WITH OR IMMEDIATELY FOLLOWING A MEAL.  Marland Kitchen oxymetazoline (AFRIN) 0.05 % nasal spray Place 1 spray into both nostrils 2 (two) times daily.   Allergies  Allergen Reactions  . Evista [Raloxifene] Other (See Comments)    Blood clots - DVT and Pulmonary Embolism   . Fosamax [Alendronate Sodium]     esophagitis   Past Medical History  Diagnosis Date  . Hypertension   . Osteoporosis   . GERD (gastroesophageal reflux disease)   . Nephrolithiasis   . Cataract   . Elevated hemoglobin A1c   . Leiomyoma     adnexal Leiomyoma removal in 1/11  . H/O left knee surgery    Review of Systems 10 point systems review negative except as above.    Objective:   Physical Exam  BP 122/88 mmHg  Pulse 76  Temp(Src) 97.8 F (36.6 C)  Resp 16  Ht 5\' 4"  (1.626 m)  Wt 178 lb 6.4 oz (80.922 kg)  BMI 30.61 kg/m2  HEENT - Eac's patent. TM's Nl. EOM's full. PERRLA. NasoOroPharynx clear. Neck -  supple. Nl Thyroid. Carotids 2+ & No bruits, nodes, JVD Chest - Clear . Cor - Nl HS. RRR w/o sig MGR. PP 1(+). No edema. MS- FROM w/o deformities. Muscle power, tone and bulk Nl. Gait Nl. Neuro -  Nl w/o focal abnormalities. Psyche - Mental status normal & appropriate.  No delusions, ideations or obvious mood abnormalities.    Assessment & Plan:   1. Essential hypertension, controlled   2. Acute deep vein thrombosis (DVT) of left lower extremity, unspecified vein (HCC)   3. Acute saddle pulmonary embolism with acute cor pulmonale (HCC)   - Patient has scheduled f/u with Dr Melvyn Novas & Dr Alvy Bimler to determine treatment interval with Eliquis Over 20 minutes of exam, counseling, chart review and  complex critical decision making was performed

## 2015-09-14 ENCOUNTER — Encounter: Payer: Self-pay | Admitting: Internal Medicine

## 2015-09-14 ENCOUNTER — Ambulatory Visit (INDEPENDENT_AMBULATORY_CARE_PROVIDER_SITE_OTHER): Payer: Medicare Other | Admitting: Internal Medicine

## 2015-09-14 VITALS — BP 110/76 | HR 96 | Temp 98.0°F | Resp 18 | Ht 64.0 in | Wt 174.0 lb

## 2015-09-14 DIAGNOSIS — R609 Edema, unspecified: Secondary | ICD-10-CM | POA: Diagnosis not present

## 2015-09-14 DIAGNOSIS — T148 Other injury of unspecified body region: Secondary | ICD-10-CM | POA: Diagnosis not present

## 2015-09-14 DIAGNOSIS — W5503XA Scratched by cat, initial encounter: Secondary | ICD-10-CM

## 2015-09-14 NOTE — Progress Notes (Signed)
   Subjective:    Patient ID: Kimberly Watson, female    DOB: 08-03-1940, 75 y.o.   MRN: [id number]  HPI  Patient presents to the office for evaluation of left leg cat scratch which happened 5 days ago.  She reports that she is currently on eliquis and had some bleeding of the wound initially but it has stopped since then.  She has now had a lot of weeping of clear serous drainage of the wound.  She is having some swelling of the left leg.  She reports that she does have an IVC due to PE's.  No fevers, chills, nausea, and vomiting.  She reports that the cat scratch is not red that she is aware of.    Review of Systems  Constitutional: Negative for fever, chills and fatigue.  Cardiovascular: Positive for leg swelling.  Gastrointestinal: Negative for nausea and vomiting.  Skin: Positive for wound. Negative for color change.       Objective:   Physical Exam  Constitutional: She is oriented to person, place, and time. She appears well-developed and well-nourished. No distress.  HENT:  Head: Normocephalic.  Mouth/Throat: Oropharynx is clear and moist. No oropharyngeal exudate.  Eyes: Conjunctivae are normal. No scleral icterus.  Neck: Normal range of motion. Neck supple. No JVD present. No thyromegaly present.  Cardiovascular: Normal rate, regular rhythm, normal heart sounds and intact distal pulses.  Exam reveals no gallop and no friction rub.   No murmur heard. Pulmonary/Chest: Effort normal and breath sounds normal. No respiratory distress. She has no wheezes. She has no rales. She exhibits no tenderness.  Musculoskeletal: Normal range of motion.  Lymphadenopathy:    She has no cervical adenopathy.  Neurological: She is alert and oriented to person, place, and time.  Skin: She is not diaphoretic.     Psychiatric: She has a normal mood and affect. Her behavior is normal. Judgment and thought content normal.  Nursing note and vitals reviewed.   Filed Vitals:   09/14/15 1505  BP:  110/76  Pulse: 96  Temp: 98 F (36.7 C)  Resp: 18          Assessment & Plan:    1. Cat scratch -not infected -changed dressing -weeping serous fluid secondary to leg swelling from recent DVT -iodine sugar slurry  2. Peripheral edema -elevate legs above heart -avoid salt -cont eliquis

## 2015-09-16 ENCOUNTER — Encounter: Payer: Self-pay | Admitting: Internal Medicine

## 2015-09-16 ENCOUNTER — Ambulatory Visit (INDEPENDENT_AMBULATORY_CARE_PROVIDER_SITE_OTHER): Payer: Medicare Other | Admitting: Internal Medicine

## 2015-09-16 ENCOUNTER — Telehealth: Payer: Self-pay | Admitting: *Deleted

## 2015-09-16 ENCOUNTER — Other Ambulatory Visit (INDEPENDENT_AMBULATORY_CARE_PROVIDER_SITE_OTHER): Payer: Medicare Other

## 2015-09-16 ENCOUNTER — Inpatient Hospital Stay: Payer: Medicare Other | Admitting: Internal Medicine

## 2015-09-16 ENCOUNTER — Other Ambulatory Visit: Payer: Self-pay | Admitting: *Deleted

## 2015-09-16 VITALS — BP 124/80 | HR 81 | Ht 62.0 in | Wt 173.2 lb

## 2015-09-16 DIAGNOSIS — I2602 Saddle embolus of pulmonary artery with acute cor pulmonale: Secondary | ICD-10-CM

## 2015-09-16 LAB — CBC WITH DIFFERENTIAL/PLATELET
Basophils Absolute: 0.1 10*3/uL (ref 0.0–0.1)
Basophils Relative: 0.9 % (ref 0.0–3.0)
Eosinophils Absolute: 0.5 10*3/uL (ref 0.0–0.7)
Eosinophils Relative: 5.6 % — ABNORMAL HIGH (ref 0.0–5.0)
HCT: 45.9 % (ref 36.0–46.0)
Hemoglobin: 15 g/dL (ref 12.0–15.0)
Lymphocytes Relative: 32.5 % (ref 12.0–46.0)
Lymphs Abs: 2.6 10*3/uL (ref 0.7–4.0)
MCHC: 32.7 g/dL (ref 30.0–36.0)
MCV: 81.4 fl (ref 78.0–100.0)
Monocytes Absolute: 0.7 10*3/uL (ref 0.1–1.0)
Monocytes Relative: 8.6 % (ref 3.0–12.0)
Neutro Abs: 4.2 10*3/uL (ref 1.4–7.7)
Neutrophils Relative %: 52.4 % (ref 43.0–77.0)
Platelets: 341 10*3/uL (ref 150.0–400.0)
RBC: 5.64 Mil/uL — ABNORMAL HIGH (ref 3.87–5.11)
RDW: 15.6 % — ABNORMAL HIGH (ref 11.5–15.5)
WBC: 8 10*3/uL (ref 4.0–10.5)

## 2015-09-16 LAB — BASIC METABOLIC PANEL
BUN: 20 mg/dL (ref 6–23)
CO2: 30 mEq/L (ref 19–32)
Calcium: 10.2 mg/dL (ref 8.4–10.5)
Chloride: 104 mEq/L (ref 96–112)
Creatinine, Ser: 1.03 mg/dL (ref 0.40–1.20)
GFR: 55.45 mL/min — ABNORMAL LOW (ref >60.00)
Glucose, Bld: 107 mg/dL — ABNORMAL HIGH (ref 70–99)
Potassium: 3.7 mEq/L (ref 3.5–5.1)
Sodium: 142 mEq/L (ref 135–145)

## 2015-09-16 MED ORDER — APIXABAN 5 MG PO TABS: 5.0000 mg | ORAL_TABLET | Freq: Two times a day (BID) | ORAL | Status: DC

## 2015-09-16 NOTE — Telephone Encounter (Signed)
Pt left VM states needs refill on "blood thinner" and can't find prescribing MD,  Dr. Candiss Norse.  Called pt back and LVM informing that Dr. Candiss Norse was the hospitalist.  Hospital D/C note indicates pt should f/u w/ PCP to regarding medications and refills.  Pt doesn't see Dr. Alvy Bimler until 3/27.  Suggested pt call Dr. Melford Aase for refill on Eliquis.  Call us back if pt needs to see Dr. Alvy Bimler sooner or any other questions.

## 2015-09-16 NOTE — Progress Notes (Signed)
Subjective:     Patient ID: Kimberly Watson, female   DOB: February 22, 1941      MRN: [id number]  HPI  29 yowf never smoker from [place] s/p admit  Admission date: 08/18/2015    Discharge Date: 08/24/2015    Needs close outpatient follow-up with IR for IVC filter removal in 2-3 months. Also follow with [facility] hematology one time post discharge.     Admission Diagnosis Dizziness [R42] Leg edema [R60.0] Pulmonary embolus (HCC) [I26.99] Acute congestive heart failure, unspecified congestive heart failure type ([place]) [I50.9]   Discharge Diagnosis Dizziness [R42] Leg edema [R60.0] Pulmonary embolus (HCC) [I26.99] Acute congestive heart failure, unspecified congestive heart failure type ([place]) [I50.9]   Principal Problem:  Acute respiratory failure with hypoxia (HCC) Active Problems:  Essential hypertension  Mixed hyperlipidemia  GERD (gastroesophageal reflux disease)  Osteoporosis  Morbid obesity ([place])  Shortness of breath  Dizziness  Chest pressure  Elevated troponin  UTI (lower urinary tract infection)  Generalized weakness  AKI (acute kidney injury) ([place])  Diarrhea from food ingestion bad.  Tachypnea with excertion  Acute respiratory distress (HCC)  DVT (deep venous thrombosis) ([place])  Acute saddle pulmonary embolism with acute cor pulmonale (HCC)    Past Medical History  Diagnosis Date  . Hypertension   . Osteoporosis   . GERD (gastroesophageal reflux disease)   . Nephrolithiasis   . Cataract   . Elevated hemoglobin A1c   . Leiomyoma     adnexal Leiomyoma removal in 1/11  . H/O left knee surgery     Past Surgical History  Procedure Laterality Date  . Joint replacement Left total knee  . Uterine fibroid surgery    . Cholecystectomy        HPI from the history and physical done on the day of admission:   75 y.o. female with Hx of HTN, hyperlipidemia, GERD, IBS,  hiatal hernia, and kidney stone who presented with shortness of breath, dizziness, bilateral leg edema, and chest pressure.  In the ED the patient was found to have elevated troponin 0.20, oxygen desaturated to 88% on room air, and AKI. Chest x-ray was negative. D-dimer was elevated.     Hospital Course:    Pulmonary embolism with RV Strain and clot in transit on echo - Extensie LLE DVT -due tosedentary life style + EVISTA + obesity   -PESI 3 (hypxoemia needing 5L [place] and age 75) but with high trop and lacate at admit with PASP 53. She was admitted by pulmonary critical care and started on IV heparin drip on 08/19/2015 for 5 days. Thereafter she will be switched to Eliquis today. Also had evidence of left lower extremity DVT. Due to her clot burden patient underwent IVC filter placement while she was under the care of pulmonary critical care. IVC filter should be removed in 2-3 months.  She is currently symptom free and oxygen free, eager to go home, she will follow with pulmonary, IR for IVC filter removal and one time with hematology for any hypercoagulable workup if needed. Will be discharged on request.   SIRS  Due to PE - NO EVIDENCE OF INFECTION / SEPSIS  Possible mass on tricuspid valve Felt to have been a clot in transit - repeat TTE 1/23 noted no evidence of previously noted mass/clot. Discussed with Dr. Stanford Breed cardiologist on 08/24/2015. No further workup or treatment. Patient denies any history of fever or chills or anything suggestive of endocarditis.  Transaminitis  Resolving , repeat CMP in a week.  HTN BP reasonably controlled   AKI - resolved  Hx of GERD and IBS - continue home regimen.  ?UTI No evidence to support a diagnosis of UTI - UA abnorm, but high count of epis - stopped abx - follow clinically        09/16/2015 1st [facility] office visit/ Krish Bailly   Chief Complaint  Patient presents with  . Follow-up    Admitted to hospital 08/18/15  for pulmonary embo and dvt. Pt states that she is not 76 percent yet but doing well since discharge.   R knee slows her down,  L shin cat scratch healing  - Not limited by breathing from desired activities    No obvious day to day or daytime variability or assoc chronic cough or cp or chest tightness, subjective wheeze or overt sinus or hb symptoms. No unusual exp hx or h/o childhood pna/ asthma or knowledge of premature birth.  Sleeping ok without nocturnal  or early am exacerbation  of respiratory  c/o's or need for noct saba. Also denies any obvious fluctuation of symptoms with weather or environmental changes or other aggravating or alleviating factors except as outlined above   Current Medications, Allergies, Complete Past Medical History, Past Surgical History, Family History, and Social History were reviewed in [employer] record.  ROS  The following are not active complaints unless bolded sore throat, dysphagia, dental problems, itching, sneezing,  nasal congestion or excess/ purulent secretions, ear ache,   fever, chills, sweats, unintended wt loss, classically pleuritic or exertional cp, hemoptysis,  orthopnea pnd or leg swelling, presyncope, palpitations, abdominal pain, anorexia, nausea, vomiting, diarrhea  or change in bowel or bladder habits, change in stools or urine, dysuria,hematuria,  rash, arthralgias, visual complaints, headache, numbness, weakness or ataxia or problems with walking or coordination,  change in mood/affect or memory.       Review of Systems     Objective:   Physical Exam amb wf nad  Wt Readings from Last 3 Encounters:  09/16/15 173 lb 3.2 oz (78.563 kg)  09/14/15 174 lb (78.926 kg)  08/26/15 178 lb 6.4 oz (80.922 kg)    Vital signs reviewed   HEENT: nl dentition, turbinates, and oropharynx. Nl external ear canals without cough reflex   NECK :  without JVD/Nodes/TM/ nl carotid upstrokes bilaterally   LUNGS: no acc muscle use,   Nl contour chest which is clear to A and P bilaterally without cough on insp or exp maneuvers   CV:  RRR  no s3 or murmur or increase in P2,  Pitting 1+ on L/ trace on R LE  ABD:  soft and nontender with nl inspiratory excursion in the supine position. No bruits or organomegaly, bowel sounds nl  MS:  Nl gait/ ext warm without deformities, calf tenderness, cyanosis or clubbing No obvious joint restrictions   SKIN: warm and dry  - dime sized excoriated anular lesion L shin   NEURO:  alert, approp, nl sensorium with  no motor deficits    . Labs ordered/ reviewed:      Chemistry      Component Value Date/Time   NA 142 09/16/2015 1506   K 3.7 09/16/2015 1506   CL 104 09/16/2015 1506   CO2 30 09/16/2015 1506   BUN 20 09/16/2015 1506   CREATININE 1.03 09/16/2015 1506   CREATININE 0.85 07/13/2015 1129      Component Value Date/Time   CALCIUM 10.2 09/16/2015 1506   ALKPHOS 60 08/23/2015 0256  AST 26 08/23/2015 0256   ALT 64* 08/23/2015 0256   BILITOT 0.5 08/23/2015 0256        Lab Results  Component Value Date   WBC 8.0 09/16/2015   HGB 15.0 09/16/2015   HCT 45.9 09/16/2015   MCV 81.4 09/16/2015   PLT 341.0 09/16/2015      Lab Results  Component Value Date   TSH 1.995 08/19/2015               Assessment:

## 2015-09-16 NOTE — Patient Instructions (Addendum)
Please remember to go to the lab department downstairs for your tests - we will call you with the results when they are available.  If the bleeding gets a lot worse you will need to hold your eliquis and we may need to delay (strech out ) your filter removal if there a chance of having to stop your blood thinner to address any bleeding problems   Soak in tub as much as you can with epsom salts / follow up with DR Lawanna Kobus and hematology as planned

## 2015-09-16 NOTE — Progress Notes (Signed)
Quick Note:  Spoke with pt and notified of results per Dr. Wert. Pt verbalized understanding and denied any questions.  ______ 

## 2015-09-19 NOTE — Assessment & Plan Note (Addendum)
Venous u/s 08/19/15 Findings consistent with acute deep vein thrombosis involving the  left femoral vein, left popliteal vein, left posterial tibial  vein, and left peroneal vein. - Echo 08/23/15 mild RVE with PAS 45  Recovering as expected with eliquis rx per primary care for hopefully a min of 6 months then repeat echo and venous studies to be sure they have normalized before considering discontinuation - would be happy to coordinate that here if requested  The low grade hem bleeding is worrisome but apparently min as hgb is fine and for now rec epson salts/ soaks and hold off toward the end of the interval if feasible before removing IVC filter just in case the rectal bleeding gets worse or she needs any kind of surgery  I had an extended discussion with the patient reviewing all relevant studies completed to date and  lasting 15 to 20 minutes of a 25 minute visit    Each maintenance medication was reviewed in detail including most importantly the difference between maintenance and prns and under what circumstances the prns are to be triggered using an action plan format that is not reflected in the computer generated alphabetically organized AVS.    Please see instructions for details which were reviewed in writing and the patient given a copy highlighting the part that I personally wrote and discussed at today's ov.

## 2015-10-05 ENCOUNTER — Other Ambulatory Visit: Payer: Self-pay | Admitting: *Deleted

## 2015-10-05 MED ORDER — FUROSEMIDE 40 MG PO TABS: 40.0000 mg | ORAL_TABLET | Freq: Every day | ORAL | Status: DC

## 2015-10-14 ENCOUNTER — Ambulatory Visit
Admission: RE | Admit: 2015-10-14 | Discharge: 2015-10-14 | Disposition: A | Discharge status: Home / Self Care | Payer: Medicare Other | Source: Ambulatory Visit | Attending: Interventional Radiology | Admitting: Interventional Radiology

## 2015-10-14 DIAGNOSIS — Z95828 Presence of other vascular implants and grafts: Secondary | ICD-10-CM

## 2015-10-14 DIAGNOSIS — I82409 Acute embolism and thrombosis of unspecified deep veins of unspecified lower extremity: Secondary | ICD-10-CM | POA: Diagnosis not present

## 2015-10-14 DIAGNOSIS — I82402 Acute embolism and thrombosis of unspecified deep veins of left lower extremity: Secondary | ICD-10-CM | POA: Diagnosis not present

## 2015-10-14 NOTE — Progress Notes (Signed)
Chief Complaint: Status post IVC filter placement for acute saddle pulmonary embolism and left lower extremity DVT.  History of Present Illness: Kimberly Watson is a 75 y.o. female status post submassive bilateral saddle pulmonary embolism on 08/19/2015 with associated right heart strain. It was elected at that time not to pursue pulmonary arterial thrombolytic therapy and an IVC filter was placed on 08/20/2015. Since discharge from the hospital, the patient has made a nice recovery and is now asymptomatic and completely ambulatory and independent in activities of daily living. She is on 5 mg of Eliquis twice daily. At the time of pulmonary embolism, duplex ultrasound demonstrated extensive residual DVT in the left lower extremity. She now returns for assessment of DVT resolution and to discuss potential removal of the indwelling retrievable IVC filter.  She was taken off of Evista in the hospital, which was felt to potentially be a risk factor for the development of DVT.  Past Medical History  Diagnosis Date  . Hypertension   . Osteoporosis   . GERD (gastroesophageal reflux disease)   . Nephrolithiasis   . Cataract   . Elevated hemoglobin A1c   . Leiomyoma     adnexal Leiomyoma removal in 1/11  . H/O left knee surgery     Past Surgical History  Procedure Laterality Date  . Joint replacement Left total knee  . Uterine fibroid surgery    . Cholecystectomy      Allergies: Evista and Fosamax  Medications: Prior to Admission medications   Medication Sig Start Date End Date Taking? Authorizing Provider  apixaban (ELIQUIS) 5 MG TABS tablet Take 1 tablet (5 mg total) by mouth 2 (two) times daily. 09/16/15  Yes Unk Pinto, MD  calcium carbonate (OS-CAL) 600 MG TABS tablet Take 600 mg by mouth daily.   Yes Historical Provider, MD  cholecalciferol (VITAMIN D) 1000 UNITS tablet Take 1,000 Units by mouth 1 day or 1 dose. Take 3 tablets daily.   Yes Historical Provider, MD    clobetasol ointment (TEMOVATE) 0.05 % Apply topically 2 (two) times daily. 07/13/15  Yes Unk Pinto, MD  furosemide (LASIX) 40 MG tablet Take 1 tablet (40 mg total) by mouth daily. 10/05/15  Yes Unk Pinto, MD  MAGNESIUM PO Take 1 tablet by mouth daily.   Yes Historical Provider, MD  metoprolol succinate (TOPROL-XL) 100 MG 24 hr tablet TAKE 1 TABLET (100 MG TOTAL) BY MOUTH DAILY. TAKE WITH OR IMMEDIATELY FOLLOWING A MEAL. 07/04/15  Yes Unk Pinto, MD  nystatin (MYCOSTATIN/NYSTOP) 100000 UNIT/GM POWD Apply to affected area after a shower daily or as needed 03/25/14  Yes Vicie Mutters, PA-C  OVER THE COUNTER MEDICATION Place 1 spray into both nostrils daily.   Yes Historical Provider, MD     Family History  Problem Relation Age of Onset  . Hypertension Mother   . Diabetes Mother   . Breast cancer Mother   . Congestive Heart Failure Mother     Died age 18  . Osteoporosis Father     Social History   Social History  . Marital Status: Divorced    Spouse Name: N/A  . Number of Children: 2  . Years of Education: N/A   Occupational History  . Teacher     Retired   Social History Main Topics  . Smoking status: Never Smoker   . Smokeless tobacco: Never Used  . Alcohol Use: No  . Drug Use: No  . Sexual Activity: Not on file   Other Topics  Concern  . Not on file   Social History Narrative   Lives alone.  Lived with a smoker for years.     Review of Systems: A 12 point ROS discussed and pertinent positives are indicated in the HPI above.  All other systems are negative.  Review of Systems  Constitutional: Negative.   Respiratory: Negative.   Cardiovascular: Negative.   Gastrointestinal: Negative.   Genitourinary: Negative.   Musculoskeletal: Negative.   Neurological: Negative.   Hematological: Negative.      Vital Signs: BP 142/92 mmHg  Pulse 78  Temp(Src) 97.5 F (36.4 C) (Oral)  Resp 14  Ht 5\' 3"  (1.6 m)  Wt 175 lb (79.379 kg)  BMI 31.01 kg/m2  SpO2  97%  Physical Exam  Constitutional: She appears well-developed and well-nourished. No distress.  Abdominal: Soft. She exhibits no distension. There is no tenderness.  Musculoskeletal: She exhibits no edema.  Skin: She is not diaphoretic.    Imaging: US Venous Img Lower Unilateral Left  10/14/2015  CLINICAL DATA:  Follow-up after recent bilateral submassive pulmonary embolism after hospital discharge. Evaluation prior to possible IVC filter removal. Previous ultrasound demonstrated extensive left femoral, popliteal, posterior tibial and peroneal vein DVT. EXAM: LEFT LOWER EXTREMITY VENOUS DOPPLER ULTRASOUND TECHNIQUE: Gray-scale sonography with graded compression, as well as color Doppler and duplex ultrasound were performed to evaluate the lower extremity deep venous systems from the level of the common femoral vein and including the common femoral, femoral, profunda femoral, popliteal and calf veins including the posterior tibial, peroneal and gastrocnemius veins when visible. The superficial great saphenous vein was also interrogated. Spectral Doppler was utilized to evaluate flow at rest and with distal augmentation maneuvers in the common femoral, femoral and popliteal veins. COMPARISON:  Prior study on 08/19/2015 after the [facility]invasive [facility] Lab FINDINGS: Contralateral Common Femoral Vein: Respiratory phasicity is normal and symmetric with the symptomatic side. No evidence of thrombus. Normal compressibility. Common Femoral Vein: No evidence of thrombus. Normal compressibility, respiratory phasicity and response to augmentation. Saphenofemoral Junction: No evidence of thrombus. Normal compressibility and flow on color Doppler imaging. Profunda Femoral Vein: No evidence of thrombus. Normal compressibility and flow on color Doppler imaging. Femoral Vein: No evidence of thrombus. Normal compressibility, respiratory phasicity and response to augmentation. Popliteal Vein: No evidence of  thrombus. Normal compressibility, respiratory phasicity and response to augmentation. Calf Veins: No evidence of thrombus. Normal compressibility and flow on color Doppler imaging. Superficial Great Saphenous Vein: No evidence of thrombus. Normal compressibility and flow on color Doppler imaging. Venous Reflux:  None. Other Findings: No evidence of superficial thrombophlebitis or abnormal fluid collection. IMPRESSION: Complete recanalization and resolution of prior left lower extremity DVT with no residual thrombus or evidence of chronic mural thrombus. Electronically Signed   By: Aletta Edouard M.D.   On: 10/14/2015 16:23    Labs:  CBC:  Recent Labs  08/22/15 0218 08/23/15 0256 08/24/15 0543 09/16/15 1506  WBC 8.4 8.1 7.8 8.0  HGB 11.9* 11.6* 12.3 15.0  HCT 37.5 36.4 38.8 45.9  PLT 202 187 219 341.0    COAGS:  Recent Labs  08/19/15 0041  INR 1.26  APTT 28    BMP:  Recent Labs  08/20/15 0258 08/21/15 0310 08/22/15 0218 08/23/15 0256 09/16/15 1506  NA 143 141 142 141 142  K 3.8 3.7 3.9 3.9 3.7  CL 109 111 110 114* 104  CO2 23 21* 21* 19* 30  GLUCOSE 95 96 96 106* 107*  BUN 26*  21* 13 11 20   CALCIUM 8.8* 8.4* 8.7* 8.4* 10.2  CREATININE 1.16* 1.02* 0.89 0.85 1.03  GFRNONAA 45* 52* >60 >60  --   GFRAA 52* >60 >60 >60  --     LIVER FUNCTION TESTS:  Recent Labs  03/30/15 1101 07/13/15 1129 08/19/15 0041 08/23/15 0256  BILITOT 0.7 0.7 0.9 0.5  AST 16 19 200* 26  ALT 11 13 216* 64*  ALKPHOS 76 78 102 60  PROT 7.3 7.1 7.4 4.9*  ALBUMIN 4.0 4.1 4.0 2.6*    Assessment and Plan:  Kimberly Watson has made a good recovery after recent hospital recently for significant bilateral pulmonary embolism and left lower extremity DVT. A retrievable IVC filter was placed during hospitalization. Ultrasound of the left lower extremity veins was performed today demonstrating complete resolution of DVT and no residual acute or chronic thrombus.  The pros and cons of IVC filter  removal were discussed with the patient including technical details of how the filter is removed. The patient is scheduled to see Dr. Alvy Bimler for hematologic evaluation on 10/25/2015. She is following up with Dr. Melford Aase week. She is also scheduled to follow-up with Dr. Melvyn Novas for pulmonary evaluation. She currently feels that if the IVC filter can be removed, she would rather remove it in the near future. I recommended that we wait until we get some recommendations from Dr. Alvy Bimler after hematologic evaluation. The patient is agreeable. Should everyone agree that the filter can be removed, this can be arranged on an elective outpatient basis in the next few months.   Electronically SignedAletta Edouard T 10/14/2015, 5:04 PM   I spent a total of 15 Minutes in face to face in clinical consultation, greater than 50% of which was counseling/coordinating care for management of a retrievable IVC filter.

## 2015-10-15 ENCOUNTER — Ambulatory Visit: Payer: Self-pay | Admitting: Physician Assistant

## 2015-10-19 ENCOUNTER — Other Ambulatory Visit: Payer: Self-pay | Admitting: Internal Medicine

## 2015-10-21 ENCOUNTER — Encounter: Payer: Self-pay | Admitting: Physician Assistant

## 2015-10-21 ENCOUNTER — Ambulatory Visit (INDEPENDENT_AMBULATORY_CARE_PROVIDER_SITE_OTHER): Payer: Medicare Other | Admitting: Physician Assistant

## 2015-10-21 VITALS — BP 118/66 | HR 88 | Temp 97.7°F | Resp 16 | Ht 63.0 in | Wt 174.4 lb

## 2015-10-21 DIAGNOSIS — R350 Frequency of micturition: Secondary | ICD-10-CM

## 2015-10-21 DIAGNOSIS — R7303 Prediabetes: Secondary | ICD-10-CM | POA: Diagnosis not present

## 2015-10-21 DIAGNOSIS — E559 Vitamin D deficiency, unspecified: Secondary | ICD-10-CM | POA: Diagnosis not present

## 2015-10-21 DIAGNOSIS — I10 Essential (primary) hypertension: Secondary | ICD-10-CM | POA: Diagnosis not present

## 2015-10-21 DIAGNOSIS — E782 Mixed hyperlipidemia: Secondary | ICD-10-CM | POA: Diagnosis not present

## 2015-10-21 DIAGNOSIS — I2602 Saddle embolus of pulmonary artery with acute cor pulmonale: Secondary | ICD-10-CM | POA: Diagnosis not present

## 2015-10-21 DIAGNOSIS — Z79899 Other long term (current) drug therapy: Secondary | ICD-10-CM | POA: Diagnosis not present

## 2015-10-21 LAB — CBC WITH DIFFERENTIAL/PLATELET
Basophils Absolute: 0.1 10*3/uL (ref 0.0–0.1)
Basophils Relative: 1 % (ref 0–1)
Eosinophils Absolute: 0.4 10*3/uL (ref 0.0–0.7)
Eosinophils Relative: 4 % (ref 0–5)
HCT: 43.4 % (ref 36.0–46.0)
Hemoglobin: 14.5 g/dL (ref 12.0–15.0)
Lymphocytes Relative: 22 % (ref 12–46)
Lymphs Abs: 2.3 10*3/uL (ref 0.7–4.0)
MCH: 27.3 pg (ref 26.0–34.0)
MCHC: 33.4 g/dL (ref 30.0–36.0)
MCV: 81.6 fL (ref 78.0–100.0)
MPV: 10.3 fL (ref 8.6–12.4)
Monocytes Absolute: 0.9 10*3/uL (ref 0.1–1.0)
Monocytes Relative: 9 % (ref 3–12)
Neutro Abs: 6.7 10*3/uL (ref 1.7–7.7)
Neutrophils Relative %: 64 % (ref 43–77)
Platelets: 397 10*3/uL (ref 150–400)
RBC: 5.32 MIL/uL — ABNORMAL HIGH (ref 3.87–5.11)
RDW: 14.9 % (ref 11.5–15.5)
WBC: 10.5 10*3/uL (ref 4.0–10.5)

## 2015-10-21 NOTE — Progress Notes (Signed)
Assessment and Plan:  1. Hypertension -Continue medication, monitor blood pressure at home. Continue DASH diet.  Reminder to go to the ER if any CP, SOB, nausea, dizziness, severe HA, changes vision/speech, left arm numbness and tingling and jaw pain.  2. Cholesterol -Continue diet and exercise. Check cholesterol.   3. Prediabetes  -Continue diet and exercise. Check A1C  4. Vitamin D Def - check level and continue medications.   5. Recheck UTI Will recheck  6. PE Off evista, will do x 6 months, will repeat vascular studies in July and take her off of blood thinner at that time.   Continue diet and meds as discussed. Further disposition pending results of labs. Over 30 minutes of exam, counseling, chart review, and critical decision making was performed  HPI 75 y.o. female  presents for 3 month follow up on hypertension, cholesterol, prediabetes, and vitamin D deficiency.  Patient had SIRS and hypoxemia due to PE in Jan 2017, on eliquis. Had repeat US at Dr. Lisabeth Devoid and will have IVC filter removed, will be on eliquis x 6 months, will repeat US/echo and take off in July  Her blood pressure has been controlled at home, today their BP is BP: 118/66 mmHg  She does not workout. She denies chest pain, shortness of breath, dizziness.  She is on cholesterol medication and denies myalgias. Her cholesterol is at goal. The cholesterol last visit was:   Lab Results  Component Value Date   CHOL 146 08/19/2015   HDL 54 08/19/2015   LDLCALC 72 08/19/2015   TRIG 98 08/19/2015   CHOLHDL 2.7 08/19/2015    She has been working on diet and exercise for prediabetes, and denies paresthesia of the feet, polydipsia, polyuria and visual disturbances. Last A1C in the office was:  Lab Results  Component Value Date   HGBA1C 5.8* 08/19/2015   Patient is on Vitamin D supplement.   Lab Results  Component Value Date   VD25OH 46 07/13/2015     Continues to have right leg pain, daughter is getting  married late in Oct and she things after that she would like to get a knee replacement.   Current Medications:  Current Outpatient Prescriptions on File Prior to Visit  Medication Sig Dispense Refill  . calcium carbonate (OS-CAL) 600 MG TABS tablet Take 600 mg by mouth daily.    . cholecalciferol (VITAMIN D) 1000 UNITS tablet Take 1,000 Units by mouth 1 day or 1 dose. Take 3 tablets daily.    . clobetasol ointment (TEMOVATE) 0.05 % Apply topically 2 (two) times daily. 45 g 1  . furosemide (LASIX) 40 MG tablet Take 1 tablet (40 mg total) by mouth daily. 30 tablet 3  . MAGNESIUM PO Take 1 tablet by mouth daily.    . metoprolol succinate (TOPROL-XL) 100 MG 24 hr tablet TAKE 1 TABLET (100 MG TOTAL) BY MOUTH DAILY. TAKE WITH OR IMMEDIATELY FOLLOWING A MEAL. 90 tablet 1  . nystatin (MYCOSTATIN/NYSTOP) 100000 UNIT/GM POWD Apply to affected area after a shower daily or as needed 60 g 2  . OVER THE COUNTER MEDICATION Place 1 spray into both nostrils daily.     No current facility-administered medications on file prior to visit.   Medical History:  Past Medical History  Diagnosis Date  . Hypertension   . Osteoporosis   . GERD (gastroesophageal reflux disease)   . Nephrolithiasis   . Cataract   . Elevated hemoglobin A1c   . Leiomyoma     adnexal Leiomyoma  removal in 1/11  . H/O left knee surgery    Allergies:  Allergies  Allergen Reactions  . Evista [Raloxifene] Other (See Comments)    Blood clots - DVT and Pulmonary Embolism   . Fosamax [Alendronate Sodium]     esophagitis     Review of Systems:  Review of Systems  Constitutional: Negative.   HENT: Positive for congestion.   Eyes: Negative.   Respiratory: Negative.   Cardiovascular: Negative.   Gastrointestinal: Negative.   Genitourinary: Negative.   Musculoskeletal: Positive for joint pain (right knee). Negative for myalgias, back pain, falls and neck pain.  Skin: Negative.   Neurological: Negative.   Endo/Heme/Allergies:  Negative.   Psychiatric/Behavioral: Negative.     Family history- Review and unchanged Social history- Review and unchanged Physical Exam: BP 118/66 mmHg  Pulse 88  Temp(Src) 97.7 F (36.5 C) (Temporal)  Resp 16  Ht 5\' 3"  (1.6 m)  Wt 174 lb 6.4 oz (79.107 kg)  BMI 30.90 kg/m2 Wt Readings from Last 3 Encounters:  10/21/15 174 lb 6.4 oz (79.107 kg)  10/14/15 175 lb (79.379 kg)  09/16/15 173 lb 3.2 oz (78.563 kg)   General Appearance: Well nourished, in no apparent distress. Eyes: PERRLA, EOMs, conjunctiva no swelling or erythema Sinuses: No Frontal/maxillary tenderness ENT/Mouth: Ext aud canals clear, TMs without erythema, bulging. No erythema, swelling, or exudate on post pharynx.  Tonsils not swollen or erythematous. Hearing normal.  Neck: Supple, thyroid normal.  Respiratory: Respiratory effort normal, BS equal bilaterally without rales, rhonchi, wheezing or stridor.  Cardio: RRR with no MRGs. Brisk peripheral pulses without edema.  Abdomen: Soft, + BS,  Non tender, no guarding, rebound, hernias, masses. Lymphatics: Non tender without lymphadenopathy.  Musculoskeletal: Full ROM, 5/5 strength, Normal gait Skin: Warm, dry without rashes, lesions, ecchymosis.  Neuro: Cranial nerves intact. Normal muscle tone, no cerebellar symptoms. Psych: Awake and oriented X 3, normal affect, Insight and Judgment appropriate.    Vicie Mutters, PA-C 3:34 PM [facility]

## 2015-10-22 LAB — URINALYSIS, ROUTINE W REFLEX MICROSCOPIC
Bilirubin Urine: NEGATIVE
Glucose, UA: NEGATIVE
Hgb urine dipstick: NEGATIVE
Ketones, ur: NEGATIVE
Leukocytes, UA: NEGATIVE
Nitrite: NEGATIVE
Protein, ur: NEGATIVE
Specific Gravity, Urine: 1.015 (ref 1.001–1.035)
pH: 5 (ref 5.0–8.0)

## 2015-10-22 LAB — BASIC METABOLIC PANEL WITH GFR
BUN: 22 mg/dL (ref 7–25)
CO2: 26 mmol/L (ref 20–31)
Calcium: 9.5 mg/dL (ref 8.6–10.4)
Chloride: 102 mmol/L (ref 98–110)
Creat: 0.91 mg/dL (ref 0.60–0.93)
GFR, Est African American: 71 mL/min (ref >=60)
GFR, Est Non African American: 62 mL/min (ref >=60)
Glucose, Bld: 88 mg/dL (ref 65–99)
Potassium: 4 mmol/L (ref 3.5–5.3)
Sodium: 140 mmol/L (ref 135–146)

## 2015-10-22 LAB — HEPATIC FUNCTION PANEL
ALT: 18 U/L (ref 6–29)
AST: 22 U/L (ref 10–35)
Albumin: 3.8 g/dL (ref 3.6–5.1)
Alkaline Phosphatase: 88 U/L (ref 33–130)
Bilirubin, Direct: 0.2 mg/dL (ref <=0.2)
Indirect Bilirubin: 0.5 mg/dL (ref 0.2–1.2)
Total Bilirubin: 0.7 mg/dL (ref 0.2–1.2)
Total Protein: 6.9 g/dL (ref 6.1–8.1)

## 2015-10-22 LAB — MAGNESIUM: Magnesium: 1.9 mg/dL (ref 1.5–2.5)

## 2015-10-22 LAB — LIPID PANEL
Cholesterol: 142 mg/dL (ref 125–200)
HDL: 60 mg/dL (ref >=46)
LDL Cholesterol: 64 mg/dL (ref <130)
Total CHOL/HDL Ratio: 2.4 Ratio (ref <=5.0)
Triglycerides: 88 mg/dL (ref <150)
VLDL: 18 mg/dL (ref <30)

## 2015-10-22 LAB — TSH: TSH: 1.53 mIU/L

## 2015-10-23 ENCOUNTER — Emergency Department (HOSPITAL_COMMUNITY)
Admission: EM | Admit: 2015-10-23 | Discharge: 2015-10-23 | Disposition: A | Discharge status: Home / Self Care | Payer: Medicare Other | Attending: Emergency Medicine | Admitting: Emergency Medicine

## 2015-10-23 ENCOUNTER — Encounter (HOSPITAL_COMMUNITY): Payer: Self-pay | Admitting: Emergency Medicine

## 2015-10-23 DIAGNOSIS — R002 Palpitations: Secondary | ICD-10-CM

## 2015-10-23 DIAGNOSIS — Z8669 Personal history of other diseases of the nervous system and sense organs: Secondary | ICD-10-CM | POA: Insufficient documentation

## 2015-10-23 DIAGNOSIS — R404 Transient alteration of awareness: Secondary | ICD-10-CM | POA: Diagnosis not present

## 2015-10-23 DIAGNOSIS — Z87442 Personal history of urinary calculi: Secondary | ICD-10-CM | POA: Insufficient documentation

## 2015-10-23 DIAGNOSIS — K219 Gastro-esophageal reflux disease without esophagitis: Secondary | ICD-10-CM | POA: Insufficient documentation

## 2015-10-23 DIAGNOSIS — I10 Essential (primary) hypertension: Secondary | ICD-10-CM | POA: Insufficient documentation

## 2015-10-23 DIAGNOSIS — M810 Age-related osteoporosis without current pathological fracture: Secondary | ICD-10-CM | POA: Diagnosis not present

## 2015-10-23 DIAGNOSIS — R42 Dizziness and giddiness: Secondary | ICD-10-CM | POA: Diagnosis not present

## 2015-10-23 DIAGNOSIS — Z79899 Other long term (current) drug therapy: Secondary | ICD-10-CM | POA: Diagnosis not present

## 2015-10-23 DIAGNOSIS — Z86018 Personal history of other benign neoplasm: Secondary | ICD-10-CM | POA: Insufficient documentation

## 2015-10-23 DIAGNOSIS — R531 Weakness: Secondary | ICD-10-CM | POA: Diagnosis not present

## 2015-10-23 LAB — URINALYSIS, ROUTINE W REFLEX MICROSCOPIC
Bilirubin Urine: NEGATIVE
Glucose, UA: NEGATIVE mg/dL
Hgb urine dipstick: NEGATIVE
Ketones, ur: NEGATIVE mg/dL
Nitrite: NEGATIVE
Protein, ur: NEGATIVE mg/dL
Specific Gravity, Urine: 1.026 (ref 1.005–1.030)
pH: 5.5 (ref 5.0–8.0)

## 2015-10-23 LAB — CBC
HCT: 42.4 % (ref 36.0–46.0)
Hemoglobin: 13.6 g/dL (ref 12.0–15.0)
MCH: 26.5 pg (ref 26.0–34.0)
MCHC: 32.1 g/dL (ref 30.0–36.0)
MCV: 82.7 fL (ref 78.0–100.0)
Platelets: 373 10*3/uL (ref 150–400)
RBC: 5.13 MIL/uL — ABNORMAL HIGH (ref 3.87–5.11)
RDW: 14.4 % (ref 11.5–15.5)
WBC: 7.2 10*3/uL (ref 4.0–10.5)

## 2015-10-23 LAB — BASIC METABOLIC PANEL
Anion gap: 11 (ref 5–15)
BUN: 29 mg/dL — ABNORMAL HIGH (ref 6–20)
CO2: 24 mmol/L (ref 22–32)
Calcium: 9.6 mg/dL (ref 8.9–10.3)
Chloride: 111 mmol/L (ref 101–111)
Creatinine, Ser: 0.88 mg/dL (ref 0.44–1.00)
GFR calc Af Amer: >60 mL/min (ref >60)
GFR calc non Af Amer: >60 mL/min (ref >60)
Glucose, Bld: 114 mg/dL — ABNORMAL HIGH (ref 65–99)
Potassium: 3.7 mmol/L (ref 3.5–5.1)
Sodium: 146 mmol/L — ABNORMAL HIGH (ref 135–145)

## 2015-10-23 LAB — URINE CULTURE: Colony Count: 10000

## 2015-10-23 LAB — URINE MICROSCOPIC-ADD ON
RBC / HPF: NONE SEEN RBC/hpf (ref 0–5)
Squamous Epithelial / LPF: NONE SEEN

## 2015-10-23 LAB — I-STAT TROPONIN, ED: Troponin i, poc: 0 ng/mL (ref 0.00–0.08)

## 2015-10-23 LAB — CBG MONITORING, ED: Glucose-Capillary: 104 mg/dL — ABNORMAL HIGH (ref 65–99)

## 2015-10-23 NOTE — ED Provider Notes (Signed)
CSN: [id number]     Arrival date & time 10/23/15  0206 History   First MD Initiated Contact with Patient 10/23/15 [phone]     Chief Complaint  Patient presents with  . Anxiety  . Near Syncope  . Palpitations    HPI   Kimberly Watson is a 75 y.o. female with a PMH of HTN, GERD, IBS, PE on eliquis and s/p IVC filter who presents to the ED with palpitations. She states she woke up around 1 AM this morning and felt like her heart was racing. She reports she stood up out of bed, and felt lightheaded. She notes her symptoms lasted for around 30 minutes. She denies exacerbating or alleviating factors. She denies chest pain, shortness of breath, abdominal pain, N/V. She states she has a history of IBS, and alternates between diarrhea and constipation. She denies hematochezia, melena, numbness, weakness, paresthesia.   Past Medical History  Diagnosis Date  . Hypertension   . Osteoporosis   . GERD (gastroesophageal reflux disease)   . Nephrolithiasis   . Cataract   . Elevated hemoglobin A1c   . Leiomyoma     adnexal Leiomyoma removal in 1/11  . H/O left knee surgery    Past Surgical History  Procedure Laterality Date  . Joint replacement Left total knee  . Uterine fibroid surgery    . Cholecystectomy     Family History  Problem Relation Age of Onset  . Hypertension Mother   . Diabetes Mother   . Breast cancer Mother   . Congestive Heart Failure Mother     Died age 2  . Osteoporosis Father    Social History  Substance Use Topics  . Smoking status: Never Smoker   . Smokeless tobacco: Never Used  . Alcohol Use: No   OB History    No data available      Review of Systems  Constitutional: Negative for fever and chills.  Respiratory: Negative for shortness of breath.   Cardiovascular: Positive for palpitations. Negative for chest pain.  Gastrointestinal: Negative for nausea, vomiting, abdominal pain, diarrhea and constipation.  Neurological: Positive for light-headedness.  Negative for dizziness, syncope, weakness, numbness and headaches.  All other systems reviewed and are negative.     Allergies  Evista and Fosamax  Home Medications   Prior to Admission medications   Medication Sig Start Date End Date Taking? Authorizing Provider  calcium carbonate (OS-CAL) 600 MG TABS tablet Take 600 mg by mouth daily.   Yes Historical Provider, MD  cholecalciferol (VITAMIN D) 1000 UNITS tablet Take 8,000 Units by mouth daily.    Yes Historical Provider, MD  ELIQUIS 5 MG TABS tablet Take 5 mg by mouth 2 (two) times daily. 10/13/15  Yes Historical Provider, MD  furosemide (LASIX) 40 MG tablet Take 1 tablet (40 mg total) by mouth daily. 10/05/15  Yes Unk Pinto, MD  MAGNESIUM PO Take 1 tablet by mouth daily.   Yes Historical Provider, MD  metoprolol succinate (TOPROL-XL) 100 MG 24 hr tablet TAKE 1 TABLET (100 MG TOTAL) BY MOUTH DAILY. TAKE WITH OR IMMEDIATELY FOLLOWING A MEAL. 10/19/15  Yes Unk Pinto, MD  OVER THE COUNTER MEDICATION Place 1 spray into both nostrils daily as needed (congestion).    Yes Historical Provider, MD  clobetasol ointment (TEMOVATE) 0.05 % Apply topically 2 (two) times daily. Patient not taking: Reported on 10/23/2015 07/13/15   Unk Pinto, MD  nystatin (MYCOSTATIN/NYSTOP) 100000 UNIT/GM POWD Apply to affected area after a shower daily or  as needed Patient not taking: Reported on 10/23/2015 03/25/14   Vicie Mutters, PA-C    BP 141/86 mmHg  Pulse 98  Temp(Src) 98 F (36.7 C) (Oral)  Resp 19  SpO2 97% Physical Exam  Constitutional: She is oriented to person, place, and time. She appears well-developed and well-nourished. No distress.  HENT:  Head: Normocephalic and atraumatic.  Right Ear: External ear normal.  Left Ear: External ear normal.  Nose: Nose normal.  Mouth/Throat: Uvula is midline, oropharynx is clear and moist and mucous membranes are normal.  Eyes: Conjunctivae, EOM and lids are normal. Pupils are equal, round, and  reactive to light. Right eye exhibits no discharge. Left eye exhibits no discharge. No scleral icterus.  Neck: Normal range of motion. Neck supple.  Cardiovascular: Normal rate, regular rhythm, normal heart sounds, intact distal pulses and normal pulses.   Pulmonary/Chest: Effort normal and breath sounds normal. No respiratory distress. She has no wheezes. She has no rales.  Abdominal: Soft. Normal appearance and bowel sounds are normal. She exhibits no distension and no mass. There is no tenderness. There is no rigidity, no rebound and no guarding.  Musculoskeletal: Normal range of motion. She exhibits no edema or tenderness.  Neurological: She is alert and oriented to person, place, and time. She has normal strength. No cranial nerve deficit or sensory deficit.  Skin: Skin is warm, dry and intact. No rash noted. She is not diaphoretic. No erythema. No pallor.  Psychiatric: She has a normal mood and affect. Her speech is normal and behavior is normal.  Nursing note and vitals reviewed.   ED Course  Procedures (including critical care time)  Labs Review Labs Reviewed  BASIC METABOLIC PANEL - Abnormal; Notable for the following:    Sodium 146 (*)    Glucose, Bld 114 (*)    BUN 29 (*)    All other components within normal limits  CBC - Abnormal; Notable for the following:    RBC 5.13 (*)    All other components within normal limits  CBG MONITORING, ED - Abnormal; Notable for the following:    Glucose-Capillary 104 (*)    All other components within normal limits  URINALYSIS, ROUTINE W REFLEX MICROSCOPIC (NOT AT [facility])  I-STAT TROPOININ, ED    Imaging Review No results found.   I have personally reviewed and evaluated these lab results as part of my medical decision-making.   EKG Interpretation   Date/Time:  Saturday October 23 2015 02:59:19 EDT Ventricular Rate:  97 PR Interval:  198 QRS Duration: 98 QT Interval:  374 QTC Calculation: 475 R Axis:   -59 Text Interpretation:   Sinus rhythm Left anterior fascicular block Left  ventricular hypertrophy Probable anterior infarct, age indeterminate  Confirmed by KOHUT  MD, STEPHEN ([id number]) on 10/23/2015 6:51:33 AM      MDM   Final diagnoses:  Palpitations    75 year old female presents with palpitations. States she woke up tonight prior to arrival and stood up and felt lightheaded. Denies chest pain, shortness of breath. Patient is afebrile. Vital signs stable. Heart RRR. Lungs clear to auscultation bilaterally. Abdomen soft, non-tender, non-distended. Patient moves all extremities without difficulty. Normal neuro exam with no focal deficit.  EKG sinus rhythm, HR 97. Troponin negative. CBC negative for leukocytosis or anemia. BMP remarkable for sodium 146. UA with moderate leukocytes, 6-30 WBC, rare bacteria. No urinary symptoms, do not feel treatment is indicated at this time.  Patient discussed with and seen by Dr. Wilson Singer.  Patient denies complaints and has had no recurrence of symptoms in the ED. After speaking with patient further, she states she [place] caffeineated tea [place] [employer] late [place], which she normally does not do, and thinks this may have precipitated her symptoms. Patient to follow-up with PCP as scheduled on Monday. Strict return precautions discussed. Patient verbalizes her understanding and is in agreement with plan.  BP 141/86 mmHg  Pulse 98  Temp(Src) 98 F (36.7 C) (Oral)  Resp 19  SpO2 97%       Marella Chimes, PA-C 10/23/15 1542  Virgel Manifold, MD 10/26/15 1026

## 2015-10-23 NOTE — ED Notes (Signed)
Pt denies CP and SOB

## 2015-10-23 NOTE — Discharge Instructions (Signed)
1. Medications: usual home medications 2. Treatment: rest, drink plenty of fluids 3. Follow Up: please followup with your primary doctor for discussion of your diagnoses and further evaluation after today's visit; please return to the ER for new or worsening symptoms   Palpitations A palpitation is the feeling that your heartbeat is irregular or is faster than normal. It may feel like your heart is fluttering or skipping a beat. Palpitations are usually not a serious problem. However, in some cases, you may need further medical evaluation. CAUSES  Palpitations can be caused by:  Smoking.  Caffeine or other stimulants, such as diet pills or energy drinks.  Alcohol.  Stress and anxiety.  Strenuous physical activity.  Fatigue.  Certain medicines.  Heart disease, especially if you have a history of irregular heart rhythms (arrhythmias), such as atrial fibrillation, atrial flutter, or supraventricular tachycardia.  An improperly working pacemaker or defibrillator. DIAGNOSIS  To find the cause of your palpitations, your health care provider will take your medical history and perform a physical exam. Your health care provider may also have you take a test called an ambulatory electrocardiogram (ECG). An ECG records your heartbeat patterns over a 24-hour period. You may also have other tests, such as:  Transthoracic echocardiogram (TTE). During echocardiography, sound waves are used to evaluate how blood flows through your heart.  Transesophageal echocardiogram (TEE).  Cardiac monitoring. This allows your health care provider to monitor your heart rate and rhythm in real time.  Holter monitor. This is a portable device that records your heartbeat and can help diagnose heart arrhythmias. It allows your health care provider to track your heart activity for several days, if needed.  Stress tests by exercise or by giving medicine that makes the heart beat faster. TREATMENT  Treatment of  palpitations depends on the cause of your symptoms and can vary greatly. Most cases of palpitations do not require any treatment other than time, relaxation, and monitoring your symptoms. Other causes, such as atrial fibrillation, atrial flutter, or supraventricular tachycardia, usually require further treatment. HOME CARE INSTRUCTIONS   Avoid:  Caffeinated coffee, tea, soft drinks, diet pills, and energy drinks.  Chocolate.  Alcohol.  Stop smoking if you smoke.  Reduce your stress and anxiety. Things that can help you relax include:  A method of controlling things in your body, such as your heartbeats, with your mind (biofeedback).  Yoga.  Meditation.  Physical activity such as swimming, jogging, or walking.  Get plenty of rest and sleep. SEEK MEDICAL CARE IF:   You continue to have a fast or irregular heartbeat beyond 24 hours.  Your palpitations occur more often. SEEK IMMEDIATE MEDICAL CARE IF:  You have chest pain or shortness of breath.  You have a severe headache.  You feel dizzy or you faint. MAKE SURE YOU:  Understand these instructions.  Will watch your condition.  Will get help right away if you are not doing well or get worse.   This information is not intended to replace advice given to you by your health care provider. Make sure you discuss any questions you have with your health care provider.   Document Released: 07/14/2000 Document Revised: 07/22/2013 Document Reviewed: 09/15/2011 Elsevier Interactive Patient Education [employer].

## 2015-10-23 NOTE — ED Notes (Signed)
Bed: [id number] Expected date:  Expected time:  Means of arrival:  Comments: 53F "uneasy feeling" near syncope

## 2015-10-23 NOTE — ED Notes (Signed)
Pt come ed via ems, c/o SOB, and anxiety.  Pt states she woke up at 12:30pm tonight, she felt off and started getting worried. Pt was tachycardic from nervous stated by EMS. \EKG  Sinus tach, Hx of HTN, and has not taken her bp meds in two days. Left arm bp reads higher than right.  Pt comes from home: [address]. [place].  Hx of IBS and constipated.  Bowel sounds present.  V/s are 160/106, hr 120, rr 20, sat at 95 on room air.

## 2015-10-23 NOTE — ED Notes (Signed)
Awake. Verbally responsive. A/O x4. Resp even and unlabored. No audible adventitious breath sounds noted. ABC's intact.  

## 2015-10-25 ENCOUNTER — Telehealth: Payer: Self-pay | Admitting: Hematology and Oncology

## 2015-10-25 ENCOUNTER — Telehealth: Payer: Self-pay | Admitting: *Deleted

## 2015-10-25 ENCOUNTER — Encounter: Payer: Self-pay | Admitting: Hematology and Oncology

## 2015-10-25 ENCOUNTER — Ambulatory Visit (HOSPITAL_BASED_OUTPATIENT_CLINIC_OR_DEPARTMENT_OTHER): Payer: Medicare Other | Admitting: Hematology and Oncology

## 2015-10-25 VITALS — BP 138/94 | HR 88 | Temp 98.5°F | Resp 18 | Ht 63.0 in | Wt 174.2 lb

## 2015-10-25 DIAGNOSIS — I2609 Other pulmonary embolism with acute cor pulmonale: Secondary | ICD-10-CM | POA: Diagnosis not present

## 2015-10-25 DIAGNOSIS — I2602 Saddle embolus of pulmonary artery with acute cor pulmonale: Secondary | ICD-10-CM

## 2015-10-25 DIAGNOSIS — Z803 Family history of malignant neoplasm of breast: Secondary | ICD-10-CM | POA: Diagnosis not present

## 2015-10-25 DIAGNOSIS — I82402 Acute embolism and thrombosis of unspecified deep veins of left lower extremity: Secondary | ICD-10-CM

## 2015-10-25 NOTE — Telephone Encounter (Signed)
Gave and printed appt shced and avs for pt for Jan 2018

## 2015-10-25 NOTE — Progress Notes (Signed)
[facility] CONSULT NOTE  Patient Care Team: Unk Pinto, MD as PCP - General (Internal Medicine) Monna Fam, MD as Consulting Physician (Ophthalmology) Gatha Mayer, MD as Consulting Physician (Gastroenterology) Kathie Rhodes, MD as Consulting Physician (Urology) Cheri Fowler, MD as Consulting Physician (Obstetrics and Gynecology)  CHIEF COMPLAINTS/PURPOSE OF CONSULTATION:  Recent acute left lower extremity DVT with bilateral PE and cor pulmonale  HISTORY OF PRESENTING ILLNESS:  Kimberly Watson 75 y.o. female is referred here after recent diagnosis of severe left lower extremity DVT with bilateral PE and cor pulmonale. I reviewed her records extensively and collaborated the history with the patient. The patient has been taking this for over 25 years due to strong family history of breast cancer and for osteoporosis management. In January 2017, she had viral gastroenteritis with severe diarrhea and dehydration. The patient has been taking Lasix chronically for mild bilateral lower extremity edema, left greater than the right. 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 She recalled having profuse diarrhea nonstop for 24 hours leading up to the blood clot event. Of note, prior to the diagnosis, she was not taking aspirin consistently  Prior to that she denies recent history of trauma, long distance travel, recent surgery, smoking or prolonged immobilization. She had no prior history or diagnosis of cancer. Her age appropriate screening programs are up-to-date. She had prior surgeries before and never had perioperative thromboembolic events. The patient had been exposed to birth control pills and never had thrombotic events. The patient had been pregnant before and denies history of peripartum thromboembolic event or history of recurrent miscarriages. There is no family history of blood clots or miscarriages.  On  08/18/2015, she presented to the emergency department due to symptoms of severe weakness, chest pressure, shortness of breath and near syncopal episode. She had extensive evaluation in the emergency room. D-dimer was elevated at greater than 20.  CT angiogram dated 08/19/2015 show massive saddle pulmonary embolus with evidence of severe right heart strain.  Left lower extremity venous Doppler on 08/19/2015 showed findings consistent with acute deep vein thrombosis involving the left femoral vein, left popliteal vein, left posterior tibial vein and left peroneal vein.  Echocardiogram on 08/19/2015 show preserved ejection fraction but with right ventricular dilatation, moderate tricuspid valvular regurgitation, oscillating density on TV(thought to be blood clot in transit) and moderate to severe increased right systolic pressure/evidence of severe pulmonary hypertension  The patient was admitted with IV heparin anticoagulation therapy and placement of IVC filter on 08/20/2015.  Echocardiogram was repeated on 08/23/2015 which showed persistent elevated pulmonary hypertension. The mass over the right tricuspid valve is no longer visible She was hospitalized on 08/18/2015 to 08/24/2015. She was discharged home on Eliquis.  Evista was discontinued. Currently, she denies lower extremity warmth, tenderness & erythema. She continues to have chronic bilateral lower extremity edema, left greater than the right.  She denies recent chest pain on exertion, shortness of breath on minimal exertion, pre-syncopal episodes, hemoptysis, or palpitation. The patient denies any recent signs or symptoms of bleeding such as spontaneous epistaxis, hematuria or hematochezia. She has occasional rare hemorrhoidal bleeding.  MEDICAL HISTORY:  Past Medical History  Diagnosis Date  . Hypertension   . Osteoporosis   . GERD (gastroesophageal reflux disease)   . Nephrolithiasis   . Cataract   . Elevated hemoglobin A1c   .  Leiomyoma     adnexal Leiomyoma removal in 1/11  . H/O left knee  surgery     SURGICAL HISTORY: Past Surgical History  Procedure Laterality Date  . Joint replacement Left total knee  . Uterine fibroid surgery    . Cholecystectomy      SOCIAL HISTORY: Social History   Social History  . Marital Status: Divorced    Spouse Name: N/A  . Number of Children: 2  . Years of Education: N/A   Occupational History  . Teacher     Retired   Social History Main Topics  . Smoking status: Never Smoker   . Smokeless tobacco: Never Used  . Alcohol Use: No  . Drug Use: No  . Sexual Activity: Not on file     Comment: retired, next of kin, 1 son and daughter.   Other Topics Concern  . Not on file   Social History Narrative   Lives alone.  Lived with a smoker for years.     FAMILY HISTORY: Family History  Problem Relation Age of Onset  . Hypertension Mother   . Diabetes Mother   . Breast cancer Mother   . Congestive Heart Failure Mother     Died age 42  . Osteoporosis Father     ALLERGIES:  is allergic to evista and fosamax.  MEDICATIONS:  Current Outpatient Prescriptions  Medication Sig Dispense Refill  . calcium carbonate (OS-CAL) 600 MG TABS tablet Take 600 mg by mouth daily.    . cholecalciferol (VITAMIN D) 1000 UNITS tablet Take 8,000 Units by mouth daily.     . clobetasol ointment (TEMOVATE) 0.05 % Apply topically 2 (two) times daily. (Patient taking differently: Apply topically as needed. ) 45 g 1  . ELIQUIS 5 MG TABS tablet Take 5 mg by mouth 2 (two) times daily.  3  . furosemide (LASIX) 40 MG tablet Take 1 tablet (40 mg total) by mouth daily. 30 tablet 3  . MAGNESIUM PO Take 1 tablet by mouth daily.    . metoprolol succinate (TOPROL-XL) 100 MG 24 hr tablet TAKE 1 TABLET (100 MG TOTAL) BY MOUTH DAILY. TAKE WITH OR IMMEDIATELY FOLLOWING A MEAL. 90 tablet 1  . nystatin (MYCOSTATIN/NYSTOP) 100000 UNIT/GM POWD Apply to affected area after a shower daily or as needed 60 g 2   . OVER THE COUNTER MEDICATION Place 1 spray into both nostrils daily as needed (congestion).      No current facility-administered medications for this visit.    REVIEW OF SYSTEMS:   Constitutional: Denies fevers, chills or abnormal night sweats Eyes: Denies blurriness of vision, double vision or watery eyes Ears, nose, mouth, throat, and face: Denies mucositis or sore throat Respiratory: Denies cough, dyspnea or wheezes Cardiovascular: Denies palpitation, chest discomfort Gastrointestinal:  Denies nausea, heartburn or change in bowel habits Skin: Denies abnormal skin rashes Lymphatics: Denies new lymphadenopathy or easy bruising Neurological:Denies numbness, tingling or new weaknesses Behavioral/Psych: Mood is stable, no new changes  All other systems were reviewed with the patient and are negative.  PHYSICAL EXAMINATION: ECOG PERFORMANCE STATUS: 1 - Symptomatic but completely ambulatory  Filed Vitals:   10/25/15 1329  BP: 138/94  Pulse: 88  Temp: 98.5 F (36.9 C)  Resp: 18   Filed Weights   10/25/15 1329  Weight: 174 lb 3.2 oz (79.017 kg)    GENERAL:alert, no distress and comfortable. She is mildly obese SKIN: skin color, texture, turgor are normal, no rashes or significant lesions EYES: normal, conjunctiva are pink and non-injected, sclera clear OROPHARYNX:no exudate, no erythema and lips, buccal mucosa, and tongue normal  NECK: supple, thyroid normal size, non-tender, without nodularity LYMPH:  no palpable lymphadenopathy in the cervical, axillary or inguinal LUNGS: clear to auscultation and percussion with normal breathing effort HEART: regular rate & rhythm and no murmurs with left lower extremity edema ABDOMEN:abdomen soft, non-tender and normal bowel sounds Musculoskeletal:no cyanosis of digits and no clubbing  PSYCH: alert & oriented x 3 with fluent speech NEURO: no focal motor/sensory deficits  LABORATORY DATA:  I have reviewed the data as  listed  RADIOGRAPHIC STUDIES: I have personally reviewed the radiological images as listed and agreed with the findings in the report.  ASSESSMENT & PLAN:  I reviewed with the patient about the plan for care for provoked left lower extremity DVT/PE.  This last episode of blood clot appeared to be provoked. It is likely caused by dehydration along with the fact that she was on Evista. There is no role for thrombophilia panel. We discussed the importance of avoiding excessive use of Lasix to make her legs looks skinny If she felt that she is getting dehydrated again, she needs to stop Lasix  We discussed about various options of anticoagulation therapies including warfarin, low molecular weight heparin such as Lovenox or newer agents such as Rivaroxaban. Some of the risks and benefits discussed including costs involved, the need for monitoring, risks of life-threatening bleeding/hospitalization, reversibility of each agent in the event of bleeding or overdose, safety profile of each drug and taking into account other social issues such as ease of administration of medications, etc. Ultimately, we have made an informed decision for the patient to continue her treatment with Eliquis. Duration of treatment would be for 1 year  Another main issue we discussed today included the timing of IVC filter removal. I recommend she wait minimum of 3 months away from her diagnosis of blood clot. She can have her IVC filter removed then, starting around at the end of April 2017. I will defer to interventional radiologist to schedule this  Finally, at the end of our consultation today, I reinforced the importance of preventive strategies such as avoiding hormonal supplement, avoiding cigarette smoking, keeping up-to-date with screening programs for early cancer detection, frequent ambulation for long distance travel and aggressive DVT prophylaxis in all surgical settings.  She needs close follow-up with primary  care physician at least twice a year for monitoring of CBC and kidney function test while on Eliquis.  Plan to see her back next year at the end of January 2018 to discuss discontinuation of Eliquis.  All questions were answered. The patient knows to call the clinic with any problems, questions or concerns. I spent 40 minutes counseling the patient face to face. The total time spent in the appointment was 60 minutes and more than 50% was on counseling.     [facility], [place], MD 10/25/2015 2:44 PM

## 2015-10-25 NOTE — Telephone Encounter (Signed)
Called pt earlier today to ask her to come in for appt today at 1:30 pm instead of 3 pm as scheduled,  If able.   Pt agreed to come earlier.

## 2015-10-28 ENCOUNTER — Other Ambulatory Visit (HOSPITAL_COMMUNITY): Payer: Self-pay | Admitting: Interventional Radiology

## 2015-10-28 DIAGNOSIS — I82409 Acute embolism and thrombosis of unspecified deep veins of unspecified lower extremity: Secondary | ICD-10-CM

## 2015-12-06 ENCOUNTER — Other Ambulatory Visit: Payer: Self-pay | Admitting: Radiology

## 2015-12-07 ENCOUNTER — Encounter (HOSPITAL_COMMUNITY): Payer: Self-pay

## 2015-12-07 ENCOUNTER — Ambulatory Visit (HOSPITAL_COMMUNITY)
Admission: RE | Admit: 2015-12-07 | Discharge: 2015-12-07 | Disposition: A | Discharge status: Home / Self Care | Payer: Medicare Other | Source: Ambulatory Visit | Attending: Interventional Radiology | Admitting: Interventional Radiology

## 2015-12-07 DIAGNOSIS — Z4689 Encounter for fitting and adjustment of other specified devices: Secondary | ICD-10-CM | POA: Insufficient documentation

## 2015-12-07 DIAGNOSIS — Z8249 Family history of ischemic heart disease and other diseases of the circulatory system: Secondary | ICD-10-CM | POA: Insufficient documentation

## 2015-12-07 DIAGNOSIS — I10 Essential (primary) hypertension: Secondary | ICD-10-CM | POA: Insufficient documentation

## 2015-12-07 DIAGNOSIS — Z86718 Personal history of other venous thrombosis and embolism: Secondary | ICD-10-CM | POA: Diagnosis not present

## 2015-12-07 DIAGNOSIS — Z96652 Presence of left artificial knee joint: Secondary | ICD-10-CM | POA: Diagnosis not present

## 2015-12-07 DIAGNOSIS — M810 Age-related osteoporosis without current pathological fracture: Secondary | ICD-10-CM | POA: Insufficient documentation

## 2015-12-07 DIAGNOSIS — K219 Gastro-esophageal reflux disease without esophagitis: Secondary | ICD-10-CM | POA: Diagnosis not present

## 2015-12-07 DIAGNOSIS — Z7901 Long term (current) use of anticoagulants: Secondary | ICD-10-CM | POA: Diagnosis not present

## 2015-12-07 DIAGNOSIS — Z86711 Personal history of pulmonary embolism: Secondary | ICD-10-CM | POA: Diagnosis not present

## 2015-12-07 DIAGNOSIS — I82409 Acute embolism and thrombosis of unspecified deep veins of unspecified lower extremity: Secondary | ICD-10-CM

## 2015-12-07 HISTORY — DX: Acute embolism and thrombosis of unspecified deep veins of unspecified lower extremity: I82.409

## 2015-12-07 HISTORY — DX: Other pulmonary embolism without acute cor pulmonale: I26.99

## 2015-12-07 LAB — BASIC METABOLIC PANEL
Anion gap: 11 (ref 5–15)
BUN: 20 mg/dL (ref 6–20)
CO2: 25 mmol/L (ref 22–32)
Calcium: 9.6 mg/dL (ref 8.9–10.3)
Chloride: 106 mmol/L (ref 101–111)
Creatinine, Ser: 1 mg/dL (ref 0.44–1.00)
GFR calc Af Amer: >60 mL/min (ref >60)
GFR calc non Af Amer: 54 mL/min — ABNORMAL LOW (ref >60)
Glucose, Bld: 94 mg/dL (ref 65–99)
Potassium: 3.9 mmol/L (ref 3.5–5.1)
Sodium: 142 mmol/L (ref 135–145)

## 2015-12-07 LAB — CBC
HCT: 46.1 % — ABNORMAL HIGH (ref 36.0–46.0)
Hemoglobin: 14.7 g/dL (ref 12.0–15.0)
MCH: 26.6 pg (ref 26.0–34.0)
MCHC: 31.9 g/dL (ref 30.0–36.0)
MCV: 83.5 fL (ref 78.0–100.0)
Platelets: 355 10*3/uL (ref 150–400)
RBC: 5.52 MIL/uL — ABNORMAL HIGH (ref 3.87–5.11)
RDW: 15.2 % (ref 11.5–15.5)
WBC: 8.3 10*3/uL (ref 4.0–10.5)

## 2015-12-07 LAB — PROTIME-INR
INR: 1.45 (ref 0.00–1.49)
Prothrombin Time: 17.7 seconds — ABNORMAL HIGH (ref 11.6–15.2)

## 2015-12-07 LAB — APTT: aPTT: 37 seconds (ref 24–37)

## 2015-12-07 MED ORDER — FENTANYL CITRATE (PF) 100 MCG/2ML IJ SOLN
INTRAMUSCULAR | Status: AC
  Filled 2015-12-07: qty 4

## 2015-12-07 MED ORDER — MIDAZOLAM HCL 2 MG/2ML IJ SOLN
INTRAMUSCULAR | Status: AC | PRN
  Administered 2015-12-07 (×2): 1 mg via INTRAVENOUS

## 2015-12-07 MED ORDER — IOPAMIDOL (ISOVUE-300) INJECTION 61%
INTRAVENOUS | Status: AC
  Administered 2015-12-07: 35 mL
  Filled 2015-12-07: qty 100

## 2015-12-07 MED ORDER — MIDAZOLAM HCL 2 MG/2ML IJ SOLN
INTRAMUSCULAR | Status: AC
  Filled 2015-12-07: qty 4

## 2015-12-07 MED ORDER — FENTANYL CITRATE (PF) 100 MCG/2ML IJ SOLN
INTRAMUSCULAR | Status: AC | PRN
  Administered 2015-12-07 (×2): 50 ug via INTRAVENOUS

## 2015-12-07 MED ORDER — SODIUM CHLORIDE 0.9 % IV SOLN: Freq: Once | INTRAVENOUS | Status: DC

## 2015-12-07 MED ORDER — LIDOCAINE HCL 1 % IJ SOLN
INTRAMUSCULAR | Status: AC
  Administered 2015-12-07: 5 mL
  Filled 2015-12-07: qty 20

## 2015-12-07 NOTE — Sedation Documentation (Signed)
Patient is resting comfortably. 

## 2015-12-07 NOTE — Sedation Documentation (Signed)
Dsg R neck area intact

## 2015-12-07 NOTE — Procedures (Signed)
Technically successful removal of IVC filter. Access: R IJ EBL: None No immediate post procedural complications.  Ronny Bacon, MD Pager #: [phone]

## 2015-12-07 NOTE — Sedation Documentation (Signed)
Pt needs to stay in recovery 1hr and sitting upright

## 2015-12-07 NOTE — H&P (Signed)
Chief Complaint: Patient was seen in consultation today for IVC filter retrieval at the request of Dr. [name]  Referring Physician(s): Dr. [name]  Supervising Physician: Sandi Mariscal  Patient Status: Out-pt  History of Present Illness: Kimberly Watson is a 75 y.o. female with recent hx of LLE DVT and PE. She underwent IVC filter placement on 08/20/2015 She has recovered fully and recently had a LLE venous duplex showing omplete recanalization and resolution of prior left lower extremity DVT with no residual thrombus or evidence of chronic mural thrombus. She has seen Dr. Alvy Bimler to review and discuss her long term anticoagulation plan. She is now scheduled for removal of the IVC filter. She is currently taking Eliquis. She has been NPO this am. Sister is at bedside.  Past Medical History  Diagnosis Date  . Hypertension   . Osteoporosis   . GERD (gastroesophageal reflux disease)   . Nephrolithiasis   . Cataract   . Elevated hemoglobin A1c   . Leiomyoma     adnexal Leiomyoma removal in 1/11  . H/O left knee surgery   . DVT of lower extremity (deep venous thrombosis) ([place])     left, resolved  . PE (pulmonary embolism) 08/2015    Past Surgical History  Procedure Laterality Date  . Joint replacement Left total knee  . Uterine fibroid surgery    . Cholecystectomy    . Ivc filter placement ([facility] hx)  08/2015    Allergies: Evista and Fosamax  Medications: Prior to Admission medications   Medication Sig Start Date End Date Taking? Authorizing Provider  calcium carbonate (OS-CAL) 600 MG TABS tablet Take 600 mg by mouth daily.   Yes Historical Provider, MD  cholecalciferol (VITAMIN D) 1000 UNITS tablet Take 8,000 Units by mouth daily.    Yes Historical Provider, MD  clobetasol ointment (TEMOVATE) 0.05 % Apply topically 2 (two) times daily. Patient taking differently: Apply 1 application topically as needed.  07/13/15  Yes Unk Pinto, MD  ELIQUIS 5 MG TABS  tablet Take 5 mg by mouth 2 (two) times daily. 10/13/15  Yes Historical Provider, MD  furosemide (LASIX) 40 MG tablet Take 1 tablet (40 mg total) by mouth daily. 10/05/15  Yes Unk Pinto, MD  MAGNESIUM PO Take 1 tablet by mouth daily.   Yes Historical Provider, MD  metoprolol succinate (TOPROL-XL) 100 MG 24 hr tablet TAKE 1 TABLET (100 MG TOTAL) BY MOUTH DAILY. TAKE WITH OR IMMEDIATELY FOLLOWING A MEAL. 10/19/15  Yes Unk Pinto, MD  nystatin (MYCOSTATIN/NYSTOP) 100000 UNIT/GM POWD Apply to affected area after a shower daily or as needed 03/25/14  Yes Vicie Mutters, PA-C  oxymetazoline (AFRIN) 0.05 % nasal spray Place 1 spray into both nostrils 2 (two) times daily as needed for congestion.   Yes Historical Provider, MD     Family History  Problem Relation Age of Onset  . Hypertension Mother   . Diabetes Mother   . Breast cancer Mother   . Congestive Heart Failure Mother     Died age 41  . Osteoporosis Father     Social History   Social History  . Marital Status: Divorced    Spouse Name: N/A  . Number of Children: 2  . Years of Education: N/A   Occupational History  . Teacher     Retired   Social History Main Topics  . Smoking status: Never Smoker   . Smokeless tobacco: Never Used  . Alcohol Use: No  . Drug Use: No  .  Sexual Activity: Not Asked     Comment: retired, next of kin, 1 son and daughter.   Other Topics Concern  . None   Social History Narrative   Lives alone.  Lived with a smoker for years.      Review of Systems: A 12 point ROS discussed and pertinent positives are indicated in the HPI above.  All other systems are negative.  Review of Systems  Vital Signs: BP 142/83 mmHg  Pulse 59  Temp(Src) 97.4 F (36.3 C)  Resp 20  Ht 5\' 3"  (1.6 m)  Wt 175 lb (79.379 kg)  BMI 31.01 kg/m2  SpO2 99%  Physical Exam  Constitutional: She is oriented to person, place, and time. She appears well-developed and well-nourished. No distress.  HENT:  Head:  Normocephalic.  Mouth/Throat: Oropharynx is clear and moist.  Neck: Normal range of motion. No JVD present. No tracheal deviation present.  Cardiovascular: Normal rate, regular rhythm and normal heart sounds.   Pulmonary/Chest: Effort normal and breath sounds normal.  Neurological: She is alert and oriented to person, place, and time.  Psychiatric: She has a normal mood and affect. Judgment normal.    Mallampati Score:  MD Evaluation Airway: WNL Heart: WNL Abdomen: WNL Chest/ Lungs: WNL ASA  Classification: 2 Mallampati/Airway Score: One  Imaging: No results found.  Labs:  CBC:  Recent Labs  09/16/15 1506 10/21/15 1558 10/23/15 0306 12/07/15 0739  WBC 8.0 10.5 7.2 8.3  HGB 15.0 14.5 13.6 14.7  HCT 45.9 43.4 42.4 46.1*  PLT 341.0 397 373 355    COAGS:  Recent Labs  08/19/15 0041 12/07/15 0739  INR 1.26 1.45  APTT 28 37    BMP:  Recent Labs  08/23/15 0256 09/16/15 1506 10/21/15 1558 10/23/15 0306 12/07/15 0739  NA 141 142 140 146* 142  K 3.9 3.7 4.0 3.7 3.9  CL 114* 104 102 111 106  CO2 19* 30 26 24 25   GLUCOSE 106* 107* 88 114* 94  BUN 11 20 22  29* 20  CALCIUM 8.4* 10.2 9.5 9.6 9.6  CREATININE 0.85 1.03 0.91 0.88 1.00  GFRNONAA >60  --  62 >60 54*  GFRAA >60  --  71 >60 >60    LIVER FUNCTION TESTS:  Recent Labs  07/13/15 1129 08/19/15 0041 08/23/15 0256 10/21/15 1558  BILITOT 0.7 0.9 0.5 0.7  AST 19 200* 26 22  ALT 13 216* 64* 18  ALKPHOS 78 102 60 88  PROT 7.1 7.4 4.9* 6.9  ALBUMIN 4.1 4.0 2.6* 3.8    TUMOR MARKERS: No results for input(s): AFPTM, CEA, CA199, CHROMGRNA in the last 8760 hours.  Assessment and Plan: Hx of DVT/PE S.p IVC filter placement 08/2015 Plan for IVC filter retrieval today. Labs ok Risks and Benefits discussed with the patient including, but not limited to bleeding, infection, contrast induced renal failure, filter fracture or retrieval failure. All of the patient's questions were answered, patient is  agreeable to proceed. Consent signed and in chart.    Thank you for this interesting consult.  I greatly enjoyed meeting ATALIA JERABEK and look forward to participating in their care.  A copy of this report was sent to the requesting provider on this date.  Electronically Signed: Ascencion Dike 12/07/2015, 8:44 AM   I spent a total of 20 minutes in face to face in clinical consultation, greater than 50% of which was counseling/coordinating care for IVC filter retrieval

## 2015-12-07 NOTE — Discharge Instructions (Signed)
Wound Care Taking care of your wound properly can help to prevent pain and infection. It can also help your wound to heal more quickly.  HOW TO CARE FOR YOUR WOUND  Pat the wound dry with a clean towel. Do not rub it.  There are many different ways to close and cover a wound. For example, a wound can be covered with stitches (sutures), skin glue, or adhesive strips. Follow instructions from your health care provider about:  How to take care of your wound.  When and how you should change your bandage (dressing).  When you should remove your dressing.  Removing whatever was used to close your wound.  Check your wound every day for signs of infection. Watch for:  Redness, swelling, or pain.  Fluid, blood, or pus.  Keep the dressing dry until your health care provider says it can be removed. Do not take baths, swim, use a hot tub, or do anything that would put your wound underwater until your health care provider approves.  Raise (elevate) the injured area above the level of your heart while you are sitting or lying down.  Do not scratch or pick at the wound.  Keep all follow-up visits as told by your health care provider. This is important. SEEK MEDICAL CARE IF:  You received a tetanus shot and you have swelling, severe pain, redness, or bleeding at the injection site.  You have a fever.  Your pain is not controlled with medicine.  You have increased redness, swelling, or pain at the site of your wound.  You have fluid, blood, or pus coming from your wound.  You notice a bad smell coming from your wound or your dressing. SEEK IMMEDIATE MEDICAL CARE IF:  You have a red streak going away from your wound.   This information is not intended to replace advice given to you by your health care provider. Make sure you discuss any questions you have with your health care provider.   Document Released: 04/25/2008 Document Revised: 12/01/2014 Document Reviewed: 07/13/2014 Elsevier  Interactive Patient Education [employer].

## 2016-01-19 ENCOUNTER — Ambulatory Visit (INDEPENDENT_AMBULATORY_CARE_PROVIDER_SITE_OTHER): Payer: Medicare Other | Admitting: Internal Medicine

## 2016-01-19 ENCOUNTER — Encounter: Payer: Self-pay | Admitting: Internal Medicine

## 2016-01-19 VITALS — BP 122/84 | HR 76 | Temp 97.8°F | Resp 16 | Ht 64.0 in | Wt 172.8 lb

## 2016-01-19 DIAGNOSIS — R7303 Prediabetes: Secondary | ICD-10-CM

## 2016-01-19 DIAGNOSIS — Z79899 Other long term (current) drug therapy: Secondary | ICD-10-CM | POA: Diagnosis not present

## 2016-01-19 DIAGNOSIS — E559 Vitamin D deficiency, unspecified: Secondary | ICD-10-CM

## 2016-01-19 DIAGNOSIS — E782 Mixed hyperlipidemia: Secondary | ICD-10-CM

## 2016-01-19 DIAGNOSIS — K219 Gastro-esophageal reflux disease without esophagitis: Secondary | ICD-10-CM | POA: Diagnosis not present

## 2016-01-19 DIAGNOSIS — I10 Essential (primary) hypertension: Secondary | ICD-10-CM

## 2016-01-19 DIAGNOSIS — R7309 Other abnormal glucose: Secondary | ICD-10-CM | POA: Diagnosis not present

## 2016-01-19 LAB — BASIC METABOLIC PANEL WITH GFR
BUN: 17 mg/dL (ref 7–25)
CO2: 27 mmol/L (ref 20–31)
Calcium: 10.5 mg/dL — ABNORMAL HIGH (ref 8.6–10.4)
Chloride: 103 mmol/L (ref 98–110)
Creat: 0.94 mg/dL — ABNORMAL HIGH (ref 0.60–0.93)
GFR, Est African American: 69 mL/min (ref >=60)
GFR, Est Non African American: 60 mL/min (ref >=60)
Glucose, Bld: 88 mg/dL (ref 65–99)
Potassium: 4.4 mmol/L (ref 3.5–5.3)
Sodium: 142 mmol/L (ref 135–146)

## 2016-01-19 LAB — CBC WITH DIFFERENTIAL/PLATELET
Basophils Absolute: 75 cells/uL (ref 0–200)
Basophils Relative: 1 %
Eosinophils Absolute: 300 cells/uL (ref 15–500)
Eosinophils Relative: 4 %
HCT: 44.7 % (ref 35.0–45.0)
Hemoglobin: 14.9 g/dL (ref 11.7–15.5)
Lymphocytes Relative: 32 %
Lymphs Abs: 2400 cells/uL (ref 850–3900)
MCH: 27.3 pg (ref 27.0–33.0)
MCHC: 33.3 g/dL (ref 32.0–36.0)
MCV: 82 fL (ref 80.0–100.0)
MPV: 10.6 fL (ref 7.5–12.5)
Monocytes Absolute: 600 cells/uL (ref 200–950)
Monocytes Relative: 8 %
Neutro Abs: 4125 cells/uL (ref 1500–7800)
Neutrophils Relative %: 55 %
Platelets: 400 10*3/uL (ref 140–400)
RBC: 5.45 MIL/uL — ABNORMAL HIGH (ref 3.80–5.10)
RDW: 16.4 % — ABNORMAL HIGH (ref 11.0–15.0)
WBC: 7.5 10*3/uL (ref 3.8–10.8)

## 2016-01-19 LAB — HEPATIC FUNCTION PANEL
ALT: 17 U/L (ref 6–29)
AST: 24 U/L (ref 10–35)
Albumin: 4.2 g/dL (ref 3.6–5.1)
Alkaline Phosphatase: 85 U/L (ref 33–130)
Bilirubin, Direct: 0.2 mg/dL (ref <=0.2)
Indirect Bilirubin: 0.9 mg/dL (ref 0.2–1.2)
Total Bilirubin: 1.1 mg/dL (ref 0.2–1.2)
Total Protein: 7.3 g/dL (ref 6.1–8.1)

## 2016-01-19 LAB — LIPID PANEL
Cholesterol: 158 mg/dL (ref 125–200)
HDL: 70 mg/dL (ref >=46)
LDL Cholesterol: 71 mg/dL (ref <130)
Total CHOL/HDL Ratio: 2.3 Ratio (ref <=5.0)
Triglycerides: 87 mg/dL (ref <150)
VLDL: 17 mg/dL (ref <30)

## 2016-01-19 LAB — MAGNESIUM: Magnesium: 1.8 mg/dL (ref 1.5–2.5)

## 2016-01-19 LAB — TSH: TSH: 0.8 mIU/L

## 2016-01-19 NOTE — Patient Instructions (Signed)

## 2016-01-19 NOTE — Progress Notes (Deleted)
   Subjective:    Patient ID: Kimberly Watson, female    DOB: July 23, 1941, 75 y.o.   MRN: [id number]  HPI    Review of Systems     Objective:   Physical Exam        Assessment & Plan:

## 2016-01-19 NOTE — Progress Notes (Signed)
Patient ID: Kimberly Watson, female   DOB: 07/02/1941, 75 y.o.   MRN: [id number]  [facility]                       Unk Pinto, M.D.        Uvaldo Bristle. Silverio Lay, P.A.-C       Starlyn Skeans, P.A.-C   [facility]                [address] [place] [id number] Telephone [phone] Telefax [phone] ______________________________________________________________________     This very nice 75 y.o. DWF presents for 3 month follow up with Hypertension, Hyperlipidemia, Pre-Diabetes and Vitamin D Deficiency. Patient has significant hx/o provoked LLE DVT & Saddle PE attributed to [place] in Jan 2017 and will continue on Eliquis 1 yr til Jan 2018 per recommendations of Dr Alvy Bimler.      Patient is treated for HTN & BP has been controlled at home. Today's BP: 122/84 mmHg. Patient has had no complaints of any cardiac type chest pain, palpitations, dyspnea/orthopnea/PND, dizziness, claudication, or dependent edema.     Hyperlipidemia is controlled with diet & meds. Patient denies myalgias or other med SE's. Last Lipids were Cholesterol 142; HDL 60; LDL 64; Triglycerides 88 on  10/21/2015.     Also, the patient has history of PreDiabetes and has had no symptoms of reactive hypoglycemia, diabetic polys, paresthesias or visual blurring.  Last A1c was 5.8% on 08/19/2015.      Further, the patient also has history of Vitamin D Deficiency and supplements vitamin D without any suspected side-effects. Last vitamin D was still low  46 on 07/13/2015.  Medication Sig  . OS-CAL 600 MG  Take 600 mg by mouth daily.  . TEMOVATE) 0.05 % oint Apply topically 2 (two) times daily. (Patient taking differently: Apply 1 application topically as needed. )  . ELIQUIS 5 MG  Take 5 mg by mouth 2 (two) times daily.  . furosemide  40 MG Take 1 tablet (40 mg total) by mouth daily.  Marland Kitchen MAGNESIUM  Take 1 tablet by mouth daily.  .  metoprolol succinate-XL 100 MG  TAKE 1 TABLET (100 MG TOTAL) BY MOUTH DAILY. TAKE WITH OR IMMEDIATELY FOLLOWING A MEAL.  Marland Kitchen VITAMIN D 1000 UNITS  Take 8,000 Units by mouth daily.   . AFRIN 0.05 % nasal spray Place 1 spray into both nostrils 2 (two) times daily as needed for congestion.   Allergies  Allergen Reactions  . Evista [Raloxifene] Other (See Comments)    Blood clots - DVT and Pulmonary Embolism   . Fosamax [Alendronate Sodium]     esophagitis    PMHx:   Past Medical History  Diagnosis Date  . Hypertension   . Osteoporosis   . GERD (gastroesophageal reflux disease)   . Nephrolithiasis   . Cataract   . Elevated hemoglobin A1c   . Leiomyoma     adnexal Leiomyoma removal in 1/11  . H/O left knee surgery   . DVT of lower extremity (deep venous thrombosis) ([place])     left, resolved  . PE (pulmonary embolism) 08/2015   Immunization History  Administered Date(s) Administered  . DT 03/30/2015  . Pneumococcal Conjugate-13 03/25/2014  . Zoster 12/27/2005   Past Surgical History  Procedure Laterality Date  . Joint replacement Left total knee  . Uterine  fibroid surgery    . Cholecystectomy    . Ivc filter placement ([facility] hx)  08/2015   FHx:    Reviewed / unchanged  SHx:    Reviewed / unchanged  Systems Review:  Constitutional: Denies fever, chills, wt changes, headaches, insomnia, fatigue, night sweats, change in appetite. Eyes: Denies redness, blurred vision, diplopia, discharge, itchy, watery eyes.  ENT: Denies discharge, congestion, post nasal drip, epistaxis, sore throat, earache, hearing loss, dental pain, tinnitus, vertigo, sinus pain, snoring.  CV: Denies chest pain, palpitations, irregular heartbeat, syncope, dyspnea, diaphoresis, orthopnea, PND, claudication or edema. Respiratory: denies cough, dyspnea, DOE, pleurisy, hoarseness, laryngitis, wheezing.  Gastrointestinal: Denies dysphagia, odynophagia, heartburn, reflux, water brash, abdominal pain or cramps, nausea,  vomiting, bloating, diarrhea, constipation, hematemesis, melena, hematochezia  or hemorrhoids. Genitourinary: Denies dysuria, frequency, urgency, nocturia, hesitancy, discharge, hematuria or flank pain. Musculoskeletal: Denies arthralgias, myalgias, stiffness, jt. swelling, pain, limping or strain/sprain.  Skin: Denies pruritus, rash, hives, warts, acne, eczema or change in skin lesion(s). Neuro: No weakness, tremor, incoordination, spasms, paresthesia or pain. Psychiatric: Denies confusion, memory loss or sensory loss. Endo: Denies change in weight, skin or hair change.  Heme/Lymph: No excessive bleeding, bruising or enlarged lymph nodes.  Physical Exam  BP 122/84 mmHg  Pulse 76  Temp(Src) 97.8 F (36.6 C)  Resp 16  Ht 5\' 4"  (1.626 m)  Wt 172 lb 12.8 oz (78.382 kg)  BMI 29.65 kg/m2  Appears over nourished and in no distress.  Eyes: PERRLA, EOMs, conjunctiva no swelling or erythema. Sinuses: No frontal/maxillary tenderness ENT/Mouth: EAC's clear, TM's nl w/o erythema, bulging. Nares clear w/o erythema, swelling, exudates. Oropharynx clear without erythema or exudates. Oral hygiene is good. Tongue normal, non obstructing. Hearing intact.  Neck: Supple. Thyroid nl. Car 2+/2+ without bruits, nodes or JVD. Chest: Respirations nl with BS clear & equal w/o rales, rhonchi, wheezing or stridor.  Cor: Heart sounds normal w/ regular rate and rhythm without sig. murmurs, gallops, clicks, or rubs. Peripheral pulses normal and equal  without edema.  Abdomen: Soft & bowel sounds normal. Non-tender w/o guarding, rebound, hernias, masses, or organomegaly.  Lymphatics: Unremarkable.  Musculoskeletal: Full ROM all peripheral extremities, joint stability, 5/5 strength, and normal gait.  Skin: Warm, dry without exposed rashes, lesions or ecchymosis apparent.  Neuro: Cranial nerves intact, reflexes equal bilaterally. Sensory-motor testing grossly intact. Tendon reflexes grossly intact.  Pysch: Alert &  oriented x 3.  Insight and judgement nl & appropriate. No ideations.  Assessment and Plan: 1. Essential hypertension   - Continue monitor blood pressure at home. Continue diet/meds same. - TSH  2. Mixed hyperlipidemia  - Continue diet/meds, exercise,& lifestyle modifications. Continue monitor periodic cholesterol/liver & renal functions - Lipid panel - TSH  3. Prediabetes  - Continue diet, exercise, lifestyle modifications. Monitor appropriate labs. - Hemoglobin A1c - Insulin, random  4. Vitamin D deficiency   - Continue supplementation. - VITAMIN D 25 Hydroxy   5. Morbid obesity due to excess calories ([place])   6. Medication management  - CBC with Differential/Platelet - BASIC METABOLIC PANEL WITH GFR - Hepatic function panel - Magnesium  7. Gastroesophageal reflux disease   Recommended regular exercise, BP monitoring, weight control, and discussed med and SE's. Recommended labs to assess and monitor clinical status. Further disposition pending results of labs. Over 30 minutes of exam, counseling, chart review was performed  .

## 2016-01-20 LAB — VITAMIN D 25 HYDROXY (VIT D DEFICIENCY, FRACTURES): Vit D, 25-Hydroxy: 81 ng/mL (ref 30–100)

## 2016-01-20 LAB — HEMOGLOBIN A1C
Hgb A1c MFr Bld: 5.4 % (ref <5.7)
Mean Plasma Glucose: 108 mg/dL

## 2016-01-20 LAB — INSULIN, RANDOM: Insulin: 5.9 u[IU]/mL (ref 2.0–19.6)

## 2016-02-02 ENCOUNTER — Other Ambulatory Visit: Payer: Self-pay | Admitting: *Deleted

## 2016-02-02 MED ORDER — ELIQUIS 5 MG PO TABS: 5.0000 mg | ORAL_TABLET | Freq: Two times a day (BID) | ORAL | Status: DC

## 2016-02-14 ENCOUNTER — Other Ambulatory Visit: Payer: Self-pay | Admitting: Internal Medicine

## 2016-03-29 ENCOUNTER — Encounter: Payer: Self-pay | Admitting: Physician Assistant

## 2016-04-12 ENCOUNTER — Other Ambulatory Visit: Payer: Self-pay | Admitting: Internal Medicine

## 2016-04-20 DIAGNOSIS — Z23 Encounter for immunization: Secondary | ICD-10-CM | POA: Diagnosis not present

## 2016-04-24 ENCOUNTER — Encounter: Payer: Self-pay | Admitting: Physician Assistant

## 2016-04-24 ENCOUNTER — Ambulatory Visit (INDEPENDENT_AMBULATORY_CARE_PROVIDER_SITE_OTHER): Payer: Medicare Other | Admitting: Physician Assistant

## 2016-04-24 VITALS — BP 126/76 | HR 69 | Temp 97.3°F | Resp 14 | Ht 62.5 in | Wt 175.2 lb

## 2016-04-24 DIAGNOSIS — Z79899 Other long term (current) drug therapy: Secondary | ICD-10-CM

## 2016-04-24 DIAGNOSIS — N179 Acute kidney failure, unspecified: Secondary | ICD-10-CM | POA: Diagnosis not present

## 2016-04-24 DIAGNOSIS — R7309 Other abnormal glucose: Secondary | ICD-10-CM

## 2016-04-24 DIAGNOSIS — N200 Calculus of kidney: Secondary | ICD-10-CM | POA: Diagnosis not present

## 2016-04-24 DIAGNOSIS — I2602 Saddle embolus of pulmonary artery with acute cor pulmonale: Secondary | ICD-10-CM

## 2016-04-24 DIAGNOSIS — Z136 Encounter for screening for cardiovascular disorders: Secondary | ICD-10-CM | POA: Diagnosis not present

## 2016-04-24 DIAGNOSIS — Z0001 Encounter for general adult medical examination with abnormal findings: Secondary | ICD-10-CM

## 2016-04-24 DIAGNOSIS — I10 Essential (primary) hypertension: Secondary | ICD-10-CM | POA: Diagnosis not present

## 2016-04-24 DIAGNOSIS — E782 Mixed hyperlipidemia: Secondary | ICD-10-CM | POA: Diagnosis not present

## 2016-04-24 DIAGNOSIS — K219 Gastro-esophageal reflux disease without esophagitis: Secondary | ICD-10-CM | POA: Diagnosis not present

## 2016-04-24 DIAGNOSIS — R6889 Other general symptoms and signs: Secondary | ICD-10-CM | POA: Diagnosis not present

## 2016-04-24 DIAGNOSIS — I272 Other secondary pulmonary hypertension: Secondary | ICD-10-CM

## 2016-04-24 DIAGNOSIS — I2699 Other pulmonary embolism without acute cor pulmonale: Secondary | ICD-10-CM | POA: Insufficient documentation

## 2016-04-24 DIAGNOSIS — I2729 Other secondary pulmonary hypertension: Secondary | ICD-10-CM

## 2016-04-24 DIAGNOSIS — E559 Vitamin D deficiency, unspecified: Secondary | ICD-10-CM | POA: Diagnosis not present

## 2016-04-24 DIAGNOSIS — M810 Age-related osteoporosis without current pathological fracture: Secondary | ICD-10-CM

## 2016-04-24 DIAGNOSIS — I82402 Acute embolism and thrombosis of unspecified deep veins of left lower extremity: Secondary | ICD-10-CM

## 2016-04-24 DIAGNOSIS — Z0000 Encounter for general adult medical examination without abnormal findings: Secondary | ICD-10-CM

## 2016-04-24 HISTORY — DX: Other pulmonary embolism without acute cor pulmonale: I26.99

## 2016-04-24 LAB — HEPATIC FUNCTION PANEL
ALT: 16 U/L (ref 6–29)
AST: 21 U/L (ref 10–35)
Albumin: 4 g/dL (ref 3.6–5.1)
Alkaline Phosphatase: 69 U/L (ref 33–130)
Bilirubin, Direct: 0.2 mg/dL (ref <=0.2)
Indirect Bilirubin: 0.8 mg/dL (ref 0.2–1.2)
Total Bilirubin: 1 mg/dL (ref 0.2–1.2)
Total Protein: 6.8 g/dL (ref 6.1–8.1)

## 2016-04-24 LAB — BASIC METABOLIC PANEL WITH GFR
BUN: 16 mg/dL (ref 7–25)
CO2: 23 mmol/L (ref 20–31)
Calcium: 9.8 mg/dL (ref 8.6–10.4)
Chloride: 103 mmol/L (ref 98–110)
Creat: 0.93 mg/dL (ref 0.60–0.93)
GFR, Est African American: 69 mL/min (ref >=60)
GFR, Est Non African American: 60 mL/min (ref >=60)
Glucose, Bld: 81 mg/dL (ref 65–99)
Potassium: 4 mmol/L (ref 3.5–5.3)
Sodium: 140 mmol/L (ref 135–146)

## 2016-04-24 LAB — MAGNESIUM: Magnesium: 2 mg/dL (ref 1.5–2.5)

## 2016-04-24 LAB — LIPID PANEL
Cholesterol: 147 mg/dL (ref 125–200)
HDL: 55 mg/dL (ref >=46)
LDL Cholesterol: 73 mg/dL (ref <130)
Total CHOL/HDL Ratio: 2.7 Ratio (ref <=5.0)
Triglycerides: 97 mg/dL (ref <150)
VLDL: 19 mg/dL (ref <30)

## 2016-04-24 LAB — TSH: TSH: 1.29 mIU/L

## 2016-04-24 NOTE — Progress Notes (Signed)
Medicare wellness  Assessment:   Essential hypertension - continue medications, DASH diet, exercise and monitor at home. Call if greater than 130/80.  - CBC with Differential/Platelet - BASIC METABOLIC PANEL WITH GFR - Hepatic function panel - TSH - Urinalysis, Routine w reflex microscopic (not at [facility]) - Microalbumin / creatinine urine ratio - EKG 12-Lead  Mixed hyperlipidemia -continue medications, check lipids, decrease fatty foods, increase activity.  - Lipid panel  Prediabetes Discussed general issues about diabetes pathophysiology and management., Educational material distributed., Suggested low cholesterol diet., Encouraged aerobic exercise., Discussed foot care., Reminded to get yearly retinal exam. - Hemoglobin A1c  Osteoporosis Continue CA and vitamin D, get DEXA - DG Bone Density; Future   Vitamin D deficiency - Vit D  25 hydroxy (rtn osteoporosis monitoring)  Medication management - Magnesium  Nephrolithiasis Increase fluids   Reflux esophagitis Get on PPI/H2 blocker, diet discussed  Obesity Obesity with co morbidities- long discussion about weight loss, diet, and exercise  Acute deep vein thrombosis (DVT) of left lower extremity, unspecified vein ([place]) Continue eliquis, has appointment with hematology in Jan  Acute saddle pulmonary embolism with acute cor pulmonale ([place]) Continue eliquis, has appointment with hematology in Jan  AKI (acute kidney injury) ([place]) Increase fluids, avoid NSAIDS, will monitor -     BASIC METABOLIC PANEL WITH GFR  Encounter for Medicare annual wellness exam Needs colonoscopy, will get in Jan/Feb after off eliquis  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     Plan:   During the course of the visit the patient was educated and counseled about appropriate screening and preventive services including:     Influenza vaccine- declines until next year  Screening electrocardiogram  Screening mammography- yearly  Bone densitometry screening  Colorectal cancer screening  Diabetes screening- today  Glaucoma screening- yearly  Nutrition counseling   Advanced directives:    Subjective:   Kimberly Watson is a 75 y.o. female who presents for Medicare Annual Wellness Visit and 3 month follow up on hypertension, prediabetes, hyperlipidemia, vitamin D def.   Daughter getting married in Oct, and has continuing right knee pain and would like replacement eventually.   Her blood pressure has been controlled at home, today their BP is BP: 126/76  She does not work out but wants to start going to gym with her partner.  She denies chest pain, shortness of breath, dizziness. She has not had any shortness of breath, had pulmonary hypertension on echo due to PE, not symptoms at this time.  Her cholesterol is diet controlled.  Her cholesterol is controlled. The cholesterol last visit was:   Lab Results  Component Value Date   CHOL 158 01/19/2016   HDL 70 01/19/2016   LDLCALC 71 01/19/2016   TRIG 87 01/19/2016   CHOLHDL 2.3 01/19/2016   She has been working on diet and exercise for prediabetes, and has no polyuria or polydipsia, no chest pain, dyspnea or TIAs, no numbness, tingling or pain in extremities. Last A1C in the office was:  Lab Results  Component Value Date   HGBA1C 5.4 01/19/2016   Patient is on Vitamin D supplement.  She has history of osteoporosis, has been on reclast in the past, last DEXA showed unchanged.  She was on evista for osteoporosis, she was taken off in Jan due to submassive bilateral saddle PE with right heart strain. She has seen Dr. Alvy Bimler and had  1 year of eliquis, she has follow up visit Jan 2018 to discontinue eliquis.  She is due for colonoscopy and has been having GERD with some trouble with meats/bread, has had esophagus stretched before, will wait until  after Jan.  She has history of nephrolithiasis, follows with Dr. Karsten Ro.   BMI is Body mass index is 31.53 kg/m., she is working on diet and exercise. Wt Readings from Last 3 Encounters:  04/24/16 175 lb 3.2 oz (79.5 kg)  01/19/16 172 lb 12.8 oz (78.4 kg)  12/07/15 175 lb (79.4 kg)    Names of Other Physician/Practitioners you currently use: 1. [facility]- here for primary care 2. Dr. Herbert Deaner, eye doctor, last visit Glasses/contacts, 11/2012 3. , dentist, last visit every 6 months, 06/2013 Patient Care Team: Unk Pinto, MD as PCP - General (Internal Medicine) Monna Fam, MD as Consulting Physician (Ophthalmology) Gatha Mayer, MD as Consulting Physician (Gastroenterology) Kathie Rhodes, MD as Consulting Physician (Urology) Cheri Fowler, MD as Consulting Physician (Obstetrics and Gynecology)  Medication Review Current Outpatient Prescriptions on File Prior to Visit  Medication Sig Dispense Refill  . calcium carbonate (OS-CAL) 600 MG TABS tablet Take 600 mg by mouth daily.    . Cholecalciferol (VITAMIN D PO) Take 2,000 Units by mouth. Takes 8000 units daily.    . clobetasol ointment (TEMOVATE) 0.05 % Apply topically 2 (two) times daily. (Patient taking differently: Apply 1 application topically as needed. ) 45 g 1  . ELIQUIS 5 MG TABS tablet Take 1 tablet (5 mg total) by mouth 2 (two) times daily. 60 tablet 3  . furosemide (LASIX) 40 MG tablet TAKE 1 TABLET (40 MG TOTAL) BY MOUTH DAILY. 30 tablet 3  . MAGNESIUM PO Take 1 tablet by mouth daily.    . metoprolol succinate (TOPROL-XL) 100 MG 24 hr tablet TAKE 1 TABLET BY MOUTH EVERY DAY TAKE WITH OR IMMEDIATELY AFTER A MEAL 90 tablet 1  . nystatin (MYCOSTATIN/NYSTOP) 100000 UNIT/GM POWD Apply to affected area after a shower daily or as needed 60 g 2  . OVER THE COUNTER MEDICATION Used OTC nasal spray, [place],     No current facility-administered medications on file prior to visit.      Current Problems (verified) Patient Active Problem List   Diagnosis Date Noted  . Encounter for Medicare annual wellness exam 04/24/2016  . 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 AKI (acute kidney injury) ([place]) 08/19/2015  . DVT (deep venous thrombosis) ([place])   . Acute saddle pulmonary embolism with acute cor pulmonale ([facility])   . Osteoporosis 03/30/2015  . Medication management 03/30/2015  . Morbid obesity ([place]) 03/30/2015  . Essential hypertension 06/18/2013  . Mixed hyperlipidemia 06/18/2013  . Other abnormal glucose 06/18/2013  . Vitamin D deficiency 06/18/2013  . GERD (gastroesophageal reflux disease) 06/18/2013  . Nephrolithiasis 06/18/2013   Screening Tests The patient has no Health Maintenance topics of status Overdue, Due On, or Due Soon  Immunization History  Administered Date(s) Administered  . DT 03/30/2015  . Pneumococcal Conjugate-13 03/25/2014  . Zoster 12/27/2005    Preventative care: Last colonoscopy: 2005 due 2015, never got due to father passing, will get after eliquis Last mammogram: 04/2015 Last pap smear/pelvic exam: remote DEXA: 04/2015 Osteopenia- had (reclast 08,09,11) DUE Echo 08/2015  Prior vaccinations: TD or Tdap: 2016 Influenza: 2017 at [facility] Prevnar 13: 2015 Pneumococcal: 2008  Shingles/Zostavax: 2007  Allergies Allergies  Allergen Reactions  . Evista [Raloxifene] Other (See Comments)  Blood clots - DVT and Pulmonary Embolism   . Fosamax [Alendronate Sodium]     esophagitis    SURGICAL HISTORY She  has a past surgical history that includes Joint replacement (Left, total knee); Uterine fibroid surgery; Cholecystectomy; and IVC FILTER PLACEMENT ([facility] HX) (08/2015). FAMILY HISTORY Her family history includes Breast cancer in her mother; Congestive Heart Failure in her mother; Diabetes in her mother; Hypertension in her mother; Osteoporosis in her father. SOCIAL HISTORY She  reports that she has never  smoked. She has never used smokeless tobacco. She reports that she does not drink alcohol or use drugs.  MEDICARE WELLNESS OBJECTIVES: Physical activity: Current Exercise Habits: The patient does not participate in regular exercise at present Cardiac risk factors: Cardiac Risk Factors include: advanced age (>52men, >86 women);dyslipidemia;hypertension;sedentary lifestyle;obesity (BMI >30kg/m2) Depression/mood screen:   Depression screen [facility] 2/9 04/24/2016  Decreased Interest 0  Down, Depressed, Hopeless 0  PHQ - 2 Score 0    ADLs:  In your present state of health, do you have any difficulty performing the following activities: 04/24/2016 01/19/2016  Hearing? N N  Vision? N N  Difficulty concentrating or making decisions? N N  Walking or climbing stairs? N N  Dressing or bathing? N N  Doing errands, shopping? N N  Some recent data might be hidden     Cognitive Testing  Alert? Yes  Normal Appearance?Yes  Oriented to person? Yes  Place? Yes   Time? Yes  Recall of three objects?  Yes  Can perform simple calculations? Yes  Displays appropriate judgment?Yes  Can read the correct time from a watch face?Yes  EOL planning: Does patient have an advance directive?: No Would patient like information on creating an advanced directive?: Yes - Educational materials given  Review of Systems  Constitutional: Negative.   HENT: Positive for congestion. Negative for ear discharge, ear pain, hearing loss, nosebleeds, sore throat and tinnitus.   Eyes: Negative.   Respiratory: Negative for cough, hemoptysis, sputum production, shortness of breath, wheezing and stridor.   Cardiovascular: Positive for leg swelling. Negative for chest pain, palpitations, orthopnea, claudication and PND.  Gastrointestinal: Positive for heartburn. Negative for abdominal pain, blood in stool, constipation, diarrhea, melena, nausea and vomiting.  Genitourinary: Negative.   Musculoskeletal: Positive for joint pain (right  knee). Negative for back pain, falls, myalgias and neck pain.  Skin: Negative.   Neurological: Negative.  Negative for headaches.  Psychiatric/Behavioral: Negative.      Objective:   Blood pressure 126/76, pulse 69, temperature 97.3 F (36.3 C), resp. rate 14, height 5' 2.5" (1.588 m), weight 175 lb 3.2 oz (79.5 kg), SpO2 97 %. Body mass index is 31.53 kg/m.  General appearance: alert, no distress, WD/WN,  female HEENT: normocephalic, sclerae anicteric, TMs pearly, nares patent, no discharge or erythema, pharynx normal Oral cavity: MMM, no lesions Neck: supple, no lymphadenopathy, no thyromegaly, no masses Heart: RRR, normal S1, S2, no murmurs Lungs: CTA bilaterally, no wheezes, rhonchi, or rales Abdomen: +bs, soft, non tender, non distended, no masses, no hepatomegaly, no splenomegaly Musculoskeletal: nontender, no swelling, no obvious deformity,  Extremities: no edema, no cyanosis, no clubbing Pulses: 2+ symmetric, upper and lower extremities, normal cap refill Neurological: alert, oriented x 3, CN2-12 intact, strength normal upper extremities and lower extremities, sensation normal throughout, DTRs 2+ throughout, no cerebellar signs, gait antalgic Psychiatric: normal affect, behavior normal, pleasant  Breast: nontender, no masses or lumps, no skin changes, no nipple discharge or inversion, no axillary lymphadenopathy Gyn: defer  Rectal: defer  Medicare Attestation I have personally reviewed: The patient's medical and social history Their use of alcohol, tobacco or illicit drugs Their current medications and supplements The patient's functional ability including ADLs,fall risks, home safety risks, cognitive, and hearing and visual impairment Diet and physical activities Evidence for depression or mood disorders  The patient's weight, height, BMI, and visual acuity have been recorded in the chart.  I have made referrals, counseling, and provided education to the patient based  on review of the above and I have provided the patient with a written personalized care plan for preventive services.     Kimberly Mutters, PA-C   04/24/2016

## 2016-04-24 NOTE — Patient Instructions (Addendum)
Take omeprazole over the counter for 2 weeks, then go to zantac 150-300 mg at night for 2 weeks, then you can stop.  Avoid alcohol, spicy foods, NSAIDS (aleve, ibuprofen) at this time. See foods below.   Food Choices for Gastroesophageal Reflux Disease When you have gastroesophageal reflux disease (GERD), the foods you eat and your eating habits are very important. Choosing the right foods can help ease the discomfort of GERD. WHAT GENERAL GUIDELINES DO I NEED TO FOLLOW?  Choose fruits, vegetables, whole grains, low-fat dairy products, and low-fat meat, fish, and poultry.  Limit fats such as oils, salad dressings, butter, nuts, and avocado.  Keep a food diary to identify foods that cause symptoms.  Avoid foods that cause reflux. These may be different for different people.  Eat frequent small meals instead of three large meals each day.  Eat your meals slowly, in a relaxed setting.  Limit fried foods.  Cook foods using methods other than frying.  Avoid drinking alcohol.  Avoid drinking large amounts of liquids with your meals.  Avoid bending over or lying down until 2-3 hours after eating. WHAT FOODS ARE NOT RECOMMENDED? The following are some foods and drinks that may worsen your symptoms: Vegetables Tomatoes. Tomato juice. Tomato and spaghetti sauce. Chili peppers. Onion and garlic. Horseradish. Fruits Oranges, grapefruit, and lemon (fruit and juice). Meats High-fat meats, fish, and poultry. This includes hot dogs, ribs, ham, sausage, salami, and bacon. Dairy Whole milk and chocolate milk. Sour cream. Cream. Butter. Ice cream. Cream cheese.  Beverages Coffee and tea, with or without caffeine. Carbonated beverages or energy drinks. Condiments Hot sauce. Barbecue sauce.  Sweets/Desserts Chocolate and cocoa. Donuts. Peppermint and spearmint. Fats and Oils High-fat foods, including [place] fries and potato chips. Other Vinegar. Strong spices, such as black pepper, white  pepper, red pepper, cayenne, curry powder, cloves, ginger, and chili powder.   Here is some information to help you keep your heart healthy: Move it! - Aim for 30 mins of activity every day. Take it slowly at first. Talk to [place] before starting any new exercise program.   Lose it.  -Body Mass Index (BMI) can indicate if you need to lose weight. A healthy range is 18.5-24.9. For a BMI calculator, go to [email or web address]  Waist Management -Excess abdominal fat is a risk factor for heart disease, diabetes, asthma, stroke and more. Ideal waist circumference is less than 35" for women and less than 40" for men.   Eat Right -focus on fruits, vegetables, whole grains, and meals you make yourself. Avoid foods with trans fat and high sugar/sodium content.   Snooze or Snore? - Loud snoring can be a sign of sleep apnea, a significant risk factor for high blood pressure, heart attach, stroke, and heart arrhythmias.  Kick the habit -Quit Smoking! Avoid second hand smoke. A single cigarette raises your blood pressure for 20 mins and increases the risk of heart attack and stroke for the next 24 hours.   Are Aspirin and Supplements right for you? -Add ENTERIC COATED low dose 81 mg Aspirin daily OR can do every other day if you have easy bruising to protect your heart and head. As well as to reduce risk of Colon Cancer by 20 %, Skin Cancer by 26 % , Melanoma by 46% and Pancreatic cancer by 60%  Say "No to Stress -There may be little you can do about problems that cause stress. However, techniques such as long walks, meditation, and exercise can help you manage it.  Start Now! - Make changes one at a time and set reasonable goals to increase your likelihood of success.

## 2016-04-25 LAB — CBC WITH DIFFERENTIAL/PLATELET
Basophils Absolute: 138 cells/uL (ref 0–200)
Basophils Relative: 2 %
Eosinophils Absolute: 414 cells/uL (ref 15–500)
Eosinophils Relative: 6 %
HCT: 44.7 % (ref 35.0–45.0)
Hemoglobin: 14.7 g/dL (ref 11.7–15.5)
Lymphocytes Relative: 32 %
Lymphs Abs: 2208 cells/uL (ref 850–3900)
MCH: 26.6 pg — ABNORMAL LOW (ref 27.0–33.0)
MCHC: 32.9 g/dL (ref 32.0–36.0)
MCV: 81 fL (ref 80.0–100.0)
MPV: 10.5 fL (ref 7.5–12.5)
Monocytes Absolute: 621 cells/uL (ref 200–950)
Monocytes Relative: 9 %
Neutro Abs: 3519 cells/uL (ref 1500–7800)
Neutrophils Relative %: 51 %
Platelets: 403 10*3/uL — ABNORMAL HIGH (ref 140–400)
RBC: 5.52 MIL/uL — ABNORMAL HIGH (ref 3.80–5.10)
RDW: 15.1 % — ABNORMAL HIGH (ref 11.0–15.0)
WBC: 6.9 10*3/uL (ref 3.8–10.8)

## 2016-04-25 LAB — URINALYSIS, ROUTINE W REFLEX MICROSCOPIC
Bilirubin Urine: NEGATIVE
Glucose, UA: NEGATIVE
Hgb urine dipstick: NEGATIVE
Ketones, ur: NEGATIVE
Leukocytes, UA: NEGATIVE
Nitrite: NEGATIVE
Protein, ur: NEGATIVE
Specific Gravity, Urine: 1.009 (ref 1.001–1.035)
pH: 5 (ref 5.0–8.0)

## 2016-04-25 LAB — HEMOGLOBIN A1C
Hgb A1c MFr Bld: 5.2 % (ref <5.7)
Mean Plasma Glucose: 103 mg/dL

## 2016-04-25 LAB — MICROALBUMIN / CREATININE URINE RATIO
Creatinine, Urine: 12 mg/dL — ABNORMAL LOW (ref 20–320)
Microalb, Ur: <0.2 mg/dL

## 2016-04-27 DIAGNOSIS — Z1231 Encounter for screening mammogram for malignant neoplasm of breast: Secondary | ICD-10-CM | POA: Diagnosis not present

## 2016-04-27 DIAGNOSIS — Z853 Personal history of malignant neoplasm of breast: Secondary | ICD-10-CM | POA: Diagnosis not present

## 2016-05-11 DIAGNOSIS — H43811 Vitreous degeneration, right eye: Secondary | ICD-10-CM | POA: Diagnosis not present

## 2016-05-11 DIAGNOSIS — H04123 Dry eye syndrome of bilateral lacrimal glands: Secondary | ICD-10-CM | POA: Diagnosis not present

## 2016-05-11 DIAGNOSIS — H2513 Age-related nuclear cataract, bilateral: Secondary | ICD-10-CM | POA: Diagnosis not present

## 2016-05-11 DIAGNOSIS — H25013 Cortical age-related cataract, bilateral: Secondary | ICD-10-CM | POA: Diagnosis not present

## 2016-06-01 ENCOUNTER — Other Ambulatory Visit: Payer: Self-pay | Admitting: Internal Medicine

## 2016-06-04 ENCOUNTER — Encounter: Payer: Self-pay | Admitting: *Deleted

## 2016-08-24 ENCOUNTER — Ambulatory Visit (INDEPENDENT_AMBULATORY_CARE_PROVIDER_SITE_OTHER): Payer: Medicare Other | Admitting: Physician Assistant

## 2016-08-24 ENCOUNTER — Encounter: Payer: Self-pay | Admitting: Physician Assistant

## 2016-08-24 ENCOUNTER — Other Ambulatory Visit: Payer: Self-pay

## 2016-08-24 VITALS — BP 124/78 | HR 84 | Temp 97.9°F | Resp 14 | Ht 62.5 in | Wt 178.6 lb

## 2016-08-24 DIAGNOSIS — R0602 Shortness of breath: Secondary | ICD-10-CM

## 2016-08-24 DIAGNOSIS — M25561 Pain in right knee: Secondary | ICD-10-CM

## 2016-08-24 DIAGNOSIS — H6123 Impacted cerumen, bilateral: Secondary | ICD-10-CM

## 2016-08-24 DIAGNOSIS — I2699 Other pulmonary embolism without acute cor pulmonale: Secondary | ICD-10-CM | POA: Diagnosis not present

## 2016-08-24 DIAGNOSIS — H9203 Otalgia, bilateral: Secondary | ICD-10-CM

## 2016-08-24 DIAGNOSIS — Z79899 Other long term (current) drug therapy: Secondary | ICD-10-CM | POA: Diagnosis not present

## 2016-08-24 DIAGNOSIS — I2729 Other secondary pulmonary hypertension: Secondary | ICD-10-CM

## 2016-08-24 DIAGNOSIS — I10 Essential (primary) hypertension: Secondary | ICD-10-CM

## 2016-08-24 DIAGNOSIS — N390 Urinary tract infection, site not specified: Secondary | ICD-10-CM | POA: Diagnosis not present

## 2016-08-24 DIAGNOSIS — M25562 Pain in left knee: Secondary | ICD-10-CM | POA: Diagnosis not present

## 2016-08-24 DIAGNOSIS — E782 Mixed hyperlipidemia: Secondary | ICD-10-CM

## 2016-08-24 LAB — TSH: TSH: 1.35 mIU/L

## 2016-08-24 MED ORDER — FUROSEMIDE 40 MG PO TABS: ORAL_TABLET | ORAL | 3 refills | Status: DC

## 2016-08-24 NOTE — Patient Instructions (Signed)
Please call cardiology and follow up with them for an echo We will get [place] of legs If any worsening shortness of breath, chest pain, go to ER Continue elliquis until after you trip   Shortness of Breath, Adult Shortness of breath is when a person has trouble breathing enough air, or when a person feels like she or he is having trouble breathing in enough air. Shortness of breath could be a sign of medical problem. Follow these instructions at home: Pay attention to any changes in your symptoms. Take these actions to help with your condition:  Do not smoke. Smoking is a common cause of shortness of breath. If you smoke and you need help quitting, ask your health care provider.  Avoid things that can irritate your airways, such as:  Mold.  Dust.  Air pollution.  Chemical fumes.  Things that can cause allergy symptoms (allergens), if you have allergies.  Keep your living space clean and free of mold and dust.  Rest as needed. Slowly return to your usual activities.  Take over-the-counter and prescription medicines, including oxygen and inhaled medicines, only as told by your health care provider.  Keep all follow-up visits as told by your health care provider. This is important. Contact a health care provider if:  Your condition does not improve as soon as expected.  You have a hard time doing your normal activities, even after you rest.  You have new symptoms. Get help right away if:  Your shortness of breath gets worse.  You have shortness of breath when you are resting.  You feel light-headed or you faint.  You have a cough that is not controlled with medicines.  You cough up blood.  You have pain with breathing.  You have pain in your chest, arms, shoulders, or abdomen.  You have a fever.  You cannot walk up stairs or exercise the way that you normally do. This information is not intended to replace advice given to you by your health care provider. Make sure  you discuss any questions you have with your health care provider. Document Released: 04/11/2001 Document Revised: 02/05/2016 Document Reviewed: 12/23/2015 Elsevier Interactive Patient Education  2017 [employer].

## 2016-08-24 NOTE — Progress Notes (Addendum)
Assessment and Plan:  Hypertension -Continue medication, monitor blood pressure at home. Continue DASH diet.  Reminder to go to the ER if any CP, SOB, nausea, dizziness, severe HA, changes vision/speech, left arm numbness and tingling and jaw pain.  Cholesterol -Continue diet and exercise. Check cholesterol.   Vitamin D Def - check level and continue medications.   PE Off evista, will do x 12 months, will stop elliquis after her trip and get back on 2 BASA daily  Morbid Obesity with co morbidities - long discussion about weight loss, diet, and exercise  Medication management -     Magnesium  SOB (shortness of breath) No CP, very slight, ? From PHTN, will get [place] bilateral legs and needs to follow up cardiology for repeat echo, may need pulmonary referral - increase walking - any worsening SOB or any CP go to ER  Cerumen impaction/ Ear pain - stop using Qtips, irrigation used in the office without complications, use OTC drops/oil at home to prevent reoccurence  Arthralgia of both lower legs - continue elliquis -     VAS [place] LOWER EXTREMITY VENOUS (DVT); Future   Continue diet and meds as discussed. Further disposition pending results of labs. Over 30 minutes of exam, counseling, chart review, and critical decision making was performed Future Appointments Date Time Provider Caroleen  08/28/2016 1:30 PM [name], MD [facility] None  05/02/2017 10:00 AM Vicie Mutters, PA-C [facility] None     HPI 76 y.o. female  presents for 3 month follow up on hypertension, cholesterol, prediabetes, and vitamin D deficiency.  Patient had SIRS and hypoxemia due to PE in Jan 2017, on eliquis. Had repeat US at Dr. Lisabeth Devoid and she is going to [facility] Jan 28 to Feb 10th, it will be a year since she has had the PE due to Evista.   Her blood pressure has been controlled at home, has been off lasix x 1 month, today their BP is BP: 124/78  She does not workout. She denies chest pain,   Dizziness. She has had some SOB with exertion with tightness, no CP, no dizziness, palpitations, diaphroesis. Did have PHTN with RSVP of 70 on echo in Jan. Will repeat DVT study with new SOB, follow back up with cardio for echo.   She is on cholesterol medication and denies myalgias. Her cholesterol is at goal. The cholesterol last visit was:   Lab Results  Component Value Date   CHOL 147 04/24/2016   HDL 55 04/24/2016   LDLCALC 73 04/24/2016   TRIG 97 04/24/2016   CHOLHDL 2.7 04/24/2016    She has been working on diet and exercise for prediabetes, and denies paresthesia of the feet, polydipsia, polyuria and visual disturbances. Last A1C in the office was:  Lab Results  Component Value Date   HGBA1C 5.2 04/24/2016   Patient is on Vitamin D supplement.   Lab Results  Component Value Date   VD25OH 81 01/19/2016     BMI is Body mass index is 32.15 kg/m., she is working on diet and exercise. Wt Readings from Last 3 Encounters:  08/24/16 178 lb 9.6 oz (81 kg)  04/24/16 175 lb 3.2 oz (79.5 kg)  01/19/16 172 lb 12.8 oz (78.4 kg)    Current Medications:  Current Outpatient Prescriptions on File Prior to Visit  Medication Sig Dispense Refill  . calcium carbonate (OS-CAL) 600 MG TABS tablet Take 600 mg by mouth daily.    . Cholecalciferol (VITAMIN D PO) Take 2,000 Units  by mouth. Takes 8000 units daily.    . clobetasol ointment (TEMOVATE) 0.05 % Apply topically 2 (two) times daily. (Patient taking differently: Apply 1 application topically as needed. ) 45 g 1  . ELIQUIS 5 MG TABS tablet TAKE 1 TABLET BY MOUTH TWICE A DAY 60 tablet 3  . MAGNESIUM PO Take 1 tablet by mouth daily.    . metoprolol succinate (TOPROL-XL) 100 MG 24 hr tablet TAKE 1 TABLET BY MOUTH EVERY DAY TAKE WITH OR IMMEDIATELY AFTER A MEAL 90 tablet 1  . nystatin (MYCOSTATIN/NYSTOP) 100000 UNIT/GM POWD Apply to affected area after a shower daily or as needed 60 g 2  . OVER THE COUNTER MEDICATION Used OTC nasal spray, Ayr,      No current facility-administered medications on file prior to visit.    Medical History:  Past Medical History:  Diagnosis Date  . Cataract   . DVT of lower extremity (deep venous thrombosis) ([place])    left, resolved  . Elevated hemoglobin A1c   . GERD (gastroesophageal reflux disease)   . H/O left knee surgery   . Hypertension   . Leiomyoma    adnexal Leiomyoma removal in 1/11  . Nephrolithiasis   . Osteoporosis   . PE (pulmonary embolism) 08/2015   Allergies:  Allergies  Allergen Reactions  . Evista [Raloxifene] Other (See Comments)    Blood clots - DVT and Pulmonary Embolism   . Fosamax [Alendronate Sodium]     esophagitis     Review of Systems:  Review of Systems  Constitutional: Negative.   HENT: Positive for ear pain (with cerumen impaction) and hearing loss. Negative for congestion.   Eyes: Negative.   Respiratory: Positive for shortness of breath. Negative for cough, hemoptysis, sputum production and wheezing.   Cardiovascular: Negative.  Negative for chest pain, palpitations, orthopnea, claudication, leg swelling and PND.  Gastrointestinal: Negative.   Genitourinary: Negative.   Musculoskeletal: Positive for joint pain (right knee). Negative for back pain, falls, myalgias and neck pain.  Skin: Negative.   Neurological: Negative.   Endo/Heme/Allergies: Negative.   Psychiatric/Behavioral: Negative.     Family history- Review and unchanged Social history- Review and unchanged Physical Exam: BP 124/78   Pulse 84   Temp 97.9 F (36.6 C)   Resp 14   Ht 5' 2.5" (1.588 m)   Wt 178 lb 9.6 oz (81 kg)   SpO2 96%   BMI 32.15 kg/m  Wt Readings from Last 3 Encounters:  08/24/16 178 lb 9.6 oz (81 kg)  04/24/16 175 lb 3.2 oz (79.5 kg)  01/19/16 172 lb 12.8 oz (78.4 kg)   General Appearance: Well nourished, in no apparent distress. Eyes: PERRLA, EOMs, conjunctiva no swelling or erythema Sinuses: No Frontal/maxillary tenderness ENT/Mouth: Ext aud canals clear,  bilateral ears with cerumen impaction, cleared out in the office. No erythema, swelling, or exudate on post pharynx.  Tonsils not swollen or erythematous. Hearing normal.  Neck: Supple, thyroid normal.  Respiratory: Respiratory effort normal, BS equal bilaterally without rales, rhonchi, wheezing or stridor.  Cardio: RRR with no MRGs. Brisk peripheral pulses without edema.  Abdomen: Soft, + BS,  Non tender, no guarding, rebound, hernias, masses. Lymphatics: Non tender without lymphadenopathy.  Musculoskeletal: Full ROM, 5/5 strength, Normal gait Skin: Warm, dry without rashes, lesions, ecchymosis.  Neuro: Cranial nerves intact. Normal muscle tone, no cerebellar symptoms. Psych: Awake and oriented X 3, normal affect, Insight and Judgment appropriate.    Vicie Mutters, PA-C 11:03 AM [facility]  [facility]

## 2016-08-24 NOTE — Addendum Note (Signed)
Addended by: Vicie Mutters R on: 08/24/2016 12:07 PM   Modules accepted: Orders

## 2016-08-25 LAB — LIPID PANEL
Cholesterol: 168 mg/dL (ref <200)
HDL: 59 mg/dL (ref >50)
LDL Cholesterol: 83 mg/dL (ref <100)
Total CHOL/HDL Ratio: 2.8 Ratio (ref <5.0)
Triglycerides: 131 mg/dL (ref <150)
VLDL: 26 mg/dL (ref <30)

## 2016-08-25 LAB — CBC WITH DIFFERENTIAL/PLATELET
Basophils Absolute: 80 cells/uL (ref 0–200)
Basophils Relative: 1 %
Eosinophils Absolute: 400 cells/uL (ref 15–500)
Eosinophils Relative: 5 %
HCT: 46.9 % — ABNORMAL HIGH (ref 35.0–45.0)
Hemoglobin: 15.2 g/dL (ref 11.7–15.5)
Lymphocytes Relative: 33 %
Lymphs Abs: 2640 cells/uL (ref 850–3900)
MCH: 27 pg (ref 27.0–33.0)
MCHC: 32.4 g/dL (ref 32.0–36.0)
MCV: 83.2 fL (ref 80.0–100.0)
MPV: 10.3 fL (ref 7.5–12.5)
Monocytes Absolute: 640 cells/uL (ref 200–950)
Monocytes Relative: 8 %
Neutro Abs: 4240 cells/uL (ref 1500–7800)
Neutrophils Relative %: 53 %
Platelets: 446 10*3/uL — ABNORMAL HIGH (ref 140–400)
RBC: 5.64 MIL/uL — ABNORMAL HIGH (ref 3.80–5.10)
RDW: 15.5 % — ABNORMAL HIGH (ref 11.0–15.0)
WBC: 8 10*3/uL (ref 3.8–10.8)

## 2016-08-25 LAB — URINALYSIS, ROUTINE W REFLEX MICROSCOPIC
Bilirubin Urine: NEGATIVE
Glucose, UA: NEGATIVE
Hgb urine dipstick: NEGATIVE
Ketones, ur: NEGATIVE
Leukocytes, UA: NEGATIVE
Nitrite: NEGATIVE
Protein, ur: NEGATIVE
Specific Gravity, Urine: 1.022 (ref 1.001–1.035)
pH: 5 (ref 5.0–8.0)

## 2016-08-25 LAB — MAGNESIUM: Magnesium: 1.9 mg/dL (ref 1.5–2.5)

## 2016-08-25 LAB — HEPATIC FUNCTION PANEL
ALT: 15 U/L (ref 6–29)
AST: 22 U/L (ref 10–35)
Albumin: 4 g/dL (ref 3.6–5.1)
Alkaline Phosphatase: 86 U/L (ref 33–130)
Bilirubin, Direct: 0.1 mg/dL (ref <=0.2)
Indirect Bilirubin: 0.6 mg/dL (ref 0.2–1.2)
Total Bilirubin: 0.7 mg/dL (ref 0.2–1.2)
Total Protein: 7.2 g/dL (ref 6.1–8.1)

## 2016-08-25 LAB — BASIC METABOLIC PANEL WITH GFR
BUN: 19 mg/dL (ref 7–25)
CO2: 20 mmol/L (ref 20–31)
Calcium: 9.9 mg/dL (ref 8.6–10.4)
Chloride: 105 mmol/L (ref 98–110)
Creat: 0.88 mg/dL (ref 0.60–0.93)
GFR, Est African American: 74 mL/min (ref >=60)
GFR, Est Non African American: 64 mL/min (ref >=60)
Glucose, Bld: 84 mg/dL (ref 65–99)
Potassium: 4.9 mmol/L (ref 3.5–5.3)
Sodium: 141 mmol/L (ref 135–146)

## 2016-08-25 LAB — URINE CULTURE: Organism ID, Bacteria: NO GROWTH

## 2016-08-28 ENCOUNTER — Telehealth: Payer: Self-pay | Admitting: Hematology and Oncology

## 2016-08-28 ENCOUNTER — Ambulatory Visit (HOSPITAL_BASED_OUTPATIENT_CLINIC_OR_DEPARTMENT_OTHER): Payer: Medicare Other | Admitting: Hematology and Oncology

## 2016-08-28 DIAGNOSIS — Z86718 Personal history of other venous thrombosis and embolism: Secondary | ICD-10-CM | POA: Diagnosis not present

## 2016-08-28 DIAGNOSIS — I2602 Saddle embolus of pulmonary artery with acute cor pulmonale: Secondary | ICD-10-CM | POA: Diagnosis not present

## 2016-08-28 MED ORDER — APIXABAN 2.5 MG PO TABS: 2.5000 mg | ORAL_TABLET | Freq: Two times a day (BID) | ORAL | 11 refills | Status: DC

## 2016-08-28 NOTE — Telephone Encounter (Signed)
GAVE PATIENT AVS REPORT AND APPOINTMENTS FOR January 2019.

## 2016-08-29 ENCOUNTER — Encounter: Payer: Self-pay | Admitting: Hematology and Oncology

## 2016-08-29 NOTE — Progress Notes (Signed)
Ring[place], MD SUMMARY OF HEMATOLOGIC HISTORY:  Kimberly Watson  Was referred here after recent diagnosis of severe left lower extremity DVT with bilateral PE and cor pulmonale. I reviewed her records extensively and collaborated the history with the patient. The patient has been taking Evista for over 25 years due to strong family history of breast cancer and for osteoporosis management. In January 2017, she had viral gastroenteritis with severe diarrhea and dehydration. The patient has been taking Lasix chronically for mild bilateral lower extremity edema, left greater than the right. 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 She recalled having profuse diarrhea nonstop for 24 hours leading up to the blood clot event. Of note, prior to the diagnosis, she was not taking aspirin consistently  Prior to that she denies recent history of trauma, long distance travel, recent surgery, smoking or prolonged immobilization. She had no prior history or diagnosis of cancer. Her age appropriate screening programs are up-to-date. She had prior surgeries before and never had perioperative thromboembolic events. The patient had been exposed to birth control pills and never had thrombotic events. The patient had been pregnant before and denies history of peripartum thromboembolic event or history of recurrent miscarriages. There is no family history of blood clots or miscarriages.  On 08/18/2015, she presented to the emergency department due to symptoms of severe weakness, chest pressure, shortness of breath and near syncopal episode. She had extensive evaluation in the emergency room. D-dimer was elevated at greater than 20.  CT angiogram dated 08/19/2015 show massive saddle pulmonary embolus with evidence of severe right heart strain.  Left lower extremity venous Doppler on 08/19/2015 showed  findings consistent with acute deep vein thrombosis involving the left femoral vein, left popliteal vein, left posterior tibial vein and left peroneal vein.  Echocardiogram on 08/19/2015 show preserved ejection fraction but with right ventricular dilatation, moderate tricuspid valvular regurgitation, oscillating density on TV(thought to be blood clot in transit) and moderate to severe increased right systolic pressure/evidence of severe pulmonary hypertension  The patient was admitted with IV heparin anticoagulation therapy and placement of IVC filter on 08/20/2015.  Echocardiogram was repeated on 08/23/2015 which showed persistent elevated pulmonary hypertension. The mass over the right tricuspid valve is no longer visible She was hospitalized on 08/18/2015 to 08/24/2015. She was discharged home on Eliquis.  Evista was discontinued. On 12/07/2015, IVC filter has been removed  INTERVAL HISTORY: Kimberly Watson 76 y.o. female returns for further follow-up. She is doing very well. She continues to have mild chronic right lower extremity edema but it does not bother her. Overall, she felt that she is not very active. The patient denies any recent signs or symptoms of bleeding such as spontaneous epistaxis, hematuria or hematochezia. She denies recent chest pain or shortness of breath  I have reviewed the past medical history, past surgical history, social history and family history with the patient and they are unchanged from previous note.  ALLERGIES:  is allergic to evista [raloxifene] and fosamax [alendronate sodium].  MEDICATIONS:  Current Outpatient Prescriptions  Medication Sig Dispense Refill  . apixaban (ELIQUIS) 2.5 MG TABS tablet Take 1 tablet (2.5 mg total) by mouth 2 (two) times daily. 90 tablet 11  . calcium carbonate (OS-CAL) 600 MG TABS tablet Take 600 mg by mouth daily.    . Cholecalciferol (VITAMIN D PO) Take 2,000 Units by mouth. Takes 8000 units daily.    Marland Kitchen  clobetasol  ointment (TEMOVATE) 0.05 % Apply topically 2 (two) times daily. (Patient taking differently: Apply 1 application topically as needed. ) 45 g 1  . furosemide (LASIX) 40 MG tablet TAKE 1 TABLET (40 MG TOTAL) BY MOUTH DAILY. 30 tablet 3  . MAGNESIUM PO Take 1 tablet by mouth daily.    . metoprolol succinate (TOPROL-XL) 100 MG 24 hr tablet TAKE 1 TABLET BY MOUTH EVERY DAY TAKE WITH OR IMMEDIATELY AFTER A MEAL 90 tablet 1  . nystatin (MYCOSTATIN/NYSTOP) 100000 UNIT/GM POWD Apply to affected area after a shower daily or as needed 60 g 2  . OVER THE COUNTER MEDICATION Used OTC nasal spray, Ayr,     No current facility-administered medications for this visit.      REVIEW OF SYSTEMS:   Constitutional: Denies fevers, chills or night sweats Eyes: Denies blurriness of vision Ears, nose, mouth, throat, and face: Denies mucositis or sore throat Respiratory: Denies cough, dyspnea or wheezes Cardiovascular: Denies palpitation, chest discomfort  Gastrointestinal:  Denies nausea, heartburn or change in bowel habits Skin: Denies abnormal skin rashes Lymphatics: Denies new lymphadenopathy or easy bruising Neurological:Denies numbness, tingling or new weaknesses Behavioral/Psych: Mood is stable, no new changes  All other systems were reviewed with the patient and are negative.  PHYSICAL EXAMINATION: ECOG PERFORMANCE STATUS: 1 - Symptomatic but completely ambulatory  Vitals:   08/28/16 1352  BP: (!) 146/97  Pulse: 79  Resp: 17  Temp: 97.8 F (36.6 C)   Filed Weights   08/28/16 1352  Weight: 177 lb (80.3 kg)    GENERAL:alert, no distress and comfortable SKIN: skin color, texture, turgor are normal, no rashes or significant lesions EYES: normal, Conjunctiva are pink and non-injected, sclera clear OROPHARYNX:no exudate, no erythema and lips, buccal mucosa, and tongue normal  NECK: supple, thyroid normal size, non-tender, without nodularity LYMPH:  no palpable lymphadenopathy in the cervical,  axillary or inguinal LUNGS: clear to auscultation and percussion with normal breathing effort HEART: regular rate & rhythm and no murmurs With mild right lower extremity edema ABDOMEN:abdomen soft, non-tender and normal bowel sounds Musculoskeletal:no cyanosis of digits and no clubbing  NEURO: alert & oriented x 3 with fluent speech, no focal motor/sensory deficits  LABORATORY DATA:  I have reviewed the data as listed     Component Value Date/Time   NA 141 08/24/2016 1158   K 4.9 08/24/2016 1158   CL 105 08/24/2016 1158   CO2 20 08/24/2016 1158   GLUCOSE 84 08/24/2016 1158   BUN 19 08/24/2016 1158   CREATININE 0.88 08/24/2016 1158   CALCIUM 9.9 08/24/2016 1158   PROT 7.2 08/24/2016 1158   ALBUMIN 4.0 08/24/2016 1158   AST 22 08/24/2016 1158   ALT 15 08/24/2016 1158   ALKPHOS 86 08/24/2016 1158   BILITOT 0.7 08/24/2016 1158   GFRNONAA 64 08/24/2016 1158   GFRAA 74 08/24/2016 1158    No results found for: SPEP, UPEP  Lab Results  Component Value Date   WBC 8.0 08/24/2016   NEUTROABS 4,240 08/24/2016   HGB 15.2 08/24/2016   HCT 46.9 (H) 08/24/2016   MCV 83.2 08/24/2016   PLT 446 (H) 08/24/2016      Chemistry      Component Value Date/Time   NA 141 08/24/2016 1158   K 4.9 08/24/2016 1158   CL 105 08/24/2016 1158   CO2 20 08/24/2016 1158   BUN 19 08/24/2016 1158   CREATININE 0.88 08/24/2016 1158      Component Value Date/Time  CALCIUM 9.9 08/24/2016 1158   ALKPHOS 86 08/24/2016 1158   AST 22 08/24/2016 1158   ALT 15 08/24/2016 1158   BILITOT 0.7 08/24/2016 1158       ASSESSMENT & PLAN:  Acute saddle pulmonary embolism with acute cor pulmonale (HCC) The patient has completed a years worth of treatment for history of DVT and PE. IVC filter has been removed. The patient remain at high risk of DVT given her poor mobility. She tolerated chronic anticoagulation therapy well with no problems with bleeding. I share with her data with extended use of chronic  anticoagulation therapy for prevention of blood clots. I explain to the patient the rational to consider extended duration of treatment with low dose Apixaban. This is based on a study published in NEJM  Apixaban for Extended Treatment of Venous Thromboembolism Vangie Bicker, M.D., Lorriane Shire, M.D., Ph.D., Fuller Plan, M.D., Gretta Arab, D.V.M., Vilinda Flake, M.D., Dorie Rank, M.D., Bryon Lions, Ph.D., Pharm.D., Franki Monte, Ph.D., and Elie Confer. [place], M.D., for the [employer] Investigators* [employer] 2013; 368:699-708February 21, 2013DOI: 10.1056/[id number]  Methods In this randomized, double-blind study, we compared two doses of apixaban (2.5 mg and 5 mg, twice daily) with placebo in patients with venous thromboembolism who had completed 6 to 12 months of anticoagulation therapy and for whom there was clinical equipoise regarding the continuation or cessation of anticoagulation therapy. The study drugs were administered for 12 months  Results A total of 2486 patients underwent randomization, of whom 2482 were included in the intention-to-treat analyses. Symptomatic recurrent venous thromboembolism or death from venous thromboembolism occurred in 73 of the 829 patients (8.8%) who were receiving placebo, as compared with 14 of the 840 patients (1.7%) who were receiving 2.5 mg of apixaban (a difference of 7.2 percentage points; 95% confidence interval [CI], 5.0 to 9.3) and 14 of the 813 patients (1.7%) who were receiving 5 mg of apixaban (a difference of 7.0 percentage points; 95% CI, 4.9 to 9.1) (P<0.001 for both comparisons). The rates of major bleeding were 0.5% in the placebo group, 0.2% in the 2.5-mg apixaban group, and 0.1% in the 5-mg apixaban group. The rates of clinically relevant nonmajor bleeding were 2.3% in the placebo group, 3.0% in the 2.5-mg apixaban group, and 4.2% in the 5-mg apixaban group. The rate of death from any cause was 1.7% in the placebo  group, as compared with 0.8% in the 2.5-mg apixaban group and 0.5% in the 5-mg apixaban group.  Conclusions Extended anticoagulation with apixaban at either a treatment dose (5 mg) or a thromboprophylactic dose (2.5 mg) reduced the risk of recurrent venous thromboembolism without increasing the rate of major bleeding.  After reviewing the risk and benefit of continuing anticoagulation therapy, she is in favor of remaining on anticoagulation therapy for at least 1 more year. I refill her prescription of Apixaban at lower dose 2.5 mg twice a day I plan to see her back in one year to discuss possibility of discontinuation of treatment then. I recommend she follows with her primary care doctor at least twice a year for CBC and renal function monitoring   No orders of the defined types were placed in this encounter.   All questions were answered. The patient knows to call the clinic with any problems, questions or concerns. No barriers to learning was detected.  I spent 20 minutes counseling the patient face to face. The total time spent in the appointment was 25 minutes and more than 50% was  on counseling.     [name], MD 1/30/20188:41 AM

## 2016-08-29 NOTE — Assessment & Plan Note (Signed)
The patient has completed a years worth of treatment for history of DVT and PE. IVC filter has been removed. The patient remain at high risk of DVT given her poor mobility. She tolerated chronic anticoagulation therapy well with no problems with bleeding. I share with her data with extended use of chronic anticoagulation therapy for prevention of blood clots. I explain to the patient the rational to consider extended duration of treatment with low dose Apixaban. This is based on a study published in NEJM  Apixaban for Extended Treatment of Venous Thromboembolism Vangie Bicker, M.D., Lorriane Shire, M.D., Ph.D., Fuller Plan, M.D., Gretta Arab, D.V.M., Vilinda Flake, M.D., Dorie Rank, M.D., Bryon Lions, Ph.D., Pharm.D., Franki Monte, Ph.D., and Elie Confer. [place], M.D., for the [employer] Investigators* [employer] 2013; 368:699-708February 21, 2013DOI: 10.1056/[id number]  Methods In this randomized, double-blind study, we compared two doses of apixaban (2.5 mg and 5 mg, twice daily) with placebo in patients with venous thromboembolism who had completed 6 to 12 months of anticoagulation therapy and for whom there was clinical equipoise regarding the continuation or cessation of anticoagulation therapy. The study drugs were administered for 12 months  Results A total of 2486 patients underwent randomization, of whom 2482 were included in the intention-to-treat analyses. Symptomatic recurrent venous thromboembolism or death from venous thromboembolism occurred in 73 of the 829 patients (8.8%) who were receiving placebo, as compared with 14 of the 840 patients (1.7%) who were receiving 2.5 mg of apixaban (a difference of 7.2 percentage points; 95% confidence interval [CI], 5.0 to 9.3) and 14 of the 813 patients (1.7%) who were receiving 5 mg of apixaban (a difference of 7.0 percentage points; 95% CI, 4.9 to 9.1) (P<0.001 for both comparisons). The rates of major bleeding were  0.5% in the placebo group, 0.2% in the 2.5-mg apixaban group, and 0.1% in the 5-mg apixaban group. The rates of clinically relevant nonmajor bleeding were 2.3% in the placebo group, 3.0% in the 2.5-mg apixaban group, and 4.2% in the 5-mg apixaban group. The rate of death from any cause was 1.7% in the placebo group, as compared with 0.8% in the 2.5-mg apixaban group and 0.5% in the 5-mg apixaban group.  Conclusions Extended anticoagulation with apixaban at either a treatment dose (5 mg) or a thromboprophylactic dose (2.5 mg) reduced the risk of recurrent venous thromboembolism without increasing the rate of major bleeding.  After reviewing the risk and benefit of continuing anticoagulation therapy, she is in favor of remaining on anticoagulation therapy for at least 1 more year. I refill her prescription of Apixaban at lower dose 2.5 mg twice a day I plan to see her back in one year to discuss possibility of discontinuation of treatment then. I recommend she follows with her primary care doctor at least twice a year for CBC and renal function monitoring

## 2016-08-30 ENCOUNTER — Inpatient Hospital Stay (HOSPITAL_COMMUNITY): Admission: RE | Admit: 2016-08-30 | Payer: Medicare Other | Source: Ambulatory Visit

## 2016-08-31 IMAGING — US IR IVC FILTER RETRIEVAL / S&I /IMG GUID/MOD SED
1 series · 1 of 1 positions shown · non-contrast
Comparison: None.

INDICATION: History of acute saddle pulmonary embolism and left lower extremity
DVT, post IVC filter placement on 08/20/2015 by Dr. Jim.
TECHNIQUE: Informed written consent was obtained from the patient after a
discussion of the risks, benefits and alternatives to treatment.
Questions regarding the procedure were encouraged and answered. A
timeout was performed prior to the initiation of the procedure.

[Series 1: ir (id) (id)/(id)/(id) ir · 1 of 1 slices shown]
[im 1/1]
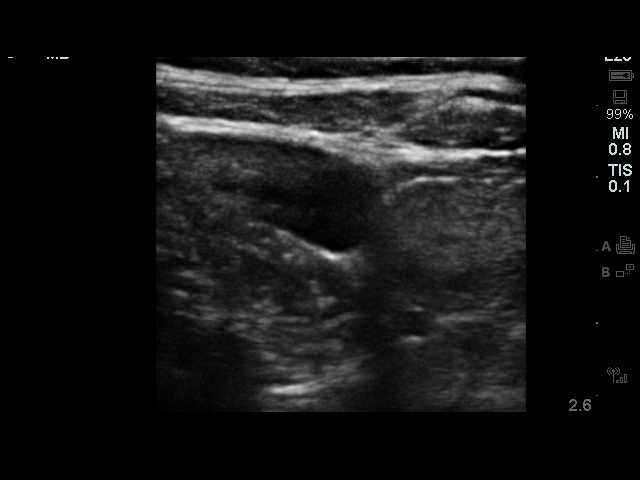

[1 of 1 positions shown; findings below may reference images not displayed]

Patient was seen in follow-up consultation both at the
[REDACTED] on 10/14/2015 and subsequently by
providing hematologist, Dr. Ange Billy and the decision was made to
remove the patient's temporary IVC filter.

EXAM:
1. IR IVC FILTER RETRIEVAL/S+I/ IMAGE GUIDE MODERATE SEDATION

2. IR ULTRASOUND GUIDANCE VASC ACCESS
MEDICATIONS:
None

ANESTHESIA/SEDATION:
Moderate (conscious) sedation was employed during this procedure. A
total of Versed 2 mg and Fentanyl 25 mcg was administered
intravenously.

Moderate Sedation Time: 25 minutes. The patient's level of
consciousness and vital signs were monitored continuously by
radiology nursing throughout the procedure under my direct
supervision.

FLUOROSCOPY TIME:  4 minutes, 24 seconds (138 mGy)

CONTRAST:  35mL [id number] IOPAMIDOL ([id number]-[id number]) INJECTION 61%
The right neck was prepped and draped in the usual sterile fashion,
and a sterile drape was applied covering the operative field.
Maximum barrier sterile technique with sterile gowns and gloves were
used for the procedure. A timeout was performed prior to the
initiation of the procedure. Local anesthesia was provided with 1%
lidocaine.

Under direct ultrasound guidance, the right internal jugular vein
was accessed with a micropuncture needle at the overlying soft
tissues were anesthetized with 1% lidocaine. An ultrasound image was
saved for documentation purposes. This allowed for placement of a
5-French vascular sheath. With the use of a Benson wire, a pigtail
catheter was advanced to the level of the caudal IVC and an inferior
venacavagram was performed.

The vascular sheath was exchanged for an 11 [place] IVC filter
retrieval sheath. The hook of the filter was successfully snared and
the filter was withdrawn intact into the co-axial 11-French sheath.
A completion inferior venacavagram was performed.

At this point, the procedure was terminated. All wires, catheters
and sheaths were removed from the patient. Hemostasis was achieved
at the right neck access site with manual compression. A dressing
was placed. The patient tolerated the above procedure well without
immediate postprocedural complication.
FINDINGS: Inferior venacavagram demonstrates wide patency of the IVC. There is
no thrombus within the infrarenal IVC filter which appears unchanged
in position from the time of initial placement.

The IVC filter was successfully removed using a snare device as
detailed above.

Completion inferior venacavagram was negative for caval injury.
IMPRESSION: 1. Successful fluoroscopic guided removal of infrarenal IVC filter.

2. Normal inferior venacavagram.

## 2016-09-02 DIAGNOSIS — B349 Viral infection, unspecified: Secondary | ICD-10-CM | POA: Diagnosis not present

## 2016-09-02 DIAGNOSIS — R070 Pain in throat: Secondary | ICD-10-CM | POA: Diagnosis not present

## 2016-09-02 DIAGNOSIS — R05 Cough: Secondary | ICD-10-CM | POA: Diagnosis not present

## 2016-09-02 DIAGNOSIS — R509 Fever, unspecified: Secondary | ICD-10-CM | POA: Diagnosis not present

## 2016-09-22 ENCOUNTER — Ambulatory Visit (HOSPITAL_COMMUNITY)
Admission: RE | Admit: 2016-09-22 | Discharge: 2016-09-22 | Disposition: A | Discharge status: Home / Self Care | Payer: Medicare Other | Source: Ambulatory Visit | Attending: Vascular Surgery | Admitting: Vascular Surgery

## 2016-09-22 ENCOUNTER — Other Ambulatory Visit: Payer: Self-pay | Admitting: Physician Assistant

## 2016-09-22 DIAGNOSIS — M7989 Other specified soft tissue disorders: Principal | ICD-10-CM

## 2016-09-22 DIAGNOSIS — M79606 Pain in leg, unspecified: Secondary | ICD-10-CM | POA: Diagnosis not present

## 2016-10-13 ENCOUNTER — Other Ambulatory Visit: Payer: Self-pay | Admitting: Internal Medicine

## 2016-12-22 ENCOUNTER — Ambulatory Visit (INDEPENDENT_AMBULATORY_CARE_PROVIDER_SITE_OTHER): Payer: Medicare Other | Admitting: Internal Medicine

## 2016-12-22 VITALS — BP 122/80 | HR 68 | Temp 97.3°F | Resp 16 | Ht 62.5 in | Wt 175.0 lb

## 2016-12-22 DIAGNOSIS — Z79899 Other long term (current) drug therapy: Secondary | ICD-10-CM

## 2016-12-22 DIAGNOSIS — I10 Essential (primary) hypertension: Secondary | ICD-10-CM

## 2016-12-22 DIAGNOSIS — R7303 Prediabetes: Secondary | ICD-10-CM | POA: Diagnosis not present

## 2016-12-22 DIAGNOSIS — E782 Mixed hyperlipidemia: Secondary | ICD-10-CM | POA: Diagnosis not present

## 2016-12-22 DIAGNOSIS — E559 Vitamin D deficiency, unspecified: Secondary | ICD-10-CM

## 2016-12-22 LAB — CBC WITH DIFFERENTIAL/PLATELET
Basophils Absolute: 154 cells/uL (ref 0–200)
Basophils Relative: 2 %
Eosinophils Absolute: 308 cells/uL (ref 15–500)
Eosinophils Relative: 4 %
HCT: 47 % — ABNORMAL HIGH (ref 35.0–45.0)
Hemoglobin: 15.3 g/dL (ref 11.7–15.5)
Lymphocytes Relative: 33 %
Lymphs Abs: 2541 cells/uL (ref 850–3900)
MCH: 27.5 pg (ref 27.0–33.0)
MCHC: 32.6 g/dL (ref 32.0–36.0)
MCV: 84.4 fL (ref 80.0–100.0)
MPV: 10 fL (ref 7.5–12.5)
Monocytes Absolute: 616 cells/uL (ref 200–950)
Monocytes Relative: 8 %
Neutro Abs: 4081 cells/uL (ref 1500–7800)
Neutrophils Relative %: 53 %
Platelets: 485 10*3/uL — ABNORMAL HIGH (ref 140–400)
RBC: 5.57 MIL/uL — ABNORMAL HIGH (ref 3.80–5.10)
RDW: 15.6 % — ABNORMAL HIGH (ref 11.0–15.0)
WBC: 7.7 10*3/uL (ref 3.8–10.8)

## 2016-12-22 LAB — TSH: TSH: 1.3 mIU/L

## 2016-12-22 NOTE — Progress Notes (Signed)
This very nice 76 y.o. DWF presents for 3 month follow up with Hypertension, Hyperlipidemia, Pre-Diabetes and Vitamin D Deficiency. Other problems include hx/o Provoked LLE DVT & Saddle PE in Jan 2017 attributed to Evista and for which she continues on Eliquis at least thru Jan 2019 per Dr Alvy Bimler.      Patient has hx/o  HTN & BP has been controlled at home. Today's BP is at goal- 122/80. Patient has had no complaints of any cardiac type chest pain, palpitations, dyspnea/orthopnea/PND, dizziness, claudication, or dependent edema.     Hyperlipidemia is controlled with diet. Patient denies myalgias or other med SE's. Last Lipids were at goal: Lab Results  Component Value Date   CHOL 168 08/24/2016   HDL 59 08/24/2016   LDLCALC 83 08/24/2016   TRIG 131 08/24/2016   CHOLHDL 2.8 08/24/2016      Also, the patient has history of PreDiabetes/Insulin Resistance  and has had no symptoms of reactive hypoglycemia, diabetic polys, paresthesias or visual blurring.  Last A1c was at goal: Lab Results  Component Value Date   HGBA1C 5.2 04/24/2016      Further, the patient also has history of Vitamin D Deficiency and supplements vitamin D without any suspected side-effects. Last vitamin D was at goal: Lab Results  Component Value Date   VD25OH 81 01/19/2016   Current Outpatient Prescriptions on File Prior to Visit  Medication Sig  . apixaban (ELIQUIS) 2.5 MG TABS tablet Take 1 tablet (2.5 mg total) by mouth 2 (two) times daily.  . calcium carbonate (OS-CAL) 600 MG TABS tablet Take 600 mg by mouth daily.  . Cholecalciferol (VITAMIN D PO) Take 2,000 Units by mouth. Takes 8000 units daily.  . clobetasol ointment (TEMOVATE) 0.05 % Apply topically 2 (two) times daily. (Patient taking differently: Apply 1 application topically as needed. )  . furosemide (LASIX) 40 MG tablet TAKE 1 TABLET (40 MG TOTAL) BY MOUTH DAILY.  Marland Kitchen MAGNESIUM PO Take 1 tablet by mouth daily.  . metoprolol succinate (TOPROL-XL) 100  MG 24 hr tablet TAKE 1 TABLET BY MOUTH EVERY DAY TAKE WITH OR IMMEDIATELY AFTER A MEAL  . nystatin (MYCOSTATIN/NYSTOP) 100000 UNIT/GM POWD Apply to affected area after a shower daily or as needed  . OVER THE COUNTER MEDICATION Used OTC nasal spray, Ayr,   No current facility-administered medications on file prior to visit.    Allergies  Allergen Reactions  . Evista [Raloxifene] Other (See Comments)    Blood clots - DVT and Pulmonary Embolism   . Fosamax [Alendronate Sodium]     esophagitis   PMHx:   Past Medical History:  Diagnosis Date  . Cataract   . DVT of lower extremity (deep venous thrombosis) ([place])    left, resolved  . Elevated hemoglobin A1c   . GERD (gastroesophageal reflux disease)   . H/O left knee surgery   . Hypertension   . Leiomyoma    adnexal Leiomyoma removal in 1/11  . Nephrolithiasis   . Osteoporosis   . PE (pulmonary embolism) 08/2015   Immunization History  Administered Date(s) Administered  . DT 03/30/2015  . Pneumococcal Conjugate-13 03/25/2014  . Zoster 12/27/2005   Past Surgical History:  Procedure Laterality Date  . CHOLECYSTECTOMY    . IVC FILTER PLACEMENT ([facility] HX)  08/2015  . JOINT REPLACEMENT Left total knee  . UTERINE FIBROID SURGERY     FHx:    Reviewed / unchanged  SHx:    Reviewed / unchanged  Systems Review:  Constitutional: Denies fever, chills, wt changes, headaches, insomnia, fatigue, night sweats, change in appetite. Eyes: Denies redness, blurred vision, diplopia, discharge, itchy, watery eyes.  ENT: Denies discharge, congestion, post nasal drip, epistaxis, sore throat, earache, hearing loss, dental pain, tinnitus, vertigo, sinus pain, snoring.  CV: Denies chest pain, palpitations, irregular heartbeat, syncope, dyspnea, diaphoresis, orthopnea, PND, claudication or edema. Respiratory: denies cough, dyspnea, DOE, pleurisy, hoarseness, laryngitis, wheezing.  Gastrointestinal: Denies dysphagia, odynophagia, heartburn, reflux, water  brash, abdominal pain or cramps, nausea, vomiting, bloating, diarrhea, constipation, hematemesis, melena, hematochezia  or hemorrhoids. Genitourinary: Denies dysuria, frequency, urgency, nocturia, hesitancy, discharge, hematuria or flank pain. Musculoskeletal: Denies arthralgias, myalgias, stiffness, jt. swelling, pain, limping or strain/sprain.  Skin: Denies pruritus, rash, hives, warts, acne, eczema or change in skin lesion(s). Neuro: No weakness, tremor, incoordination, spasms, paresthesia or pain. Psychiatric: Denies confusion, memory loss or sensory loss. Endo: Denies change in weight, skin or hair change.  Heme/Lymph: No excessive bleeding, bruising or enlarged lymph nodes.  Physical Exam  BP 122/80   Pulse 68   Temp 97.3 F (36.3 C)   Resp 16   Ht 5' 2.5" (1.588 m)   Wt 175 lb (79.4 kg)   BMI 31.50 kg/m   Appears well nourished, well groomed  and in no distress.  Eyes: PERRLA, EOMs, conjunctiva no swelling or erythema. Sinuses: No frontal/maxillary tenderness ENT/Mouth: EAC's clear, TM's nl w/o erythema, bulging. Nares clear w/o erythema, swelling, exudates. Oropharynx clear without erythema or exudates. Oral hygiene is good. Tongue normal, non obstructing. Hearing intact.  Neck: Supple. Thyroid nl. Car 2+/2+ without bruits, nodes or JVD. Chest: Respirations nl with BS clear & equal w/o rales, rhonchi, wheezing or stridor.  Cor: Heart sounds normal w/ regular rate and rhythm without sig. murmurs, gallops, clicks or rubs. Peripheral pulses normal and equal  without edema.  Abdomen: Soft & bowel sounds normal. Non-tender w/o guarding, rebound, hernias, masses or organomegaly.  Lymphatics: Unremarkable.  Musculoskeletal: Full ROM all peripheral extremities, joint stability, 5/5 strength and normal gait.  Skin: Warm, dry without exposed rashes, lesions or ecchymosis apparent.  Neuro: Cranial nerves intact, reflexes equal bilaterally. Sensory-motor testing grossly intact. Tendon  reflexes grossly intact.  Pysch: Alert & oriented x 3.  Insight and judgement nl & appropriate. No ideations.  Assessment and Plan:  1. Essential hypertension  - Continue medication, monitor blood pressure at home.  - Continue DASH diet. Reminder to go to the ER if any CP,  SOB, nausea, dizziness, severe HA, changes vision/speech,  left arm numbness and tingling and jaw pain.  - CBC with Differential/Platelet - BASIC METABOLIC PANEL WITH GFR - Magnesium - TSH  2. Hyperlipidemia, mixed  - Continue diet/meds, exercise,& lifestyle modifications.   - Continue monitor periodic cholesterol/liver & renal functions  - Hepatic function panel - Lipid panel - TSH  3. Prediabetes  - Continue diet, exercise, lifestyle modifications.  - Monitor appropriate labs.  - Hemoglobin A1c - Insulin, random  4. Vitamin D deficiency  - Continue supplementation. - VITAMIN D 25 Hydroxy  5. Medication management  - CBC with Differential/Platelet - BASIC METABOLIC PANEL WITH GFR - Hepatic function panel - Magnesium - Lipid panel - TSH - Hemoglobin A1c - Insulin, random - VITAMIN D 25 Hydroxy       Discussed  regular exercise, BP monitoring, weight control to achieve/maintain BMI less than 25 and discussed med and SE's. Recommended labs to assess and monitor clinical status with further disposition pending results of labs.  Over 30 minutes of exam, counseling, chart review was performed.

## 2016-12-22 NOTE — Patient Instructions (Signed)

## 2016-12-23 ENCOUNTER — Encounter: Payer: Self-pay | Admitting: Internal Medicine

## 2016-12-23 LAB — LIPID PANEL
Cholesterol: 154 mg/dL (ref <200)
HDL: 63 mg/dL (ref >50)
LDL Cholesterol: 73 mg/dL (ref <100)
Total CHOL/HDL Ratio: 2.4 Ratio (ref <5.0)
Triglycerides: 90 mg/dL (ref <150)
VLDL: 18 mg/dL (ref <30)

## 2016-12-23 LAB — HEMOGLOBIN A1C
Hgb A1c MFr Bld: 5.4 % (ref <5.7)
Mean Plasma Glucose: 108 mg/dL

## 2016-12-23 LAB — BASIC METABOLIC PANEL WITH GFR
BUN: 16 mg/dL (ref 7–25)
CO2: 22 mmol/L (ref 20–31)
Calcium: 9.8 mg/dL (ref 8.6–10.4)
Chloride: 103 mmol/L (ref 98–110)
Creat: 0.82 mg/dL (ref 0.60–0.93)
GFR, Est African American: 80 mL/min (ref >=60)
GFR, Est Non African American: 70 mL/min (ref >=60)
Glucose, Bld: 83 mg/dL (ref 65–99)
Potassium: 4.3 mmol/L (ref 3.5–5.3)
Sodium: 139 mmol/L (ref 135–146)

## 2016-12-23 LAB — HEPATIC FUNCTION PANEL
ALT: 16 U/L (ref 6–29)
AST: 21 U/L (ref 10–35)
Albumin: 4.1 g/dL (ref 3.6–5.1)
Alkaline Phosphatase: 83 U/L (ref 33–130)
Bilirubin, Direct: 0.3 mg/dL — ABNORMAL HIGH (ref <=0.2)
Indirect Bilirubin: 0.8 mg/dL (ref 0.2–1.2)
Total Bilirubin: 1.1 mg/dL (ref 0.2–1.2)
Total Protein: 7.2 g/dL (ref 6.1–8.1)

## 2016-12-23 LAB — MAGNESIUM: Magnesium: 1.7 mg/dL (ref 1.5–2.5)

## 2016-12-23 LAB — VITAMIN D 25 HYDROXY (VIT D DEFICIENCY, FRACTURES): Vit D, 25-Hydroxy: 46 ng/mL (ref 30–100)

## 2016-12-26 LAB — INSULIN, RANDOM: Insulin: 12.5 u[IU]/mL (ref 2.0–19.6)

## 2017-03-20 DIAGNOSIS — H02831 Dermatochalasis of right upper eyelid: Secondary | ICD-10-CM | POA: Diagnosis not present

## 2017-03-20 DIAGNOSIS — H02834 Dermatochalasis of left upper eyelid: Secondary | ICD-10-CM | POA: Diagnosis not present

## 2017-04-12 ENCOUNTER — Other Ambulatory Visit: Payer: Self-pay | Admitting: Physician Assistant

## 2017-04-24 ENCOUNTER — Other Ambulatory Visit: Payer: Self-pay | Admitting: Physician Assistant

## 2017-05-01 NOTE — Progress Notes (Signed)
Medicare wellness  Assessment:   Essential hypertension - continue medications, DASH diet, exercise and monitor at home. Call if greater than 130/80.  - CBC with Differential/Platelet - BASIC METABOLIC PANEL WITH GFR - Hepatic function panel - TSH - Urinalysis, Routine w reflex microscopic (not at [facility]) - Microalbumin / creatinine urine ratio - EKG 12-Lead  Mixed hyperlipidemia -continue medications, check lipids, decrease fatty foods, increase activity.  - Lipid panel  Prediabetes Discussed general issues about diabetes pathophysiology and management., Educational material distributed., Suggested low cholesterol diet., Encouraged aerobic exercise., Discussed foot care., Reminded to get yearly retinal exam. 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   Right knee pain Will refer to ortho for evaluation  Future Appointments Date Time Provider [place]  08/27/2017 1:30 PM [name], MD [facility] None  05/09/2018 10:00 AM Vicie Mutters, PA-C [facility] None     Plan:   During the course of the visit the patient was educated and counseled about appropriate screening and preventive services including:    Influenza vaccine- declines until next year  Screening electrocardiogram  Screening  mammography- yearly  Bone densitometry screening  Colorectal cancer screening  Diabetes screening- today  Glaucoma screening- yearly  Nutrition counseling   Advanced directives:    Subjective:   Kimberly Watson is a 76 y.o. female who presents for Medicare Annual Wellness Visit and 3 month follow up on hypertension, prediabetes, hyperlipidemia, vitamin D def.   Her blood pressure has been controlled at home, today their BP is BP: 120/80  She has joined a gym, recently has not been going but will start again. She denies chest pain, shortness of breath, dizziness. She has not had any shortness of breath, had pulmonary hypertension on echo due to PE, not symptoms at this time.  She has rash on left leg, follows with derm.  She does not sleep on her back but her brother and sister have OSA and she has had apnea in the hospital.  Her cholesterol is diet controlled.  Her cholesterol is controlled. The cholesterol last visit was:   Lab Results  Component Value Date   CHOL 154 12/22/2016   HDL 63 12/22/2016   LDLCALC 73 12/22/2016   TRIG 90 12/22/2016   CHOLHDL 2.4 12/22/2016   She has been working on diet and exercise for prediabetes, and has no polyuria or polydipsia, no chest pain, dyspnea or TIAs, no numbness, tingling or pain in extremities. Last A1C in the office was:  Lab Results  Component Value Date   HGBA1C 5.4 12/22/2016   Patient is on Vitamin D supplement.  She has history of osteoporosis, has been on reclast in the past, last DEXA showed unchanged.  She was on evista for osteoporosis, she was taken off due to submassive bilateral saddle PE with right heart strain. She has seen Dr. Alvy Bimler, was on eliquis x 1 year.  She has history of nephrolithiasis, follows with Dr. Karsten Ro.   She has been having right knee pain x 3-4 years, [place], would like referral to ortho. Worse with standing/bending, keeping her from working out and  working in her yard. Will take tylenol/aleve without relief.  BMI is Body mass index is 31.17 kg/m., she is working on diet and exercise. Wt Readings from Last 3 Encounters:  05/02/17 173 lb 3.2 oz (78.6 kg)  12/22/16 175 lb (79.4 kg)  08/28/16 177 lb (80.3 kg)    Names of Other Physician/Practitioners you currently use: 1. [facility]- here for primary care 2. Dr. Delman Cheadle, eye doctor, last visit Glasses/contacts, 2018 3. Judeth Horn Dentist, last visit every 6 months Patient Care Team: Unk Pinto, MD as PCP - General (Internal Medicine) Monna Fam, MD as Consulting Physician (Ophthalmology) Gatha Mayer, MD as Consulting Physician (Gastroenterology) Kathie Rhodes, MD as Consulting Physician (Urology) Meisinger, Sherren Mocha, MD as Consulting Physician (Obstetrics and Gynecology)  Medication Review Current Outpatient Prescriptions on File Prior to Visit  Medication Sig Dispense Refill  . apixaban (ELIQUIS) 2.5 MG TABS tablet Take 1 tablet (2.5 mg total) by mouth 2 (two) times daily. 90 tablet 11  . calcium carbonate (OS-CAL) 600 MG TABS tablet Take 600 mg by mouth daily.    . Cholecalciferol (VITAMIN D PO) Take 2,000 Units by mouth. Takes 8000 units daily.    . furosemide (LASIX) 40 MG tablet TAKE 1 TABLET (40 MG TOTAL) BY MOUTH DAILY. 90 tablet 1  . MAGNESIUM PO Take 1 tablet by mouth daily.    . metoprolol succinate (TOPROL-XL) 100 MG 24 hr tablet TAKE 1 TABLET BY MOUTH EVERY DAY TAKE WITH OR IMMEDIATELY AFTER A MEAL 90 tablet 1  . nystatin (MYCOSTATIN/NYSTOP) 100000 UNIT/GM POWD Apply to affected area after a shower daily or as needed 60 g 2  . OVER THE COUNTER MEDICATION Used OTC nasal spray, Ayr,     No current facility-administered medications on file prior to visit.     Current Problems (verified) Patient Active Problem List   Diagnosis Date Noted  . Prediabetes 12/22/2016  . Encounter for Medicare annual wellness exam 04/24/2016  .  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 DVT (deep venous thrombosis) ([place])   . Osteoporosis 03/30/2015  . Medication management 03/30/2015  . Morbid obesity ([place]) 03/30/2015  . Essential hypertension 06/18/2013  . Hyperlipidemia, mixed 06/18/2013  . Other abnormal glucose 06/18/2013  . Vitamin D deficiency 06/18/2013  . GERD (gastroesophageal reflux disease) 06/18/2013  . Nephrolithiasis 06/18/2013   Screening Tests Immunization History  Administered Date(s) Administered  . DT 03/30/2015  . Pneumococcal Conjugate-13 03/25/2014  . Zoster 12/27/2005    Preventative care: Last colonoscopy: 2005 due 2015, never got due to father passing Last mammogram: 03/2016 Last pap smear/pelvic exam: remote declines another DEXA: 04/2015 Osteopenia- had (reclast 08,09,11) DUE Echo 08/2015  Prior vaccinations: TD or Tdap: 2016 Influenza: 2018 TODAY Prevnar 13: 2015 Pneumococcal: 2008  Shingles/Zostavax: 2007  Allergies Allergies  Allergen Reactions  . Evista [Raloxifene] Other (See Comments)    Blood clots - DVT and Pulmonary Embolism   . Fosamax [Alendronate Sodium]     esophagitis    SURGICAL HISTORY She  has a past surgical history that includes Joint replacement (Left, total knee); Uterine fibroid surgery; Cholecystectomy; and IVC FILTER PLACEMENT ([facility] HX) (08/2015). FAMILY HISTORY Her family history includes Breast cancer in her mother; Congestive  Heart Failure in her mother; Diabetes in her mother; Hypertension in her mother; Osteoporosis in her father. SOCIAL HISTORY She  reports that she has never smoked. She has never used smokeless tobacco. She reports that she does not drink alcohol or use drugs.  MEDICARE WELLNESS OBJECTIVES: Physical activity:   Cardiac risk factors:   Depression/mood screen:   Depression screen [facility] 2/9 12/23/2016  Decreased Interest 0  Down, Depressed, Hopeless 0  PHQ - 2 Score 0    ADLs:  In your present state of  health, do you have any difficulty performing the following activities: 12/23/2016  Hearing? N  Vision? N  Difficulty concentrating or making decisions? N  Walking or climbing stairs? N  Dressing or bathing? N  Doing errands, shopping? N  Some recent data might be hidden     Cognitive Testing  Alert? Yes  Normal Appearance?Yes  Oriented to person? Yes  Place? Yes   Time? Yes  Recall of three objects?  Yes  Can perform simple calculations? Yes  Displays appropriate judgment?Yes  Can read the correct time from a watch face?Yes  EOL planning: Does Patient Have a Medical Advance Directive?: No Would patient like information on creating a medical advance directive?: Yes (MAU/Ambulatory/Procedural Areas - Information given)  Review of Systems  Constitutional: Negative.   HENT: Negative for congestion, ear discharge, ear pain, hearing loss, nosebleeds, sore throat and tinnitus.   Eyes: Negative.   Respiratory: Negative for cough, hemoptysis, sputum production, shortness of breath, wheezing and stridor.   Cardiovascular: Negative for chest pain, palpitations, orthopnea, claudication, leg swelling and PND.  Gastrointestinal: Negative for abdominal pain, blood in stool, constipation, diarrhea, heartburn, melena, nausea and vomiting.  Genitourinary: Negative.   Musculoskeletal: Positive for joint pain (right knee). Negative for back pain, falls, myalgias and neck pain.  Skin: Negative.   Neurological: Negative.  Negative for headaches.  Psychiatric/Behavioral: Negative.      Objective:   Blood pressure 120/80, pulse 65, temperature (!) 97.3 F (36.3 C), resp. rate 14, height 5' 2.5" (1.588 m), weight 173 lb 3.2 oz (78.6 kg), SpO2 96 %. Body mass index is 31.17 kg/m.  General appearance: alert, no distress, WD/WN,  female HEENT: normocephalic, sclerae anicteric, TMs pearly, nares patent, no discharge or erythema, pharynx normal Oral cavity: MMM, no lesions Neck: supple, no  lymphadenopathy, no thyromegaly, no masses Heart: RRR, normal S1, S2, no murmurs Lungs: CTA bilaterally, no wheezes, rhonchi, or rales Abdomen: +bs, soft, non tender, non distended, no masses, no hepatomegaly, no splenomegaly Musculoskeletal: nontender, no swelling, no obvious deformity,  Extremities: no edema, no cyanosis, no clubbing Pulses: 2+ symmetric, upper and lower extremities, normal cap refill Neurological: alert, oriented x 3, CN2-12 intact, strength normal upper extremities and lower extremities, sensation normal throughout, DTRs 2+ throughout, no cerebellar signs, gait antalgic Psychiatric: normal affect, behavior normal, pleasant  Breast: nontender, no masses or lumps, no skin changes, no nipple discharge or inversion, no axillary lymphadenopathy Gyn: defer  Rectal: defer  Medicare Attestation I have personally reviewed: The patient's medical and social history Their use of alcohol, tobacco or illicit drugs Their current medications and supplements The patient's functional ability including ADLs,fall risks, home safety risks, cognitive, and hearing and visual impairment Diet and physical activities Evidence for depression or mood disorders  The patient's weight, height, BMI, and visual acuity have been recorded in the chart.  I have made referrals, counseling, and provided education to the patient based on review of the above and I have  provided the patient with a written personalized care plan for preventive services.     Vicie Mutters, PA-C   05/02/2017

## 2017-05-02 ENCOUNTER — Ambulatory Visit (INDEPENDENT_AMBULATORY_CARE_PROVIDER_SITE_OTHER): Payer: Medicare Other | Admitting: Physician Assistant

## 2017-05-02 ENCOUNTER — Encounter: Payer: Self-pay | Admitting: Physician Assistant

## 2017-05-02 VITALS — BP 120/80 | HR 65 | Temp 97.3°F | Resp 14 | Ht 62.5 in | Wt 173.2 lb

## 2017-05-02 DIAGNOSIS — I10 Essential (primary) hypertension: Secondary | ICD-10-CM | POA: Diagnosis not present

## 2017-05-02 DIAGNOSIS — I2699 Other pulmonary embolism without acute cor pulmonale: Secondary | ICD-10-CM | POA: Diagnosis not present

## 2017-05-02 DIAGNOSIS — I2729 Other secondary pulmonary hypertension: Secondary | ICD-10-CM

## 2017-05-02 DIAGNOSIS — I82402 Acute embolism and thrombosis of unspecified deep veins of left lower extremity: Secondary | ICD-10-CM

## 2017-05-02 DIAGNOSIS — Z136 Encounter for screening for cardiovascular disorders: Secondary | ICD-10-CM

## 2017-05-02 DIAGNOSIS — Z79899 Other long term (current) drug therapy: Secondary | ICD-10-CM

## 2017-05-02 DIAGNOSIS — E782 Mixed hyperlipidemia: Secondary | ICD-10-CM

## 2017-05-02 DIAGNOSIS — R7309 Other abnormal glucose: Secondary | ICD-10-CM

## 2017-05-02 DIAGNOSIS — R7303 Prediabetes: Secondary | ICD-10-CM

## 2017-05-02 DIAGNOSIS — Z0000 Encounter for general adult medical examination without abnormal findings: Secondary | ICD-10-CM

## 2017-05-02 DIAGNOSIS — N200 Calculus of kidney: Secondary | ICD-10-CM

## 2017-05-02 DIAGNOSIS — G8929 Other chronic pain: Secondary | ICD-10-CM | POA: Diagnosis not present

## 2017-05-02 DIAGNOSIS — Z23 Encounter for immunization: Secondary | ICD-10-CM | POA: Diagnosis not present

## 2017-05-02 DIAGNOSIS — R6889 Other general symptoms and signs: Secondary | ICD-10-CM

## 2017-05-02 DIAGNOSIS — M25561 Pain in right knee: Secondary | ICD-10-CM

## 2017-05-02 DIAGNOSIS — E559 Vitamin D deficiency, unspecified: Secondary | ICD-10-CM

## 2017-05-02 DIAGNOSIS — Z0001 Encounter for general adult medical examination with abnormal findings: Secondary | ICD-10-CM | POA: Diagnosis not present

## 2017-05-02 DIAGNOSIS — M810 Age-related osteoporosis without current pathological fracture: Secondary | ICD-10-CM

## 2017-05-02 DIAGNOSIS — K219 Gastro-esophageal reflux disease without esophagitis: Secondary | ICD-10-CM

## 2017-05-02 LAB — TSH: TSH: 1.53 mIU/L (ref 0.40–4.50)

## 2017-05-02 LAB — BASIC METABOLIC PANEL WITH GFR
BUN/Creatinine Ratio: 21 (calc) (ref 6–22)
BUN: 20 mg/dL (ref 7–25)
CO2: 30 mmol/L (ref 20–32)
Calcium: 10.1 mg/dL (ref 8.6–10.4)
Chloride: 103 mmol/L (ref 98–110)
Creat: 0.94 mg/dL — ABNORMAL HIGH (ref 0.60–0.93)
GFR, Est African American: 68 mL/min/{1.73_m2} (ref >=60)
GFR, Est Non African American: 59 mL/min/{1.73_m2} — ABNORMAL LOW (ref >=60)
Glucose, Bld: 85 mg/dL (ref 65–99)
Potassium: 4.3 mmol/L (ref 3.5–5.3)
Sodium: 142 mmol/L (ref 135–146)

## 2017-05-02 LAB — HEPATIC FUNCTION PANEL
AG Ratio: 1.3 (calc) (ref 1.0–2.5)
ALT: 17 U/L (ref 6–29)
AST: 22 U/L (ref 10–35)
Albumin: 4.2 g/dL (ref 3.6–5.1)
Alkaline phosphatase (APISO): 81 U/L (ref 33–130)
Bilirubin, Direct: 0.2 mg/dL (ref 0.0–0.2)
Globulin: 3.2 g/dL (calc) (ref 1.9–3.7)
Indirect Bilirubin: 0.8 mg/dL (calc) (ref 0.2–1.2)
Total Bilirubin: 1 mg/dL (ref 0.2–1.2)
Total Protein: 7.4 g/dL (ref 6.1–8.1)

## 2017-05-02 LAB — CBC WITH DIFFERENTIAL/PLATELET
Basophils Absolute: 134 cells/uL (ref 0–200)
Basophils Relative: 1.7 %
Eosinophils Absolute: 561 cells/uL — ABNORMAL HIGH (ref 15–500)
Eosinophils Relative: 7.1 %
HCT: 47 % — ABNORMAL HIGH (ref 35.0–45.0)
Hemoglobin: 15.4 g/dL (ref 11.7–15.5)
Lymphs Abs: 2496 cells/uL (ref 850–3900)
MCH: 27.2 pg (ref 27.0–33.0)
MCHC: 32.8 g/dL (ref 32.0–36.0)
MCV: 83 fL (ref 80.0–100.0)
MPV: 10.1 fL (ref 7.5–12.5)
Monocytes Relative: 7.5 %
Neutro Abs: 4116 cells/uL (ref 1500–7800)
Neutrophils Relative %: 52.1 %
Platelets: 520 10*3/uL — ABNORMAL HIGH (ref 140–400)
RBC: 5.66 10*6/uL — ABNORMAL HIGH (ref 3.80–5.10)
RDW: 14.3 % (ref 11.0–15.0)
Total Lymphocyte: 31.6 %
WBC mixed population: 593 cells/uL (ref 200–950)
WBC: 7.9 10*3/uL (ref 3.8–10.8)

## 2017-05-02 LAB — LIPID PANEL
Cholesterol: 172 mg/dL (ref <200)
HDL: 60 mg/dL (ref >50)
LDL Cholesterol (Calc): 90 mg/dL (calc)
Non-HDL Cholesterol (Calc): 112 mg/dL (calc) (ref <130)
Total CHOL/HDL Ratio: 2.9 (calc) (ref <5.0)
Triglycerides: 121 mg/dL (ref <150)

## 2017-05-02 LAB — MAGNESIUM: Magnesium: 1.9 mg/dL (ref 1.5–2.5)

## 2017-05-02 NOTE — Patient Instructions (Addendum)
Please pick one of the over the counter allergy medications below and take it once daily for allergies.  Claritin or loratadine cheapest but likely the weakest  Zyrtec or certizine at night because it can make you sleepy The strongest is allegra or fexafinadine  Cheapest at [facility], [employer], [facility]  Call for colonoscopy  Schedule Mammogram

## 2017-05-03 ENCOUNTER — Other Ambulatory Visit: Payer: Self-pay | Admitting: Physician Assistant

## 2017-05-03 DIAGNOSIS — I10 Essential (primary) hypertension: Secondary | ICD-10-CM

## 2017-05-03 NOTE — Progress Notes (Signed)
Pt aware of lab results & voiced understanding of those results.

## 2017-05-03 NOTE — Progress Notes (Signed)
LVM for pt to return office call for LAB results.

## 2017-05-16 DIAGNOSIS — H2513 Age-related nuclear cataract, bilateral: Secondary | ICD-10-CM | POA: Diagnosis not present

## 2017-05-16 DIAGNOSIS — Z1231 Encounter for screening mammogram for malignant neoplasm of breast: Secondary | ICD-10-CM | POA: Diagnosis not present

## 2017-05-16 DIAGNOSIS — H524 Presbyopia: Secondary | ICD-10-CM | POA: Diagnosis not present

## 2017-05-16 DIAGNOSIS — M810 Age-related osteoporosis without current pathological fracture: Secondary | ICD-10-CM | POA: Diagnosis not present

## 2017-05-16 DIAGNOSIS — M85852 Other specified disorders of bone density and structure, left thigh: Secondary | ICD-10-CM | POA: Diagnosis not present

## 2017-05-16 DIAGNOSIS — H25013 Cortical age-related cataract, bilateral: Secondary | ICD-10-CM | POA: Diagnosis not present

## 2017-05-16 DIAGNOSIS — H04123 Dry eye syndrome of bilateral lacrimal glands: Secondary | ICD-10-CM | POA: Diagnosis not present

## 2017-05-16 DIAGNOSIS — Z803 Family history of malignant neoplasm of breast: Secondary | ICD-10-CM | POA: Diagnosis not present

## 2017-05-16 LAB — HM MAMMOGRAPHY

## 2017-05-17 LAB — HM DEXA SCAN

## 2017-05-21 ENCOUNTER — Encounter: Payer: Self-pay | Admitting: *Deleted

## 2017-06-06 ENCOUNTER — Other Ambulatory Visit: Payer: Medicare Other

## 2017-06-06 DIAGNOSIS — I10 Essential (primary) hypertension: Secondary | ICD-10-CM

## 2017-06-06 LAB — BASIC METABOLIC PANEL WITH GFR
BUN: 19 mg/dL (ref 7–25)
CO2: 27 mmol/L (ref 20–32)
Calcium: 9.7 mg/dL (ref 8.6–10.4)
Chloride: 104 mmol/L (ref 98–110)
Creat: 0.92 mg/dL (ref 0.60–0.93)
GFR, Est African American: 70 mL/min/{1.73_m2} (ref >=60)
GFR, Est Non African American: 60 mL/min/{1.73_m2} (ref >=60)
Glucose, Bld: 108 mg/dL — ABNORMAL HIGH (ref 65–99)
Potassium: 4.2 mmol/L (ref 3.5–5.3)
Sodium: 139 mmol/L (ref 135–146)

## 2017-07-05 DIAGNOSIS — M1711 Unilateral primary osteoarthritis, right knee: Secondary | ICD-10-CM | POA: Diagnosis not present

## 2017-08-03 ENCOUNTER — Emergency Department (HOSPITAL_COMMUNITY)
Admission: EM | Admit: 2017-08-03 | Discharge: 2017-08-04 | Disposition: A | Discharge status: Home / Self Care | Payer: Medicare Other | Attending: Emergency Medicine | Admitting: Emergency Medicine

## 2017-08-03 ENCOUNTER — Emergency Department (HOSPITAL_COMMUNITY): Payer: Medicare Other

## 2017-08-03 ENCOUNTER — Other Ambulatory Visit: Payer: Self-pay

## 2017-08-03 ENCOUNTER — Encounter (HOSPITAL_COMMUNITY): Payer: Self-pay | Admitting: Emergency Medicine

## 2017-08-03 DIAGNOSIS — R110 Nausea: Secondary | ICD-10-CM | POA: Insufficient documentation

## 2017-08-03 DIAGNOSIS — R1013 Epigastric pain: Secondary | ICD-10-CM

## 2017-08-03 DIAGNOSIS — R079 Chest pain, unspecified: Secondary | ICD-10-CM | POA: Diagnosis not present

## 2017-08-03 DIAGNOSIS — Z79899 Other long term (current) drug therapy: Secondary | ICD-10-CM | POA: Diagnosis not present

## 2017-08-03 DIAGNOSIS — I10 Essential (primary) hypertension: Secondary | ICD-10-CM | POA: Insufficient documentation

## 2017-08-03 DIAGNOSIS — K2970 Gastritis, unspecified, without bleeding: Secondary | ICD-10-CM | POA: Diagnosis not present

## 2017-08-03 DIAGNOSIS — Z7901 Long term (current) use of anticoagulants: Secondary | ICD-10-CM | POA: Diagnosis not present

## 2017-08-03 LAB — BASIC METABOLIC PANEL
Anion gap: 13 (ref 5–15)
BUN: 18 mg/dL (ref 6–20)
CO2: 23 mmol/L (ref 22–32)
Calcium: 8.8 mg/dL — ABNORMAL LOW (ref 8.9–10.3)
Chloride: 101 mmol/L (ref 101–111)
Creatinine, Ser: 1 mg/dL (ref 0.44–1.00)
GFR calc Af Amer: >60 mL/min (ref >60)
GFR calc non Af Amer: 53 mL/min — ABNORMAL LOW (ref >60)
Glucose, Bld: 106 mg/dL — ABNORMAL HIGH (ref 65–99)
Potassium: 3 mmol/L — ABNORMAL LOW (ref 3.5–5.1)
Sodium: 137 mmol/L (ref 135–145)

## 2017-08-03 LAB — HEPATIC FUNCTION PANEL
ALT: 22 U/L (ref 14–54)
AST: 30 U/L (ref 15–41)
Albumin: 3.7 g/dL (ref 3.5–5.0)
Alkaline Phosphatase: 76 U/L (ref 38–126)
Bilirubin, Direct: 0.2 mg/dL (ref 0.1–0.5)
Indirect Bilirubin: 0.4 mg/dL (ref 0.3–0.9)
Total Bilirubin: 0.6 mg/dL (ref 0.3–1.2)
Total Protein: 6.7 g/dL (ref 6.5–8.1)

## 2017-08-03 LAB — CBC
HCT: 46.3 % — ABNORMAL HIGH (ref 36.0–46.0)
Hemoglobin: 14.7 g/dL (ref 12.0–15.0)
MCH: 27.8 pg (ref 26.0–34.0)
MCHC: 31.7 g/dL (ref 30.0–36.0)
MCV: 87.5 fL (ref 78.0–100.0)
Platelets: 539 10*3/uL — ABNORMAL HIGH (ref 150–400)
RBC: 5.29 MIL/uL — ABNORMAL HIGH (ref 3.87–5.11)
RDW: 14.9 % (ref 11.5–15.5)
WBC: 11.4 10*3/uL — ABNORMAL HIGH (ref 4.0–10.5)

## 2017-08-03 LAB — TROPONIN I: Troponin I: <0.03 ng/mL (ref <0.03)

## 2017-08-03 LAB — LIPASE, BLOOD: Lipase: 44 U/L (ref 11–51)

## 2017-08-03 MED ORDER — ONDANSETRON HCL 4 MG/2ML IJ SOLN
4.0000 mg | Freq: Once | INTRAMUSCULAR | Status: AC
  Administered 2017-08-03: 4 mg via INTRAVENOUS
  Filled 2017-08-03: qty 2

## 2017-08-03 MED ORDER — MORPHINE SULFATE (PF) 4 MG/ML IV SOLN
2.0000 mg | Freq: Once | INTRAVENOUS | Status: AC
Start: 2017-08-03 — End: 2017-08-03
  Administered 2017-08-03: 2 mg via INTRAVENOUS
  Filled 2017-08-03: qty 1

## 2017-08-03 MED ORDER — ASPIRIN 81 MG PO CHEW
81.0000 mg | CHEWABLE_TABLET | Freq: Once | ORAL | Status: AC
  Administered 2017-08-03: 81 mg via ORAL
  Filled 2017-08-03: qty 1

## 2017-08-03 MED ORDER — PANTOPRAZOLE SODIUM 40 MG IV SOLR
40.0000 mg | Freq: Once | INTRAVENOUS | Status: AC
  Administered 2017-08-03: 40 mg via INTRAVENOUS
  Filled 2017-08-03: qty 40

## 2017-08-03 MED ORDER — GI COCKTAIL ~~LOC~~
30.0000 mL | Freq: Once | ORAL | Status: AC
  Administered 2017-08-03: 30 mL via ORAL
  Filled 2017-08-03: qty 30

## 2017-08-03 MED ORDER — FAMOTIDINE IN NACL 20-0.9 MG/50ML-% IV SOLN
20.0000 mg | Freq: Once | INTRAVENOUS | Status: AC
  Administered 2017-08-03: 20 mg via INTRAVENOUS
  Filled 2017-08-03: qty 50

## 2017-08-03 NOTE — ED Notes (Signed)
Blood sent to main lab @2102 ,  Nurse will put order in for troponin I

## 2017-08-03 NOTE — ED Provider Notes (Signed)
[facility]ERGENCY DEPARTMENT Provider Note   CSN: [id number] Arrival date & time: 08/03/17  2000     History   Chief Complaint Chief Complaint  Patient presents with  . Chest Pain  . Abdominal Pain    HPI Kimberly Watson is a 77 y.o. female w/o HTN, HLD, obesity, DVT, PE on Eloquis presents for evaluation of epigastric abdominal pain that radiates to RUQ since lunch time today. She had coffee and shrimp cocktail for lunch. Described as "burning" and sharp pain. Constant. Not pleuritic, non exertional. Associated symptoms include increased gas/belching, nausea, bad breath. Not associated with chest pain, shortness of breath, pleuritic chest pain, vomiting, dysuria, hematuria, diarrhea, constipation, melena, hematochezia. Tried to go for a walk and stretch which did not alleviate or aggravate symptoms. Aggravating factors include palpation. Denies h/o PUD, pancreatitis, heavy ETOH or NSAID use.   HPI  Past Medical History:  Diagnosis Date  . Cataract   . DVT of lower extremity (deep venous thrombosis) ([place])    left, resolved  . Elevated hemoglobin A1c   . GERD (gastroesophageal reflux disease)   . H/O left knee surgery   . Hypertension   . Leiomyoma    adnexal Leiomyoma removal in 1/11  . Nephrolithiasis   . Osteoporosis   . PE (pulmonary embolism) 08/2015    Patient Active Problem List   Diagnosis Date Noted  . Prediabetes 12/22/2016  . Encounter for Medicare annual wellness exam 04/24/2016  . 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 DVT (deep venous thrombosis) ([place])   . Osteoporosis 03/30/2015  . Medication management 03/30/2015  . Morbid obesity ([place]) 03/30/2015  . Essential hypertension 06/18/2013  . Hyperlipidemia, mixed 06/18/2013  . Other abnormal glucose 06/18/2013  . Vitamin D deficiency 06/18/2013  . GERD (gastroesophageal reflux disease) 06/18/2013  . Nephrolithiasis 06/18/2013    Past Surgical History:    Procedure Laterality Date  . CHOLECYSTECTOMY    . IVC FILTER PLACEMENT ([facility] HX)  08/2015  . JOINT REPLACEMENT Left total knee  . UTERINE FIBROID SURGERY      OB History    No data available       Home Medications    Prior to Admission medications   Medication Sig Start Date End Date Taking? Authorizing Provider  apixaban (ELIQUIS) 2.5 MG TABS tablet Take 1 tablet (2.5 mg total) by mouth 2 (two) times daily. 08/28/16  Yes Gorsuch, Ni, MD  calcium carbonate (OS-CAL) 600 MG TABS tablet Take 600 mg by mouth daily.   Yes [provider]  Cholecalciferol (VITAMIN D PO) Take 5,000 Units by mouth 3 (three) times daily.    Yes [provider]  furosemide (LASIX) 40 MG tablet TAKE 1 TABLET (40 MG TOTAL) BY MOUTH DAILY. 04/24/17  Yes Unk Pinto, MD  MAGNESIUM PO Take 1 tablet by mouth 2 (two) times daily.    Yes [provider]  metoprolol succinate (TOPROL-XL) 100 MG 24 hr tablet TAKE 1 TABLET BY MOUTH EVERY DAY TAKE WITH OR IMMEDIATELY AFTER A MEAL Patient taking differently: TAKE 1 TABLET(100mg ) BY MOUTH EVERY DAY TAKE WITH OR IMMEDIATELY AFTER A MEAL 04/12/17  Yes Unk Pinto, MD  nystatin (MYCOSTATIN/NYSTOP) 100000 UNIT/GM POWD Apply to affected area after a shower daily or as needed 03/25/14  Yes Vicie Mutters, PA-C  famotidine (PEPCID) 20 MG tablet Take 1 tablet (20 mg total) by mouth 2 (two) times daily. 08/04/17   Kinnie Feil, PA-C  pantoprazole (PROTONIX) 20 MG  tablet Take 1 tablet (20 mg total) by mouth daily. 08/04/17   Kinnie Feil, PA-C  sucralfate (CARAFATE) 1 GM/10ML suspension Take 10 mLs (1 g total) by mouth 4 (four) times daily -  with meals and at bedtime. 08/04/17   Kinnie Feil, PA-C    Family History Family History  Problem Relation Age of Onset  . Hypertension Mother   . Diabetes Mother   . Breast cancer Mother   . Congestive Heart Failure Mother        Died age 81  . Osteoporosis Father     Social  History Social History   Tobacco Use  . Smoking status: Never Smoker  . Smokeless tobacco: Never Used  Substance Use Topics  . Alcohol use: No  . Drug use: No     Allergies   Evista [raloxifene] and Fosamax [alendronate sodium]   Review of Systems Review of Systems  Gastrointestinal: Positive for abdominal pain and nausea.       +increased belching and gas   All other systems reviewed and are negative.    Physical Exam Updated Vital Signs BP 133/82   Pulse 77   Temp 98.6 F (37 C) (Oral)   Resp 18   Ht 5\' 3"  (1.6 m)   Wt 77.1 kg (170 lb)   SpO2 90%   BMI 30.11 kg/m   Physical Exam  Constitutional: She appears well-developed and well-nourished.  NAD.  HENT:  Head: Normocephalic and atraumatic.  Nose: Nose normal.  Moist mucous membranes Tonsils and oropharynx normal  Eyes: Conjunctivae, EOM and lids are normal.  Neck: Trachea normal and normal range of motion.  Neck is supple Trachea midline No cervical adenopathy  Cardiovascular: Normal rate, regular rhythm, S1 normal, S2 normal and normal heart sounds.  Pulses:      Carotid pulses are 2+ on the right side, and 2+ on the left side.      Radial pulses are 2+ on the right side, and 2+ on the left side.       Dorsalis pedis pulses are 2+ on the right side, and 2+ on the left side.  RRR No S3 No orthopnea No LE edema No carotid bruits  Pulmonary/Chest: Effort normal and breath sounds normal. No respiratory distress. She has no decreased breath sounds. She has no wheezes. She has no rhonchi. She has no rales.  No chest wall tenderness  Abdominal: Soft. Bowel sounds are normal. There is tenderness.  RUQ and epigastric abdominal tenderness. No G/R/R. No suprapubic or CVA tenderness.   Lymphadenopathy:    She has no cervical adenopathy.  Neurological: She is alert. GCS eye subscore is 4. GCS verbal subscore is 5. GCS motor subscore is 6.  Skin: Skin is warm and dry. Capillary refill takes less than 2  seconds.  Psychiatric: She has a normal mood and affect. Her speech is normal and behavior is normal. Judgment and thought content normal. Cognition and memory are normal.     ED Treatments / Results  Labs (all labs ordered are listed, but only abnormal results are displayed) Labs Reviewed  BASIC METABOLIC PANEL - Abnormal; Notable for the following components:      Result Value   Potassium 3.0 (*)    Glucose, Bld 106 (*)    Calcium 8.8 (*)    GFR calc non Af Amer 53 (*)    All other components within normal limits  CBC - Abnormal; Notable for the following components:   WBC 11.4 (*)  RBC 5.29 (*)    HCT 46.3 (*)    Platelets 539 (*)    All other components within normal limits  LIPASE, BLOOD  TROPONIN I  HEPATIC FUNCTION PANEL  I-STAT TROPONIN, ED  I-STAT TROPONIN, ED    EKG  EKG Interpretation  Date/Time:  Friday August 03 2017 20:02:10 EST Ventricular Rate:  77 PR Interval:    QRS Duration: 114 QT Interval:  413 QTC Calculation: 468 R Axis:   -47 Text Interpretation:  Sinus rhythm Left anterior fascicular block Left ventricular hypertrophy Anterior Q waves, possibly due to LVH since last tracing no significant change Confirmed by Daleen Bo [phone]) on 08/04/2017 12:05:11 AM       Radiology Dg Chest 2 View  Result Date: 08/03/2017 CLINICAL DATA:  Right-sided chest pain. EXAM: CHEST  2 VIEW COMPARISON:  Chest radiographs and CTA 08/19/2015 FINDINGS: The patient is mildly rotated to the right. The cardiac silhouette again appears borderline enlarged. The lungs are hypoinflated with mild pulmonary vascular congestion. No alveolar edema, pleural effusion, or pneumothorax is identified. Right upper quadrant abdominal surgical clips and mild thoracolumbar levoscoliosis are noted. IMPRESSION: Low lung volumes and borderline cardiomegaly with mild pulmonary vascular congestion. Electronically Signed   By: Logan Bores M.D.   On: 08/03/2017 21:08     Procedures Procedures (including critical care time)  Medications Ordered in ED Medications  famotidine (PEPCID) IVPB 20 mg premix (0 mg Intravenous Stopped 08/03/17 2233)  pantoprazole (PROTONIX) injection 40 mg (40 mg Intravenous Given 08/03/17 2203)  gi cocktail (Maalox,Lidocaine,Donnatal) (30 mLs Oral Given 08/03/17 2203)  ondansetron (ZOFRAN) injection 4 mg (4 mg Intravenous Given 08/03/17 2203)  aspirin chewable tablet 81 mg (81 mg Oral Given 08/03/17 2203)  morphine 4 MG/ML injection 2 mg (2 mg Intravenous Given 08/03/17 2347)  ondansetron (ZOFRAN) injection 4 mg (4 mg Intravenous Given 08/03/17 2347)     Initial Impression / Assessment and Plan / ED Course  I have reviewed the triage vital signs and the nursing notes.  Pertinent labs & imaging results that were available during my care of the patient were reviewed by me and considered in my medical decision making (see chart for details).  Clinical Course as of Aug 04 106  Fri Aug 03, 2017  2248 WBC: (!) 11.4 [CG]  2248 Potassium: (!) 3.0 [CG]    Clinical Course User Index [CG] Kinnie Feil, PA-C   77 year old female presents for epigastric abdominal pain that radiates to right upper quadrant since lunchtime today.Pain is constant, burning in nature.Nonpleuritic and nonexertional. Associated with increased gas, belching, nausea and bad breath.  On my exam, patient has very mild epigastric and right upper quadrant tenderness. No peritoneal signs.Distal pulses intact bilaterally.  Given history of present illness, high suspicion for acid reflux/gastritis. Symptoms do not sound cardiac in nature. She does have history of pulmonary embolism but has no frank chest pain, shortness of breath, tachypnea, hypoxia. Given location of discomfort and age, we'll initiate chest pain and belly workup. Pepcid, Protonix, GI cocktail, Zofran, aspirin ordered.  Final Clinical Impressions(s) / ED Diagnoses   ED work up today reassuring.  Nausea  resolved, abdominal discomfort improved. No urinary symptoms and no suprapubic or CVA tenderness, U/A not indicated today. EKG, delta trop, CXR unremarkable. High suspicion for gastritis/esophagitis, GERD, PUD. ACS, AAA, dissection, PE considered but do not fit clinical picture or history. Will d/c with GERD/PUD tx and PCP f/u. Discussed return precautions. Pt shared with SP.  Final diagnoses:  Epigastric abdominal pain    ED Discharge Orders        Ordered    pantoprazole (PROTONIX) 20 MG tablet  Daily     08/04/17 0018    famotidine (PEPCID) 20 MG tablet  2 times daily     08/04/17 0018    sucralfate (CARAFATE) 1 GM/10ML suspension  3 times daily with meals & bedtime     08/04/17 0018       Kinnie Feil, PA-C 08/04/17 1173    Daleen Bo, MD 08/04/17 [phone]

## 2017-08-03 NOTE — ED Notes (Signed)
Patient transported to X-ray 

## 2017-08-03 NOTE — ED Triage Notes (Signed)
Patient arrived [facility], reports pain of 9 on a 0-10 scale in "right ribcage", states it feels like gas, states the pain radiates to the back, reports some nausea and No vomiting. States that she felt a little better with "burping"

## 2017-08-03 NOTE — ED Notes (Signed)
PT returned to room

## 2017-08-04 LAB — I-STAT TROPONIN, ED: Troponin i, poc: 0 ng/mL (ref 0.00–0.08)

## 2017-08-04 MED ORDER — SUCRALFATE 1 GM/10ML PO SUSP: 1.0000 g | Freq: Three times a day (TID) | ORAL | 0 refills | Status: DC

## 2017-08-04 MED ORDER — PANTOPRAZOLE SODIUM 20 MG PO TBEC: 20.0000 mg | DELAYED_RELEASE_TABLET | Freq: Every day | ORAL | 0 refills | Status: DC

## 2017-08-04 MED ORDER — FAMOTIDINE 20 MG PO TABS: 20.0000 mg | ORAL_TABLET | Freq: Two times a day (BID) | ORAL | 0 refills | Status: DC

## 2017-08-04 NOTE — Discharge Instructions (Signed)
Your lab work, EKG and heart enzymes were all reassuring today.   I have high suspicion that your symptoms are from a GI source like acid reflux, ulcer, gastritis or esophagitis. These are usually all treated similarly at first.   Take protonix and pepcid as prescribed. These medicines take 2-3 weeks to start working, so continue taking them as prescribed. Use carafate suspension before meals.   See attached diet to help with acid reflux.   Follow up with your primary care doctor in 2-3 days if pain persist.  Return to emergency department for vomiting, fevers, chills, bloody stools, chest pain, shortness of breath

## 2017-08-04 NOTE — ED Provider Notes (Signed)
  Face-to-face evaluation   History: She is here for right upper abdomen, and epigastric discomfort.  Symptoms started before lunch and got worse after eating a large meal.  Physical exam: Elderly, alert, cooperative.  No respiratory distress.  No dysarthria or aphasia.   Medical screening examination/treatment/procedure(s) were conducted as a shared visit with non-physician practitioner(s) and myself.  I personally evaluated the patient during the encounter   Daleen Bo, MD 08/04/17 1622

## 2017-08-08 NOTE — Progress Notes (Signed)
Assessment and Plan:  Kimberly Watson was seen today for follow-up.  Diagnoses and all orders for this visit:  Colon cancer screening/hiatal hernia/diverticulosis -     Ambulatory referral to Gastroenterology -     Overdue for EGD/colonoscopy follow up - previously established with [facility]  GERD with esophagitis Symptoms well controlled Discussed lifestyle modification, diet at length Dietary recommendations provided as handout Continue with medications as prescribed by ER  Further disposition pending results of labs. Discussed med's effects and SE's.   Over 15 minutes of exam, counseling, chart review, and critical decision making was performed.   Future Appointments  Date Time Provider [place]  08/27/2017  1:30 PM [name], MD [facility] None  11/14/2017 10:30 AM Unk Pinto, MD [facility] None  05/09/2018 10:00 AM Vicie Mutters, PA-C [facility] None    ------------------------------------------------------------------------------------------------------------------   HPI BP 122/84   Pulse 75   Temp (!) 97.3 F (36.3 C)   Ht 5' 2.5" (1.588 m)   Wt 168 lb 12.8 oz (76.6 kg)   SpO2 95%   BMI 30.38 kg/m   77 y.o.female presents for follow up after ER visit on 1/4 for epigastric/RUQ pain; she was worked up for chest pain and abdominal pain, tx with pepcid, protonix, GI cocktail, zofran and aspirin. She was discharged after symptoms improved with a treatment plan for gastritis/PUD. She reports she is doing much better since discharge and has not had any discomfort. She believes the episode was caused by a particularly indulgent holiday meal.    She was recommended on discharge to follow up with GI for EGD due to suspicion of ulcerated hiatal hernia. She reports hx of hiatal hernia that had been causing discomfort for several weeks prior. 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   She also discusses concerns about R knee osteoarthritis for which she is established with Dr. Wynelle Link. She had a cortisone injection at the last visit and has significant relief, but is concerned about her knee being "bone on bone" and concerns about being under anesthesia for a TKA. Discussed spinal anesthesia with her briefly today and recommended she follow up with Dr. Wynelle Link at her 3 month follow up and discuss these concerns further.    Past Medical History:  Diagnosis Date  . Cataract   . DVT of lower extremity (deep venous thrombosis) ([place])    left, resolved  . Elevated hemoglobin A1c   . GERD (gastroesophageal reflux disease)   . H/O left knee surgery   . Hypertension   . Leiomyoma    adnexal Leiomyoma removal in 1/11  . Nephrolithiasis   . Osteoporosis   . PE (pulmonary embolism) 08/2015     Allergies  Allergen Reactions  . Evista [Raloxifene] Other (See Comments)    Blood clots - DVT and Pulmonary Embolism   . Fosamax [Alendronate Sodium]     esophagitis    Current Outpatient Medications on File Prior to Visit  Medication Sig  . apixaban (ELIQUIS) 2.5 MG TABS tablet Take 1 tablet (2.5 mg total) by mouth 2 (two) times daily.  . calcium carbonate (OS-CAL) 600 MG TABS tablet Take 600 mg by mouth daily.  . Cholecalciferol (VITAMIN D PO) Take 5,000 Units by mouth 3 (three) times daily.   . famotidine (PEPCID) 20 MG tablet Take 1 tablet (20 mg total) by  mouth 2 (two) times daily.  . furosemide (LASIX) 40 MG tablet TAKE 1 TABLET (40 MG TOTAL) BY MOUTH DAILY.  Marland Kitchen MAGNESIUM PO Take 1 tablet by mouth 2 (two) times daily.   . metoprolol succinate (TOPROL-XL) 100 MG 24 hr tablet TAKE 1 TABLET BY MOUTH EVERY DAY TAKE WITH OR IMMEDIATELY AFTER A MEAL (Patient taking differently: TAKE 1 TABLET(100mg ) BY MOUTH EVERY DAY TAKE WITH OR IMMEDIATELY AFTER A MEAL)  . nystatin (MYCOSTATIN/NYSTOP)  100000 UNIT/GM POWD Apply to affected area after a shower daily or as needed  . pantoprazole (PROTONIX) 20 MG tablet Take 1 tablet (20 mg total) by mouth daily.  . sucralfate (CARAFATE) 1 GM/10ML suspension Take 10 mLs (1 g total) by mouth 4 (four) times daily -  with meals and at bedtime.   No current facility-administered medications on file prior to visit.     ROS: all negative except above.   Physical Exam:  BP 122/84   Pulse 75   Temp (!) 97.3 F (36.3 C)   Ht 5' 2.5" (1.588 m)   Wt 168 lb 12.8 oz (76.6 kg)   SpO2 95%   BMI 30.38 kg/m   General Appearance: Well nourished, in no apparent distress. ENT/Mouth: Ext aud canals clear, TMs without erythema, bulging. No erythema, swelling, or exudate on post pharynx.  Tonsils not swollen or erythematous. Hearing normal.  Neck: Supple.  Respiratory: Respiratory effort normal, BS equal bilaterally without rales, rhonchi, wheezing or stridor.  Cardio: RRR with no MRGs. Brisk peripheral pulses without edema.  Abdomen: Soft, + BS.  Non tender, no guarding, rebound, hernias, masses. Lymphatics: Non tender without lymphadenopathy.  Musculoskeletal: Full ROM, 5/5 strength, somewhat antalgic gait, stops intermittently to rest. R knee with crepitus and mild effusion, not notably warm to touch or injected.   Skin: Warm, dry without rashes, lesions, ecchymosis.  Neuro: Cranial nerves intact. Normal muscle tone, no cerebellar symptoms. Sensation intact.  Psych: Awake and oriented X 3, normal affect, Insight and Judgment appropriate.     Izora Ribas, NP 11:40 AM [facility]

## 2017-08-09 ENCOUNTER — Ambulatory Visit: Payer: Medicare Other | Admitting: Adult Health

## 2017-08-09 ENCOUNTER — Encounter: Payer: Self-pay | Admitting: Adult Health

## 2017-08-09 VITALS — BP 122/84 | HR 75 | Temp 97.3°F | Ht 62.5 in | Wt 168.8 lb

## 2017-08-09 DIAGNOSIS — K5790 Diverticulosis of intestine, part unspecified, without perforation or abscess without bleeding: Secondary | ICD-10-CM | POA: Diagnosis not present

## 2017-08-09 DIAGNOSIS — Z1211 Encounter for screening for malignant neoplasm of colon: Secondary | ICD-10-CM | POA: Diagnosis not present

## 2017-08-09 DIAGNOSIS — M1711 Unilateral primary osteoarthritis, right knee: Secondary | ICD-10-CM | POA: Diagnosis not present

## 2017-08-09 DIAGNOSIS — K449 Diaphragmatic hernia without obstruction or gangrene: Secondary | ICD-10-CM | POA: Insufficient documentation

## 2017-08-09 NOTE — Patient Instructions (Signed)
Food Choices for Gastroesophageal Reflux Disease, Adult When you have gastroesophageal reflux disease (GERD), the foods you eat and your eating habits are very important. Choosing the right foods can help ease the discomfort of GERD. Consider working with a diet and nutrition specialist (dietitian) to help you make healthy food choices. What general guidelines should I follow? Eating plan  Choose healthy foods low in fat, such as fruits, vegetables, whole grains, low-fat dairy products, and lean meat, fish, and poultry.  Eat frequent, small meals instead of three large meals each day. Eat your meals slowly, in a relaxed setting. Avoid bending over or lying down until 2-3 hours after eating.  Limit high-fat foods such as fatty meats or fried foods.  Limit your intake of oils, butter, and shortening to less than 8 teaspoons each day.  Avoid the following: ? Foods that cause symptoms. These may be different for different people. Keep a food diary to keep track of foods that cause symptoms. ? Alcohol. ? Drinking large amounts of liquid with meals. ? Eating meals during the 2-3 hours before bed.  Cook foods using methods other than frying. This may include baking, grilling, or broiling. Lifestyle   Maintain a healthy weight. Ask your health care provider what weight is healthy for you. If you need to lose weight, work with your health care provider to do so safely.  Exercise for at least 30 minutes on 5 or more days each week, or as told by your health care provider.  Avoid wearing clothes that fit tightly around your waist and chest.  Do not use any products that contain nicotine or tobacco, such as cigarettes and e-cigarettes. If you need help quitting, ask your health care provider.  Sleep with the head of your bed raised. Use a wedge under the mattress or blocks under the bed frame to raise the head of the bed. What foods are not recommended? The items listed may not be a complete  list. Talk with your dietitian about what dietary choices are best for you. Grains Pastries or quick breads with added fat. French toast. Vegetables Deep fried vegetables. French fries. Any vegetables prepared with added fat. Any vegetables that cause symptoms. For some people this may include tomatoes and tomato products, chili peppers, onions and garlic, and horseradish. Fruits Any fruits prepared with added fat. Any fruits that cause symptoms. For some people this may include citrus fruits, such as oranges, grapefruit, pineapple, and lemons. Meats and other protein foods High-fat meats, such as fatty beef or pork, hot dogs, ribs, ham, sausage, salami and bacon. Fried meat or protein, including fried fish and fried chicken. Nuts and nut butters. Dairy Whole milk and chocolate milk. Sour cream. Cream. Ice cream. Cream cheese. Milk shakes. Beverages Coffee and tea, with or without caffeine. Carbonated beverages. Sodas. Energy drinks. Fruit juice made with acidic fruits (such as orange or grapefruit). Tomato juice. Alcoholic drinks. Fats and oils Butter. Margarine. Shortening. Ghee. Sweets and desserts Chocolate and cocoa. Donuts. Seasoning and other foods Pepper. Peppermint and spearmint. Any condiments, herbs, or seasonings that cause symptoms. For some people, this may include curry, hot sauce, or vinegar-based salad dressings. Summary  When you have gastroesophageal reflux disease (GERD), food and lifestyle choices are very important to help ease the discomfort of GERD.  Eat frequent, small meals instead of three large meals each day. Eat your meals slowly, in a relaxed setting. Avoid bending over or lying down until 2-3 hours after eating.  Limit high-fat   foods such as fatty meat or fried foods. This information is not intended to replace advice given to you by your health care provider. Make sure you discuss any questions you have with your health care provider. Document Released:  07/17/2005 Document Revised: 07/18/2016 Document Reviewed: 07/18/2016 Elsevier Interactive Patient Education  2018 [employer]  

## 2017-08-10 ENCOUNTER — Encounter: Payer: Self-pay | Admitting: Internal Medicine

## 2017-08-27 ENCOUNTER — Inpatient Hospital Stay: Payer: Medicare Other

## 2017-08-27 ENCOUNTER — Encounter: Payer: Self-pay | Admitting: Hematology and Oncology

## 2017-08-27 ENCOUNTER — Inpatient Hospital Stay: Payer: Medicare Other | Attending: Hematology and Oncology | Admitting: Hematology and Oncology

## 2017-08-27 ENCOUNTER — Telehealth: Payer: Self-pay | Admitting: Hematology and Oncology

## 2017-08-27 ENCOUNTER — Other Ambulatory Visit: Payer: Self-pay | Admitting: *Deleted

## 2017-08-27 DIAGNOSIS — R918 Other nonspecific abnormal finding of lung field: Secondary | ICD-10-CM | POA: Insufficient documentation

## 2017-08-27 DIAGNOSIS — D471 Chronic myeloproliferative disease: Secondary | ICD-10-CM | POA: Insufficient documentation

## 2017-08-27 DIAGNOSIS — Z803 Family history of malignant neoplasm of breast: Secondary | ICD-10-CM | POA: Diagnosis not present

## 2017-08-27 DIAGNOSIS — R0989 Other specified symptoms and signs involving the circulatory and respiratory systems: Secondary | ICD-10-CM | POA: Diagnosis not present

## 2017-08-27 DIAGNOSIS — Z7901 Long term (current) use of anticoagulants: Secondary | ICD-10-CM | POA: Insufficient documentation

## 2017-08-27 DIAGNOSIS — R1013 Epigastric pain: Secondary | ICD-10-CM | POA: Diagnosis not present

## 2017-08-27 DIAGNOSIS — Z86718 Personal history of other venous thrombosis and embolism: Secondary | ICD-10-CM | POA: Insufficient documentation

## 2017-08-27 DIAGNOSIS — Z79899 Other long term (current) drug therapy: Secondary | ICD-10-CM

## 2017-08-27 DIAGNOSIS — M810 Age-related osteoporosis without current pathological fracture: Secondary | ICD-10-CM

## 2017-08-27 DIAGNOSIS — I82402 Acute embolism and thrombosis of unspecified deep veins of left lower extremity: Secondary | ICD-10-CM

## 2017-08-27 DIAGNOSIS — K449 Diaphragmatic hernia without obstruction or gangrene: Secondary | ICD-10-CM

## 2017-08-27 LAB — CBC WITH DIFFERENTIAL/PLATELET
Basophils Absolute: 0.1 10*3/uL (ref 0.0–0.1)
Basophils Relative: 1 %
Eosinophils Absolute: 0.5 10*3/uL (ref 0.0–0.5)
Eosinophils Relative: 5 %
HCT: 48.5 % — ABNORMAL HIGH (ref 34.8–46.6)
Hemoglobin: 15.7 g/dL (ref 11.6–15.9)
Lymphocytes Relative: 25 %
Lymphs Abs: 2.3 10*3/uL (ref 0.9–3.3)
MCH: 27.4 pg (ref 25.1–34.0)
MCHC: 32.4 g/dL (ref 31.5–36.0)
MCV: 84.5 fL (ref 79.5–101.0)
Monocytes Absolute: 0.7 10*3/uL (ref 0.1–0.9)
Monocytes Relative: 8 %
Neutro Abs: 5.6 10*3/uL (ref 1.5–6.5)
Neutrophils Relative %: 61 %
Platelets: 544 10*3/uL — ABNORMAL HIGH (ref 145–400)
RBC: 5.74 MIL/uL — ABNORMAL HIGH (ref 3.70–5.45)
RDW: 15.6 % (ref 11.2–16.1)
WBC: 9.1 10*3/uL (ref 3.9–10.3)

## 2017-08-27 LAB — URIC ACID: Uric Acid, Serum: 6.9 mg/dL (ref 2.6–7.4)

## 2017-08-27 LAB — LACTATE DEHYDROGENASE: LDH: 215 U/L (ref 125–245)

## 2017-08-27 LAB — SEDIMENTATION RATE: Sed Rate: 4 mm/hr (ref 0–22)

## 2017-08-27 MED ORDER — PANTOPRAZOLE SODIUM 20 MG PO TBEC: 20.0000 mg | DELAYED_RELEASE_TABLET | Freq: Every day | ORAL | 0 refills | Status: DC

## 2017-08-27 MED ORDER — FAMOTIDINE 20 MG PO TABS: 20.0000 mg | ORAL_TABLET | Freq: Two times a day (BID) | ORAL | 0 refills | Status: DC

## 2017-08-27 NOTE — Assessment & Plan Note (Signed)
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

## 2017-08-27 NOTE — Telephone Encounter (Signed)
Patient is scheduled per 1/28 los.

## 2017-08-27 NOTE — Assessment & Plan Note (Signed)
She had history of recurrent thrombosis She is doing well on chronic anticoagulation therapy I recommend she continue the same I plan to order additional workup as above to rule out myeloproliferative disorder If test results came back positive, I will bring her back to discuss further plan of care

## 2017-08-27 NOTE — Progress Notes (Signed)
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 I reviewed her records extensively and collaborated the history with the patient. The patient has been taking Evista for over 25 years due to strong family history of breast cancer and for osteoporosis management. In January 2017, she had viral gastroenteritis with severe diarrhea and dehydration. The patient has been taking Lasix chronically for mild bilateral lower extremity edema, left greater than the right. 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 She recalled having profuse diarrhea nonstop for 24 hours leading up to the blood clot event. Of note, prior to the diagnosis, she was not taking aspirin consistently  Prior to that she denies recent history of trauma, long distance travel, recent surgery, smoking or prolonged immobilization. She had no prior history or diagnosis of cancer. Her age appropriate screening programs are up-to-date. She had prior surgeries before and never had perioperative thromboembolic events. The patient had been exposed to birth control pills and never had thrombotic events. The patient had been pregnant before and denies history of peripartum thromboembolic event or history of recurrent miscarriages. There is no family history of blood clots or miscarriages.  On 08/18/2015, she presented to the emergency department due to symptoms of severe weakness, chest pressure, shortness of  breath and near syncopal episode. She had extensive evaluation in the emergency room. D-dimer was elevated at greater than 20.  CT angiogram dated 08/19/2015 show massive saddle pulmonary embolus with evidence of severe right heart strain.  Left lower extremity venous Doppler on 08/19/2015 showed findings consistent with acute deep vein thrombosis involving the left femoral vein, left popliteal vein, left posterior tibial vein and left peroneal vein.  Echocardiogram on 08/19/2015 show preserved ejection fraction but with right ventricular dilatation, moderate tricuspid valvular regurgitation, oscillating density on TV(thought to be blood clot in transit) and moderate to severe increased right systolic pressure/evidence of severe pulmonary hypertension  The patient was admitted with IV heparin anticoagulation therapy and placement of IVC filter on 08/20/2015.  Echocardiogram was repeated on 08/23/2015 which showed persistent elevated pulmonary hypertension. The mass over the right tricuspid valve is no longer visible She was hospitalized on 08/18/2015 to 08/24/2015. She was discharged home on Eliquis.  Evista was discontinued. On 12/07/2015, IVC filter has been removed The patient was recommended to continue on apixaban indefinitely  INTERVAL HISTORY: Please see below for problem oriented charting. She returns for further follow-up She was recently seen in department for epigastric pain She was told she have ulcerated hiatal hernia She is doing well on anticoagulation therapy The patient denies any recent signs or symptoms of bleeding such as spontaneous epistaxis, hematuria or hematochezia. Her appetite is stable She denies recent weight loss No recent infection The patient is planning to go out of town and will be returning on September 11, 2017  REVIEW OF SYSTEMS:   Constitutional: Denies fevers, chills or abnormal weight loss Eyes: Denies blurriness of vision Ears, nose, mouth,  throat, and face: Denies mucositis or sore throat Respiratory: Denies cough, dyspnea or wheezes Cardiovascular: Denies palpitation,  chest discomfort or lower extremity swelling Gastrointestinal:  Denies nausea, heartburn or change in bowel habits Skin: Denies abnormal skin rashes Lymphatics: Denies new lymphadenopathy or easy bruising Neurological:Denies numbness, tingling or new weaknesses Behavioral/Psych: Mood is stable, no new changes  All other systems were reviewed with the patient and are negative.  I have reviewed the past medical history, past surgical history, social history and family history with the patient and they are unchanged from previous note.  ALLERGIES:  is allergic to evista [raloxifene] and fosamax [alendronate sodium].  MEDICATIONS:  Current Outpatient Medications  Medication Sig Dispense Refill  . apixaban (ELIQUIS) 2.5 MG TABS tablet Take 1 tablet (2.5 mg total) by mouth 2 (two) times daily. 90 tablet 11  . calcium carbonate (OS-CAL) 600 MG TABS tablet Take 600 mg by mouth daily.    . Cholecalciferol (VITAMIN D PO) Take 5,000 Units by mouth 3 (three) times daily.     . famotidine (PEPCID) 20 MG tablet Take 1 tablet (20 mg total) by mouth 2 (two) times daily. 90 tablet 0  . furosemide (LASIX) 40 MG tablet TAKE 1 TABLET (40 MG TOTAL) BY MOUTH DAILY. 90 tablet 1  . MAGNESIUM PO Take 1 tablet by mouth 2 (two) times daily.     . metoprolol succinate (TOPROL-XL) 100 MG 24 hr tablet TAKE 1 TABLET BY MOUTH EVERY DAY TAKE WITH OR IMMEDIATELY AFTER A MEAL (Patient taking differently: TAKE 1 TABLET(100mg ) BY MOUTH EVERY DAY TAKE WITH OR IMMEDIATELY AFTER A MEAL) 90 tablet 1  . nystatin (MYCOSTATIN/NYSTOP) 100000 UNIT/GM POWD Apply to affected area after a shower daily or as needed 60 g 2  . pantoprazole (PROTONIX) 20 MG tablet Take 1 tablet (20 mg total) by mouth daily. 90 tablet 0  . sucralfate (CARAFATE) 1 GM/10ML suspension Take 10 mLs (1 g total) by mouth 4 (four) times  daily -  with meals and at bedtime. 420 mL 0   No current facility-administered medications for this visit.     PHYSICAL EXAMINATION: ECOG PERFORMANCE STATUS: 1 - Symptomatic but completely ambulatory  Vitals:   08/27/17 1330  BP: (!) 141/91  Pulse: 72  Resp: 18  Temp: 97.9 F (36.6 C)  SpO2: 96%   Filed Weights   08/27/17 1330  Weight: 167 lb 14.4 oz (76.2 kg)    GENERAL:alert, no distress and comfortable SKIN: skin color, texture, turgor are normal, no rashes or significant lesions EYES: normal, Conjunctiva are pink and non-injected, sclera clear Musculoskeletal:no cyanosis of digits and no clubbing  NEURO: alert & oriented x 3 with fluent speech, no focal motor/sensory deficits  LABORATORY DATA:  I have reviewed the data as listed    Component Value Date/Time   NA 137 08/03/2017 2006   K 3.0 (L) 08/03/2017 2006   CL 101 08/03/2017 2006   CO2 23 08/03/2017 2006   GLUCOSE 106 (H) 08/03/2017 2006   BUN 18 08/03/2017 2006   CREATININE 1.00 08/03/2017 2006   CREATININE 0.92 06/06/2017 1059   CALCIUM 8.8 (L) 08/03/2017 2006   PROT 6.7 08/03/2017 2006   ALBUMIN 3.7 08/03/2017 2006   AST 30 08/03/2017 2006   ALT 22 08/03/2017 2006   ALKPHOS 76 08/03/2017 2006   BILITOT 0.6 08/03/2017 2006   GFRNONAA 53 (L) 08/03/2017 2006   GFRNONAA 60 06/06/2017 1059   GFRAA >60 08/03/2017 2006   GFRAA 70 06/06/2017 1059    No results found for: SPEP, UPEP  Lab Results  Component Value Date   WBC  9.1 08/27/2017   NEUTROABS 5.6 08/27/2017   HGB 15.7 08/27/2017   HCT 48.5 (H) 08/27/2017   MCV 84.5 08/27/2017   PLT 544 (H) 08/27/2017      Chemistry      Component Value Date/Time   NA 137 08/03/2017 2006   K 3.0 (L) 08/03/2017 2006   CL 101 08/03/2017 2006   CO2 23 08/03/2017 2006   BUN 18 08/03/2017 2006   CREATININE 1.00 08/03/2017 2006   CREATININE 0.92 06/06/2017 1059      Component Value Date/Time   CALCIUM 8.8 (L) 08/03/2017 2006   ALKPHOS 76 08/03/2017 2006    AST 30 08/03/2017 2006   ALT 22 08/03/2017 2006   BILITOT 0.6 08/03/2017 2006       RADIOGRAPHIC STUDIES: I have personally reviewed the radiological images as listed and agreed with the findings in the report. Dg Chest 2 View  Result Date: 08/03/2017 CLINICAL DATA:  Right-sided chest pain. EXAM: CHEST  2 VIEW COMPARISON:  Chest radiographs and CTA 08/19/2015 FINDINGS: The patient is mildly rotated to the right. The cardiac silhouette again appears borderline enlarged. The lungs are hypoinflated with mild pulmonary vascular congestion. No alveolar edema, pleural effusion, or pneumothorax is identified. Right upper quadrant abdominal surgical clips and mild thoracolumbar levoscoliosis are noted. IMPRESSION: Low lung volumes and borderline cardiomegaly with mild pulmonary vascular congestion. Electronically Signed   By: Logan Bores M.D.   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 CBC with Differential/Platelet    Standing Status:   Future    Number of Occurrences:   1    Standing Expiration Date:   10/01/2018  . Sedimentation rate    Standing Status:   Future    Number of  Occurrences:   1    Standing Expiration Date:   10/01/2018  . JAK2 (including V617F and Exon 12), MPL, and CALR-Next Generation Sequencing    Standing Status:   Future    Number of Occurrences:   1    Standing Expiration Date:   10/01/2018  . Lactate dehydrogenase    Standing Status:   Future    Number of Occurrences:   1    Standing Expiration Date:   10/01/2018  . Uric acid    Standing Status:   Future    Number of Occurrences:   1    Standing Expiration Date:   10/01/2018  . BCR-ABL1 FISH    Standing Status:   Future    Number of Occurrences:   1    Standing Expiration Date:   10/01/2018  . Iron and TIBC    Standing Status:   Future    Number of Occurrences:   1  Standing Expiration Date:   10/01/2018  . Ferritin    Standing Status:   Future    Number of Occurrences:   1    Standing Expiration Date:   10/01/2018   All questions were answered. The patient knows to call the clinic with any problems, questions or concerns. No barriers to learning was detected. I spent 15 minutes counseling the patient face to face. The total time spent in the appointment was 20 minutes and more than 50% was on counseling and review of test results     [name], MD 08/27/2017 5:01 PM

## 2017-08-28 LAB — IRON AND TIBC
Iron: 66 ug/dL (ref 41–142)
Saturation Ratios: 19 % — ABNORMAL LOW (ref 21–57)
TIBC: 349 ug/dL (ref 236–444)
UIBC: 283 ug/dL

## 2017-08-28 LAB — FERRITIN: Ferritin: 99 ng/mL (ref 9–269)

## 2017-09-06 ENCOUNTER — Telehealth: Payer: Self-pay

## 2017-09-06 DIAGNOSIS — R509 Fever, unspecified: Secondary | ICD-10-CM | POA: Diagnosis not present

## 2017-09-06 NOTE — Telephone Encounter (Signed)
vm left on both home and mobile numbers for pt to call back regarding need to see Dr Alvy Bimler on Monday 2/11

## 2017-09-07 ENCOUNTER — Telehealth: Payer: Self-pay | Admitting: *Deleted

## 2017-09-07 NOTE — Telephone Encounter (Signed)
-----   Message from [name], MD sent at 09/07/2017  8:29 AM EST ----- Regarding: RE: review blood test Hi Louretta Tantillo,  Can you follow-up on this? ----- Message ----- From: Paulla Dolly, RN Sent: 09/06/2017   4:09 PM To: [name], MD Subject: RE: review blood test                          Left her a voice mail msg ----- Message ----- From: [name], MD Sent: 09/06/2017   3:51 PM To: Paulla Dolly, RN Subject: review blood test                              I just had her blood tests back. Can she come in Monday at 2 pm to discuss? No need labs, 30 mins. If ok, please send urgent scheduling msg

## 2017-09-07 NOTE — Telephone Encounter (Signed)
Dr Alvy Bimler will see her on 2/14 @ 3:15. Pt notified of appt.

## 2017-09-13 ENCOUNTER — Encounter: Payer: Self-pay | Admitting: Hematology and Oncology

## 2017-09-13 ENCOUNTER — Inpatient Hospital Stay: Payer: Medicare Other | Attending: Hematology and Oncology | Admitting: Hematology and Oncology

## 2017-09-13 ENCOUNTER — Telehealth: Payer: Self-pay | Admitting: Hematology and Oncology

## 2017-09-13 DIAGNOSIS — Z299 Encounter for prophylactic measures, unspecified: Secondary | ICD-10-CM | POA: Insufficient documentation

## 2017-09-13 DIAGNOSIS — Z79899 Other long term (current) drug therapy: Secondary | ICD-10-CM | POA: Insufficient documentation

## 2017-09-13 DIAGNOSIS — D471 Chronic myeloproliferative disease: Secondary | ICD-10-CM | POA: Diagnosis not present

## 2017-09-13 DIAGNOSIS — I272 Pulmonary hypertension, unspecified: Secondary | ICD-10-CM | POA: Diagnosis not present

## 2017-09-13 DIAGNOSIS — M810 Age-related osteoporosis without current pathological fracture: Secondary | ICD-10-CM

## 2017-09-13 DIAGNOSIS — Z7901 Long term (current) use of anticoagulants: Secondary | ICD-10-CM | POA: Diagnosis not present

## 2017-09-13 DIAGNOSIS — I82402 Acute embolism and thrombosis of unspecified deep veins of left lower extremity: Secondary | ICD-10-CM

## 2017-09-13 DIAGNOSIS — Z7189 Other specified counseling: Secondary | ICD-10-CM

## 2017-09-13 DIAGNOSIS — Z86718 Personal history of other venous thrombosis and embolism: Secondary | ICD-10-CM

## 2017-09-13 DIAGNOSIS — I2692 Saddle embolus of pulmonary artery without acute cor pulmonale: Secondary | ICD-10-CM

## 2017-09-13 LAB — BCR ABL1 FISH (GENPATH)

## 2017-09-13 LAB — JAK2 (INCLUDING V617F AND EXON 12), MPL,& CALR-NEXT GEN SEQ

## 2017-09-13 MED ORDER — HYDROXYUREA 500 MG PO CAPS: 500.0000 mg | ORAL_CAPSULE | Freq: Every day | ORAL | 1 refills | Status: DC

## 2017-09-13 MED ORDER — APIXABAN 2.5 MG PO TABS: 2.5000 mg | ORAL_TABLET | Freq: Two times a day (BID) | ORAL | 11 refills | Status: DC

## 2017-09-13 NOTE — Telephone Encounter (Signed)
Gave patient AVS and calendar of upcoming February appointments.  °

## 2017-09-13 NOTE — Assessment & Plan Note (Signed)
She had history of recurrent thrombosis She is doing well on chronic anticoagulation therapy I recommend she continue the same and indefinitely. I have refilled her prescription today

## 2017-09-13 NOTE — Assessment & Plan Note (Signed)
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

## 2017-09-13 NOTE — Assessment & Plan Note (Addendum)
The patient is aware she has incurable disease and treatment is strictly palliative. However, her overall prognosis is excellent if her disease is well controlled with medications

## 2017-09-13 NOTE — Progress Notes (Signed)
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 We discussed the risk and benefit of bone marrow biopsy and at this point in time, I do not feel strongly she needs to proceed with bone marrow biopsy I recommend hydroxyurea 500 mg daily We discussed the risk, benefits, side effects of hydroxyurea and she agreed to proceed I had further discussion with her daughter and have addressed all her questions and concerns Overall, her prognosis is excellent I recommend starting hydroxyurea on 09/17/2017 and I plan to see her back within a week to review test results In the meantime, she will continue Eliquis indefinitely  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 I have refilled her prescription today  Goals of care, counseling/discussion The patient is aware she has incurable disease and treatment is strictly palliative. However, her overall prognosis is excellent if her disease is well controlled with medications    Orders Placed This Encounter  Procedures  . CBC with Differential/Platelet    Standing Status:   Future    Standing Expiration Date:   10/18/2018  . Comprehensive metabolic panel    Standing Status:   Future    Standing Expiration Date:   10/18/2018  . Lactate dehydrogenase    Standing Status:   Future    Standing Expiration Date:   09/13/2018  .  Uric acid    Standing Status:   Future    Standing Expiration Date:   09/13/2018    INTERVAL HISTORY: Please see below for problem oriented charting. She returns today to discuss test results She just come back from [place] She complained of minor sore throat Denies fever, chills or cough The patient denies any recent signs or symptoms of bleeding such as spontaneous epistaxis, hematuria or hematochezia.  SUMMARY OF ONCOLOGIC HISTORY:  Kimberly Watson  Was referred here after recent diagnosis of severe left lower extremity DVT with bilateral PE and cor pulmonale. I reviewed her records extensively and collaborated the history with the patient. The patient has been taking Evista for over 25 years due to strong family history of breast cancer and for osteoporosis management. In January 2017, she had viral gastroenteritis with severe diarrhea and dehydration. The patient has been taking Lasix chronically for mild bilateral lower extremity edema, left greater than the right. 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 She recalled having profuse diarrhea nonstop for 24 hours leading up to the blood clot event. Of note, prior to the diagnosis, she was not taking aspirin consistently  Prior to that she denies recent history of trauma, long distance travel, recent surgery, smoking or prolonged immobilization. She had no prior history or diagnosis of cancer. Her age appropriate screening programs are up-to-date. She had prior surgeries before and never had perioperative thromboembolic events. The patient had been exposed to birth control pills and never had thrombotic events. The patient had been pregnant before and denies history of peripartum thromboembolic event or history of  recurrent miscarriages. There is no family history of blood clots or miscarriages.  On 08/18/2015, she presented to the emergency department due to symptoms of severe  weakness, chest pressure, shortness of breath and near syncopal episode. She had extensive evaluation in the emergency room. D-dimer was elevated at greater than 20.  CT angiogram dated 08/19/2015 show massive saddle pulmonary embolus with evidence of severe right heart strain.  Left lower extremity venous Doppler on 08/19/2015 showed findings consistent with acute deep vein thrombosis involving the left femoral vein, left popliteal vein, left posterior tibial vein and left peroneal vein.  Echocardiogram on 08/19/2015 show preserved ejection fraction but with right ventricular dilatation, moderate tricuspid valvular regurgitation, oscillating density on TV(thought to be blood clot in transit) and moderate to severe increased right systolic pressure/evidence of severe pulmonary hypertension  The patient was admitted with IV heparin anticoagulation therapy and placement of IVC filter on 08/20/2015.  Echocardiogram was repeated on 08/23/2015 which showed persistent elevated pulmonary hypertension. The mass over the right tricuspid valve is no longer visible She was hospitalized on 08/18/2015 to 08/24/2015. She was discharged home on Eliquis.  Evista was discontinued. 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, BCR/ABL negative, we will plan to start her on hydroxyurea  REVIEW OF SYSTEMS:   Constitutional: Denies fevers, chills or abnormal weight loss Eyes: Denies blurriness of vision Ears, nose, mouth, throat, and face: Denies mucositis Respiratory: Denies cough, dyspnea or wheezes Cardiovascular: Denies palpitation, chest discomfort or lower extremity swelling Gastrointestinal:  Denies nausea, heartburn or change in bowel habits Skin: Denies abnormal skin rashes Lymphatics: Denies new lymphadenopathy or easy bruising Neurological:Denies numbness, tingling  or new weaknesses Behavioral/Psych: Mood is stable, no new changes  All other systems were reviewed with the patient and are negative.  I have reviewed the past medical history, past surgical history, social history and family history with the patient and they are unchanged from previous note.  ALLERGIES:  is allergic to evista [raloxifene] and fosamax [alendronate sodium].  MEDICATIONS:  Current Outpatient Medications  Medication Sig Dispense Refill  . apixaban (ELIQUIS) 2.5 MG TABS tablet Take 1 tablet (2.5 mg total) by mouth 2 (two) times daily. 90 tablet 11  . calcium carbonate (OS-CAL) 600 MG TABS tablet Take 600 mg by mouth daily.    . Cholecalciferol (VITAMIN D PO) Take 5,000 Units by mouth 3 (three) times daily.     . famotidine (PEPCID) 20 MG tablet Take 1 tablet (20 mg total) by mouth 2 (two) times daily. 90 tablet 0  . furosemide (LASIX) 40 MG tablet TAKE 1 TABLET (40 MG TOTAL) BY MOUTH DAILY. 90 tablet 1  . hydroxyurea (HYDREA) 500 MG capsule Take 1 capsule (500 mg total) by mouth daily. May take with food to minimize GI side effects. 30 capsule 1  . MAGNESIUM PO Take 1 tablet by mouth 2 (two) times daily.     . metoprolol succinate (TOPROL-XL) 100 MG 24 hr tablet TAKE 1 TABLET BY MOUTH EVERY DAY TAKE WITH OR IMMEDIATELY AFTER A MEAL (Patient taking differently: TAKE 1 TABLET(139m) BY MOUTH EVERY DAY TAKE WITH OR IMMEDIATELY AFTER A MEAL) 90 tablet 1  . nystatin (MYCOSTATIN/NYSTOP) 100000 UNIT/GM POWD Apply to affected area after a shower daily or as needed 60 g 2  . pantoprazole (PROTONIX) 20 MG tablet Take 1 tablet (20 mg total) by mouth daily. 90 tablet  0  . sucralfate (CARAFATE) 1 GM/10ML suspension Take 10 mLs (1 g total) by mouth 4 (four) times daily -  with meals and at bedtime. 420 mL 0   No current facility-administered medications for this visit.     PHYSICAL EXAMINATION: ECOG PERFORMANCE STATUS: 1 - Symptomatic but completely ambulatory  Vitals:   09/13/17 1526   BP: (!) 131/93  Pulse: 77  Resp: 18  Temp: 97.9 F (36.6 C)  SpO2: 95%   Filed Weights   09/13/17 1526  Weight: 167 lb 12.8 oz (76.1 kg)    GENERAL:alert, no distress and comfortable SKIN: skin color, texture, turgor are normal, no rashes or significant lesions EYES: normal, Conjunctiva are pink and non-injected, sclera clear OROPHARYNX:no exudate, no erythema and lips, buccal mucosa, and tongue normal  NECK: supple, thyroid normal size, non-tender, without nodularity LYMPH:  no palpable lymphadenopathy in the cervical, axillary or inguinal LUNGS: clear to auscultation and percussion with normal breathing effort HEART: regular rate & rhythm and no murmurs and no lower extremity edema ABDOMEN:abdomen soft, non-tender and normal bowel sounds Musculoskeletal:no cyanosis of digits and no clubbing  NEURO: alert & oriented x 3 with fluent speech, no focal motor/sensory deficits  LABORATORY DATA:  I have reviewed the data as listed    Component Value Date/Time   NA 137 08/03/2017 2006   K 3.0 (L) 08/03/2017 2006   CL 101 08/03/2017 2006   CO2 23 08/03/2017 2006   GLUCOSE 106 (H) 08/03/2017 2006   BUN 18 08/03/2017 2006   CREATININE 1.00 08/03/2017 2006   CREATININE 0.92 06/06/2017 1059   CALCIUM 8.8 (L) 08/03/2017 2006   PROT 6.7 08/03/2017 2006   ALBUMIN 3.7 08/03/2017 2006   AST 30 08/03/2017 2006   ALT 22 08/03/2017 2006   ALKPHOS 76 08/03/2017 2006   BILITOT 0.6 08/03/2017 2006   GFRNONAA 53 (L) 08/03/2017 2006   GFRNONAA 60 06/06/2017 1059   GFRAA >60 08/03/2017 2006   GFRAA 70 06/06/2017 1059    No results found for: SPEP, UPEP  Lab Results  Component Value Date   WBC 9.1 08/27/2017   NEUTROABS 5.6 08/27/2017   HGB 15.7 08/27/2017   HCT 48.5 (H) 08/27/2017   MCV 84.5 08/27/2017   PLT 544 (H) 08/27/2017      Chemistry      Component Value Date/Time   NA 137 08/03/2017 2006   K 3.0 (L) 08/03/2017 2006   CL 101 08/03/2017 2006   CO2 23 08/03/2017 2006    BUN 18 08/03/2017 2006   CREATININE 1.00 08/03/2017 2006   CREATININE 0.92 06/06/2017 1059      Component Value Date/Time   CALCIUM 8.8 (L) 08/03/2017 2006   ALKPHOS 76 08/03/2017 2006   AST 30 08/03/2017 2006   ALT 22 08/03/2017 2006   BILITOT 0.6 08/03/2017 2006      All questions were answered. The patient knows to call the clinic with any problems, questions or concerns. No barriers to learning was detected.  I spent 40 minutes counseling the patient face to face. The total time spent in the appointment was 55 minutes and more than 50% was on counseling and review of test results  [name], MD 09/13/2017 3:59 PM

## 2017-09-13 NOTE — Patient Instructions (Signed)
Hydroxyurea capsules What is this medicine? HYDROXYUREA (hye drox ee yoor EE a) is a chemotherapy drug. This medicine is used to treat certain types of leukemias and head and neck cancer. It is also used to control the painful crises of sickle cell anemia. This medicine may be used for other purposes; ask your health care provider or pharmacist if you have questions. COMMON BRAND NAME(S): Droxia, Hydrea What should I tell my health care provider before I take this medicine? They need to know if you have any of these conditions: -gout or high levels of uric acid in the blood -HIV or AIDS -kidney disease or on hemodialysis -leg wounds or ulcers -low blood counts, like low white cell, platelet, or red cell counts -prior or current interferon therapy -recent or ongoing radiation therapy -scheduled to receive a vaccine -an unusual or allergic reaction to hydroxyurea, other medicines, foods, dyes, or preservatives -pregnant or trying to get pregnant -breast-feeding How should I use this medicine? Take this medicine by mouth with a glass of water. Follow the directions on the prescription label. Take your medicine at regular intervals. Do not take it more often than directed. Do not stop taking except on your doctor's advice. People who are not taking this medicine should not be exposed to it. Wash your hands before and after handling your bottle or medicine. Caregivers should wear disposable gloves if they must touch the bottle or medicine. Clean up any medicine powder that spills with a damp disposable towel and throw the towel away in a closed container, such as a plastic bag. A special MedGuide will be given to you by the pharmacist with each prescription and refill of Droxyia. Be sure to read this information carefully each time. Talk to your pediatrician regarding the use of this medicine in children. Special care may be needed. Overdosage: If you think you have taken too much of this medicine  contact a poison control center or emergency room at once. NOTE: This medicine is only for you. Do not share this medicine with others. What if I miss a dose? If you miss a dose, take it as soon as you can. If it is almost time for your next dose, take only that dose. Do not take double or extra doses. What may interact with this medicine? This medicine may also interact with the following medications: -didanosine -stavudine -live virus vaccines This list may not describe all possible interactions. Give your health care provider a list of all the medicines, herbs, non-prescription drugs, or dietary supplements you use. Also tell them if you smoke, drink alcohol, or use illegal drugs. Some items may interact with your medicine. What should I watch for while using this medicine? This drug may make you feel generally unwell. This is not uncommon, as chemotherapy can affect healthy cells as well as cancer cells. Report any side effects. Continue your course of treatment even though you feel ill unless your doctor tells you to stop. You will receive regular blood tests during your treatment. Call your doctor or health care professional for advice if you get a fever, chills or sore throat, or other symptoms of a cold or flu. Do not treat yourself. This drug decreases your body's ability to fight infections. Try to avoid being around people who are sick. This medicine may increase your risk to bruise or bleed. Call your doctor or health care professional if you notice any unusual bleeding. Talk to your doctor about your risk of cancer. You may  be more at risk for certain types of cancers if you take this medicine. Keep out of the sun. If you cannot avoid being in the sun, wear protective clothing and use sunscreen. Do not use sun lamps or tanning beds/booths. Do not become pregnant while taking this medicine or for at least 6 months after stopping it. Women should inform their doctor if they wish to become  pregnant or think they might be pregnant. Men should not father a child while taking this medicine and for at least a year after stopping it. There is a potential for serious side effects to an unborn child. Talk to your health care professional or pharmacist for more information. Do not breast-feed an infant while taking this medicine. This may interfere with the ability to have or father a child. You should talk with your doctor or health care professional if you are concerned about your fertility. What side effects may I notice from receiving this medicine? Side effects that you should report to your doctor or health care professional as soon as possible: -allergic reactions like skin rash, itching or hives, swelling of the face, lips, or tongue -breathing problems -burning, redness or pain at the site of any radiation therapy -low blood counts - this medicine may decrease the number of white blood cells, red blood cells and platelets. You may be at increased risk for infections and bleeding. -signs of decreased platelets or bleeding - bruising, pinpoint red spots on the skin, black, tarry stools, blood in the urine -signs of decreased red blood cells - unusually weak or tired, fainting spells, lightheadedness -signs of infection - fever or chills, cough, sore throat, pain or difficulty passing urine -signs and symptoms of bleeding such as bloody or black, tarry stools; red or dark-brown urine; spitting up blood or brown material that looks like coffee grounds; red spots on the skin; unusual bruising or bleeding from the eye, gums, or nose -skin ulcers Side effects that usually do not require medical attention (report to your doctor or health care professional if they continue or are bothersome): -constipation -diarrhea -loss of appetite -mouth sores -nausea This list may not describe all possible side effects. Call your doctor for medical advice about side effects. You may report side effects  to FDA at 1-800-FDA-1088. Where should I keep my medicine? Keep out of the reach of children. See product for storage instructions. Each product may have different instructions. Keep tightly closed. Throw away any unused medicine after the expiration date. NOTE: This sheet is a summary. It may not cover all possible information. If you have questions about this medicine, talk to your doctor, pharmacist, or health care provider.  2018 Elsevier/Gold Standard (2016-07-20 11:43:13)

## 2017-09-25 ENCOUNTER — Inpatient Hospital Stay: Payer: Medicare Other

## 2017-09-25 ENCOUNTER — Inpatient Hospital Stay: Payer: Medicare Other | Admitting: Hematology and Oncology

## 2017-09-25 ENCOUNTER — Encounter: Payer: Self-pay | Admitting: Hematology and Oncology

## 2017-09-25 ENCOUNTER — Telehealth: Payer: Self-pay | Admitting: Hematology and Oncology

## 2017-09-25 DIAGNOSIS — I2692 Saddle embolus of pulmonary artery without acute cor pulmonale: Secondary | ICD-10-CM | POA: Diagnosis not present

## 2017-09-25 DIAGNOSIS — D471 Chronic myeloproliferative disease: Secondary | ICD-10-CM | POA: Diagnosis not present

## 2017-09-25 DIAGNOSIS — Z79899 Other long term (current) drug therapy: Secondary | ICD-10-CM

## 2017-09-25 DIAGNOSIS — M810 Age-related osteoporosis without current pathological fracture: Secondary | ICD-10-CM

## 2017-09-25 DIAGNOSIS — Z7901 Long term (current) use of anticoagulants: Secondary | ICD-10-CM | POA: Diagnosis not present

## 2017-09-25 DIAGNOSIS — Z86718 Personal history of other venous thrombosis and embolism: Secondary | ICD-10-CM

## 2017-09-25 DIAGNOSIS — I82402 Acute embolism and thrombosis of unspecified deep veins of left lower extremity: Secondary | ICD-10-CM

## 2017-09-25 DIAGNOSIS — I272 Pulmonary hypertension, unspecified: Secondary | ICD-10-CM | POA: Diagnosis not present

## 2017-09-25 LAB — URIC ACID: Uric Acid, Serum: 7.3 mg/dL (ref 2.6–7.4)

## 2017-09-25 LAB — CBC WITH DIFFERENTIAL/PLATELET
Basophils Absolute: 0.1 10*3/uL (ref 0.0–0.1)
Basophils Relative: 2 %
Eosinophils Absolute: 0.3 10*3/uL (ref 0.0–0.5)
Eosinophils Relative: 4 %
HCT: 42.9 % (ref 34.8–46.6)
Hemoglobin: 14.4 g/dL (ref 11.6–15.9)
Lymphocytes Relative: 28 %
Lymphs Abs: 2.2 10*3/uL (ref 0.9–3.3)
MCH: 28.6 pg (ref 25.1–34.0)
MCHC: 33.5 g/dL (ref 31.5–36.0)
MCV: 85.5 fL (ref 79.5–101.0)
Monocytes Absolute: 0.6 10*3/uL (ref 0.1–0.9)
Monocytes Relative: 8 %
Neutro Abs: 4.5 10*3/uL (ref 1.5–6.5)
Neutrophils Relative %: 58 %
Platelets: 494 10*3/uL — ABNORMAL HIGH (ref 145–400)
RBC: 5.02 MIL/uL (ref 3.70–5.45)
RDW: 16.2 % — ABNORMAL HIGH (ref 11.2–14.5)
WBC: 7.8 10*3/uL (ref 3.9–10.3)

## 2017-09-25 LAB — COMPREHENSIVE METABOLIC PANEL
ALT: 16 U/L (ref 0–55)
AST: 20 U/L (ref 5–34)
Albumin: 3.5 g/dL (ref 3.5–5.0)
Alkaline Phosphatase: 75 U/L (ref 40–150)
Anion gap: 7 (ref 3–11)
BUN: 28 mg/dL — ABNORMAL HIGH (ref 7–26)
CO2: 28 mmol/L (ref 22–29)
Calcium: 9.5 mg/dL (ref 8.4–10.4)
Chloride: 106 mmol/L (ref 98–109)
Creatinine, Ser: 1.18 mg/dL — ABNORMAL HIGH (ref 0.60–1.10)
GFR calc Af Amer: 50 mL/min — ABNORMAL LOW (ref >60)
GFR calc non Af Amer: 43 mL/min — ABNORMAL LOW (ref >60)
Glucose, Bld: 91 mg/dL (ref 70–140)
Potassium: 4.4 mmol/L (ref 3.5–5.1)
Sodium: 141 mmol/L (ref 136–145)
Total Bilirubin: 0.8 mg/dL (ref 0.2–1.2)
Total Protein: 7.2 g/dL (ref 6.4–8.3)

## 2017-09-25 LAB — LACTATE DEHYDROGENASE: LDH: 180 U/L (ref 125–245)

## 2017-09-25 NOTE — Telephone Encounter (Signed)
Gave avs and calendar for march °

## 2017-09-25 NOTE — Assessment & Plan Note (Signed)
So far, she tolerated hydroxyurea well She has normalization of hemoglobin and hematocrit Platelet counts are improving I plan to see her back again next month for further review of her blood test If her blood counts are stable, I will space out her follow-up appointment

## 2017-09-25 NOTE — Progress Notes (Signed)
[facility] OFFICE PROGRESS NOTE  Patient Care Team: Unk Pinto, MD as PCP - General (Internal Medicine) Monna Fam, MD as Consulting Physician (Ophthalmology) Gatha Mayer, MD as Consulting Physician (Gastroenterology) Kathie Rhodes, MD as Consulting Physician (Urology) Meisinger, Sherren Mocha, MD as Consulting Physician (Obstetrics and Gynecology)  ASSESSMENT & PLAN:  Myeloproliferative disorder [facility]) So far, she tolerated hydroxyurea well She has normalization of hemoglobin and hematocrit Platelet counts are improving I plan to see her back again next month for further review of her blood test If her blood counts are stable, I will space out her follow-up appointment   DVT (deep venous thrombosis) ([place]) She had history of recurrent thrombosis She is doing well on chronic anticoagulation therapy I recommend she continue the same and indefinitely.   No orders of the defined types were placed in this encounter.   INTERVAL HISTORY: Please see below for problem oriented charting. She returns for further follow-up She started taking hydroxyurea on 09/17/2017 She tolerated treatment well No recent infection No recent nausea vomiting The patient denies any recent signs or symptoms of bleeding such as spontaneous epistaxis, hematuria or hematochezia.  SUMMARY OF ONCOLOGIC HISTORY: Kimberly Watson  Was referred here after recent diagnosis of severe left lower extremity DVT with bilateral PE and cor pulmonale. I reviewed her records extensively and collaborated the history with the patient. The patient has been taking Evista for over 25 years due to strong family history of breast cancer and for osteoporosis management. In January 2017, she had viral gastroenteritis with severe diarrhea and dehydration. The patient has been taking Lasix chronically for mild bilateral lower extremity edema, left greater than the right. She has chronic left lower extremity swelling  since her left knee replacement surgery in 2001. She recalled having profuse diarrhea nonstop for 24 hours leading up to the blood clot event. Of note, prior to the diagnosis, she was not taking aspirin consistently  Prior to that she denies recent history of trauma, long distance travel, recent surgery, smoking or prolonged immobilization. She had no prior history or diagnosis of cancer. Her age appropriate screening programs are up-to-date. She had prior surgeries before and never had perioperative thromboembolic events. The patient had been exposed to birth control pills and never had thrombotic events. The patient had been pregnant before and denies history of peripartum thromboembolic event or history of recurrent miscarriages. There is no family history of blood clots or miscarriages.  On 08/18/2015, she presented to the emergency department due to symptoms of severe weakness, chest pressure, shortness of breath and near syncopal episode. She had extensive evaluation in the emergency room. D-dimer was elevated at greater than 20.  CT angiogram dated 08/19/2015 show massive saddle pulmonary embolus with evidence of severe right heart strain.  Left lower extremity venous Doppler on 08/19/2015 showed findings consistent with acute deep vein thrombosis involving the left femoral vein, left popliteal vein, left posterior tibial vein and left peroneal vein.  Echocardiogram on 08/19/2015 show preserved ejection fraction but with right ventricular dilatation, moderate tricuspid valvular regurgitation, oscillating density on TV(thought to be blood clot in transit) and moderate to severe increased right systolic pressure/evidence of severe pulmonary hypertension  The patient was admitted with IV heparin anticoagulation therapy and placement of IVC filter on 08/20/2015.  Echocardiogram was repeated on 08/23/2015 which showed persistent elevated pulmonary hypertension. The mass over the right tricuspid  valve is no longer visible She was hospitalized on 08/18/2015 to 08/24/2015. She was discharged home on  Eliquis.  Evista was discontinued. 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 On 09/17/2017, she started taking hydroxyurea 500 mg daily  REVIEW OF SYSTEMS:   Constitutional: Denies fevers, chills or abnormal weight loss Eyes: Denies blurriness of vision Ears, nose, mouth, throat, and face: Denies mucositis or sore throat Respiratory: Denies cough, dyspnea or wheezes Cardiovascular: Denies palpitation, chest discomfort or lower extremity swelling Gastrointestinal:  Denies nausea, heartburn or change in bowel habits Skin: Denies abnormal skin rashes Lymphatics: Denies new lymphadenopathy or easy bruising Neurological:Denies numbness, tingling or new weaknesses Behavioral/Psych: Mood is stable, no new changes  All other systems were reviewed with the patient and are negative.  I have reviewed the past medical history, past surgical history, social history and family history with the patient and they are unchanged from previous note.  ALLERGIES:  is allergic to evista [raloxifene] and fosamax [alendronate sodium].  MEDICATIONS:  Current Outpatient Medications  Medication Sig Dispense Refill  . apixaban (ELIQUIS) 2.5 MG TABS tablet Take 1 tablet (2.5 mg total) by mouth 2 (two) times daily. 90 tablet 11  . calcium carbonate (OS-CAL) 600 MG TABS tablet Take 600 mg by mouth daily.    . Cholecalciferol (VITAMIN D PO) Take 5,000 Units by mouth 3 (three) times daily.     . famotidine (PEPCID) 20 MG tablet Take 1 tablet (20 mg total) by mouth 2 (two) times daily. 90 tablet 0  . furosemide (LASIX) 40 MG tablet TAKE 1 TABLET (40 MG TOTAL) BY  MOUTH DAILY. 90 tablet 1  . hydroxyurea (HYDREA) 500 MG capsule Take 1 capsule (500 mg total) by mouth daily. May take with food to minimize GI side effects. 30 capsule 1  . MAGNESIUM PO Take 1 tablet by mouth 2 (two) times daily.     . metoprolol succinate (TOPROL-XL) 100 MG 24 hr tablet TAKE 1 TABLET BY MOUTH EVERY DAY TAKE WITH OR IMMEDIATELY AFTER A MEAL (Patient taking differently: TAKE 1 TABLET(100mg ) BY MOUTH EVERY DAY TAKE WITH OR IMMEDIATELY AFTER A MEAL) 90 tablet 1  . nystatin (MYCOSTATIN/NYSTOP) 100000 UNIT/GM POWD Apply to affected area after a shower daily or as needed 60 g 2  . pantoprazole (PROTONIX) 20 MG tablet Take 1 tablet (20 mg total) by mouth daily. 90 tablet 0  . sucralfate (CARAFATE) 1 GM/10ML suspension Take 10 mLs (1 g total) by mouth 4 (four) times daily -  with meals and at bedtime. 420 mL 0   No current facility-administered medications for this visit.     PHYSICAL EXAMINATION: ECOG PERFORMANCE STATUS: 0 - Asymptomatic  Vitals:   09/25/17 1122  BP: (!) 156/80  Pulse: 68  Resp: 18  Temp: 98 F (36.7 C)  SpO2: 96%   Filed Weights   09/25/17 1122  Weight: 169 lb 1.6 oz (76.7 kg)    GENERAL:alert, no distress and comfortable NEURO: alert & oriented x 3 with fluent speech, no focal motor/sensory deficits  LABORATORY DATA:  I have reviewed the data as listed    Component Value Date/Time   NA 141 09/25/2017 1102   K 4.4 09/25/2017 1102   CL 106 09/25/2017 1102   CO2 28 09/25/2017 1102   GLUCOSE 91 09/25/2017 1102   BUN 28 (H) 09/25/2017 1102   CREATININE 1.18 (H) 09/25/2017 1102   CREATININE 0.92  06/06/2017 1059   CALCIUM 9.5 09/25/2017 1102   PROT 7.2 09/25/2017 1102   ALBUMIN 3.5 09/25/2017 1102   AST 20 09/25/2017 1102   ALT 16 09/25/2017 1102   ALKPHOS 75 09/25/2017 1102   BILITOT 0.8 09/25/2017 1102   GFRNONAA 43 (L) 09/25/2017 1102   GFRNONAA 60 06/06/2017 1059   GFRAA 50 (L) 09/25/2017 1102   GFRAA 70 06/06/2017 1059    No results  found for: SPEP, UPEP  Lab Results  Component Value Date   WBC 7.8 09/25/2017   NEUTROABS 4.5 09/25/2017   HGB 14.4 09/25/2017   HCT 42.9 09/25/2017   MCV 85.5 09/25/2017   PLT 494 (H) 09/25/2017      Chemistry      Component Value Date/Time   NA 141 09/25/2017 1102   K 4.4 09/25/2017 1102   CL 106 09/25/2017 1102   CO2 28 09/25/2017 1102   BUN 28 (H) 09/25/2017 1102   CREATININE 1.18 (H) 09/25/2017 1102   CREATININE 0.92 06/06/2017 1059      Component Value Date/Time   CALCIUM 9.5 09/25/2017 1102   ALKPHOS 75 09/25/2017 1102   AST 20 09/25/2017 1102   ALT 16 09/25/2017 1102   BILITOT 0.8 09/25/2017 1102       All questions were answered. The patient knows to call the clinic with any problems, questions or concerns. No barriers to learning was detected.  I spent 10 minutes counseling the patient face to face. The total time spent in the appointment was 15 minutes and more than 50% was on counseling and review of test results  [name], MD 09/25/2017 11:54 AM

## 2017-09-25 NOTE — Assessment & Plan Note (Signed)
She had history of recurrent thrombosis She is doing well on chronic anticoagulation therapy I recommend she continue the same and indefinitely.

## 2017-10-08 ENCOUNTER — Encounter: Payer: Self-pay | Admitting: Internal Medicine

## 2017-10-08 ENCOUNTER — Ambulatory Visit: Payer: Medicare Other | Admitting: Internal Medicine

## 2017-10-08 ENCOUNTER — Telehealth: Payer: Self-pay

## 2017-10-08 VITALS — BP 124/82 | HR 78 | Ht 62.5 in | Wt 170.8 lb

## 2017-10-08 DIAGNOSIS — K449 Diaphragmatic hernia without obstruction or gangrene: Secondary | ICD-10-CM

## 2017-10-08 DIAGNOSIS — Z1211 Encounter for screening for malignant neoplasm of colon: Secondary | ICD-10-CM

## 2017-10-08 DIAGNOSIS — Z7901 Long term (current) use of anticoagulants: Secondary | ICD-10-CM

## 2017-10-08 DIAGNOSIS — R1319 Other dysphagia: Secondary | ICD-10-CM

## 2017-10-08 DIAGNOSIS — R1310 Dysphagia, unspecified: Secondary | ICD-10-CM

## 2017-10-08 NOTE — Telephone Encounter (Signed)
Thank you so much for your time.

## 2017-10-08 NOTE — Telephone Encounter (Signed)
[facility] [address] [place] [address]  10/08/2017   RE: Kimberly Watson DOB: [birth year] MRN: [id number]   Dear Dr [name],    We have scheduled the above patient for an endoscopic procedure. Our records show that she is on anticoagulation therapy.   Please advise as to how long the patient may come off her therapy of Eliquis prior to the EGD/colonoscopy procedure, which is scheduled for 11/01/17.  Please fax back/ or route the completed form to [facility]stein [place], [place] at [phone].   Sincerely,    Dr Silvano Rusk

## 2017-10-08 NOTE — Patient Instructions (Addendum)
You have been scheduled for an endoscopy and colonoscopy. Please follow the written instructions given to you at your visit today. Please pick up your prep supplies at the pharmacy within the next 1-3 days. If you use inhalers (even only as needed), please bring them with you on the day of your procedure. Your physician has requested that you go to www.startemmi.com and enter the access code given to you at your visit today. This web site gives a general overview about your procedure. However, you should still follow specific instructions given to you by our office regarding your preparation for the procedure.  You will be contacted by our office prior to your procedure for directions on holding your Eliquis.  If you do not hear from our office 1 week prior to your scheduled procedure, please call [phone] to discuss.     I appreciate the opportunity to care for you. Silvano Rusk, MD, [facility]

## 2017-10-08 NOTE — Progress Notes (Signed)
Kimberly Watson 77 y.o. 07-Aug-1940 [id number]  Assessment & Plan:   Encounter Diagnoses  Name Primary?  . Esophageal dysphagia Yes  . Hiatal hernia   . Colon cancer screening   . 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  Screening colonoscopy will also be scheduled.  The risks and benefits as well as alternatives of endoscopic procedure(s) have been discussed and reviewed. All questions answered. The patient agrees to proceed.  There is an increased risk of the procedures due to her ongoing anticoagulant therapy.  I think it best that she hold Eliquis 2 days prior to her procedures, there is a chance of esophageal dilation or perhaps hot snare polypectomy in the colon which in either case could increase the risk of periprocedural bleeding on Eliquis.  She understands is a very rare but real risk of developing another blood clot when she is off her Eliquis.  We will coordinate and clarify with Dr. Alvy Bimler  to make sure there are no unforeseen issues with doing this.   I appreciate the opportunity to care for her. NL:[id number], Gwyndolyn Saxon, MD Dr. Alvy Bimler  Subjective:   Chief Complaint: Hiatal hernia, colon cancer screening  HPI The patient is a very nice 77 year old white woman who has a remote history of an EGD and colonoscopy in 2005 or earlier (records not available), and says she has a history of hiatal hernia and esophageal stricture, who is here because of colon cancer screening and recent problems with some chest and epigastric pain.  She reports that in early January after eating shrimp cocktail and ice tea she developed epigastric and chest pain burning in character she actually went to the emergency department (documentation and labs reviewed).  Since then she has been on a low saturated fat, low spicy food low acidic food diet and is better.  She took some decaffeinated tea at one  point and had a spike in symptoms transiently.  When she drinks cold liquids or iced liquids she will sometimes get similar symptoms with chest pain.  She relates a history of being dilated twice in the past related to Fosamax and strictures of the esophagus.  It sounds like she has a bit of a globus sensation at times, and there is vague esophageal dysphagia intermittently though it may have been a while since she has had that.  She remarks that with her osteoporosis she is shrinking and getting more kyphosis.  That is evident on her x-ray from the ER and I think there is a hiatal hernia seen there as well though not mentioned.  Previous colonoscopy negative for polyps.  She received a letter for follow-up but in the interim she developed blood clots and turned out to have a myeloproliferative disorder and is on Hydrea and Eliquis coordinated by Dr. Alvy Bimler.  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 Allergies  Allergen Reactions  . Evista [Raloxifene] Other (See Comments)    Blood clots - DVT and Pulmonary Embolism   . Fosamax [Alendronate Sodium]     esophagitis   Current Meds  Medication Sig  . apixaban (ELIQUIS) 2.5 MG TABS tablet Take 1 tablet (2.5 mg total) by mouth 2 (two) times daily.  . calcium carbonate (OS-CAL) 600 MG TABS tablet Take 600 mg by mouth daily.  . Cholecalciferol (VITAMIN D PO) Take 5,000 Units by mouth  3 (three) times daily.   . furosemide (LASIX) 40 MG tablet TAKE 1 TABLET (40 MG TOTAL) BY MOUTH DAILY.  . hydroxyurea (HYDREA) 500 MG capsule Take 1 capsule (500 mg total) by mouth daily. May take with food to minimize GI side effects.  Marland Kitchen MAGNESIUM PO Take 1 tablet by mouth 2 (two) times daily.   . metoprolol succinate (TOPROL-XL) 100 MG 24 hr tablet TAKE 1 TABLET BY MOUTH EVERY DAY TAKE WITH OR IMMEDIATELY AFTER A MEAL (Patient taking differently: TAKE 1 TABLET(100mg ) BY MOUTH EVERY DAY TAKE WITH OR IMMEDIATELY  AFTER A MEAL)   Past Medical History:  Diagnosis Date  . Cataract   . DVT of lower extremity (deep venous thrombosis) ([place])    left, resolved  . Elevated hemoglobin A1c   . GERD (gastroesophageal reflux disease)   . H/O left knee surgery   . Hypertension   . JAK2 gene mutation   . Leiomyoma    adnexal Leiomyoma removal in 1/11  . Myeloproliferative disorder ([place])    ? P vera  . Nephrolithiasis   . Osteoporosis   . PE (pulmonary embolism) 08/2015   Past Surgical History:  Procedure Laterality Date  . CHOLECYSTECTOMY    . IVC FILTER PLACEMENT ([facility] HX)  08/2015  . JOINT REPLACEMENT Left total knee  . UTERINE FIBROID SURGERY     Social History   Social History Narrative   Divorced, 2 children lives alone.  Lived with a smoker for years.    Retired Radio producer   No alcohol tobacco or drug use   10/08/2017      family history includes Breast cancer in her mother; Congestive Heart Failure in her mother; Diabetes in her mother; Hypertension in her mother; Osteoporosis in her father.   Review of Systems Allergies, headaches, joint pains or arthritis fatigue some dyspnea anxiety symptoms at times all other review of systems negative  Objective:   Physical Exam @BP  124/82   Pulse 78   Ht 5' 2.5" (1.588 m)   Wt 170 lb 12.8 oz (77.5 kg)   BMI 30.74 kg/m @  General:  Well-developed, well-nourished and in no acute distress Eyes:  anicteric. ENT:   Mouth and posterior pharynx free of lesions.  Neck:   supple w/o thyromegaly or mass.  Lungs: Clear to auscultation bilaterally. Heart:  S1S2, no rubs, murmurs, gallops. Abdomen:  soft, non-tender, no hepatosplenomegaly, hernia, or mass and BS+.  Rectal: deferred Lymph:  no cervical or supraclavicular adenopathy. Extremities:   no edema, cyanosis or clubbing Skin   no rash. Neuro:  A&O x 3.  Psych:  appropriate mood and  Affect.   Data Reviewed: See HPI.  I have also reviewed images from the chest x-ray from January of  this year as well as CT scan abdomen and pelvis from 2010 which demonstrated hiatal hernia.

## 2017-10-08 NOTE — Telephone Encounter (Signed)
I am seeing her next week and will provide letter then

## 2017-10-11 ENCOUNTER — Other Ambulatory Visit: Payer: Self-pay | Admitting: Internal Medicine

## 2017-10-12 ENCOUNTER — Other Ambulatory Visit: Payer: Self-pay | Admitting: Hematology and Oncology

## 2017-10-12 DIAGNOSIS — D471 Chronic myeloproliferative disease: Secondary | ICD-10-CM

## 2017-10-15 ENCOUNTER — Inpatient Hospital Stay: Payer: Medicare Other

## 2017-10-15 ENCOUNTER — Encounter: Payer: Self-pay | Admitting: Hematology and Oncology

## 2017-10-15 ENCOUNTER — Telehealth: Payer: Self-pay | Admitting: Hematology and Oncology

## 2017-10-15 ENCOUNTER — Inpatient Hospital Stay: Payer: Medicare Other | Attending: Hematology and Oncology | Admitting: Hematology and Oncology

## 2017-10-15 DIAGNOSIS — I82402 Acute embolism and thrombosis of unspecified deep veins of left lower extremity: Secondary | ICD-10-CM

## 2017-10-15 DIAGNOSIS — D471 Chronic myeloproliferative disease: Secondary | ICD-10-CM | POA: Diagnosis not present

## 2017-10-15 DIAGNOSIS — M7989 Other specified soft tissue disorders: Secondary | ICD-10-CM | POA: Diagnosis not present

## 2017-10-15 DIAGNOSIS — M810 Age-related osteoporosis without current pathological fracture: Secondary | ICD-10-CM | POA: Insufficient documentation

## 2017-10-15 DIAGNOSIS — Z7901 Long term (current) use of anticoagulants: Secondary | ICD-10-CM

## 2017-10-15 DIAGNOSIS — Z79899 Other long term (current) drug therapy: Secondary | ICD-10-CM | POA: Insufficient documentation

## 2017-10-15 DIAGNOSIS — Z86711 Personal history of pulmonary embolism: Secondary | ICD-10-CM | POA: Insufficient documentation

## 2017-10-15 DIAGNOSIS — D473 Essential (hemorrhagic) thrombocythemia: Secondary | ICD-10-CM | POA: Diagnosis not present

## 2017-10-15 DIAGNOSIS — Z86718 Personal history of other venous thrombosis and embolism: Secondary | ICD-10-CM | POA: Insufficient documentation

## 2017-10-15 DIAGNOSIS — Z803 Family history of malignant neoplasm of breast: Secondary | ICD-10-CM | POA: Insufficient documentation

## 2017-10-15 LAB — CBC WITH DIFFERENTIAL/PLATELET
Basophils Absolute: 0.1 10*3/uL (ref 0.0–0.1)
Basophils Relative: 2 %
Eosinophils Absolute: 0.4 10*3/uL (ref 0.0–0.5)
Eosinophils Relative: 5 %
HCT: 46.3 % (ref 34.8–46.6)
Hemoglobin: 14.8 g/dL (ref 11.6–15.9)
Lymphocytes Relative: 38 %
Lymphs Abs: 3.2 10*3/uL (ref 0.9–3.3)
MCH: 29 pg (ref 25.1–34.0)
MCHC: 32 g/dL (ref 31.5–36.0)
MCV: 90.6 fL (ref 79.5–101.0)
Monocytes Absolute: 0.7 10*3/uL (ref 0.1–0.9)
Monocytes Relative: 9 %
Neutro Abs: 4 10*3/uL (ref 1.5–6.5)
Neutrophils Relative %: 46 %
Platelets: 480 10*3/uL — ABNORMAL HIGH (ref 145–400)
RBC: 5.11 MIL/uL (ref 3.70–5.45)
RDW: 17.8 % — ABNORMAL HIGH (ref 11.2–14.5)
WBC: 8.4 10*3/uL (ref 3.9–10.3)

## 2017-10-15 NOTE — Telephone Encounter (Signed)
Gave avs and calendar ° °

## 2017-10-15 NOTE — Assessment & Plan Note (Signed)
She had history of recurrent thrombosis She is doing well on chronic anticoagulation therapy I recommend she continue the same and indefinitely. There is no contraindication for her to proceed with colonoscopy

## 2017-10-15 NOTE — Progress Notes (Signed)
[place] OFFICE PROGRESS NOTE  Patient Care Team: Unk Pinto, MD as PCP - General (Internal Medicine) Monna Fam, MD as Consulting Physician (Ophthalmology) Gatha Mayer, MD as Consulting Physician (Gastroenterology) Kathie Rhodes, MD as Consulting Physician (Urology) Meisinger, Sherren Mocha, MD as Consulting Physician (Obstetrics and Gynecology)  ASSESSMENT & PLAN:  Myeloproliferative disorder [facility]) So far, she tolerated hydroxyurea well She has normalization of hemoglobin and hematocrit Platelet counts are improving I plan to see her back again in 2 month for further review of her blood test  DVT (deep venous thrombosis) ([place]) She had history of recurrent thrombosis She is doing well on chronic anticoagulation therapy I recommend she continue the same and indefinitely. There is no contraindication for her to proceed with colonoscopy   No orders of the defined types were placed in this encounter.   INTERVAL HISTORY: Please see below for problem oriented charting. She returns for further follow-up She tolerated recent treatment well She denies recent infection No side effects from hydroxyurea The patient denies any recent signs or symptoms of bleeding such as spontaneous epistaxis, hematuria or hematochezia.   SUMMARY OF ONCOLOGIC HISTORY:  Kimberly Watson  Was referred here after recent diagnosis of severe left lower extremity DVT with bilateral PE and cor pulmonale. I reviewed her records extensively and collaborated the history with the patient. The patient has been taking Evista for over 25 years due to strong family history of breast cancer and for osteoporosis management. In January 2017, she had viral gastroenteritis with severe diarrhea and dehydration. The patient has been taking Lasix chronically for mild bilateral lower extremity edema, left greater than the right. 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 She recalled having profuse diarrhea nonstop for 24 hours leading up to the blood clot event. Of note, prior to the diagnosis, she was not taking aspirin consistently  Prior to that she denies recent history of trauma, long distance travel, recent surgery, smoking or prolonged immobilization. She had no prior history or diagnosis of cancer. Her age appropriate screening programs are up-to-date. She had prior surgeries before and never had perioperative thromboembolic events. The patient had been exposed to birth control pills and never had thrombotic events. The patient had been pregnant before and denies history of peripartum thromboembolic event or history of recurrent miscarriages. There is no family history of blood clots or miscarriages.  On 08/18/2015, she presented to the emergency department due to symptoms of severe weakness, chest pressure, shortness of breath and near syncopal episode. She had extensive evaluation in the emergency room. D-dimer was elevated at greater than 20.  CT angiogram dated 08/19/2015 show massive saddle pulmonary embolus with evidence of severe right heart strain.  Left lower extremity venous Doppler on 08/19/2015 showed findings consistent with acute deep vein thrombosis involving the left femoral vein, left popliteal vein, left posterior tibial vein and left peroneal vein.  Echocardiogram on 08/19/2015 show preserved ejection fraction but with right ventricular dilatation, moderate tricuspid valvular regurgitation, oscillating density on TV(thought to be blood clot in transit) and moderate to severe increased right systolic pressure/evidence of severe pulmonary hypertension  The patient was admitted with IV heparin anticoagulation therapy and placement of IVC filter on 08/20/2015.  Echocardiogram was repeated on 08/23/2015 which showed persistent elevated pulmonary hypertension. The mass over the right tricuspid valve is no longer visible She  was hospitalized on 08/18/2015 to 08/24/2015. She was discharged home on Eliquis.  Evista was  discontinued. 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 On 09/17/2017, she started taking hydroxyurea 500 mg daily  REVIEW OF SYSTEMS:   Constitutional: Denies fevers, chills or abnormal weight loss Eyes: Denies blurriness of vision Ears, nose, mouth, throat, and face: Denies mucositis or sore throat Respiratory: Denies cough, dyspnea or wheezes Cardiovascular: Denies palpitation, chest discomfort or lower extremity swelling Gastrointestinal:  Denies nausea, heartburn or change in bowel habits Skin: Denies abnormal skin rashes Lymphatics: Denies new lymphadenopathy or easy bruising Neurological:Denies numbness, tingling or new weaknesses Behavioral/Psych: Mood is stable, no new changes  All other systems were reviewed with the patient and are negative.  I have reviewed the past medical history, past surgical history, social history and family history with the patient and they are unchanged from previous note.  ALLERGIES:  is allergic to evista [raloxifene] and fosamax [alendronate sodium].  MEDICATIONS:  Current Outpatient Medications  Medication Sig Dispense Refill  . apixaban (ELIQUIS) 2.5 MG TABS tablet Take 1 tablet (2.5 mg total) by mouth 2 (two) times daily. 90 tablet 11  . calcium carbonate (OS-CAL) 600 MG TABS tablet Take 600 mg by mouth daily.    . Cholecalciferol (VITAMIN D PO) Take 5,000 Units by mouth 3 (three) times daily.     . furosemide (LASIX) 40 MG tablet TAKE 1 TABLET (40 MG TOTAL) BY MOUTH DAILY. 90 tablet 1  . hydroxyurea (HYDREA) 500 MG capsule Take 1 capsule (500 mg total) by mouth daily. May take with food to minimize  GI side effects. 30 capsule 1  . MAGNESIUM PO Take 1 tablet by mouth 2 (two) times daily.     . metoprolol succinate (TOPROL-XL) 100 MG 24 hr tablet TAKE 1 TABLET BY MOUTH EVERY DAY TAKE WITH OR IMMEDIATELY AFTER A MEAL 90 tablet 1   No current facility-administered medications for this visit.     PHYSICAL EXAMINATION: ECOG PERFORMANCE STATUS: 0 - Asymptomatic  Vitals:   10/15/17 1159  BP: 140/88  Pulse: (!) 58  Resp: 18  Temp: 98.4 F (36.9 C)  SpO2: 97%   Filed Weights   10/15/17 1159  Weight: 169 lb 8 oz (76.9 kg)    GENERAL:alert, no distress and comfortable NEURO: alert & oriented x 3 with fluent speech, no focal motor/sensory deficits  LABORATORY DATA:  I have reviewed the data as listed    Component Value Date/Time   NA 141 09/25/2017 1102   K 4.4 09/25/2017 1102   CL 106 09/25/2017 1102   CO2 28 09/25/2017 1102   GLUCOSE 91 09/25/2017 1102   BUN 28 (H) 09/25/2017 1102   CREATININE 1.18 (H) 09/25/2017 1102   CREATININE 0.92 06/06/2017 1059   CALCIUM 9.5 09/25/2017 1102   PROT 7.2 09/25/2017 1102   ALBUMIN 3.5 09/25/2017 1102   AST 20 09/25/2017 1102   ALT 16 09/25/2017 1102   ALKPHOS 75 09/25/2017 1102   BILITOT 0.8 09/25/2017 1102   GFRNONAA 43 (L) 09/25/2017 1102   GFRNONAA 60 06/06/2017 1059   GFRAA 50 (L) 09/25/2017 1102   GFRAA 70 06/06/2017 1059    No results found for: SPEP, UPEP  Lab Results  Component Value Date   WBC 8.4 10/15/2017   NEUTROABS 4.0 10/15/2017   HGB 14.8 10/15/2017   HCT 46.3 10/15/2017   MCV 90.6 10/15/2017  PLT 480 (H) 10/15/2017      Chemistry      Component Value Date/Time   NA 141 09/25/2017 1102   K 4.4 09/25/2017 1102   CL 106 09/25/2017 1102   CO2 28 09/25/2017 1102   BUN 28 (H) 09/25/2017 1102   CREATININE 1.18 (H) 09/25/2017 1102   CREATININE 0.92 06/06/2017 1059      Component Value Date/Time   CALCIUM 9.5 09/25/2017 1102   ALKPHOS 75 09/25/2017 1102   AST 20 09/25/2017 1102   ALT 16 09/25/2017  1102   BILITOT 0.8 09/25/2017 1102       All questions were answered. The patient knows to call the clinic with any problems, questions or concerns. No barriers to learning was detected.  I spent 10 minutes counseling the patient face to face. The total time spent in the appointment was 15 minutes and more than 50% was on counseling and review of test results  [name], MD 10/15/2017 12:59 PM

## 2017-10-15 NOTE — Assessment & Plan Note (Signed)
So far, she tolerated hydroxyurea well She has normalization of hemoglobin and hematocrit Platelet counts are improving I plan to see her back again in 2 month for further review of her blood test

## 2017-10-18 ENCOUNTER — Encounter: Payer: Self-pay | Admitting: Internal Medicine

## 2017-10-22 NOTE — Telephone Encounter (Signed)
I spoke with Kimberly Watson and she said she has been told by Dr Alvy Bimler its okay to hold her Eliquis 2 days prior to procedure. Please confirm. Thank you.

## 2017-10-23 NOTE — Telephone Encounter (Signed)
Yes, hold Eliquid for 2 days before procedure

## 2017-10-23 NOTE — Telephone Encounter (Signed)
Thank you for your time

## 2017-11-01 ENCOUNTER — Encounter: Payer: Self-pay | Admitting: Internal Medicine

## 2017-11-01 ENCOUNTER — Other Ambulatory Visit: Payer: Self-pay

## 2017-11-01 ENCOUNTER — Ambulatory Visit (AMBULATORY_SURGERY_CENTER): Payer: Medicare Other | Admitting: Internal Medicine

## 2017-11-01 VITALS — BP 111/78 | HR 61 | Resp 18 | Ht 62.5 in | Wt 170.0 lb

## 2017-11-01 DIAGNOSIS — R1310 Dysphagia, unspecified: Secondary | ICD-10-CM

## 2017-11-01 DIAGNOSIS — Z1211 Encounter for screening for malignant neoplasm of colon: Secondary | ICD-10-CM | POA: Diagnosis not present

## 2017-11-01 DIAGNOSIS — Z1212 Encounter for screening for malignant neoplasm of rectum: Secondary | ICD-10-CM | POA: Diagnosis not present

## 2017-11-01 DIAGNOSIS — K317 Polyp of stomach and duodenum: Secondary | ICD-10-CM | POA: Diagnosis not present

## 2017-11-01 MED ORDER — SODIUM CHLORIDE 0.9 % IV SOLN: 500.0000 mL | Freq: Once | INTRAVENOUS | Status: DC

## 2017-11-01 NOTE — Progress Notes (Signed)
Called to room to assist during endoscopic procedure.  Patient ID and intended procedure confirmed with present staff. Received instructions for my participation in the procedure from the performing physician.  

## 2017-11-01 NOTE — Patient Instructions (Addendum)
I found a benign-appearing polyp in the stomach and took biopsies to see what it is - not suspicious at all.  I dilated the esophagus to help you swallow better.  No polyps or cancer in the colon but you do have diverticulosis - thickened muscle rings and pouches in the colon wall. Please read the handout about this condition. No need for routine repeat colonoscopy.  I appreciate the opportunity to care for you. Gatha Mayer, MD, [facility]  Diverticulosis handout given to patient.  Resume Eliquis at prior dose tomorrow.  Dilation diet handout given to patient.  YOU HAD AN ENDOSCOPIC PROCEDURE TODAY AT [facility]:   Refer to the procedure report that was given to you for any specific questions about what was found during the examination.  If the procedure report does not answer your questions, please call your gastroenterologist to clarify.  If you requested that your care partner not be given the details of your procedure findings, then the procedure report has been included in a sealed envelope for you to review at your convenience later.  YOU SHOULD EXPECT: Some feelings of bloating in the abdomen. Passage of more gas than usual.  Walking can help get rid of the air that was put into your GI tract during the procedure and reduce the bloating. If you had a lower endoscopy (such as a colonoscopy or flexible sigmoidoscopy) you may notice spotting of blood in your stool or on the toilet paper. If you underwent a bowel prep for your procedure, you may not have a normal bowel movement for a few days.  Please Note:  You might notice some irritation and congestion in your nose or some drainage.  This is from the oxygen used during your procedure.  There is no need for concern and it should clear up in a day or so.  SYMPTOMS TO REPORT IMMEDIATELY:   Following lower endoscopy (colonoscopy or flexible sigmoidoscopy):  Excessive amounts of blood in the stool  Significant  tenderness or worsening of abdominal pains  Swelling of the abdomen that is new, acute  Fever of 100F or higher   Following upper endoscopy (EGD)  Vomiting of blood or coffee ground material  New chest pain or pain under the shoulder blades  Painful or persistently difficult swallowing  New shortness of breath  Fever of 100F or higher  Black, tarry-looking stools  For urgent or emergent issues, a gastroenterologist can be reached at any hour by calling [phone].   DIET:  We do recommend a small meal at first, but then you may proceed to your regular diet.  Drink plenty of fluids but you should avoid alcoholic beverages for 24 hours.  ACTIVITY:  You should plan to take it easy for the rest of today and you should NOT DRIVE or use heavy machinery until tomorrow (because of the sedation medicines used during the test).    FOLLOW UP: Our staff will call the number listed on your records the next business day following your procedure to check on you and address any questions or concerns that you may have regarding the information given to you following your procedure. If we do not reach you, we will leave a message.  However, if you are feeling well and you are not experiencing any problems, there is no need to return our call.  We will assume that you have returned to your regular daily activities without incident.  If any biopsies were taken you  will be contacted by phone or by letter within the next 1-3 weeks.  Please call us at [phone] if you have not heard about the biopsies in 3 weeks.    SIGNATURES/CONFIDENTIALITY: You and/or your care partner have signed paperwork which will be entered into your electronic medical record.  These signatures attest to the fact that that the information above on your After Visit Summary has been reviewed and is understood.  Full responsibility of the confidentiality of this discharge information lies with you and/or your care-partner.

## 2017-11-01 NOTE — Op Note (Signed)
[facility] Patient Name: Kimberly Watson Procedure Date: 11/01/2017 3:37 PM MRN: [id number] Endoscopist: Gatha Mayer , MD Age: 77 Referring MD:  Date of Birth: 1940-11-06 Gender: Female Account #: [id number] Procedure:                Upper GI endoscopy Indications:              Dysphagia Medicines:                Propofol per Anesthesia, Monitored Anesthesia Care Procedure:                Pre-Anesthesia Assessment:                           - Prior to the procedure, a History and Physical                            was performed, and patient medications and                            allergies were reviewed. The patient's tolerance of                            previous anesthesia was also reviewed. The risks                            and benefits of the procedure and the sedation                            options and risks were discussed with the patient.                            All questions were answered, and informed consent                            was obtained. Prior Anticoagulants: The patient                            last took Eliquis (apixaban) 2 days prior to the                            procedure. ASA Grade Assessment: II - A patient                            with mild systemic disease. After reviewing the                            risks and benefits, the patient was deemed in                            satisfactory condition to undergo the procedure.                           After obtaining informed consent, the endoscope was  passed under direct vision. Throughout the                            procedure, the patient's blood pressure, pulse, and                            oxygen saturations were monitored continuously. The                            Endoscope was introduced through the mouth, and                            advanced to the second part of duodenum. The upper                            GI endoscopy was  accomplished without difficulty.                            The patient tolerated the procedure well. Scope In: Scope Out: Findings:                 No endoscopic abnormality was evident in the                            esophagus to explain the patient's complaint of                            dysphagia. It was decided, however, to proceed with                            dilation of the entire esophagus. The scope was                            withdrawn. Dilation was performed with a Maloney                            dilator with mild resistance at 68 Fr.                           A single 7 mm semi-sessile polyp with no bleeding                            and no stigmata of recent bleeding was found in the                            gastric body. This was biopsied with a cold forceps                            for histology. Verification of patient                            identification for the specimen was done. Estimated  blood loss was minimal.                           The exam was otherwise without abnormality.                           The cardia and gastric fundus were normal on                            retroflexion. Complications:            No immediate complications. Estimated Blood Loss:     Estimated blood loss was minimal. Impression:               - No endoscopic esophageal abnormality to explain                            patient's dysphagia. Esophagus dilated. Dilated.                           - A single gastric polyp. Biopsied.                           - The examination was otherwise normal. Recommendation:           - Patient has a contact number available for                            emergencies. The signs and symptoms of potential                            delayed complications were discussed with the                            patient. Return to normal activities tomorrow.                            Written discharge  instructions were provided to the                            patient.                           - Clear liquids x 1 hour then soft foods rest of                            day. Start prior diet tomorrow.                           - Continue present medications.                           - Await pathology results.                           - Resume Eliquis (apixaban) at prior dose tomorrow. Gatha Mayer, MD 11/01/2017 4:36:21 PM This report has been signed  electronically.

## 2017-11-01 NOTE — Op Note (Signed)
[facility] Patient Name: Kimberly Watson Procedure Date: 11/01/2017 3:37 PM MRN: [id number] Endoscopist: Gatha Mayer , MD Age: 77 Referring MD:  Date of Birth: Nov 21, 1940 Gender: Female Account #: [id number] Procedure:                Colonoscopy Indications:              Screening for colorectal malignant neoplasm Medicines:                Monitored Anesthesia Care Procedure:                Pre-Anesthesia Assessment:                           - Prior to the procedure, a History and Physical                            was performed, and patient medications and                            allergies were reviewed. The patient's tolerance of                            previous anesthesia was also reviewed. The risks                            and benefits of the procedure and the sedation                            options and risks were discussed with the patient.                            All questions were answered, and informed consent                            was obtained. Prior Anticoagulants: The patient                            last took Eliquis (apixaban) 2 days prior to the                            procedure. ASA Grade Assessment: III - A patient                            with severe systemic disease. After reviewing the                            risks and benefits, the patient was deemed in                            satisfactory condition to undergo the procedure.                           After obtaining informed consent, the colonoscope  was passed under direct vision. Throughout the                            procedure, the patient's blood pressure, pulse, and                            oxygen saturations were monitored continuously. The                            Colonoscope was introduced through the anus and                            advanced to the the cecum, identified by                            appendiceal orifice and  ileocecal valve. The                            colonoscopy was performed without difficulty. The                            patient tolerated the procedure well. The quality                            of the bowel preparation was excellent. The                            ileocecal valve, appendiceal orifice, and rectum                            were photographed. The bowel preparation used was                            Miralax. Scope In: 4:04:30 PM Scope Out: 4:16:41 PM Scope Withdrawal Time: 0 hours 9 minutes 7 seconds  Total Procedure Duration: 0 hours 12 minutes 11 seconds  Findings:                 The perianal and digital rectal examinations were                            normal.                           Multiple diverticula were found in the sigmoid                            colon.                           The exam was otherwise without abnormality on                            direct and retroflexion views. Complications:            No immediate complications. Estimated Blood Loss:     Estimated  blood loss: none. Impression:               - Diverticulosis in the sigmoid colon.                           - The examination was otherwise normal on direct                            and retroflexion views.                           - No specimens collected. Recommendation:           - Patient has a contact number available for                            emergencies. The signs and symptoms of potential                            delayed complications were discussed with the                            patient. Return to normal activities tomorrow.                            Written discharge instructions were provided to the                            patient.                           - Clear liquids x 1 hour then soft foods rest of                            day. Start prior diet tomorrow.                           - Continue present medications.                           -  Resume Eliquis (apixaban) at prior dose tomorrow. Gatha Mayer, MD 11/01/2017 4:38:27 PM This report has been signed electronically.

## 2017-11-01 NOTE — Progress Notes (Signed)
To PACU, VSS. Report to [email or web address] 

## 2017-11-02 ENCOUNTER — Telehealth: Payer: Self-pay

## 2017-11-02 NOTE — Telephone Encounter (Signed)
  Follow up Call-  Call back number 11/01/2017  Post procedure Call Back phone  # [phone]  Permission to leave phone message Yes  Some recent data might be hidden     Patient questions:  Do you have a fever, pain , or abdominal swelling? No. Pain Score  0 *  Have you tolerated food without any problems? Yes.    Have you been able to return to your normal activities? Yes.    Do you have any questions about your discharge instructions: Diet   No. Medications  No. Follow up visit  No.  Do you have questions or concerns about your Care? No.  Actions: * If pain score is 4 or above: No action needed, pain <4.

## 2017-11-08 ENCOUNTER — Encounter: Payer: Self-pay | Admitting: Internal Medicine

## 2017-11-08 ENCOUNTER — Other Ambulatory Visit: Payer: Self-pay

## 2017-11-08 MED ORDER — HYDROXYUREA 500 MG PO CAPS: 500.0000 mg | ORAL_CAPSULE | Freq: Every day | ORAL | 1 refills | Status: DC

## 2017-11-08 NOTE — Progress Notes (Signed)
Benign polypoid gastric mucosa No recall

## 2017-11-14 ENCOUNTER — Ambulatory Visit: Payer: Medicare Other | Admitting: Internal Medicine

## 2017-11-14 VITALS — BP 132/84 | HR 72 | Temp 97.2°F | Resp 16 | Ht 62.5 in | Wt 169.0 lb

## 2017-11-14 DIAGNOSIS — I10 Essential (primary) hypertension: Secondary | ICD-10-CM | POA: Diagnosis not present

## 2017-11-14 DIAGNOSIS — E782 Mixed hyperlipidemia: Secondary | ICD-10-CM | POA: Diagnosis not present

## 2017-11-14 DIAGNOSIS — R7309 Other abnormal glucose: Secondary | ICD-10-CM

## 2017-11-14 DIAGNOSIS — Z79899 Other long term (current) drug therapy: Secondary | ICD-10-CM

## 2017-11-14 DIAGNOSIS — E559 Vitamin D deficiency, unspecified: Secondary | ICD-10-CM

## 2017-11-14 NOTE — Progress Notes (Signed)
This very nice 77 y.o.female presents for 3 month follow up with HTN, HLD, Pre-Diabetes and Vitamin D Deficiency. In Jan 2017, patient experienced a DVT/ Saddle Embolus PE felt consequent of Evista (d/c'd) and she was started on Eliquis. Since then, patient is dx'd w/ a myeloproliferative disorder and has been started on Hydrea by Dr Alvy Bimler.      Patient is treated for HTN & BP has been controlled at home. Today's BP is at goal - 132/84. Patient has had no complaints of any cardiac type chest pain, palpitations, dyspnea / orthopnea / PND, dizziness, claudication, or dependent edema.     Hyperlipidemia is controlled with diet. Last Lipids were at goal: Lab Results  Component Value Date   CHOL 172 05/02/2017   HDL 60 05/02/2017   LDLCALC 90 05/02/2017   TRIG 121 05/02/2017   CHOLHDL 2.9 05/02/2017      Also, the patient has history of PreDiabetes/Insulin Resistance  and has had no symptoms of reactive hypoglycemia, diabetic polys, paresthesias or visual blurring.  Last A1c was Normal & at goal Lab Results  Component Value Date   HGBA1C 5.4 12/22/2016      Further, the patient also has history of Vitamin D Deficiency and supplements vitamin D sporadically.  Last vitamin D was still low: Lab Results  Component Value Date   VD25OH 46 12/22/2016   Current Outpatient Medications on File Prior to Visit  Medication Sig  . apixaban (ELIQUIS) 2.5 MG TABS tablet Take 1 tablet (2.5 mg total) by mouth 2 (two) times daily.  . calcium carbonate (OS-CAL) 600 MG TABS tablet Take 600 mg by mouth daily.  . Cholecalciferol (VITAMIN D PO) Take 5,000 Units by mouth 3 (three) times daily.   . furosemide (LASIX) 40 MG tablet TAKE 1 TABLET (40 MG TOTAL) BY MOUTH DAILY.  . hydroxyurea (HYDREA) 500 MG capsule Take 1 capsule (500 mg total) by mouth daily. May take with food to minimize GI side effects.  Marland Kitchen MAGNESIUM PO Take 1 tablet by mouth 2 (two) times daily.   . metoprolol succinate (TOPROL-XL) 100 MG  24 hr tablet TAKE 1 TABLET BY MOUTH EVERY DAY TAKE WITH OR IMMEDIATELY AFTER A MEAL   Allergies  Allergen Reactions  . Evista [Raloxifene] Other (See Comments)    Blood clots - DVT and Pulmonary Embolism   . Fosamax [Alendronate Sodium]     esophagitis   PMHx:   Past Medical History:  Diagnosis Date  . Cataract   . Clotting disorder ([place])   . DVT of lower extremity (deep venous thrombosis) ([place])    left, resolved  . Elevated hemoglobin A1c   . GERD (gastroesophageal reflux disease)   . H/O left knee surgery   . Hypertension   . JAK2 gene mutation   . Leiomyoma    adnexal Leiomyoma removal in 1/11  . 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 Nephrolithiasis   . Osteoporosis   . PE (pulmonary embolism) 08/2015   Immunization History  Administered Date(s) Administered  . DT 03/30/2015  . Influenza, High Dose Seasonal PF 05/02/2017  . Pneumococcal Conjugate-13 03/25/2014  . Zoster 12/27/2005   Past Surgical History:  Procedure Laterality Date  . CHOLECYSTECTOMY    . cyst removed off spine    . IVC FILTER PLACEMENT ([facility] HX)  08/2015  . JOINT REPLACEMENT Left total knee  . TONSILLECTOMY    . UTERINE FIBROID SURGERY    - Recent  Colonoscopy 10/29/2017 - Negative - Dr Carlean Purl  FHx:    Reviewed / unchanged  SHx:    Reviewed / unchanged  Systems Review:  Constitutional: Denies fever, chills, wt changes, headaches, insomnia, fatigue, night sweats, change in appetite. Eyes: Denies redness, blurred vision, diplopia, discharge, itchy, watery eyes.  ENT: Denies discharge, congestion, post nasal drip, epistaxis, sore throat, earache, hearing loss, dental pain, tinnitus, vertigo, sinus pain, snoring.  CV: Denies chest pain, palpitations, irregular heartbeat, syncope, dyspnea, diaphoresis, orthopnea, PND, claudication or edema. Respiratory: denies cough, dyspnea, DOE, pleurisy, hoarseness, laryngitis, wheezing.  Gastrointestinal: Denies dysphagia, odynophagia, heartburn,  reflux, water brash, abdominal pain or cramps, nausea, vomiting, bloating, diarrhea, constipation, hematemesis, melena, hematochezia  or hemorrhoids. Genitourinary: Denies dysuria, frequency, urgency, nocturia, hesitancy, discharge, hematuria or flank pain. Musculoskeletal: Denies arthralgias, myalgias, stiffness, jt. swelling, pain, limping or strain/sprain.  Skin: Denies pruritus, rash, hives, warts, acne, eczema or change in skin lesion(s). Neuro: No weakness, tremor, incoordination, spasms, paresthesia or pain. Psychiatric: Denies confusion, memory loss or sensory loss. Endo: Denies change in weight, skin or hair change.  Heme/Lymph: No excessive bleeding, bruising or enlarged lymph nodes.  Physical Exam  BP 132/84   Pulse 72   Temp (!) 97.2 F (36.2 C)   Resp 16   Ht 5' 2.5" (1.588 m)   Wt 169 lb (76.7 kg)   BMI 30.42 kg/m   Appears  well nourished, well groomed  and in no distress.  Eyes: PERRLA, EOMs, conjunctiva no swelling or erythema. Sinuses: No frontal/maxillary tenderness ENT/Mouth: EAC's clear, TM's nl w/o erythema, bulging. Nares clear w/o erythema, swelling, exudates. Oropharynx clear without erythema or exudates. Oral hygiene is good. Tongue normal, non obstructing. Hearing intact.  Neck: Supple. Thyroid not palpable. Car 2+/2+ without bruits, nodes or JVD. Chest: Respirations nl with BS clear & equal w/o rales, rhonchi, wheezing or stridor.  Cor: Heart sounds normal w/ regular rate and rhythm without sig. murmurs, gallops, clicks or rubs. Peripheral pulses normal and equal  without edema.  Abdomen: Soft & bowel sounds normal. Non-tender w/o guarding, rebound, hernias, masses or organomegaly.  Lymphatics: Unremarkable.  Musculoskeletal: Full ROM all peripheral extremities, joint stability, 5/5 strength and normal gait.  Skin: Warm, dry without exposed rashes, lesions or ecchymosis apparent.  Neuro: Cranial nerves intact, reflexes equal bilaterally. Sensory-motor  testing grossly intact. Tendon reflexes grossly intact.  Pysch: Alert & oriented x 3.  Insight and judgement nl & appropriate. No ideations.  Assessment and Plan:  1. Essential hypertension  - Continue medication, monitor blood pressure at home.  - Continue DASH diet.  Reminder to go to the ER if any CP,  SOB, nausea, dizziness, severe HA, changes vision/speech.  - Magnesium - COMPLETE METABOLIC PANEL WITH GFR  2. Hyperlipidemia, mixed  - Continue diet/meds, exercise,& lifestyle modifications.  - Continue monitor periodic cholesterol/liver & renal functions   - Lipid panel - COMPLETE METABOLIC PANEL WITH GFR  3. Abnormal glucose  - Continue diet, exercise, lifestyle modifications.  - Monitor appropriate labs.  - Hemoglobin A1c - Insulin, random  4. Vitamin D deficiency  - Continue supplementation.  - VITAMIN D 25 Hydroxy  5. Medication management  - Magnesium - Lipid panel - Hemoglobin A1c - Insulin, random - VITAMIN D 25 Hydroxy - COMPLETE METABOLIC PANEL WITH GFR           Discussed  regular exercise, BP monitoring, weight control to achieve/maintain BMI less than 25 and discussed med and SE's. Recommended labs to assess and monitor  clinical status with further disposition pending results of labs. Over 30 minutes of exam, counseling, chart review was performed.

## 2017-11-14 NOTE — Patient Instructions (Signed)

## 2017-11-15 LAB — COMPLETE METABOLIC PANEL WITH GFR
AG Ratio: 1.4 (calc) (ref 1.0–2.5)
ALT: 14 U/L (ref 6–29)
AST: 21 U/L (ref 10–35)
Albumin: 4.1 g/dL (ref 3.6–5.1)
Alkaline phosphatase (APISO): 78 U/L (ref 33–130)
BUN: 23 mg/dL (ref 7–25)
CO2: 29 mmol/L (ref 20–32)
Calcium: 9.6 mg/dL (ref 8.6–10.4)
Chloride: 102 mmol/L (ref 98–110)
Creat: 0.9 mg/dL (ref 0.60–0.93)
GFR, Est African American: 71 mL/min/{1.73_m2} (ref >=60)
GFR, Est Non African American: 62 mL/min/{1.73_m2} (ref >=60)
Globulin: 3 g/dL (calc) (ref 1.9–3.7)
Glucose, Bld: 83 mg/dL (ref 65–99)
Potassium: 4.4 mmol/L (ref 3.5–5.3)
Sodium: 139 mmol/L (ref 135–146)
Total Bilirubin: 0.6 mg/dL (ref 0.2–1.2)
Total Protein: 7.1 g/dL (ref 6.1–8.1)

## 2017-11-15 LAB — VITAMIN D 25 HYDROXY (VIT D DEFICIENCY, FRACTURES): Vit D, 25-Hydroxy: 94 ng/mL (ref 30–100)

## 2017-11-15 LAB — HEMOGLOBIN A1C
Hgb A1c MFr Bld: 5.4 % of total Hgb (ref <5.7)
Mean Plasma Glucose: 108 (calc)
eAG (mmol/L): 6 (calc)

## 2017-11-15 LAB — INSULIN, RANDOM: Insulin: 21.7 u[IU]/mL — ABNORMAL HIGH (ref 2.0–19.6)

## 2017-11-15 LAB — LIPID PANEL
Cholesterol: 149 mg/dL (ref <200)
HDL: 56 mg/dL (ref >50)
LDL Cholesterol (Calc): 72 mg/dL (calc)
Non-HDL Cholesterol (Calc): 93 mg/dL (calc) (ref <130)
Total CHOL/HDL Ratio: 2.7 (calc) (ref <5.0)
Triglycerides: 126 mg/dL (ref <150)

## 2017-11-15 LAB — MAGNESIUM: Magnesium: 2 mg/dL (ref 1.5–2.5)

## 2017-11-17 ENCOUNTER — Encounter: Payer: Self-pay | Admitting: Internal Medicine

## 2017-12-04 ENCOUNTER — Other Ambulatory Visit: Payer: Self-pay | Admitting: Hematology and Oncology

## 2017-12-04 ENCOUNTER — Other Ambulatory Visit: Payer: Self-pay | Admitting: Internal Medicine

## 2017-12-10 ENCOUNTER — Encounter: Payer: Self-pay | Admitting: Hematology and Oncology

## 2017-12-10 ENCOUNTER — Inpatient Hospital Stay: Payer: Medicare Other | Attending: Hematology and Oncology | Admitting: Hematology and Oncology

## 2017-12-10 ENCOUNTER — Inpatient Hospital Stay: Payer: Medicare Other

## 2017-12-10 ENCOUNTER — Telehealth: Payer: Self-pay | Admitting: Hematology and Oncology

## 2017-12-10 DIAGNOSIS — Z7901 Long term (current) use of anticoagulants: Secondary | ICD-10-CM

## 2017-12-10 DIAGNOSIS — D471 Chronic myeloproliferative disease: Secondary | ICD-10-CM

## 2017-12-10 DIAGNOSIS — M25561 Pain in right knee: Secondary | ICD-10-CM

## 2017-12-10 DIAGNOSIS — Z803 Family history of malignant neoplasm of breast: Secondary | ICD-10-CM

## 2017-12-10 DIAGNOSIS — G8929 Other chronic pain: Secondary | ICD-10-CM | POA: Diagnosis not present

## 2017-12-10 DIAGNOSIS — C946 Myelodysplastic disease, not classified: Secondary | ICD-10-CM

## 2017-12-10 DIAGNOSIS — R531 Weakness: Secondary | ICD-10-CM

## 2017-12-10 DIAGNOSIS — M810 Age-related osteoporosis without current pathological fracture: Secondary | ICD-10-CM

## 2017-12-10 DIAGNOSIS — Z86718 Personal history of other venous thrombosis and embolism: Secondary | ICD-10-CM | POA: Diagnosis not present

## 2017-12-10 DIAGNOSIS — I82402 Acute embolism and thrombosis of unspecified deep veins of left lower extremity: Secondary | ICD-10-CM

## 2017-12-10 DIAGNOSIS — Z79899 Other long term (current) drug therapy: Secondary | ICD-10-CM | POA: Diagnosis not present

## 2017-12-10 LAB — CBC WITH DIFFERENTIAL/PLATELET
Basophils Absolute: 0.1 10*3/uL (ref 0.0–0.1)
Basophils Relative: 1 %
Eosinophils Absolute: 0.2 10*3/uL (ref 0.0–0.5)
Eosinophils Relative: 3 %
HCT: 45.6 % (ref 34.8–46.6)
Hemoglobin: 15 g/dL (ref 11.6–15.9)
Lymphocytes Relative: 32 %
Lymphs Abs: 2.2 10*3/uL (ref 0.9–3.3)
MCH: 30.6 pg (ref 25.1–34.0)
MCHC: 32.8 g/dL (ref 31.5–36.0)
MCV: 93.3 fL (ref 79.5–101.0)
Monocytes Absolute: 0.6 10*3/uL (ref 0.1–0.9)
Monocytes Relative: 8 %
Neutro Abs: 3.7 10*3/uL (ref 1.5–6.5)
Neutrophils Relative %: 56 %
Platelets: 319 10*3/uL (ref 145–400)
RBC: 4.89 MIL/uL (ref 3.70–5.45)
RDW: 19.6 % — ABNORMAL HIGH (ref 11.2–14.5)
WBC: 6.8 10*3/uL (ref 3.9–10.3)

## 2017-12-10 MED ORDER — HYDROXYUREA 500 MG PO CAPS: 500.0000 mg | ORAL_CAPSULE | Freq: Every day | ORAL | 3 refills | Status: DC

## 2017-12-10 NOTE — Assessment & Plan Note (Signed)
The patient complained of chronic right knee pain and is contemplating surgery There is no contraindication for her to proceed from my standpoint but we will need to discuss perioperative anticoagulation management and medication adjustment of hydroxyurea in the future 

## 2017-12-10 NOTE — Assessment & Plan Note (Signed)
She tolerated hydroxyurea very well She has normalization of her CBC after 3 months I will see her back in 3 months with history, physical examination and blood work

## 2017-12-10 NOTE — Progress Notes (Signed)
[place] OFFICE PROGRESS NOTE  Patient Care Team: Unk Pinto, MD as PCP - General (Internal Medicine) Monna Fam, MD as Consulting Physician (Ophthalmology) Gatha Mayer, MD as Consulting Physician (Gastroenterology) Kathie Rhodes, MD as Consulting Physician (Urology) Meisinger, Sherren Mocha, MD as Consulting Physician (Obstetrics and Gynecology)  ASSESSMENT & PLAN:  Myeloproliferative disorder [facility]) Kimberly Watson tolerated hydroxyurea very well Kimberly Watson has normalization of her CBC after 3 months I will see her back in 3 months with history, physical examination and blood work   DVT (deep venous thrombosis) ([place]) 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 So far, Kimberly Watson has no bleeding complications  Chronic pain of right knee The patient complained of chronic right knee pain and is contemplating surgery There is no contraindication for her to proceed from my standpoint but we will need to discuss perioperative anticoagulation management and medication adjustment of hydroxyurea in the future   No orders of the defined types were placed in this encounter.   INTERVAL HISTORY: Please see below for problem oriented charting. Kimberly Watson returns for further follow-up Kimberly Watson tolerated treatment well No recent bleeding complications No recent infection.  SUMMARY OF ONCOLOGIC HISTORY:  Kimberly Watson was referred here after recent diagnosis of severe left lower extremity DVT with bilateral PE and cor pulmonale. I reviewed her records extensively and collaborated the history with the patient. The patient has been taking Evista for over 25 years due to strong family history of breast cancer and for osteoporosis management. In January 2017, Kimberly Watson had viral gastroenteritis with severe diarrhea and dehydration. The patient has been taking Lasix chronically for mild bilateral lower extremity  edema, left greater than the right. Kimberly Watson has chronic left lower extremity swelling since her left knee replacement surgery in 2001. Kimberly Watson recalled having profuse diarrhea nonstop for 24 hours leading up to the blood clot event. Of note, prior to the diagnosis, Kimberly Watson was not taking aspirin consistently  Prior to that Kimberly Watson denies recent history of trauma, long distance travel, recent surgery, smoking or prolonged immobilization. Kimberly Watson had no prior history or diagnosis of cancer. Her age appropriate screening programs are up-to-date. Kimberly Watson had prior surgeries before and never had perioperative thromboembolic events. The patient had been exposed to birth control pills and never had thrombotic events. The patient had been pregnant before and denies history of peripartum thromboembolic event or history of recurrent miscarriages. There is no family history of blood clots or miscarriages.  On 08/18/2015, Kimberly Watson presented to the emergency department due to symptoms of severe weakness, chest pressure, shortness of breath and near syncopal episode. Kimberly Watson had extensive evaluation in the emergency room. D-dimer was elevated at greater than 20.  CT angiogram dated 08/19/2015 show massive saddle pulmonary embolus with evidence of severe right heart strain.  Left lower extremity venous Doppler on 08/19/2015 showed findings consistent with acute deep vein thrombosis involving the left femoral vein, left popliteal vein, left posterior tibial vein and left peroneal vein.  Echocardiogram on 08/19/2015 show preserved ejection fraction but with right ventricular dilatation, moderate tricuspid valvular regurgitation, oscillating density on TV(thought to be blood clot in transit) and moderate to severe increased right systolic pressure/evidence of severe pulmonary hypertension  The patient was admitted with IV heparin anticoagulation therapy and placement of IVC filter on 08/20/2015.  Echocardiogram was repeated on 08/23/2015 which  showed persistent elevated pulmonary hypertension. The mass over the right tricuspid valve is no longer  visible Kimberly Watson was hospitalized on 08/18/2015 to 08/24/2015. Kimberly Watson was discharged home on Eliquis.  Evista was discontinued. 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 On 09/17/2017, Kimberly Watson started taking hydroxyurea 500 mg daily  REVIEW OF SYSTEMS:   Constitutional: Denies fevers, chills or abnormal weight loss Eyes: Denies blurriness of vision Ears, nose, mouth, throat, and face: Denies mucositis or sore throat Respiratory: Denies cough, dyspnea or wheezes Cardiovascular: Denies palpitation, chest discomfort or lower extremity swelling Gastrointestinal:  Denies nausea, heartburn or change in bowel habits Skin: Denies abnormal skin rashes Lymphatics: Denies new lymphadenopathy or easy bruising Neurological:Denies numbness, tingling or new weaknesses Behavioral/Psych: Mood is stable, no new changes  All other systems were reviewed with the patient and are negative.  I have reviewed the past medical history, past surgical history, social history and family history with the patient and they are unchanged from previous note.  ALLERGIES:  is allergic to evista [raloxifene] and fosamax [alendronate sodium].  MEDICATIONS:  Current Outpatient Medications  Medication Sig Dispense Refill  . apixaban (ELIQUIS) 2.5 MG TABS tablet Take 1 tablet (2.5 mg total) by mouth 2 (two) times daily. 90 tablet 11  . calcium carbonate (OS-CAL) 600 MG TABS tablet Take 600 mg by mouth daily.    . Cholecalciferol (VITAMIN D PO) Take 5,000 Units by mouth 3 (three) times daily.     . furosemide (LASIX) 40 MG tablet TAKE 1 TABLET BY MOUTH EVERY DAY 90 tablet 1  .  hydroxyurea (HYDREA) 500 MG capsule Take 1 capsule (500 mg total) by mouth daily. May take with food to minimize GI side effects. 90 capsule 3  . MAGNESIUM PO Take 1 tablet by mouth 2 (two) times daily.     . metoprolol succinate (TOPROL-XL) 100 MG 24 hr tablet TAKE 1 TABLET BY MOUTH EVERY DAY TAKE WITH OR IMMEDIATELY AFTER A MEAL 90 tablet 1   Current Facility-Administered Medications  Medication Dose Route Frequency Provider Last Rate Last Dose  . 0.9 %  sodium chloride infusion  500 mL Intravenous Once Gatha Mayer, MD        PHYSICAL EXAMINATION: ECOG PERFORMANCE STATUS: 0 - Asymptomatic  Vitals:   12/10/17 1204  BP: (!) 141/87  Pulse: 67  Resp: 18  Temp: 98.4 F (36.9 C)  SpO2: 96%   Filed Weights   12/10/17 1204  Weight: 168 lb 9.6 oz (76.5 kg)    GENERAL:alert, no distress and comfortable NEURO: alert & oriented x 3 with fluent speech, no focal motor/sensory deficits  LABORATORY DATA:  I have reviewed the data as listed    Component Value Date/Time   NA 139 11/14/2017 1144   K 4.4 11/14/2017 1144   CL 102 11/14/2017 1144   CO2 29 11/14/2017 1144   GLUCOSE 83 11/14/2017 1144   BUN 23 11/14/2017 1144   CREATININE 0.90 11/14/2017 1144   CALCIUM 9.6 11/14/2017 1144   PROT 7.1 11/14/2017 1144   ALBUMIN 3.5 09/25/2017 1102   AST 21 11/14/2017 1144   ALT 14 11/14/2017 1144   ALKPHOS 75 09/25/2017 1102   BILITOT 0.6 11/14/2017 1144   GFRNONAA 62 11/14/2017 1144   GFRAA 71 11/14/2017 1144    No results found for: SPEP, UPEP  Lab Results  Component Value Date   WBC 6.8 12/10/2017  NEUTROABS 3.7 12/10/2017   HGB 15.0 12/10/2017   HCT 45.6 12/10/2017   MCV 93.3 12/10/2017   PLT 319 12/10/2017      Chemistry      Component Value Date/Time   NA 139 11/14/2017 1144   K 4.4 11/14/2017 1144   CL 102 11/14/2017 1144   CO2 29 11/14/2017 1144   BUN 23 11/14/2017 1144   CREATININE 0.90 11/14/2017 1144      Component Value Date/Time   CALCIUM 9.6  11/14/2017 1144   ALKPHOS 75 09/25/2017 1102   AST 21 11/14/2017 1144   ALT 14 11/14/2017 1144   BILITOT 0.6 11/14/2017 1144       All questions were answered. The patient knows to call the clinic with any problems, questions or concerns. No barriers to learning was detected.  I spent 10 minutes counseling the patient face to face. The total time spent in the appointment was 15 minutes and more than 50% was on counseling and review of test results  [name], MD 12/10/2017 12:39 PM

## 2017-12-10 NOTE — Telephone Encounter (Signed)
Gave pt avs and calendar with appts per 5/13 los.  °

## 2017-12-10 NOTE — Assessment & Plan Note (Signed)
She had history of recurrent thrombosis She is doing well on chronic anticoagulation therapy I recommend she continue the same and indefinitely. So far, she has no bleeding complications 

## 2017-12-31 ENCOUNTER — Other Ambulatory Visit: Payer: Self-pay | Admitting: Internal Medicine

## 2017-12-31 DIAGNOSIS — Z298 Encounter for other specified prophylactic measures: Secondary | ICD-10-CM

## 2017-12-31 MED ORDER — AMOXICILLIN 500 MG PO CAPS: ORAL_CAPSULE | ORAL | 1 refills | Status: DC

## 2018-02-12 NOTE — Progress Notes (Signed)
FOLLOW UP  Assessment and Plan:   Pulmonary hypertension No shortness of breath, increase exercise, may want repeat echo after off eliquis or if any symptoms.  Continue to monitor  Hypertension Mildly elevated today; labile and can run down in 110s - she admits to increased salt intake, she will work on [employer] and lifestyle, defer increasing medications at this time Monitor blood pressure at home; patient to call if consistently greater than 130/80 Continue DASH diet.   Reminder to go to the ER if any CP, SOB, nausea, dizziness, severe HA, changes vision/speech, left arm numbness and tingling and jaw pain.  Cholesterol Currently at goal without medications, has been well controlled Continue low cholesterol diet and exercise.  Defer lipid panel.   Other abnormal glucose Recent A1Cs at goal Discussed diet/exercise, weight management  Defer A1C; check CMP  Obesity with co morbidities Long discussion about weight loss, diet, and exercise Recommended diet heavy in fruits and veggies and low in animal meats, cheeses, and dairy products, appropriate calorie intake Discussed ideal weight for height and initial weight goal (165 lb) Patient will work on going back to the gym starting tomorrow, follow DASH diet, monitor weight, and increase water to glasses of water to 64 fluid ounces of water, avoid eating when bored Will follow up in 3 months  Vitamin D Def At goal at last visit; continue supplementation to maintain goal of 70-100 Defer Vit D level  Continue diet and meds as discussed. Further disposition pending results of labs. Discussed med's effects and SE's.   Over 30 minutes of exam, counseling, chart review, and critical decision making was performed.   Future Appointments  Date Time Provider [place]  03/18/2018 11:45 AM [facility] LAB 1 [facility] None  03/18/2018 12:00 PM [name], MD [facility] None  05/09/2018 10:00 AM Vicie Mutters, PA-C [facility] None     ----------------------------------------------------------------------------------------------------------------------  HPI 77 y.o. female  presents for 3 month follow up on hypertension, cholesterol, glucose management, obesity and vitamin D deficiency. Patient had a DVT/saddle embolus PE though to be consequent of evista and was started on elequis. Since then, the patient has been diagnosed with a myeloproliferative disorder and has been started on hydrea by Dr. Alvy Bimler.   BMI is Body mass index is 30.6 kg/m., she has not been working on diet and exercise. She plans to restart going to the gym. She reports increased appetite with hydrea.  Wt Readings from Last 3 Encounters:  02/13/18 170 lb (77.1 kg)  12/10/17 168 lb 9.6 oz (76.5 kg)  11/14/17 169 lb (76.7 kg)   She has not been checking BP at home, today their BP is BP: (!) 146/84 She admits to eating a lot of pickles and sodium this summer.   She does not workout. She denies chest pain, shortness of breath, dizziness.   She is not on cholesterol medication and denies myalgias. Her cholesterol is at goal. The cholesterol last visit was:   Lab Results  Component Value Date   CHOL 149 11/14/2017   HDL 56 11/14/2017   LDLCALC 72 11/14/2017   TRIG 126 11/14/2017   CHOLHDL 2.7 11/14/2017    She has not been working on diet and exercise for glucose management, and denies foot ulcerations, increased appetite, nausea, paresthesia of the feet, polydipsia, polyuria, visual disturbances, vomiting and weight loss. Last A1C in the office was:  Lab Results  Component Value Date   HGBA1C 5.4 11/14/2017   Patient is on Vitamin D supplement.  Lab Results  Component Value Date   VD25OH 94 11/14/2017        Current Medications:  Current Outpatient Medications on File Prior to Visit  Medication Sig  . amoxicillin (AMOXIL) 500 MG capsule Take 4 capsules 2 hours before Dental Procedure  . apixaban (ELIQUIS) 2.5 MG TABS tablet Take 1  tablet (2.5 mg total) by mouth 2 (two) times daily.  . calcium carbonate (OS-CAL) 600 MG TABS tablet Take 600 mg by mouth daily.  . Cholecalciferol (VITAMIN D PO) Take 5,000 Units by mouth 3 (three) times daily.   . furosemide (LASIX) 40 MG tablet TAKE 1 TABLET BY MOUTH EVERY DAY  . hydroxyurea (HYDREA) 500 MG capsule Take 1 capsule (500 mg total) by mouth daily. May take with food to minimize GI side effects.  Marland Kitchen MAGNESIUM PO Take 1 tablet by mouth 2 (two) times daily.   . metoprolol succinate (TOPROL-XL) 100 MG 24 hr tablet TAKE 1 TABLET BY MOUTH EVERY DAY TAKE WITH OR IMMEDIATELY AFTER A MEAL   No current facility-administered medications on file prior to visit.      Allergies:  Allergies  Allergen Reactions  . Evista [Raloxifene] Other (See Comments)    Blood clots - DVT and Pulmonary Embolism   . Fosamax [Alendronate Sodium]     esophagitis     Medical History:  Past Medical History:  Diagnosis Date  . Cataract   . Clotting disorder ([place])   . DVT of lower extremity (deep venous thrombosis) ([place])    left, resolved  . Elevated hemoglobin A1c   . GERD (gastroesophageal reflux disease)   . H/O left knee surgery   . Hypertension   . JAK2 gene mutation   . Leiomyoma    adnexal Leiomyoma removal in 1/11  . 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 Nephrolithiasis   . Osteoporosis   . PE (pulmonary embolism) 08/2015   Family history- Reviewed and unchanged Social history- Reviewed and unchanged   Review of Systems:  Review of Systems  Constitutional: Negative for malaise/fatigue and weight loss.  HENT: Negative for hearing loss and tinnitus.   Eyes: Negative for blurred vision and double vision.  Respiratory: Negative for cough, shortness of breath and wheezing.   Cardiovascular: Negative for chest pain, palpitations, orthopnea, claudication and leg swelling.  Gastrointestinal: Negative for abdominal pain, blood in stool, constipation, diarrhea, heartburn,  melena, nausea and vomiting.  Genitourinary: Negative.   Musculoskeletal: Negative for joint pain and myalgias.  Skin: Negative for rash.  Neurological: Negative for dizziness, tingling, sensory change, weakness and headaches.  Endo/Heme/Allergies: Negative for polydipsia.  Psychiatric/Behavioral: Negative.   All other systems reviewed and are negative.    Physical Exam: BP (!) 146/84   Pulse 64   Temp 97.6 F (36.4 C)   Ht 5' 2.5" (1.588 m)   Wt 170 lb (77.1 kg)   SpO2 98%   BMI 30.60 kg/m  Wt Readings from Last 3 Encounters:  02/13/18 170 lb (77.1 kg)  12/10/17 168 lb 9.6 oz (76.5 kg)  11/14/17 169 lb (76.7 kg)   General Appearance: Well nourished, in no apparent distress. Eyes: PERRLA, EOMs, conjunctiva no swelling or erythema Sinuses: No Frontal/maxillary tenderness ENT/Mouth: Ext aud canals clear, TMs without erythema, bulging. No erythema, swelling, or exudate on post pharynx.  Tonsils not swollen or erythematous. Hearing normal.  Neck: Supple, thyroid normal.  Respiratory: Respiratory effort normal, BS equal bilaterally without rales, rhonchi, wheezing or stridor.  Cardio: RRR  with no MRGs. Brisk peripheral pulses without edema.  Abdomen: Soft, + BS.  Non tender, no guarding, rebound, hernias, masses. Lymphatics: Non tender without lymphadenopathy.  Musculoskeletal: Full ROM, 5/5 strength, Normal gait Skin: Warm, dry without rashes, lesions, ecchymosis.  Neuro: Cranial nerves intact. No cerebellar symptoms.  Psych: Awake and oriented X 3, normal affect, Insight and Judgment appropriate.    Izora Ribas, NP 11:31 AM [facility]

## 2018-02-13 ENCOUNTER — Ambulatory Visit: Payer: Medicare Other | Admitting: Adult Health

## 2018-02-13 ENCOUNTER — Encounter: Payer: Self-pay | Admitting: Adult Health

## 2018-02-13 VITALS — BP 146/84 | HR 64 | Temp 97.6°F | Ht 62.5 in | Wt 170.0 lb

## 2018-02-13 DIAGNOSIS — E559 Vitamin D deficiency, unspecified: Secondary | ICD-10-CM | POA: Diagnosis not present

## 2018-02-13 DIAGNOSIS — I2729 Other secondary pulmonary hypertension: Secondary | ICD-10-CM

## 2018-02-13 DIAGNOSIS — E669 Obesity, unspecified: Secondary | ICD-10-CM | POA: Diagnosis not present

## 2018-02-13 DIAGNOSIS — I10 Essential (primary) hypertension: Secondary | ICD-10-CM | POA: Diagnosis not present

## 2018-02-13 DIAGNOSIS — R7309 Other abnormal glucose: Secondary | ICD-10-CM | POA: Diagnosis not present

## 2018-02-13 DIAGNOSIS — I2699 Other pulmonary embolism without acute cor pulmonale: Secondary | ICD-10-CM

## 2018-02-13 DIAGNOSIS — E782 Mixed hyperlipidemia: Secondary | ICD-10-CM | POA: Diagnosis not present

## 2018-02-13 DIAGNOSIS — D471 Chronic myeloproliferative disease: Secondary | ICD-10-CM

## 2018-02-13 DIAGNOSIS — Z79899 Other long term (current) drug therapy: Secondary | ICD-10-CM

## 2018-02-13 NOTE — Patient Instructions (Addendum)
Discussed initial weight goal (165 lb)  Patient will work on going back to the gym starting tomorrow, follow DASH diet, monitor weight, and increase water to glasses of water to 64 fluid ounces of water, avoid eating when bored/anxious   Aim for 7+ servings of fruits and vegetables daily  64+ fluid ounces of water or unsweet tea for healthy kidneys  Limit animal fats in diet for cholesterol and heart health - choose grass fed whenever available  Aim for low stress - take time to unwind and care for your mental health  Aim for 150 min of moderate intensity exercise weekly for heart health, and weights twice weekly for bone health  Aim for 7-9 hours of sleep daily     Are you an emotional eater? Do you eat more when you're feeling stressed? Do you eat when you're not hungry or when you're full? Do you eat to feel better (to calm and soothe yourself when you're sad, mad, bored, anxious, etc.)? Do you reward yourself with food? Do you regularly eat until you've stuffed yourself? Does food make you feel safe? Do you feel like food is a friend? Do you feel powerless or out of control around food?  If you answered yes to some of these questions than it is likely that you are an emotional eater. This is normally a learned behavior and can take time to first recognize the signs and second BREAK THE HABIT. But here is more information and tips to help.   The difference between emotional hunger and physical hunger Emotional hunger can be powerful, so it's easy to mistake it for physical hunger. But there are clues you can look for to help you tell physical and emotional hunger apart.  Emotional hunger comes on suddenly. It hits you in an instant and feels overwhelming and urgent. Physical hunger, on the other hand, comes on more gradually. The urge to eat doesn't feel as dire or demand instant satisfaction (unless you haven't eaten for a very long time).  Emotional hunger craves specific  comfort foods. When you're physically hungry, almost anything sounds good-including healthy stuff like vegetables. But emotional hunger craves junk food or sugary snacks that provide an instant rush. You feel like you need cheesecake or pizza, and nothing else will do.  Emotional hunger often leads to mindless eating. Before you know it, you've eaten a whole bag of chips or an entire pint of ice cream without really paying attention or fully enjoying it. When you're eating in response to physical hunger, you're typically more aware of what you're doing.  Emotional hunger isn't satisfied once you're full. You keep wanting more and more, often eating until you're uncomfortably stuffed. Physical hunger, on the other hand, doesn't need to be stuffed. You feel satisfied when your stomach is full.  Emotional hunger isn't located in the stomach. Rather than a growling belly or a pang in your stomach, you feel your hunger as a craving you can't get out of your head. You're focused on specific textures, tastes, and smells.  Emotional hunger often leads to regret, guilt, or shame. When you eat to satisfy physical hunger, you're unlikely to feel guilty or ashamed because you're simply giving your body what it needs. If you feel guilty after you eat, it's likely because you know deep down that you're not eating for nutritional reasons.  Identify your emotional eating triggers What situations, places, or feelings make you reach for the comfort of food? Most emotional eating is  linked to unpleasant feelings, but it can also be triggered by positive emotions, such as rewarding yourself for achieving a goal or celebrating a holiday or happy event. Common causes of emotional eating include:  Stuffing emotions - Eating can be a way to temporarily silence or "stuff down" uncomfortable emotions, including anger, fear, sadness, anxiety, loneliness, resentment, and shame. While you're numbing yourself with food, you can  avoid the difficult emotions you'd rather not feel.  Boredom or feelings of emptiness - Do you ever eat simply to give yourself something to do, to relieve boredom, or as a way to fill a void in your life? You feel unfulfilled and empty, and food is a way to occupy your mouth and your time. In the moment, it fills you up and distracts you from underlying feelings of purposelessness and dissatisfaction with your life.  Childhood habits - Think back to your childhood memories of food. Did your parents reward good behavior with ice cream, take you out for pizza when you got a good report card, or serve you sweets when you were feeling sad? These habits can often carry over into adulthood. Or your eating may be driven by nostalgia-for cherished memories of grilling burgers in the backyard with your dad or baking and eating cookies with your mom.  Social influences - Getting together with other people for a meal is a great way to relieve stress, but it can also lead to overeating. It's easy to overindulge simply because the food is there or because everyone else is eating. You may also overeat in social situations out of nervousness. Or perhaps your family or circle of friends encourages you to overeat, and it's easier to go along with the group.  Stress - Ever notice how stress makes you hungry? It's not just in your mind. When stress is chronic, as it so often is in our chaotic, fast-paced world, your body produces high levels of the stress hormone, cortisol. Cortisol triggers cravings for salty, sweet, and fried foods-foods that give you a burst of energy and pleasure. The more uncontrolled stress in your life, the more likely you are to turn to food for emotional relief.  Find other ways to feed your feelings If you don't know how to manage your emotions in a way that doesn't involve food, you won't be able to control your eating habits for very long. Diets so often fail because they offer logical  nutritional advice which only works if you have conscious control over your eating habits. It doesn't work when emotions hijack the process, demanding an immediate payoff with food.  In order to stop emotional eating, you have to find other ways to fulfill yourself emotionally. It's not enough to understand the cycle of emotional eating or even to understand your triggers, although that's a huge first step. You need alternatives to food that you can turn to for emotional fulfillment.  Alternatives to emotional eating If you're depressed or lonely, call someone who always makes you feel better, play with your dog or cat, or look at a favorite photo or cherished memento.  If you're anxious, expend your nervous energy by dancing to your favorite song, squeezing a stress ball, or taking a brisk walk.  If you're exhausted, treat yourself with a hot cup of tea, take a bath, light some scented candles, or wrap yourself in a warm blanket.  If you're bored, read a good book, watch a comedy show, explore the outdoors, or turn to an activity  you enjoy (woodworking, playing the guitar, shooting hoops, scrapbooking, etc.).  What is mindful eating? Mindful eating is a practice that develops your awareness of eating habits and allows you to pause between your triggers and your actions. Most emotional eaters feel powerless over their food cravings. When the urge to eat hits, you feel an almost unbearable tension that demands to be fed, right now. Because you've tried to resist in the past and failed, you believe that your willpower just isn't up to snuff. But the truth is that you have more power over your cravings than you think.  Take 5 before you give in to a craving Emotional eating tends to be automatic and virtually mindless. Before you even realize what you're doing, you've reached for a tub of ice cream and polished off half of it. But if you can take a moment to pause and reflect when you're hit with a  craving, you give yourself the opportunity to make a different decision.  Can you put off eating for five minutes? Or just start with one minute. Don't tell yourself you can't give in to the craving; remember, the forbidden is extremely tempting. Just tell yourself to wait.  While you're waiting, check in with yourself. How are you feeling? What's going on emotionally? Even if you end up eating, you'll have a better understanding of why you did it. This can help you set yourself up for a different response next time.  How to practice mindful eating Eating while you're also doing other things-such as watching TV, driving, or playing with your phone-can prevent you from fully enjoying your food. Since your mind is elsewhere, you may not feel satisfied or continue eating even though you're no longer hungry. Eating more mindfully can help focus your mind on your food and the pleasure of a meal and curb overeating.   Eat your meals in a calm place with no distractions, aside from any dining companions.  Try eating with your non-dominant hand or using chopsticks instead of a knife and fork. Eating in such a non-familiar way can slow down how fast you eat and ensure your mind stays focused on your food.  Allow yourself enough time not to have to rush your meal. Set a timer for 20 minutes and pace yourself so you spend at least that much time eating.  Take small bites and chew them well, taking time to notice the different flavors and textures of each mouthful.  Put your utensils down between bites. Take time to consider how you feel-hungry, satiated-before picking up your utensils again.  Try to stop eating before you are full.It takes time for the signal to reach your brain that you've had enough. Don't feel obligated to always clean your plate.  When you've finished your food, take a few moments to assess if you're really still hungry before opting for an extra serving or dessert.  Learn to accept  your feelings-even the bad ones  While it may seem that the core problem is that you're powerless over food, emotional eating actually stems from feeling powerless over your emotions. You don't feel capable of dealing with your feelings head on, so you avoid them with food.  Recommended reading  Mini Habits for weight loss  Healthy Eating: A guide to the new nutrition - [place] Report  10 Tips for Mindful Eating - How mindfulness can help you fully enjoy a meal and the experience of eating-with moderation and restraint. ([employer]  [facility] blog)  Weight Loss: Gain Control of Emotional Eating - Tips to regain control of your eating habits. [facility])  Why Stress Causes People to Overeat -Tips on controlling stress eating. ([place])  Mindful Eating Meditations -Free online mindfulness meditations. ([facility] [employer])

## 2018-02-14 LAB — COMPLETE METABOLIC PANEL WITH GFR
AG Ratio: 1.3 (calc) (ref 1.0–2.5)
ALT: 15 U/L (ref 6–29)
AST: 21 U/L (ref 10–35)
Albumin: 4.1 g/dL (ref 3.6–5.1)
Alkaline phosphatase (APISO): 82 U/L (ref 33–130)
BUN: 24 mg/dL (ref 7–25)
CO2: 27 mmol/L (ref 20–32)
Calcium: 10 mg/dL (ref 8.6–10.4)
Chloride: 102 mmol/L (ref 98–110)
Creat: 0.92 mg/dL (ref 0.60–0.93)
GFR, Est African American: 70 mL/min/{1.73_m2} (ref >=60)
GFR, Est Non African American: 60 mL/min/{1.73_m2} (ref >=60)
Globulin: 3.1 g/dL (calc) (ref 1.9–3.7)
Glucose, Bld: 87 mg/dL (ref 65–99)
Potassium: 4.5 mmol/L (ref 3.5–5.3)
Sodium: 138 mmol/L (ref 135–146)
Total Bilirubin: 0.8 mg/dL (ref 0.2–1.2)
Total Protein: 7.2 g/dL (ref 6.1–8.1)

## 2018-02-14 LAB — CBC WITH DIFFERENTIAL/PLATELET
Basophils Absolute: 72 cells/uL (ref 0–200)
Basophils Relative: 1.1 %
Eosinophils Absolute: 254 cells/uL (ref 15–500)
Eosinophils Relative: 3.9 %
HCT: 42.4 % (ref 35.0–45.0)
Hemoglobin: 14.5 g/dL (ref 11.7–15.5)
Lymphs Abs: 2386 cells/uL (ref 850–3900)
MCH: 31.2 pg (ref 27.0–33.0)
MCHC: 34.2 g/dL (ref 32.0–36.0)
MCV: 91.2 fL (ref 80.0–100.0)
MPV: 10.5 fL (ref 7.5–12.5)
Monocytes Relative: 9.3 %
Neutro Abs: 3185 cells/uL (ref 1500–7800)
Neutrophils Relative %: 49 %
Platelets: 325 10*3/uL (ref 140–400)
RBC: 4.65 10*6/uL (ref 3.80–5.10)
RDW: 13.8 % (ref 11.0–15.0)
Total Lymphocyte: 36.7 %
WBC mixed population: 605 cells/uL (ref 200–950)
WBC: 6.5 10*3/uL (ref 3.8–10.8)

## 2018-02-14 LAB — TSH: TSH: 1.97 mIU/L (ref 0.40–4.50)

## 2018-03-18 ENCOUNTER — Inpatient Hospital Stay: Payer: Medicare Other | Attending: Hematology and Oncology | Admitting: Hematology and Oncology

## 2018-03-18 ENCOUNTER — Inpatient Hospital Stay: Payer: Medicare Other

## 2018-03-18 ENCOUNTER — Telehealth: Payer: Self-pay | Admitting: Hematology and Oncology

## 2018-03-18 ENCOUNTER — Encounter: Payer: Self-pay | Admitting: Hematology and Oncology

## 2018-03-18 DIAGNOSIS — M25561 Pain in right knee: Secondary | ICD-10-CM | POA: Insufficient documentation

## 2018-03-18 DIAGNOSIS — M810 Age-related osteoporosis without current pathological fracture: Secondary | ICD-10-CM | POA: Diagnosis not present

## 2018-03-18 DIAGNOSIS — Z7901 Long term (current) use of anticoagulants: Secondary | ICD-10-CM | POA: Diagnosis not present

## 2018-03-18 DIAGNOSIS — Z86718 Personal history of other venous thrombosis and embolism: Secondary | ICD-10-CM

## 2018-03-18 DIAGNOSIS — Z803 Family history of malignant neoplasm of breast: Secondary | ICD-10-CM | POA: Diagnosis not present

## 2018-03-18 DIAGNOSIS — G8929 Other chronic pain: Secondary | ICD-10-CM | POA: Diagnosis not present

## 2018-03-18 DIAGNOSIS — I82402 Acute embolism and thrombosis of unspecified deep veins of left lower extremity: Secondary | ICD-10-CM

## 2018-03-18 DIAGNOSIS — I272 Pulmonary hypertension, unspecified: Secondary | ICD-10-CM | POA: Diagnosis not present

## 2018-03-18 DIAGNOSIS — C946 Myelodysplastic disease, not classified: Secondary | ICD-10-CM | POA: Diagnosis not present

## 2018-03-18 DIAGNOSIS — Z79899 Other long term (current) drug therapy: Secondary | ICD-10-CM | POA: Diagnosis not present

## 2018-03-18 DIAGNOSIS — D471 Chronic myeloproliferative disease: Secondary | ICD-10-CM

## 2018-03-18 DIAGNOSIS — Z86711 Personal history of pulmonary embolism: Secondary | ICD-10-CM | POA: Insufficient documentation

## 2018-03-18 LAB — CBC WITH DIFFERENTIAL/PLATELET
Basophils Absolute: 0.1 10*3/uL (ref 0.0–0.1)
Basophils Relative: 1 %
Eosinophils Absolute: 0.3 10*3/uL (ref 0.0–0.5)
Eosinophils Relative: 5 %
HCT: 45.5 % (ref 34.8–46.6)
Hemoglobin: 14.5 g/dL (ref 11.6–15.9)
Lymphocytes Relative: 33 %
Lymphs Abs: 2.2 10*3/uL (ref 0.9–3.3)
MCH: 31.8 pg (ref 25.1–34.0)
MCHC: 31.9 g/dL (ref 31.5–36.0)
MCV: 99.8 fL (ref 79.5–101.0)
Monocytes Absolute: 0.6 10*3/uL (ref 0.1–0.9)
Monocytes Relative: 9 %
Neutro Abs: 3.4 10*3/uL (ref 1.5–6.5)
Neutrophils Relative %: 52 %
Platelets: 292 10*3/uL (ref 145–400)
RBC: 4.56 MIL/uL (ref 3.70–5.45)
RDW: 14.3 % (ref 11.2–14.5)
WBC: 6.5 10*3/uL (ref 3.9–10.3)

## 2018-03-18 NOTE — Telephone Encounter (Signed)
Gave patient avs report and appointments for November.  °

## 2018-03-18 NOTE — Progress Notes (Signed)
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  She is warned to call me if she feels ill with infection, stress or planning to have surgery  DVT (deep venous thrombosis) ([place]) She had history of recurrent thrombosis She is doing well on chronic anticoagulation therapy I recommend she continue the same and indefinitely. So far, she has no bleeding complications  Chronic pain of right knee The patient complained of chronic right knee pain and is contemplating surgery There is no contraindication for her to proceed from my standpoint but we will need to discuss perioperative anticoagulation management and medication adjustment of hydroxyurea in the future   No orders of the defined types were placed in this encounter.   INTERVAL HISTORY: Please see below for problem oriented charting. She returns for further follow-up She is doing well and is compliant taking all her medications as directed She denies recent infection The patient denies any recent signs or symptoms of bleeding such as spontaneous epistaxis, hematuria or hematochezia.  SUMMARY OF ONCOLOGIC HISTORY:  Kimberly Watson was referred here after recent diagnosis of severe left lower extremity DVT with bilateral PE and cor pulmonale. I reviewed her records extensively and collaborated the history with the patient. The patient has been  taking Evista for over 25 years due to strong family history of breast cancer and for osteoporosis management. In January 2017, she had viral gastroenteritis with severe diarrhea and dehydration. The patient has been taking Lasix chronically for mild bilateral lower extremity edema, left greater than the right. 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 She recalled having profuse diarrhea nonstop for 24 hours leading up to the blood clot event. Of note, prior to the diagnosis, she was not taking aspirin consistently  Prior to that she denies recent history of trauma, long distance travel, recent surgery, smoking or prolonged immobilization. She had no prior history or diagnosis of cancer. Her age appropriate screening programs are up-to-date. She had prior surgeries before and never had perioperative thromboembolic events. The patient had been exposed to birth control pills and never had thrombotic events. The patient had been pregnant before and denies history of peripartum thromboembolic event or history of recurrent miscarriages. There is no family history of blood clots or miscarriages.  On 08/18/2015, she presented to the emergency department due to symptoms of severe weakness, chest pressure, shortness of breath and near syncopal episode. She had extensive evaluation in the emergency room. D-dimer was elevated at greater than 20.  CT angiogram dated 08/19/2015 show massive saddle pulmonary embolus with evidence of severe right heart strain.  Left lower extremity venous Doppler on 08/19/2015 showed findings consistent with acute deep vein thrombosis involving the left femoral vein, left popliteal vein, left posterior tibial vein and left peroneal vein.  Echocardiogram on 08/19/2015 show preserved ejection fraction but with right ventricular dilatation, moderate tricuspid valvular regurgitation, oscillating density on TV(thought to be blood  clot in transit) and moderate to  severe increased right systolic pressure/evidence of severe pulmonary hypertension  The patient was admitted with IV heparin anticoagulation therapy and placement of IVC filter on 08/20/2015.  Echocardiogram was repeated on 08/23/2015 which showed persistent elevated pulmonary hypertension. The mass over the right tricuspid valve is no longer visible She was hospitalized on 08/18/2015 to 08/24/2015. She was discharged home on Eliquis.  Evista was discontinued. 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 On 09/17/2017, she started taking hydroxyurea 500 mg daily  REVIEW OF SYSTEMS:   Constitutional: Denies fevers, chills or abnormal weight loss Eyes: Denies blurriness of vision Ears, nose, mouth, throat, and face: Denies mucositis or sore throat Respiratory: Denies cough, dyspnea or wheezes Cardiovascular: Denies palpitation, chest discomfort or lower extremity swelling Gastrointestinal:  Denies nausea, heartburn or change in bowel habits Skin: Denies abnormal skin rashes Lymphatics: Denies new lymphadenopathy or easy bruising Neurological:Denies numbness, tingling or new weaknesses Behavioral/Psych: Mood is stable, no new changes  All other systems were reviewed with the patient and are negative.  I have reviewed the past medical history, past surgical history, social history and family history with the patient and they are unchanged from previous note.  ALLERGIES:  is allergic to evista [raloxifene] and fosamax [alendronate sodium].  MEDICATIONS:  Current Outpatient Medications  Medication Sig Dispense Refill  . amoxicillin (AMOXIL) 500 MG capsule Take 4 capsules 2 hours before Dental Procedure 12  capsule 1  . apixaban (ELIQUIS) 2.5 MG TABS tablet Take 1 tablet (2.5 mg total) by mouth 2 (two) times daily. 90 tablet 11  . calcium carbonate (OS-CAL) 600 MG TABS tablet Take 600 mg by mouth daily.    . Cholecalciferol (VITAMIN D PO) Take 5,000 Units by mouth 3 (three) times daily.     . furosemide (LASIX) 40 MG tablet TAKE 1 TABLET BY MOUTH EVERY DAY 90 tablet 1  . hydroxyurea (HYDREA) 500 MG capsule Take 1 capsule (500 mg total) by mouth daily. May take with food to minimize GI side effects. 90 capsule 3  . MAGNESIUM PO Take 1 tablet by mouth 2 (two) times daily.     . metoprolol succinate (TOPROL-XL) 100 MG 24 hr tablet TAKE 1 TABLET BY MOUTH EVERY DAY TAKE WITH OR IMMEDIATELY AFTER A MEAL 90 tablet 1   No current facility-administered medications for this visit.     PHYSICAL EXAMINATION: ECOG PERFORMANCE STATUS: 0 - Asymptomatic  Vitals:   03/18/18 1227  BP: (!) 152/93  Pulse: 66  Resp: 18  Temp: 97.7 F (36.5 C)  SpO2: 96%   Filed Weights   03/18/18 1227  Weight: 171 lb 1.6 oz (77.6 kg)    GENERAL:alert, no distress and comfortable NEURO: alert & oriented x 3 with fluent speech, no focal motor/sensory deficits  LABORATORY DATA:  I have reviewed the data as listed    Component Value Date/Time   NA 138 02/13/2018 1153   K 4.5 02/13/2018 1153   CL 102 02/13/2018 1153   CO2 27 02/13/2018 1153   GLUCOSE 87 02/13/2018 1153   BUN 24 02/13/2018 1153   CREATININE 0.92 02/13/2018 1153   CALCIUM 10.0 02/13/2018 1153   PROT 7.2 02/13/2018 1153   ALBUMIN 3.5 09/25/2017 1102   AST 21 02/13/2018 1153   ALT 15 02/13/2018 1153   ALKPHOS 75 09/25/2017 1102  BILITOT 0.8 02/13/2018 1153   GFRNONAA 60 02/13/2018 1153   GFRAA 70 02/13/2018 1153    No results found for: SPEP, UPEP  Lab Results  Component Value Date   WBC 6.5 03/18/2018   NEUTROABS 3.4 03/18/2018   HGB 14.5 03/18/2018   HCT 45.5 03/18/2018   MCV 99.8 03/18/2018   PLT 292 03/18/2018      Chemistry       Component Value Date/Time   NA 138 02/13/2018 1153   K 4.5 02/13/2018 1153   CL 102 02/13/2018 1153   CO2 27 02/13/2018 1153   BUN 24 02/13/2018 1153   CREATININE 0.92 02/13/2018 1153      Component Value Date/Time   CALCIUM 10.0 02/13/2018 1153   ALKPHOS 75 09/25/2017 1102   AST 21 02/13/2018 1153   ALT 15 02/13/2018 1153   BILITOT 0.8 02/13/2018 1153      All questions were answered. The patient knows to call the clinic with any problems, questions or concerns. No barriers to learning was detected.  I spent 10 minutes counseling the patient face to face. The total time spent in the appointment was 15 minutes and more than 50% was on counseling and review of test results  [name], MD 03/18/2018 2:26 PM

## 2018-03-18 NOTE — Assessment & Plan Note (Signed)
She had history of recurrent thrombosis She is doing well on chronic anticoagulation therapy I recommend she continue the same and indefinitely. So far, she has no bleeding complications 

## 2018-03-18 NOTE — Assessment & Plan Note (Signed)
The patient complained of chronic right knee pain and is contemplating surgery There is no contraindication for her to proceed from my standpoint but we will need to discuss perioperative anticoagulation management and medication adjustment of hydroxyurea in the future

## 2018-03-18 NOTE — Assessment & Plan Note (Signed)
She tolerated hydroxyurea very well She has normalization of her CBC after 3 months I will see her back in 3 months with history, physical examination and blood work The plan will be to continue indefinitely.  She is warned to call me if she feels ill with infection, stress or planning to have surgery

## 2018-04-11 ENCOUNTER — Other Ambulatory Visit: Payer: Self-pay | Admitting: *Deleted

## 2018-04-11 MED ORDER — METOPROLOL SUCCINATE ER 100 MG PO TB24: ORAL_TABLET | ORAL | 1 refills | Status: DC

## 2018-05-09 ENCOUNTER — Encounter: Payer: Self-pay | Admitting: Physician Assistant

## 2018-05-15 NOTE — Progress Notes (Signed)
Medicare wellness  Assessment:   Essential hypertension - continue medications, DASH diet, exercise and monitor at home. Call if greater than 130/80.  - CBC with Differential/Platelet - BASIC METABOLIC PANEL WITH GFR - Hepatic function panel - TSH - Urinalysis, Routine w reflex microscopic (not at [facility]) - Microalbumin / creatinine urine ratio - EKG 12-Lead  Mixed hyperlipidemia -continue medications, check lipids, decrease fatty foods, increase activity.  - Lipid panel  Prediabetes Discussed general issues about diabetes pathophysiology and management., Educational material distributed., Suggested low cholesterol diet., Encouraged aerobic exercise., Discussed foot care., Reminded to get yearly retinal exam. 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     Future Appointments  Date Time Provider [place]  06/17/2018 11:30 AM [facility] 2 [facility] None  06/17/2018 12:00 PM [name], MD [facility] None     Plan:   During the course of the visit the patient was educated and counseled about appropriate screening and preventive services including:  6 month  Influenza vaccine- declines until next year  Screening electrocardiogram  Screening mammography- yearly  Bone densitometry screening  Colorectal  cancer screening  Diabetes screening- today  Glaucoma screening- yearly  Nutrition counseling   Advanced directives:    Subjective:   Kimberly Watson is a 77 y.o. female who presents for Medicare Annual Wellness Visit and 3 month follow up on hypertension, prediabetes, hyperlipidemia, vitamin D def.   She states she has history of diverticulitis, has been having left flank pain, fecal incontinence, diarrhea.   Her blood pressure has been controlled at home, today their BP is BP: 120/68  She has joined a gym, recently has not been going but will start again but she has been traveling a lot and wants to restart it. She denies chest pain, shortness of breath, dizziness. She has not had any shortness of breath, had pulmonary hypertension on echo due to PE, not symptoms at this time.   Her cholesterol is diet controlled.  Her cholesterol is controlled. The cholesterol last visit was:   Lab Results  Component Value Date   CHOL 149 11/14/2017   HDL 56 11/14/2017   LDLCALC 72 11/14/2017   TRIG 126 11/14/2017   CHOLHDL 2.7 11/14/2017   She has been working on diet and exercise for prediabetes, and has no polyuria or polydipsia, no chest pain, dyspnea or TIAs, no numbness, tingling or pain in extremities. Last A1C in the office was:  Lab Results  Component Value Date   HGBA1C 5.4 11/14/2017   Patient is on Vitamin D supplement.   She was on evista for osteoporosis, she was taken off due to submassive bilateral saddle PE with right heart strain. She has seen Dr. Alvy Bimler, still on elliquis.  She has history of nephrolithiasis, follows with Dr. Karsten Ro.    BMI is Body mass index is 30.17 kg/m., she is working on diet and exercise. Wt Readings  from Last 3 Encounters:  05/20/18 167 lb 9.6 oz (76 kg)  03/18/18 171 lb 1.6 oz (77.6 kg)  02/13/18 170 lb (77.1 kg)    Names of Other Physician/Practitioners you currently use: 1. [facility]- here for  primary care 2. Dr.Hecker, eye doctor, last visit Glasses/contacts, 2019 3. Judeth Horn Dentist, last visit every 6 months Patient Care Team: Unk Pinto, MD as PCP - General (Internal Medicine) Monna Fam, MD as Consulting Physician (Ophthalmology) Gatha Mayer, MD as Consulting Physician (Gastroenterology) Kathie Rhodes, MD as Consulting Physician (Urology) Meisinger, Sherren Mocha, MD as Consulting Physician (Obstetrics and Gynecology)  Medication Review Current Outpatient Medications on File Prior to Visit  Medication Sig Dispense Refill  . apixaban (ELIQUIS) 2.5 MG TABS tablet Take 1 tablet (2.5 mg total) by mouth 2 (two) times daily. 90 tablet 11  . calcium carbonate (OS-CAL) 600 MG TABS tablet Take 600 mg by mouth daily.    . Cholecalciferol (VITAMIN D PO) Take 5,000 Units by mouth 3 (three) times daily.     . furosemide (LASIX) 40 MG tablet TAKE 1 TABLET BY MOUTH EVERY DAY 90 tablet 1  . hydroxyurea (HYDREA) 500 MG capsule Take 1 capsule (500 mg total) by mouth daily. May take with food to minimize GI side effects. 90 capsule 3  . MAGNESIUM PO Take 1 tablet by mouth 2 (two) times daily.     . metoprolol succinate (TOPROL-XL) 100 MG 24 hr tablet TAKE 1 TABLET BY MOUTH EVERY DAY TAKE WITH OR IMMEDIATELY AFTER A MEAL 90 tablet 1  . amoxicillin (AMOXIL) 500 MG capsule Take 4 capsules 2 hours before Dental Procedure (Patient not taking: Reported on 05/20/2018) 12 capsule 1   No current facility-administered medications on file prior to visit.     Current Problems (verified) Patient Active Problem List   Diagnosis Date Noted  . 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 Goals of care, counseling/discussion 09/13/2017  . 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 Hiatal hernia 08/09/2017  . Diverticulosis 08/09/2017  . Osteoarthritis of right knee 08/09/2017  . 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 DVT (deep venous thrombosis)  ([place])   . Osteoporosis 03/30/2015  . Medication management 03/30/2015  . Essential hypertension 06/18/2013  . Hyperlipidemia, mixed 06/18/2013  . Other abnormal glucose 06/18/2013  . Vitamin D deficiency 06/18/2013  . GERD (gastroesophageal reflux disease) 06/18/2013  . Nephrolithiasis 06/18/2013   Screening Tests Immunization History  Administered Date(s) Administered  . DT 03/30/2015  . Influenza, High Dose Seasonal PF 05/02/2017, 05/20/2018  . Pneumococcal Conjugate-13 03/25/2014  . Zoster 12/27/2005    Preventative care: Last colonoscopy: 10/2017 Last mammogram: 04/2017 Last pap smear/pelvic exam: remote declines another DEXA: 04/2017 Osteopenia- had (reclast 08,09,11) DUE Echo 08/2015  Prior vaccinations: TD or Tdap: 2016 Influenza: 2019 TODAY Prevnar 13: 2015 Pneumococcal: 2008  Shingles/Zostavax: 2007  Allergies Allergies  Allergen Reactions  . Evista [Raloxifene] Other (See Comments)    Blood clots - DVT and Pulmonary Embolism   . Fosamax [Alendronate Sodium]     esophagitis    SURGICAL HISTORY She  has a past surgical history that includes Joint replacement (Left, total knee); Uterine fibroid surgery; Cholecystectomy; IVC FILTER PLACEMENT ([facility] HX) (08/2015); Tonsillectomy; and cyst removed off spine. FAMILY HISTORY Her family history includes Breast cancer in her mother; Congestive Heart Failure in her mother; Diabetes in her mother; Hypertension in her mother; Osteoporosis in her father. SOCIAL HISTORY She  reports that she  has never smoked. She has never used smokeless tobacco. She reports that she does not drink alcohol or use drugs.  MEDICARE WELLNESS OBJECTIVES: Physical activity: Current Exercise Habits: Structured exercise class, Type of exercise: walking;strength training/weights, Time (Minutes): 20, Frequency (Times/Week): 3, Weekly Exercise (Minutes/Week): 60, Intensity: Mild Cardiac risk factors: Cardiac Risk Factors include: advanced age  (>10men, >34 women);dyslipidemia;hypertension;sedentary lifestyle;obesity (BMI >30kg/m2) Depression/mood screen:   Depression screen [facility] 2/9 05/20/2018  Decreased Interest 0  Down, Depressed, Hopeless 0  PHQ - 2 Score 0    ADLs:  In your present state of health, do you have any difficulty performing the following activities: 05/20/2018 11/17/2017  Hearing? N N  Vision? N N  Difficulty concentrating or making decisions? N N  Walking or climbing stairs? N N  Dressing or bathing? N N  Doing errands, shopping? N N  Some recent data might be hidden    Cognitive Testing  Alert? Yes  Normal Appearance?Yes  Oriented to person? Yes  Place? Yes   Time? Yes  Recall of three objects?  Yes  Can perform simple calculations? Yes  Displays appropriate judgment?Yes  Can read the correct time from a watch face?Yes  EOL planning: Does Patient Have a Medical Advance Directive?: Yes Type of Advance Directive: Healthcare Power of Attorney, Living will Copy of [place] in Chart?: No - copy requested  Review of Systems  Constitutional: Negative.   HENT: Negative for congestion, ear discharge, ear pain, hearing loss, nosebleeds, sore throat and tinnitus.   Eyes: Negative.   Respiratory: Negative for cough, hemoptysis, sputum production, shortness of breath, wheezing and stridor.   Cardiovascular: Negative for chest pain, palpitations, orthopnea, claudication, leg swelling and PND.  Gastrointestinal: Negative for abdominal pain, blood in stool, constipation, diarrhea, heartburn, melena, nausea and vomiting.  Genitourinary: Negative.   Musculoskeletal: Positive for joint pain (right knee). Negative for back pain, falls, myalgias and neck pain.  Skin: Negative.   Neurological: Negative.  Negative for headaches.  Psychiatric/Behavioral: Negative.      Objective:   Blood pressure 120/68, pulse 66, temperature 98.3 F (36.8 C), resp. rate 16, height 5' 2.5" (1.588 m), weight 167  lb 9.6 oz (76 kg), SpO2 95 %. Body mass index is 30.17 kg/m.  General appearance: alert, no distress, WD/WN,  female HEENT: normocephalic, sclerae anicteric, TMs pearly, nares patent, no discharge or erythema, pharynx normal Oral cavity: MMM, no lesions Neck: supple, no lymphadenopathy, no thyromegaly, no masses Heart: RRR, normal S1, S2, no murmurs Lungs: CTA bilaterally, no wheezes, rhonchi, or rales Abdomen: +bs, soft, non tender, non distended, no masses, no hepatomegaly, no splenomegaly Musculoskeletal: nontender, no swelling, no obvious deformity,  Extremities: no edema, no cyanosis, no clubbing Pulses: 2+ symmetric, upper and lower extremities, normal cap refill Neurological: alert, oriented x 3, CN2-12 intact, strength normal upper extremities and lower extremities, sensation normal throughout, DTRs 2+ throughout, no cerebellar signs, gait antalgic Psychiatric: normal affect, behavior normal, pleasant  Breast: nontender, no masses or lumps, no skin changes, no nipple discharge or inversion, no axillary lymphadenopathy Gyn: defer  Rectal: defer  Medicare Attestation I have personally reviewed: The patient's medical and social history Their use of alcohol, tobacco or illicit drugs Their current medications and supplements The patient's functional ability including ADLs,fall risks, home safety risks, cognitive, and hearing and visual impairment Diet and physical activities Evidence for depression or mood disorders  The patient's weight, height, BMI, and visual acuity have been recorded in the chart.  I have  made referrals, counseling, and provided education to the patient based on review of the above and I have provided the patient with a written personalized care plan for preventive services.     Vicie Mutters, PA-C   05/20/2018

## 2018-05-20 ENCOUNTER — Ambulatory Visit: Payer: Medicare Other | Admitting: Physician Assistant

## 2018-05-20 ENCOUNTER — Encounter: Payer: Self-pay | Admitting: Physician Assistant

## 2018-05-20 VITALS — BP 120/68 | HR 66 | Temp 98.3°F | Resp 16 | Ht 62.5 in | Wt 167.6 lb

## 2018-05-20 DIAGNOSIS — I2699 Other pulmonary embolism without acute cor pulmonale: Secondary | ICD-10-CM

## 2018-05-20 DIAGNOSIS — N200 Calculus of kidney: Secondary | ICD-10-CM

## 2018-05-20 DIAGNOSIS — R6889 Other general symptoms and signs: Secondary | ICD-10-CM

## 2018-05-20 DIAGNOSIS — Z23 Encounter for immunization: Secondary | ICD-10-CM

## 2018-05-20 DIAGNOSIS — I2729 Other secondary pulmonary hypertension: Secondary | ICD-10-CM

## 2018-05-20 DIAGNOSIS — R7309 Other abnormal glucose: Secondary | ICD-10-CM

## 2018-05-20 DIAGNOSIS — I82402 Acute embolism and thrombosis of unspecified deep veins of left lower extremity: Secondary | ICD-10-CM

## 2018-05-20 DIAGNOSIS — Z0001 Encounter for general adult medical examination with abnormal findings: Secondary | ICD-10-CM | POA: Diagnosis not present

## 2018-05-20 DIAGNOSIS — M1711 Unilateral primary osteoarthritis, right knee: Secondary | ICD-10-CM

## 2018-05-20 DIAGNOSIS — M810 Age-related osteoporosis without current pathological fracture: Secondary | ICD-10-CM

## 2018-05-20 DIAGNOSIS — Z0000 Encounter for general adult medical examination without abnormal findings: Secondary | ICD-10-CM

## 2018-05-20 DIAGNOSIS — D471 Chronic myeloproliferative disease: Secondary | ICD-10-CM | POA: Diagnosis not present

## 2018-05-20 DIAGNOSIS — K449 Diaphragmatic hernia without obstruction or gangrene: Secondary | ICD-10-CM

## 2018-05-20 DIAGNOSIS — Z79899 Other long term (current) drug therapy: Secondary | ICD-10-CM

## 2018-05-20 DIAGNOSIS — E559 Vitamin D deficiency, unspecified: Secondary | ICD-10-CM

## 2018-05-20 DIAGNOSIS — K219 Gastro-esophageal reflux disease without esophagitis: Secondary | ICD-10-CM

## 2018-05-20 DIAGNOSIS — E782 Mixed hyperlipidemia: Secondary | ICD-10-CM

## 2018-05-20 DIAGNOSIS — K5790 Diverticulosis of intestine, part unspecified, without perforation or abscess without bleeding: Secondary | ICD-10-CM

## 2018-05-20 DIAGNOSIS — Z7901 Long term (current) use of anticoagulants: Secondary | ICD-10-CM | POA: Diagnosis not present

## 2018-05-20 DIAGNOSIS — I10 Essential (primary) hypertension: Secondary | ICD-10-CM

## 2018-05-20 NOTE — Patient Instructions (Addendum)
[place]- check it out  Ways to prevent diarrhea with magnesium:  1) Don't take all your magnesium at the same time, have 2-3 smaller doses through out the day 2) Try taking your magnesium with high fiber meals.  3) If this does not help, take the magnesium on an empty stomach. Fiber for some people can bind the magnesium too well and prevent absorption in your gut.  4) Lastly try different types of magnesium. Most people are taking magnesium citrate, you can also try dimalate capsules which are slow release. You can also find magnesium lotions/sprays for the skin that bypass the gut. Another one that has good absorption is ReMag (pico-iconic magnesium formula), this has great cellular absorption so less of a laxative effect. You can find these type at health food stores or online.   Your ears and sinuses are connected by the eustachian tube. When your sinuses are inflamed, this can close off the tube and cause fluid to collect in your middle ear. This can then cause dizziness, popping, clicking, ringing, and echoing in your ears. This is often NOT an infection and does NOT require antibiotics, it is caused by inflammation so the treatments help the inflammation. This can take a long time to get better so please be patient.  Here are things you can do to help with this: - Try the Flonase or Nasonex. Remember to spray each nostril twice towards the outer part of your eye.  Do not sniff but instead pinch your nose and tilt your head back to help the medicine get into your sinuses.  The best time to do this is at bedtime.Stop if you get blurred vision or nose bleeds.  -While drinking fluids, pinch and hold nose close and swallow, to help open eustachian tubes to drain fluid behind ear drums. -Please pick one of the over the counter allergy medications below and take it once daily for allergies.  It will also help with fluid behind ear drums. Claritin or loratadine cheapest but likely the weakest  Zyrtec  or certizine at night because it can make you sleepy The strongest is allegra or fexafinadine  Cheapest at [employer], [employer], [employer] -can use decongestant over the counter, please do not use if you have high blood pressure or certain heart conditions.   if worsening HA, changes vision/speech, imbalance, weakness go to the ER   Can do citracel or benefiber or add on fiber supplement to prevent flare of diverticulitis If you do have a flare such as left lower quadrant pain, diarrhea avoid fiber, do bowel rest which involves liquid diet at first with broth and jello and then slowly advance your diet to bland foods such as potatoes, noodles, etc and then add back in fiber.  We can add on antibiotics if you need to.    Diverticulitis Diverticulitis is inflammation or infection of small pouches in your colon that form when you have a condition called diverticulosis. The pouches in your colon are called diverticula. Your colon, or large intestine, is where water is absorbed and stool is formed. Complications of diverticulitis can include:  Bleeding.  Severe infection.  Severe pain.  Perforation of your colon.  Obstruction of your colon.  What are the causes? Diverticulitis is caused by bacteria. Diverticulitis happens when stool becomes trapped in diverticula. This allows bacteria to grow in the diverticula, which can lead to inflammation and infection. What increases the risk? People with diverticulosis are at risk for diverticulitis. Eating a diet that does not  include enough fiber from fruits and vegetables may make diverticulitis more likely to develop. What are the signs or symptoms? Symptoms of diverticulitis may include:  Abdominal pain and tenderness. The pain is normally located on the left side of the abdomen, but may occur in other areas.  Fever and chills.  Bloating.  Cramping.  Nausea.  Vomiting.  Constipation.  Diarrhea.  Blood in your stool.  How is this  diagnosed? Your health care provider will ask you about your medical history and do a physical exam. You may need to have tests done because many medical conditions can cause the same symptoms as diverticulitis. Tests may include:  Blood tests.  Urine tests.  Imaging tests of the abdomen, including X-rays and CT scans.  When your condition is under control, your health care provider may recommend that you have a colonoscopy. A colonoscopy can show how severe your diverticula are and whether something else is causing your symptoms. How is this treated? Most cases of diverticulitis are mild and can be treated at home. Treatment may include:  Taking over-the-counter pain medicines.  Following a clear liquid diet.  Taking antibiotic medicines by mouth for 7-10 days.  More severe cases may be treated at a hospital. Treatment may include:  Not eating or drinking.  Taking prescription pain medicine.  Receiving antibiotic medicines through an IV tube.  Receiving fluids and nutrition through an IV tube.  Surgery.  Follow these instructions at home:  Follow your health care provider's instructions carefully.  Follow a full liquid diet or other diet as directed by your health care provider. After your symptoms improve, your health care provider may tell you to change your diet. He or she may recommend you eat a high-fiber diet. Fruits and vegetables are good sources of fiber. Fiber makes it easier to pass stool.  Take fiber supplements or probiotics as directed by your health care provider.  Only take medicines as directed by your health care provider.  Keep all your follow-up appointments. Contact a health care provider if:  Your pain does not improve.  You have a hard time eating food.  Your bowel movements do not return to normal. Get help right away if:  Your pain becomes worse.  Your symptoms do not get better.  Your symptoms suddenly get worse.  You have a  fever.  You have repeated vomiting.  You have bloody or black, tarry stools. This information is not intended to replace advice given to you by your health care provider. Make sure you discuss any questions you have with your health care provider. Document Released: 04/26/2005 Document Revised: 12/23/2015 Document Reviewed: 06/11/2013 Elsevier Interactive Patient Education  2017 [employer].

## 2018-05-21 LAB — CBC WITH DIFFERENTIAL/PLATELET
Basophils Absolute: 82 cells/uL (ref 0–200)
Basophils Relative: 1.3 %
Eosinophils Absolute: 202 cells/uL (ref 15–500)
Eosinophils Relative: 3.2 %
HCT: 45.6 % — ABNORMAL HIGH (ref 35.0–45.0)
Hemoglobin: 15.1 g/dL (ref 11.7–15.5)
Lymphs Abs: 2350 cells/uL (ref 850–3900)
MCH: 31.2 pg (ref 27.0–33.0)
MCHC: 33.1 g/dL (ref 32.0–36.0)
MCV: 94.2 fL (ref 80.0–100.0)
MPV: 10.8 fL (ref 7.5–12.5)
Monocytes Relative: 10.4 %
Neutro Abs: 3011 cells/uL (ref 1500–7800)
Neutrophils Relative %: 47.8 %
Platelets: 324 10*3/uL (ref 140–400)
RBC: 4.84 10*6/uL (ref 3.80–5.10)
RDW: 13.1 % (ref 11.0–15.0)
Total Lymphocyte: 37.3 %
WBC mixed population: 655 cells/uL (ref 200–950)
WBC: 6.3 10*3/uL (ref 3.8–10.8)

## 2018-05-21 LAB — COMPLETE METABOLIC PANEL WITH GFR
AG Ratio: 1.4 (calc) (ref 1.0–2.5)
ALT: 14 U/L (ref 6–29)
AST: 19 U/L (ref 10–35)
Albumin: 4.4 g/dL (ref 3.6–5.1)
Alkaline phosphatase (APISO): 83 U/L (ref 33–130)
BUN/Creatinine Ratio: 23 (calc) — ABNORMAL HIGH (ref 6–22)
BUN: 23 mg/dL (ref 7–25)
CO2: 29 mmol/L (ref 20–32)
Calcium: 10.4 mg/dL (ref 8.6–10.4)
Chloride: 102 mmol/L (ref 98–110)
Creat: 1 mg/dL — ABNORMAL HIGH (ref 0.60–0.93)
GFR, Est African American: 62 mL/min/{1.73_m2} (ref >=60)
GFR, Est Non African American: 54 mL/min/{1.73_m2} — ABNORMAL LOW (ref >=60)
Globulin: 3.2 g/dL (calc) (ref 1.9–3.7)
Glucose, Bld: 91 mg/dL (ref 65–99)
Potassium: 4.5 mmol/L (ref 3.5–5.3)
Sodium: 142 mmol/L (ref 135–146)
Total Bilirubin: 1.2 mg/dL (ref 0.2–1.2)
Total Protein: 7.6 g/dL (ref 6.1–8.1)

## 2018-05-21 LAB — LIPID PANEL
Cholesterol: 178 mg/dL (ref <200)
HDL: 60 mg/dL (ref >50)
LDL Cholesterol (Calc): 97 mg/dL (calc)
Non-HDL Cholesterol (Calc): 118 mg/dL (calc) (ref <130)
Total CHOL/HDL Ratio: 3 (calc) (ref <5.0)
Triglycerides: 111 mg/dL (ref <150)

## 2018-05-21 LAB — TSH: TSH: 1.23 mIU/L (ref 0.40–4.50)

## 2018-05-21 LAB — HM MAMMOGRAPHY

## 2018-05-21 LAB — MAGNESIUM: Magnesium: 2.1 mg/dL (ref 1.5–2.5)

## 2018-05-29 ENCOUNTER — Encounter: Payer: Self-pay | Admitting: Adult Health

## 2018-05-29 ENCOUNTER — Ambulatory Visit: Payer: Medicare Other | Admitting: Adult Health

## 2018-05-29 VITALS — BP 132/80 | HR 98 | Temp 97.2°F | Ht 62.5 in | Wt 171.6 lb

## 2018-05-29 DIAGNOSIS — M779 Enthesopathy, unspecified: Secondary | ICD-10-CM

## 2018-05-29 DIAGNOSIS — M1990 Unspecified osteoarthritis, unspecified site: Secondary | ICD-10-CM

## 2018-05-29 DIAGNOSIS — M778 Other enthesopathies, not elsewhere classified: Secondary | ICD-10-CM

## 2018-05-29 NOTE — Progress Notes (Signed)
Assessment and Plan:  Kimberly Watson was seen today for wrist pain.  Diagnoses and all orders for this visit:  Left hand tendonitis/arthritis flare Improving with rest, ice application. Recommended she start ibuprofen/aleve for the next few days, wear brace on wrist when having to use wrist but avoid piano playing  No indication for imaging at this time, but discussed should redness not improve or pain reoccur to call back and will pursue.   Further disposition pending results of labs. Discussed med's effects and SE's.   Over 15 minutes of exam, counseling, chart review, and critical decision making was performed.   Future Appointments  Date Time Provider [place]  06/17/2018 11:30 AM [facility] 2 [facility] None  06/17/2018 12:00 PM [name], MD [facility] None  11/26/2018 11:00 AM Unk Pinto, MD [facility] None  06/04/2019 11:15 AM Vicie Mutters, PA-C [facility] None    ------------------------------------------------------------------------------------------------------------------   HPI BP 132/80   Pulse 98   Temp (!) 97.2 F (36.2 C)   Ht 5' 2.5" (1.588 m)   Wt 171 lb 9.6 oz (77.8 kg)   SpO2 92%   BMI 30.89 kg/m   77 y.o.female right handed, hx of arthritis, presents for evaluation of left wrist pain x 2 days; she recently started playing the piano much more and has been hyperextending her fingers, has lateral wrist pain which was 10/10 Monday night, "joint ache" type pain with radiation up her arm and shoulder, but started applying ice yesterday and is doing much better, pain is 0/10 today. She does have some superficial redness without joint tenderness or pain with palpation, she admits she has been rubbing on the joint and attributes redness to this, denies noting heat, swelling to the joint, or fever/chills, numbness, weakness. She has not taken any ibuprofen/aleve/tylenol or other OTC agents.   She is established with Dr. Alvan Dame  Past Medical History:   Diagnosis Date  . Cataract   . Clotting disorder ([place])   . DVT of lower extremity (deep venous thrombosis) ([place])    left, resolved  . Elevated hemoglobin A1c   . GERD (gastroesophageal reflux disease)   . H/O left knee surgery   . Hypertension   . JAK2 gene mutation   . Leiomyoma    adnexal Leiomyoma removal in 1/11  . 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 Nephrolithiasis   . Osteoporosis   . PE (pulmonary embolism) 08/2015     Allergies  Allergen Reactions  . Evista [Raloxifene] Other (See Comments)    Blood clots - DVT and Pulmonary Embolism   . Fosamax [Alendronate Sodium]     esophagitis    Current Outpatient Medications on File Prior to Visit  Medication Sig  . amoxicillin (AMOXIL) 500 MG capsule Take 4 capsules 2 hours before Dental Procedure  . apixaban (ELIQUIS) 2.5 MG TABS tablet Take 1 tablet (2.5 mg total) by mouth 2 (two) times daily.  . calcium carbonate (OS-CAL) 600 MG TABS tablet Take 600 mg by mouth daily.  . Cholecalciferol (VITAMIN D PO) Take 5,000 Units by mouth 3 (three) times daily.   . furosemide (LASIX) 40 MG tablet TAKE 1 TABLET BY MOUTH EVERY DAY  . hydroxyurea (HYDREA) 500 MG capsule Take 1 capsule (500 mg total) by mouth daily. May take with food to minimize GI side effects.  Marland Kitchen MAGNESIUM PO Take 1 tablet by mouth 2 (two) times daily.   . metoprolol succinate (TOPROL-XL) 100 MG 24 hr tablet TAKE 1 TABLET BY  MOUTH EVERY DAY TAKE WITH OR IMMEDIATELY AFTER A MEAL   No current facility-administered medications on file prior to visit.     ROS: all negative except above.   Physical Exam:  BP 132/80   Pulse 98   Temp (!) 97.2 F (36.2 C)   Ht 5' 2.5" (1.588 m)   Wt 171 lb 9.6 oz (77.8 kg)   SpO2 92%   BMI 30.89 kg/m   General Appearance: Well nourished, in no apparent distress. Eyes: conjunctiva no swelling or erythema ENT/Mouth: Hearing normal.  Neck: Supple Respiratory: Respiratory effort normal Cardio: RRR with no MRGs.  Brisk peripheral pulses without edema.  Lymphatics: Non tender without lymphadenopathy.  Musculoskeletal: Full ROM, some mild generalized wrist pain with ROM, no notable effusion, crepitus, symmetrical strength, normal gait.  Mild generalized erythema overlying dorsal surface of hand without clear borders.  Skin: Warm, dry without rashes, lesions, ecchymosis.  Neuro: Sensation intact.  Psych: Awake and oriented X 3, normal affect, Insight and Judgment appropriate.     Kimberly Ribas, NP 11:12 AM [facility]

## 2018-05-29 NOTE — Patient Instructions (Signed)
Recommend taking ibuprofen or aleve, avoid rubbing on the wrist, apply ice for 20 min several times per day   Tendinitis Tendinitis is inflammation of a tendon. A tendon is a strong cord of tissue that connects muscle to bone. Tendinitis can affect any tendon, but it most commonly affects the shoulder tendon (rotator cuff), ankle tendon (Achilles tendon), elbow tendon (triceps tendon), or one of the tendons in the wrist. What are the causes? This condition may be caused by:  Overusing a tendon or muscle. This is common.  Age-related wear and tear.  Injury.  Inflammatory conditions, such as arthritis.  Certain medicines.  What increases the risk? This condition is more likely to develop in people who do activities that involve repetitive motions. What are the signs or symptoms? Symptoms of this condition may include:  Pain.  Tenderness.  Mild swelling.  How is this diagnosed? This condition is diagnosed with a physical exam. You may also have tests, such as:  Ultrasound. This uses sound waves to make an image of your affected area.  MRI.  How is this treated? This condition may be treated by resting, icing, applying pressure (compression), and raising (elevating) the area above the level of your heart. This is known as RICE therapy. Treatment may also include:  Medicines to help reduce inflammation or to help reduce pain.  Exercises or physical therapy to strengthen and stretch the tendon.  A brace or splint.  Surgery (rare).  Follow these instructions at home:  If you have a splint or brace:  Wear the splint or brace as told by your health care provider. Remove it only as told by your health care provider.  Loosen the splint or brace if your fingers or toes tingle, become numb, or turn cold and blue.  Do not take baths, swim, or use a hot tub until your health care provider approves. Ask your health care provider if you can take showers. You may only be allowed  to take sponge baths for bathing.  Do not let your splint or brace get wet if it is not waterproof. ? If your splint or brace is not waterproof, cover it with a watertight plastic bag when you take a bath or a shower.  Keep the splint or brace clean. Managing pain, stiffness, and swelling  If directed, apply ice to the affected area. ? Put ice in a plastic bag. ? Place a towel between your skin and the bag. ? Leave the ice on for 20 minutes, 2-3 times a day.  If directed, apply heat to the affected area as often as told by your health care provider. Use the heat source that your health care provider recommends, such as a moist heat pack or a heating pad. ? Place a towel between your skin and the heat source. ? Leave the heat on for 20-30 minutes. ? Remove the heat if your skin turns bright red. This is especially important if you are unable to feel pain, heat, or cold. You may have a greater risk of getting burned.  Move the fingers or toes of the affected limb often, if this applies. This can help to prevent stiffness and lessen swelling.  If directed, elevate the affected area above the level of your heart while you are sitting or lying down. Driving  Do not drive or operate heavy machinery while taking prescription pain medicine.  Ask your health care provider when it is safe to drive if you have a splint or brace  on any part of your arm or leg. Activity  Return to your normal activities as told by your health care provider. Ask your health care provider what activities are safe for you.  Rest the affected area as told by your health care provider.  Avoid using the affected area while you are experiencing symptoms of tendinitis.  Do exercises as told by your health care provider. General instructions  If you have a splint, do not put pressure on any part of the splint until it is fully hardened. This may take several hours.  Wear an elastic bandage or compression wrap only  as told by your health care provider.  Take over-the-counter and prescription medicines only as told by your health care provider.  Keep all follow-up visits as told by your health care provider. This is important. Contact a health care provider if:  Your symptoms do not improve.  You develop new, unexplained problems, such as numbness in your hands. This information is not intended to replace advice given to you by your health care provider. Make sure you discuss any questions you have with your health care provider. Document Released: 07/14/2000 Document Revised: 03/16/2016 Document Reviewed: 04/19/2015 Elsevier Interactive Patient Education  [employer].

## 2018-06-04 ENCOUNTER — Encounter: Payer: Self-pay | Admitting: *Deleted

## 2018-06-11 ENCOUNTER — Telehealth: Payer: Self-pay | Admitting: Hematology and Oncology

## 2018-06-11 NOTE — Telephone Encounter (Signed)
NG out of office 10/30 thru 12/2 - moved f/u to [facility]. Left message for patient re change. Schedule mailed.

## 2018-06-12 ENCOUNTER — Other Ambulatory Visit: Payer: Self-pay | Admitting: Internal Medicine

## 2018-06-17 ENCOUNTER — Other Ambulatory Visit: Payer: Medicare Other

## 2018-06-17 ENCOUNTER — Inpatient Hospital Stay: Payer: Medicare Other | Attending: Hematology and Oncology

## 2018-06-17 ENCOUNTER — Encounter: Payer: Self-pay | Admitting: Adult Health

## 2018-06-17 ENCOUNTER — Inpatient Hospital Stay: Payer: Medicare Other | Admitting: Adult Health

## 2018-06-17 ENCOUNTER — Telehealth: Payer: Self-pay | Admitting: Hematology and Oncology

## 2018-06-17 ENCOUNTER — Ambulatory Visit: Payer: Medicare Other | Admitting: Hematology and Oncology

## 2018-06-17 VITALS — BP 144/85 | HR 58 | Temp 98.1°F | Resp 19 | Ht 62.5 in | Wt 170.8 lb

## 2018-06-17 DIAGNOSIS — I272 Pulmonary hypertension, unspecified: Secondary | ICD-10-CM

## 2018-06-17 DIAGNOSIS — M1711 Unilateral primary osteoarthritis, right knee: Secondary | ICD-10-CM | POA: Insufficient documentation

## 2018-06-17 DIAGNOSIS — I10 Essential (primary) hypertension: Secondary | ICD-10-CM | POA: Insufficient documentation

## 2018-06-17 DIAGNOSIS — R7309 Other abnormal glucose: Secondary | ICD-10-CM | POA: Insufficient documentation

## 2018-06-17 DIAGNOSIS — C946 Myelodysplastic disease, not classified: Secondary | ICD-10-CM | POA: Insufficient documentation

## 2018-06-17 DIAGNOSIS — K449 Diaphragmatic hernia without obstruction or gangrene: Secondary | ICD-10-CM | POA: Insufficient documentation

## 2018-06-17 DIAGNOSIS — D471 Chronic myeloproliferative disease: Secondary | ICD-10-CM

## 2018-06-17 DIAGNOSIS — E782 Mixed hyperlipidemia: Secondary | ICD-10-CM | POA: Diagnosis not present

## 2018-06-17 DIAGNOSIS — Z87442 Personal history of urinary calculi: Secondary | ICD-10-CM

## 2018-06-17 DIAGNOSIS — Z86711 Personal history of pulmonary embolism: Secondary | ICD-10-CM | POA: Insufficient documentation

## 2018-06-17 DIAGNOSIS — K219 Gastro-esophageal reflux disease without esophagitis: Secondary | ICD-10-CM | POA: Diagnosis not present

## 2018-06-17 DIAGNOSIS — Z7901 Long term (current) use of anticoagulants: Secondary | ICD-10-CM

## 2018-06-17 DIAGNOSIS — C649 Malignant neoplasm of unspecified kidney, except renal pelvis: Secondary | ICD-10-CM

## 2018-06-17 DIAGNOSIS — I82402 Acute embolism and thrombosis of unspecified deep veins of left lower extremity: Secondary | ICD-10-CM

## 2018-06-17 DIAGNOSIS — E559 Vitamin D deficiency, unspecified: Secondary | ICD-10-CM

## 2018-06-17 DIAGNOSIS — Z86718 Personal history of other venous thrombosis and embolism: Secondary | ICD-10-CM | POA: Diagnosis not present

## 2018-06-17 LAB — CBC WITH DIFFERENTIAL/PLATELET
Abs Immature Granulocytes: 0.01 10*3/uL (ref 0.00–0.07)
Basophils Absolute: 0.1 10*3/uL (ref 0.0–0.1)
Basophils Relative: 1 %
Eosinophils Absolute: 0.3 10*3/uL (ref 0.0–0.5)
Eosinophils Relative: 5 %
HCT: 45.5 % (ref 36.0–46.0)
Hemoglobin: 14.3 g/dL (ref 12.0–15.0)
Immature Granulocytes: 0 %
Lymphocytes Relative: 39 %
Lymphs Abs: 2.5 10*3/uL (ref 0.7–4.0)
MCH: 31.4 pg (ref 26.0–34.0)
MCHC: 31.4 g/dL (ref 30.0–36.0)
MCV: 100 fL (ref 80.0–100.0)
Monocytes Absolute: 0.6 10*3/uL (ref 0.1–1.0)
Monocytes Relative: 10 %
Neutro Abs: 2.8 10*3/uL (ref 1.7–7.7)
Neutrophils Relative %: 45 %
Platelets: 289 10*3/uL (ref 150–400)
RBC: 4.55 MIL/uL (ref 3.87–5.11)
RDW: 13.7 % (ref 11.5–15.5)
WBC: 6.3 10*3/uL (ref 4.0–10.5)
nRBC: 0 % (ref 0.0–0.2)

## 2018-06-17 NOTE — Telephone Encounter (Signed)
Gave avs and calendar ° °

## 2018-06-17 NOTE — Progress Notes (Signed)
[facility] Follow up:    Unk Pinto, MD [place] [address] [id number]   DIAGNOSIS: myeloproliferative disorder  SUMMARY OF ONCOLOGIC HISTORY: Kimberly Watson was referred here after recent diagnosis of severe left lower extremity DVT with bilateral PE and cor pulmonale. I reviewed her records extensively and collaborated the history with the patient. The patient has been taking Evista for over 25 years due to strong family history of breast cancer and for osteoporosis management. In January 2017, she had viral gastroenteritis with severe diarrhea and dehydration. The patient has been taking Lasix chronically for mild bilateral lower extremity edema, left greater than the right. 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 She recalled having profuse diarrhea nonstop for 24 hours leading up to the blood clot event. Of note, prior to the diagnosis, she was not taking aspirin consistently  Prior to that she denies recent history of trauma, long distance travel, recent surgery, smoking or prolonged immobilization. She had no prior history or diagnosis of cancer. Her age appropriate screening programs are up-to-date. She had prior surgeries before and never had perioperative thromboembolic events. The patient had been exposed to birth control pills and never had thrombotic events. The patient had been pregnant before and denies history of peripartum thromboembolic event or history of recurrent miscarriages. There is no family history of blood clots or miscarriages.  On 08/18/2015, she presented to the emergency department due to symptoms of severe weakness, chest pressure, shortness of breath and near syncopal episode. She had extensive evaluation in the emergency room. D-dimer was elevated at greater than 20.  CT angiogram dated 08/19/2015 show massive saddle pulmonary embolus with evidence of  severe right heart strain.  Left lower extremity venous Doppler on 08/19/2015 showed findings consistent with acute deep vein thrombosis involving the left femoral vein, left popliteal vein, left posterior tibial vein and left peroneal vein.  Echocardiogram on 08/19/2015 show preserved ejection fraction but with right ventricular dilatation, moderate tricuspid valvular regurgitation, oscillating density on TV(thought to be blood clot in transit) and moderate to severe increased right systolic pressure/evidence of severe pulmonary hypertension  The patient was admitted with IV heparin anticoagulation therapy and placement of IVC filter on 08/20/2015.  Echocardiogram was repeated on 08/23/2015 which showed persistent elevated pulmonary hypertension. The mass over the right tricuspid valve is no longer visible She was hospitalized on 08/18/2015 to 08/24/2015. She was discharged home on Eliquis.  Evista was discontinued. 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  She is tolerating the Apixaban and Hydrea well.  She is taking these medications as prescribed.  She denies any easy bruising or bleeding today.     Patient Active Problem List   Diagnosis Date Noted  . 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 Goals of care, counseling/discussion 09/13/2017  . Myeloproliferative disorder ([place]) 08/27/2017  . Hiatal hernia 08/09/2017  . Diverticulosis 08/09/2017  . Osteoarthritis of right knee 08/09/2017   . 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 DVT (deep venous thrombosis) ([place])   .  Osteoporosis 03/30/2015  . Medication management 03/30/2015  . Essential hypertension 06/18/2013  . Hyperlipidemia, mixed 06/18/2013  . Other abnormal glucose 06/18/2013  . Vitamin D deficiency 06/18/2013  . GERD (gastroesophageal reflux disease) 06/18/2013  . Nephrolithiasis 06/18/2013    is allergic to evista [raloxifene] and fosamax [alendronate sodium].  MEDICAL HISTORY: Past Medical History:  Diagnosis Date  . Cataract   . Clotting disorder ([place])   . DVT of lower extremity (deep venous thrombosis) ([place])    left, resolved  . Elevated hemoglobin A1c   . GERD (gastroesophageal reflux disease)   . H/O left knee surgery   . Hypertension   . JAK2 gene mutation   . Leiomyoma    adnexal Leiomyoma removal in 1/11  . Myeloproliferative disorder ([place])    ? P vera  . Nephrolithiasis   . Osteoporosis   . PE (pulmonary embolism) 08/2015    SURGICAL HISTORY: Past Surgical History:  Procedure Laterality Date  . CHOLECYSTECTOMY    . cyst removed off spine    . IVC FILTER PLACEMENT ([facility] HX)  08/2015  . JOINT REPLACEMENT Left total knee  . TONSILLECTOMY    . UTERINE FIBROID SURGERY      SOCIAL HISTORY: Social History   Socioeconomic History  . Marital status: Divorced    Spouse name: Not on file  . Number of children: 2  . Years of education: Not on file  . Highest education level: Not on file  Occupational History  . Occupation: Pharmacist, hospital    Comment: Retired  Scientific laboratory technician  . Financial resource strain: Not on file  . Food insecurity:    Worry: Not on file    Inability: Not on file  . Transportation needs:    Medical: Not on file    Non-medical: Not on file  Tobacco Use  . Smoking status: Never Smoker  . Smokeless tobacco: Never Used  Substance and Sexual Activity  . Alcohol use: No  . Drug use: No  . Sexual activity: Not on file    Comment: retired, next  of kin, 1 son and daughter.  Lifestyle  . Physical activity:    Days per week: Not on file    Minutes per session: Not on file  . Stress: Not on file  Relationships  . Social connections:    Talks on phone: Not on file    Gets together: Not on file    Attends religious service: Not on file    Active member of club or organization: Not on file    Attends meetings of clubs or organizations: Not on file    Relationship status: Not on file  . Intimate partner violence:    Fear of current or ex partner: Not on file    Emotionally abused: Not on file    Physically abused: Not on file    Forced sexual activity: Not on file  Other Topics Concern  . Not on file  Social History Narrative   Divorced, 2 children lives alone.  Lived with a smoker for years.    Retired Radio producer   No alcohol tobacco or drug use   10/08/2017       FAMILY HISTORY: Family History  Problem Relation Age of Onset  . Hypertension Mother   . Diabetes Mother   . Breast cancer Mother   . Congestive Heart Failure Mother        Died age 2  . Osteoporosis Father   . Colon cancer Neg Hx   .  Colon polyps Neg Hx   . Esophageal cancer Neg Hx   . Rectal cancer Neg Hx   . Stomach cancer Neg Hx     Review of Systems  Constitutional: Negative for appetite change, chills, fatigue, fever and unexpected weight change.  HENT:   Negative for hearing loss, lump/mass, sore throat and trouble swallowing.   Eyes: Negative for eye problems and icterus.  Respiratory: Negative for chest tightness, cough and shortness of breath.   Cardiovascular: Negative for chest pain, leg swelling and palpitations.  Gastrointestinal: Negative for abdominal distention, abdominal pain, constipation, diarrhea, nausea and vomiting.  Endocrine: Negative for hot flashes.  Genitourinary: Negative for difficulty urinating.   Musculoskeletal: Negative for arthralgias.  Skin: Negative for itching and rash.  Neurological: Negative for dizziness,  extremity weakness, headaches and numbness.  Hematological: Negative for adenopathy. Does not bruise/bleed easily.  Psychiatric/Behavioral: Negative for depression. The patient is not nervous/anxious.       PHYSICAL EXAMINATION  ECOG PERFORMANCE STATUS: 0 - Asymptomatic  Vitals:   06/17/18 1321  BP: (!) 144/85  Pulse: (!) 58  Resp: 19  Temp: 98.1 F (36.7 C)  SpO2: 97%    Physical Exam  Constitutional: She is oriented to person, place, and time. She appears well-developed and well-nourished.  HENT:  Head: Normocephalic and atraumatic.  Mouth/Throat: Oropharynx is clear and moist. No oropharyngeal exudate.  Eyes: Pupils are equal, round, and reactive to light. No scleral icterus.  Neck: Neck supple.  Cardiovascular: Normal rate, regular rhythm and normal heart sounds.  Pulmonary/Chest: Effort normal and breath sounds normal.  Abdominal: Soft. Bowel sounds are normal.  Musculoskeletal: She exhibits no edema.  Lymphadenopathy:    She has no cervical adenopathy.  Neurological: She is alert and oriented to person, place, and time.  Skin: Skin is warm and dry. Capillary refill takes less than 2 seconds.  Psychiatric: She has a normal mood and affect.    LABORATORY DATA:  CBC    Component Value Date/Time   WBC 6.3 06/17/2018 1251   RBC 4.55 06/17/2018 1251   HGB 14.3 06/17/2018 1251   HCT 45.5 06/17/2018 1251   PLT 289 06/17/2018 1251   MCV 100.0 06/17/2018 1251   MCH 31.4 06/17/2018 1251   MCHC 31.4 06/17/2018 1251   RDW 13.7 06/17/2018 1251   LYMPHSABS 2.5 06/17/2018 1251   MONOABS 0.6 06/17/2018 1251   EOSABS 0.3 06/17/2018 1251   BASOSABS 0.1 06/17/2018 1251    CMP     Component Value Date/Time   NA 142 05/20/2018 1040   K 4.5 05/20/2018 1040   CL 102 05/20/2018 1040   CO2 29 05/20/2018 1040   GLUCOSE 91 05/20/2018 1040   BUN 23 05/20/2018 1040   CREATININE 1.00 (H) 05/20/2018 1040   CALCIUM 10.4 05/20/2018 1040   PROT 7.6 05/20/2018 1040   ALBUMIN  3.5 09/25/2017 1102   AST 19 05/20/2018 1040   ALT 14 05/20/2018 1040   ALKPHOS 75 09/25/2017 1102   BILITOT 1.2 05/20/2018 1040   GFRNONAA 54 (L) 05/20/2018 1040   GFRAA 62 05/20/2018 1040       ASSESSMENT and THERAPY PLAN:   DVT (deep venous thrombosis) ([facility]) Tolerating Apixaban well.  No sign of recurrence.  No easy bruising or bleeding, will continue and f/u in 3 months.   Myeloproliferative disorder ([place]) On Hydrea 500mg  daily.  CBC is stable.  She will continue her current dose and see Dr. Alvy Bimler in 3 months.     All  questions were answered. The patient knows to call the clinic with any problems, questions or concerns. We can certainly see the patient much sooner if necessary.  A total of (20) minutes of face-to-face time was spent with this patient with greater than 50% of that time in counseling and care-coordination.  This note was electronically signed.  Scot Dock, NP 06/17/2018

## 2018-06-17 NOTE — Assessment & Plan Note (Signed)
On Hydrea 500mg  daily.  CBC is stable.  She will continue her current dose and see Dr. Alvy Bimler in 3 months.

## 2018-06-17 NOTE — Assessment & Plan Note (Signed)
Tolerating Apixaban well.  No sign of recurrence.  No easy bruising or bleeding, will continue and f/u in 3 months.

## 2018-07-01 ENCOUNTER — Other Ambulatory Visit: Payer: Self-pay | Admitting: Hematology and Oncology

## 2018-07-01 DIAGNOSIS — D471 Chronic myeloproliferative disease: Secondary | ICD-10-CM

## 2018-07-01 DIAGNOSIS — I82402 Acute embolism and thrombosis of unspecified deep veins of left lower extremity: Secondary | ICD-10-CM

## 2018-09-17 ENCOUNTER — Encounter: Payer: Self-pay | Admitting: Hematology and Oncology

## 2018-09-17 ENCOUNTER — Telehealth: Payer: Self-pay | Admitting: Hematology and Oncology

## 2018-09-17 ENCOUNTER — Inpatient Hospital Stay (HOSPITAL_BASED_OUTPATIENT_CLINIC_OR_DEPARTMENT_OTHER): Payer: Medicare Other | Admitting: Hematology and Oncology

## 2018-09-17 ENCOUNTER — Other Ambulatory Visit: Payer: Self-pay | Admitting: Hematology and Oncology

## 2018-09-17 ENCOUNTER — Inpatient Hospital Stay: Payer: Medicare Other | Attending: Hematology and Oncology

## 2018-09-17 DIAGNOSIS — C946 Myelodysplastic disease, not classified: Secondary | ICD-10-CM | POA: Insufficient documentation

## 2018-09-17 DIAGNOSIS — D473 Essential (hemorrhagic) thrombocythemia: Secondary | ICD-10-CM

## 2018-09-17 DIAGNOSIS — Z7901 Long term (current) use of anticoagulants: Secondary | ICD-10-CM

## 2018-09-17 DIAGNOSIS — Z79899 Other long term (current) drug therapy: Secondary | ICD-10-CM | POA: Insufficient documentation

## 2018-09-17 DIAGNOSIS — Z803 Family history of malignant neoplasm of breast: Secondary | ICD-10-CM | POA: Diagnosis not present

## 2018-09-17 DIAGNOSIS — I82402 Acute embolism and thrombosis of unspecified deep veins of left lower extremity: Secondary | ICD-10-CM

## 2018-09-17 DIAGNOSIS — D471 Chronic myeloproliferative disease: Secondary | ICD-10-CM

## 2018-09-17 LAB — COMPREHENSIVE METABOLIC PANEL
ALT: 18 U/L (ref 0–44)
AST: 23 U/L (ref 15–41)
Albumin: 3.9 g/dL (ref 3.5–5.0)
Alkaline Phosphatase: 77 U/L (ref 38–126)
Anion gap: 9 (ref 5–15)
BUN: 18 mg/dL (ref 8–23)
CO2: 27 mmol/L (ref 22–32)
Calcium: 9.7 mg/dL (ref 8.9–10.3)
Chloride: 104 mmol/L (ref 98–111)
Creatinine, Ser: 1.01 mg/dL — ABNORMAL HIGH (ref 0.44–1.00)
GFR calc Af Amer: >60 mL/min (ref >60)
GFR calc non Af Amer: 53 mL/min — ABNORMAL LOW (ref >60)
Glucose, Bld: 99 mg/dL (ref 70–99)
Potassium: 4.1 mmol/L (ref 3.5–5.1)
Sodium: 140 mmol/L (ref 135–145)
Total Bilirubin: 0.9 mg/dL (ref 0.3–1.2)
Total Protein: 7.4 g/dL (ref 6.5–8.1)

## 2018-09-17 LAB — CBC WITH DIFFERENTIAL/PLATELET
Abs Immature Granulocytes: 0.06 10*3/uL (ref 0.00–0.07)
Basophils Absolute: 0.1 10*3/uL (ref 0.0–0.1)
Basophils Relative: 1 %
Eosinophils Absolute: 0.3 10*3/uL (ref 0.0–0.5)
Eosinophils Relative: 4 %
HCT: 46.8 % — ABNORMAL HIGH (ref 36.0–46.0)
Hemoglobin: 15 g/dL (ref 12.0–15.0)
Immature Granulocytes: 1 %
Lymphocytes Relative: 37 %
Lymphs Abs: 2.5 10*3/uL (ref 0.7–4.0)
MCH: 31.9 pg (ref 26.0–34.0)
MCHC: 32.1 g/dL (ref 30.0–36.0)
MCV: 99.6 fL (ref 80.0–100.0)
Monocytes Absolute: 0.6 10*3/uL (ref 0.1–1.0)
Monocytes Relative: 8 %
Neutro Abs: 3.3 10*3/uL (ref 1.7–7.7)
Neutrophils Relative %: 49 %
Platelets: 301 10*3/uL (ref 150–400)
RBC: 4.7 MIL/uL (ref 3.87–5.11)
RDW: 13.2 % (ref 11.5–15.5)
WBC: 6.7 10*3/uL (ref 4.0–10.5)
nRBC: 0 % (ref 0.0–0.2)

## 2018-09-17 NOTE — Progress Notes (Signed)
[place] OFFICE PROGRESS NOTE  Patient Care Team: Unk Pinto, MD as PCP - General (Internal Medicine) Monna Fam, MD as Consulting Physician (Ophthalmology) Gatha Mayer, MD as Consulting Physician (Gastroenterology) Kathie Rhodes, MD as Consulting Physician (Urology) Meisinger, Sherren Mocha, MD as Consulting Physician (Obstetrics and Gynecology)  ASSESSMENT & PLAN:  Myeloproliferative disorder [facility]) She tolerated hydroxyurea very well She has normalization of her CBC after 3 months Since she has frequent blood count monitoring through her primary care doctor, I plan to see her again in July, 3 months away from the anticipated visit with her primary care doctor. The plan will be to continue indefinitely.  She is warned to call me if she feels ill with infection, stress or planning to have surgery  DVT (deep venous thrombosis) ([place]) She had history of recurrent thrombosis She is doing well on chronic anticoagulation therapy I recommend she continue the same and indefinitely. So far, she has no bleeding complications   No orders of the defined types were placed in this encounter.   INTERVAL HISTORY: Please see below for problem oriented charting. She returns for further follow-up She is doing well Denies recent infection, fever or chills The patient denies any recent signs or symptoms of bleeding such as spontaneous epistaxis, hematuria or hematochezia.   SUMMARY OF ONCOLOGIC HISTORY:  Kimberly Watson was referred here after recent diagnosis of severe left lower extremity DVT with bilateral PE and cor pulmonale. I reviewed her records extensively and collaborated the history with the patient. The patient has been taking Evista for over 25 years due to strong family history of breast cancer and for osteoporosis management. In January 2017, she had viral gastroenteritis with severe diarrhea and dehydration. The patient has been taking Lasix chronically for  mild bilateral lower extremity edema, left greater than the right. 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 She recalled having profuse diarrhea nonstop for 24 hours leading up to the blood clot event. Of note, prior to the diagnosis, she was not taking aspirin consistently  Prior to that she denies recent history of trauma, long distance travel, recent surgery, smoking or prolonged immobilization. She had no prior history or diagnosis of cancer. Her age appropriate screening programs are up-to-date. She had prior surgeries before and never had perioperative thromboembolic events. The patient had been exposed to birth control pills and never had thrombotic events. The patient had been pregnant before and denies history of peripartum thromboembolic event or history of recurrent miscarriages. There is no family history of blood clots or miscarriages.  On 08/18/2015, she presented to the emergency department due to symptoms of severe weakness, chest pressure, shortness of breath and near syncopal episode. She had extensive evaluation in the emergency room. D-dimer was elevated at greater than 20.  CT angiogram dated 08/19/2015 show massive saddle pulmonary embolus with evidence of severe right heart strain.  Left lower extremity venous Doppler on 08/19/2015 showed findings consistent with acute deep vein thrombosis involving the left femoral vein, left popliteal vein, left posterior tibial vein and left peroneal vein.  Echocardiogram on 08/19/2015 show preserved ejection fraction but with right ventricular dilatation, moderate tricuspid valvular regurgitation, oscillating density on TV(thought to be blood clot in transit) and moderate to severe increased right systolic pressure/evidence of severe pulmonary hypertension  The patient was admitted with IV heparin anticoagulation therapy and placement of IVC filter on 08/20/2015.  Echocardiogram was  repeated on 08/23/2015 which showed persistent  elevated pulmonary hypertension. The mass over the right tricuspid valve is no longer visible She was hospitalized on 08/18/2015 to 08/24/2015. She was discharged home on Eliquis.  Evista was discontinued. 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 On 09/17/2017, she started taking hydroxyurea 500 mg daily  REVIEW OF SYSTEMS:   Constitutional: Denies fevers, chills or abnormal weight loss Eyes: Denies blurriness of vision Ears, nose, mouth, throat, and face: Denies mucositis or sore throat Respiratory: Denies cough, dyspnea or wheezes Cardiovascular: Denies palpitation, chest discomfort or lower extremity swelling Gastrointestinal:  Denies nausea, heartburn or change in bowel habits Skin: Denies abnormal skin rashes Lymphatics: Denies new lymphadenopathy or easy bruising Neurological:Denies numbness, tingling or new weaknesses Behavioral/Psych: Mood is stable, no new changes  All other systems were reviewed with the patient and are negative.  I have reviewed the past medical history, past surgical history, social history and family history with the patient and they are unchanged from previous note.  ALLERGIES:  is allergic to evista [raloxifene] and fosamax [alendronate sodium].  MEDICATIONS:  Current Outpatient Medications  Medication Sig Dispense Refill  . amoxicillin (AMOXIL) 500 MG capsule Take 4 capsules 2 hours before Dental Procedure 12 capsule 1  . apixaban (ELIQUIS) 2.5 MG TABS tablet Take 1 tablet (2.5 mg total) by mouth 2 (two) times daily. 90 tablet 11  . calcium carbonate (OS-CAL) 600 MG TABS tablet Take 600 mg by mouth daily.    . Cholecalciferol (VITAMIN D PO) Take 5,000  Units by mouth 3 (three) times daily.     . furosemide (LASIX) 40 MG tablet TAKE 1 TABLET BY MOUTH EVERY DAY 90 tablet 1  . hydroxyurea (HYDREA) 500 MG capsule Take 1 capsule (500 mg total) by mouth daily. May take with food to minimize GI side effects. 90 capsule 3  . MAGNESIUM PO Take 1 tablet by mouth 2 (two) times daily.     . metoprolol succinate (TOPROL-XL) 100 MG 24 hr tablet TAKE 1 TABLET BY MOUTH EVERY DAY TAKE WITH OR IMMEDIATELY AFTER A MEAL 90 tablet 1   No current facility-administered medications for this visit.     PHYSICAL EXAMINATION: ECOG PERFORMANCE STATUS: 0 - Asymptomatic  Vitals:   09/17/18 1154  BP: 140/72  Pulse: 62  Resp: 18  Temp: 97.8 F (36.6 C)  SpO2: 98%   Filed Weights   09/17/18 1154  Weight: 171 lb 12.8 oz (77.9 kg)    GENERAL:alert, no distress and comfortable NEURO: alert & oriented x 3 with fluent speech, no focal motor/sensory deficits  LABORATORY DATA:  I have reviewed the data as listed    Component Value Date/Time   NA 140 09/17/2018 1135   K 4.1 09/17/2018 1135   CL 104 09/17/2018 1135   CO2 27 09/17/2018 1135   GLUCOSE 99 09/17/2018 1135   BUN 18 09/17/2018 1135   CREATININE 1.01 (H) 09/17/2018 1135   CREATININE 1.00 (H) 05/20/2018 1040   CALCIUM 9.7 09/17/2018 1135   PROT 7.4 09/17/2018 1135   ALBUMIN 3.9 09/17/2018 1135   AST 23 09/17/2018 1135   ALT 18 09/17/2018 1135   ALKPHOS 77 09/17/2018 1135   BILITOT 0.9 09/17/2018 1135   GFRNONAA 53 (L) 09/17/2018 1135   GFRNONAA 54 (L) 05/20/2018 1040   GFRAA >60 09/17/2018 1135   GFRAA  62 05/20/2018 1040    No results found for: SPEP, UPEP  Lab Results  Component Value Date   WBC 6.7 09/17/2018   NEUTROABS 3.3 09/17/2018   HGB 15.0 09/17/2018   HCT 46.8 (H) 09/17/2018   MCV 99.6 09/17/2018   PLT 301 09/17/2018      Chemistry      Component Value Date/Time   NA 140 09/17/2018 1135   K 4.1 09/17/2018 1135   CL 104 09/17/2018 1135   CO2 27 09/17/2018 1135   BUN  18 09/17/2018 1135   CREATININE 1.01 (H) 09/17/2018 1135   CREATININE 1.00 (H) 05/20/2018 1040      Component Value Date/Time   CALCIUM 9.7 09/17/2018 1135   ALKPHOS 77 09/17/2018 1135   AST 23 09/17/2018 1135   ALT 18 09/17/2018 1135   BILITOT 0.9 09/17/2018 1135       All questions were answered. The patient knows to call the clinic with any problems, questions or concerns. No barriers to learning was detected.  I spent 10 minutes counseling the patient face to face. The total time spent in the appointment was 15 minutes and more than 50% was on counseling and review of test results  [name], MD 09/17/2018 1:32 PM

## 2018-09-17 NOTE — Assessment & Plan Note (Signed)
She tolerated hydroxyurea very well She has normalization of her CBC after 3 months Since she has frequent blood count monitoring through her primary care doctor, I plan to see her again in July, 3 months away from the anticipated visit with her primary care doctor. The plan will be to continue indefinitely.  She is warned to call me if she feels ill with infection, stress or planning to have surgery

## 2018-09-17 NOTE — Telephone Encounter (Signed)
Gave avs and calendar ° °

## 2018-09-17 NOTE — Assessment & Plan Note (Signed)
She had history of recurrent thrombosis She is doing well on chronic anticoagulation therapy I recommend she continue the same and indefinitely. So far, she has no bleeding complications 

## 2018-10-08 ENCOUNTER — Other Ambulatory Visit: Payer: Self-pay | Admitting: Internal Medicine

## 2018-11-26 ENCOUNTER — Encounter: Payer: Self-pay | Admitting: Internal Medicine

## 2018-12-12 ENCOUNTER — Other Ambulatory Visit: Payer: Self-pay | Admitting: Internal Medicine

## 2018-12-27 ENCOUNTER — Emergency Department (HOSPITAL_COMMUNITY): Payer: Medicare Other

## 2018-12-27 ENCOUNTER — Emergency Department (HOSPITAL_COMMUNITY)
Admission: EM | Admit: 2018-12-27 | Discharge: 2018-12-27 | Disposition: A | Discharge status: Home / Self Care | Payer: Medicare Other | Attending: Emergency Medicine | Admitting: Emergency Medicine

## 2018-12-27 ENCOUNTER — Other Ambulatory Visit: Payer: Self-pay

## 2018-12-27 ENCOUNTER — Encounter (HOSPITAL_COMMUNITY): Payer: Self-pay

## 2018-12-27 DIAGNOSIS — S0081XA Abrasion of other part of head, initial encounter: Secondary | ICD-10-CM | POA: Diagnosis not present

## 2018-12-27 DIAGNOSIS — Z7901 Long term (current) use of anticoagulants: Secondary | ICD-10-CM | POA: Insufficient documentation

## 2018-12-27 DIAGNOSIS — I10 Essential (primary) hypertension: Secondary | ICD-10-CM | POA: Insufficient documentation

## 2018-12-27 DIAGNOSIS — W010XXA Fall on same level from slipping, tripping and stumbling without subsequent striking against object, initial encounter: Secondary | ICD-10-CM | POA: Diagnosis not present

## 2018-12-27 DIAGNOSIS — Y9389 Activity, other specified: Secondary | ICD-10-CM | POA: Diagnosis not present

## 2018-12-27 DIAGNOSIS — Y92009 Unspecified place in unspecified non-institutional (private) residence as the place of occurrence of the external cause: Secondary | ICD-10-CM | POA: Insufficient documentation

## 2018-12-27 DIAGNOSIS — Y999 Unspecified external cause status: Secondary | ICD-10-CM | POA: Diagnosis not present

## 2018-12-27 DIAGNOSIS — Z79899 Other long term (current) drug therapy: Secondary | ICD-10-CM | POA: Insufficient documentation

## 2018-12-27 DIAGNOSIS — Z23 Encounter for immunization: Secondary | ICD-10-CM | POA: Insufficient documentation

## 2018-12-27 DIAGNOSIS — S0990XA Unspecified injury of head, initial encounter: Secondary | ICD-10-CM | POA: Diagnosis present

## 2018-12-27 DIAGNOSIS — S0181XA Laceration without foreign body of other part of head, initial encounter: Secondary | ICD-10-CM

## 2018-12-27 MED ORDER — LIDOCAINE-EPINEPHRINE (PF) 2 %-1:200000 IJ SOLN
5.0000 mL | Freq: Once | INTRAMUSCULAR | Status: AC
  Administered 2018-12-27: 5 mL via INTRADERMAL
  Filled 2018-12-27: qty 10

## 2018-12-27 MED ORDER — TETANUS-DIPHTH-ACELL PERTUSSIS 5-2.5-18.5 LF-MCG/0.5 IM SUSP
0.5000 mL | Freq: Once | INTRAMUSCULAR | Status: AC
  Administered 2018-12-27: 0.5 mL via INTRAMUSCULAR
  Filled 2018-12-27: qty 0.5

## 2018-12-27 NOTE — ED Triage Notes (Signed)
Patient reports that her toenail got caught on the carpet and she tripped, landing on her knees then her head hit the floor. Patient has laceration and abrasion on the nose. Patient takes Eliquis  Patient denies any blurred vision, nausea or LOC.

## 2018-12-27 NOTE — Discharge Instructions (Addendum)
Thank you for allowing me to care for you today. Please return to the emergency department if you have new or worsening symptoms. Take your medications as instructed.  Apply neosporin to the wound on your nose daily to keep it moist and help it heal

## 2018-12-27 NOTE — ED Notes (Addendum)
Wound cleaned. Pt ambulatory to bathroom w/o difficulty

## 2018-12-27 NOTE — ED Provider Notes (Signed)
[facility] DEPT Provider Note   CSN: [id number] Arrival date & time: 12/27/18  1341    History   Chief Complaint Chief Complaint  Patient presents with  . Fall  . Facial Laceration    HPI Kimberly Watson is a 78 y.o. female.     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 Patient reports that today , about 1 hour prior to arrival she was walking barefoot in her home when her toenail got caught on her rug and she fell forward.  Reports that she fell forward onto her knees and then onto her face.  She reports that her glasses caused a laceration to her nose and her forehead.  She hit her head on the rug.  Did not pass out.  Was able to get up on her own and has been feeling her normal self since then.  Unknown last tetanus shot.  No other complaints.     Past Medical History:  Diagnosis Date  . Cataract   . Clotting disorder ([place])   . DVT of lower extremity (deep venous thrombosis) ([place])    left, resolved  . Elevated hemoglobin A1c   . GERD (gastroesophageal reflux disease)   . H/O left knee surgery   . Hypertension   . JAK2 gene mutation   . Leiomyoma    adnexal Leiomyoma removal in 1/11  . 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 Nephrolithiasis   . Osteoporosis   . PE (pulmonary embolism) 08/2015    Patient Active Problem List   Diagnosis Date Noted  . 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 Goals of care, counseling/discussion 09/13/2017  . 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 Hiatal hernia 08/09/2017  . Diverticulosis 08/09/2017  . Osteoarthritis of right knee 08/09/2017  . 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 DVT (deep venous thrombosis) ([place])   . Osteoporosis 03/30/2015  . Medication management 03/30/2015  . Essential hypertension 06/18/2013  . Hyperlipidemia, mixed 06/18/2013  . Other abnormal glucose  06/18/2013  . Vitamin D deficiency 06/18/2013  . GERD (gastroesophageal reflux disease) 06/18/2013  . Nephrolithiasis 06/18/2013    Past Surgical History:  Procedure Laterality Date  . CHOLECYSTECTOMY    . cyst removed off spine    . IVC FILTER PLACEMENT ([facility] HX)  08/2015  . JOINT REPLACEMENT Left total knee  . TONSILLECTOMY    . UTERINE FIBROID SURGERY       OB History   No obstetric history on file.      Home Medications    Prior to Admission medications   Medication Sig Start Date End Date Taking? Authorizing Provider  calcium carbonate (OS-CAL) 600 MG TABS tablet Take 600 mg by mouth daily.   Yes [provider]  Cholecalciferol (VITAMIN D PO) Take 5,000 Units by mouth 2 (two) times a day.    Yes [provider]  ELIQUIS 2.5 MG TABS tablet TAKE 1 TABLET BY MOUTH TWICE A DAY Patient taking differently: Take 2.5 mg by mouth 2 (two) times daily.  09/17/18  Yes Gorsuch, Ni, MD  furosemide (LASIX) 40 MG tablet TAKE 1 TABLET BY MOUTH EVERY DAY Patient taking differently: Take 40 mg by mouth daily.  12/12/18  Yes Unk Pinto, MD  hydroxyurea (HYDREA) 500 MG capsule Take 1 capsule (500 mg total) by mouth daily. May take with food to minimize GI side effects. 12/10/17  Yes Gorsuch, Ni, MD  MAGNESIUM PO Take 1 tablet by mouth 2 (two) times daily.    Yes [provider]  metoprolol succinate (TOPROL-XL) 100 MG 24 hr tablet TAKE 1 TABLET BY MOUTH EVERY DAY TAKE WITH OR IMMEDIATELY AFTER A MEAL Patient taking differently: Take 100 mg by mouth daily. TAKE 1 TABLET BY MOUTH EVERY DAY TAKE WITH OR IMMEDIATELY AFTER A MEAL 10/08/18  Yes Unk Pinto, MD  OVER THE COUNTER MEDICATION Take 1 tablet by mouth daily as needed (allergies). OTC allergy pill   Yes [provider]  polyvinyl alcohol (LIQUIFILM TEARS) 1.4 % ophthalmic solution Place 1 drop into both eyes as needed for dry eyes.   Yes [provider]  sodium chloride (OCEAN) 0.65 % SOLN  nasal spray Place 1 spray into both nostrils as needed for congestion.   Yes [provider]  amoxicillin (AMOXIL) 500 MG capsule Take 4 capsules 2 hours before Dental Procedure Patient not taking: Reported on 12/27/2018 12/31/17   Unk Pinto, MD    Family History Family History  Problem Relation Age of Onset  . Hypertension Mother   . Diabetes Mother   . Breast cancer Mother   . Congestive Heart Failure Mother        Died age 67  . Osteoporosis Father   . Colon cancer Neg Hx   . Colon polyps Neg Hx   . Esophageal cancer Neg Hx   . Rectal cancer Neg Hx   . Stomach cancer Neg Hx     Social History Social History   Tobacco Use  . Smoking status: Never Smoker  . Smokeless tobacco: Never Used  Substance Use Topics  . Alcohol use: No  . Drug use: No     Allergies   Evista [raloxifene] and Fosamax [alendronate sodium]   Review of Systems Review of Systems  Constitutional: Negative for activity change, appetite change, chills, diaphoresis, fatigue, fever and unexpected weight change.  Eyes: Negative for visual disturbance.  Gastrointestinal: Negative for nausea and vomiting.  Skin: Positive for wound.  Neurological: Positive for headaches. Negative for dizziness and light-headedness.  Hematological: Bruises/bleeds easily.     Physical Exam Updated Vital Signs BP (!) 146/106 (BP Location: Right Arm)   Pulse 79   Temp 97.9 F (36.6 C) (Oral)   Resp 16   Ht 5\' 2"  (1.575 m)   Wt 78.9 kg   SpO2 96%   BMI 31.83 kg/m   Physical Exam Vitals signs and nursing note reviewed. Exam conducted with a chaperone present.  Constitutional:      General: She is not in acute distress.    Appearance: Normal appearance. She is not ill-appearing, toxic-appearing or diaphoretic.  HENT:     Head: Normocephalic. Abrasion, contusion and laceration present. No raccoon eyes, Battle's sign or masses. Hair is normal.     Jaw: There is normal jaw occlusion. No malocclusion.    Eyes:     Extraocular Movements:     Right eye: Normal extraocular motion and no nystagmus.     Left eye: Normal extraocular motion and no nystagmus.     Conjunctiva/sclera: Conjunctivae normal.  Neck:     Musculoskeletal: Normal range of motion and neck supple.  Pulmonary:     Effort: Pulmonary effort is normal.  Skin:    General: Skin is dry.  Neurological:     Mental Status: She is alert.  Psychiatric:        Mood and Affect: Mood normal.  ED Treatments / Results  Labs (all labs ordered are listed, but only abnormal results are displayed) Labs Reviewed - No data to display  EKG None  Radiology Ct Head Wo Contrast  Result Date: 12/27/2018 CLINICAL DATA:  Facial injury after fall. EXAM: CT HEAD WITHOUT CONTRAST CT MAXILLOFACIAL WITHOUT CONTRAST TECHNIQUE: Multidetector CT imaging of the head and maxillofacial structures were performed using the standard protocol without intravenous contrast. Multiplanar CT image reconstructions of the maxillofacial structures were also generated. COMPARISON:  CT scan of August 19, 2015. FINDINGS: CT HEAD FINDINGS Brain: Mild chronic ischemic white matter disease is noted. No mass effect or midline shift is noted. Ventricular size is within normal limits. There is no evidence of mass lesion, hemorrhage or acute infarction. Vascular: No hyperdense vessel or unexpected calcification. Skull: Normal. Negative for fracture or focal lesion. Other: None. CT MAXILLOFACIAL FINDINGS Osseous: No fracture or mandibular dislocation. No destructive process. Orbits: Negative. No traumatic or inflammatory finding. Sinuses: Clear. Soft tissues: Soft tissue contusion is seen overlying bridge of nose. IMPRESSION: Mild chronic ischemic white matter disease. No acute intracranial abnormality seen. Soft tissue contusion overlying bridge of nose. No other definite abnormality seen in maxillofacial region. Electronically Signed   By: Marijo Conception M.D.   On:  12/27/2018 15:45   Ct Maxillofacial Wo Contrast  Result Date: 12/27/2018 CLINICAL DATA:  Facial injury after fall. EXAM: CT HEAD WITHOUT CONTRAST CT MAXILLOFACIAL WITHOUT CONTRAST TECHNIQUE: Multidetector CT imaging of the head and maxillofacial structures were performed using the standard protocol without intravenous contrast. Multiplanar CT image reconstructions of the maxillofacial structures were also generated. COMPARISON:  CT scan of August 19, 2015. FINDINGS: CT HEAD FINDINGS Brain: Mild chronic ischemic white matter disease is noted. No mass effect or midline shift is noted. Ventricular size is within normal limits. There is no evidence of mass lesion, hemorrhage or acute infarction. Vascular: No hyperdense vessel or unexpected calcification. Skull: Normal. Negative for fracture or focal lesion. Other: None. CT MAXILLOFACIAL FINDINGS Osseous: No fracture or mandibular dislocation. No destructive process. Orbits: Negative. No traumatic or inflammatory finding. Sinuses: Clear. Soft tissues: Soft tissue contusion is seen overlying bridge of nose. IMPRESSION: Mild chronic ischemic white matter disease. No acute intracranial abnormality seen. Soft tissue contusion overlying bridge of nose. No other definite abnormality seen in maxillofacial region. Electronically Signed   By: Marijo Conception M.D.   On: 12/27/2018 15:45    Procedures .Marland KitchenLaceration Repair Date/Time: 12/27/2018 4:01 PM Performed by: Alveria Apley, PA-C Authorized by: Alveria Apley, PA-C   Consent:    Consent obtained:  Verbal   Consent given by:  Patient   Risks discussed:  Infection, need for additional repair, pain, poor cosmetic result and poor wound healing   Alternatives discussed:  No treatment and delayed treatment Universal protocol:    Procedure explained and questions answered to patient or proxy's satisfaction: yes     Relevant documents present and verified: yes     Test results available and properly labeled: yes      Imaging studies available: yes     Required blood products, implants, devices, and special equipment available: yes     Site/side marked: yes     Immediately prior to procedure, a time out was called: yes     Patient identity confirmed:  Verbally with patient Anesthesia (see MAR for exact dosages):    Anesthesia method:  Local infiltration   Local anesthetic:  Lidocaine 1% WITH epi Laceration details:  Location:  Face   Face location:  Forehead   Length (cm):  1 Repair type:    Repair type:  Simple Pre-procedure details:    Preparation:  Patient was prepped and draped in usual sterile fashion Exploration:    Hemostasis achieved with:  Epinephrine   Wound extent: no fascia violation noted, no foreign bodies/material noted, no muscle damage noted, no nerve damage noted, no tendon damage noted and no underlying fracture noted   Treatment:    Area cleansed with:  Betadine and soap and water Skin repair:    Repair method:  Sutures   Suture size:  4-0   Suture material:  Nylon   Number of sutures:  3 Approximation:    Approximation:  Close Post-procedure details:    Dressing:  Open (no dressing)   Patient tolerance of procedure:  Tolerated well, no immediate complications   (including critical care time)  Medications Ordered in ED Medications  Tdap (BOOSTRIX) injection 0.5 mL (0.5 mLs Intramuscular Given 12/27/18 1437)  lidocaine-EPINEPHrine (XYLOCAINE W/EPI) 2 %-1:200000 (PF) injection 5 mL (5 mLs Intradermal Given by Other 12/27/18 1530)     Initial Impression / Assessment and Plan / ED Course  I have reviewed the triage vital signs and the nursing notes.  Pertinent labs & imaging results that were available during my care of the patient were reviewed by me and considered in my medical decision making (see chart for details).  Clinical Course as of Dec 26 1600  Fri Dec 26, 2169  5755 78 year old female on Eliquis with mechanical fall.  Will obtain CT of her head and  face due to her being on Eliquis.  Will cleanse her wounds and repair laceration to her forehead with sutures.  See procedure note.  Patient is otherwise feeling herself and if her CT scans are normal she may go home.   [KM]    Clinical Course User Index [KM] Alveria Apley, PA-C       Based on review of vitals, medical screening exam, lab work and/or imaging, there does not appear to be an acute, emergent etiology for the patient's symptoms. Counseled pt on good return precautions and encouraged both PCP and ED follow-up as needed.  Prior to discharge, I also discussed incidental imaging findings with patient in detail and advised appropriate, recommended follow-up in detail.  Clinical Impression: 1. Facial laceration, initial encounter   2. Injury of head, initial encounter     Disposition: Discharge  Prior to providing a prescription for a controlled substance, I independently reviewed the patient's recent prescription history on the [place]. The patient had no recent or regular prescriptions and was deemed appropriate for a brief, less than 3 day prescription of narcotic for acute analgesia.  This note was prepared with assistance of Systems analyst. Occasional wrong-word or sound-a-like substitutions may have occurred due to the inherent limitations of voice recognition software.   Final Clinical Impressions(s) / ED Diagnoses   Final diagnoses:  Facial laceration, initial encounter  Injury of head, initial encounter    ED Discharge Orders    None       Alveria Apley, PA-C 12/27/18 [place], MD 12/28/18 [phone]

## 2018-12-27 NOTE — ED Notes (Signed)
ED Provider at bedside. 

## 2019-01-03 ENCOUNTER — Telehealth: Payer: Self-pay | Admitting: *Deleted

## 2019-01-03 ENCOUNTER — Other Ambulatory Visit: Payer: Self-pay | Admitting: Hematology and Oncology

## 2019-01-03 MED ORDER — HYDROXYUREA 500 MG PO CAPS: 500.0000 mg | ORAL_CAPSULE | Freq: Every day | ORAL | 3 refills | Status: DC

## 2019-01-03 NOTE — Telephone Encounter (Signed)
done

## 2019-01-03 NOTE — Telephone Encounter (Signed)
Patient called to have her Hydroxyurea refilled. Please send to [place]

## 2019-01-07 ENCOUNTER — Other Ambulatory Visit: Payer: Self-pay

## 2019-01-07 ENCOUNTER — Ambulatory Visit: Payer: Medicare Other | Admitting: Adult Health Nurse Practitioner

## 2019-01-07 ENCOUNTER — Encounter: Payer: Self-pay | Admitting: Adult Health Nurse Practitioner

## 2019-01-07 VITALS — BP 138/72 | HR 68 | Temp 97.9°F | Wt 178.2 lb

## 2019-01-07 DIAGNOSIS — Z7901 Long term (current) use of anticoagulants: Secondary | ICD-10-CM

## 2019-01-07 DIAGNOSIS — W19XXXD Unspecified fall, subsequent encounter: Secondary | ICD-10-CM

## 2019-01-07 DIAGNOSIS — Z4802 Encounter for removal of sutures: Secondary | ICD-10-CM

## 2019-01-07 DIAGNOSIS — I10 Essential (primary) hypertension: Secondary | ICD-10-CM | POA: Diagnosis not present

## 2019-01-07 DIAGNOSIS — Y92009 Unspecified place in unspecified non-institutional (private) residence as the place of occurrence of the external cause: Secondary | ICD-10-CM

## 2019-01-07 MED ORDER — METOPROLOL SUCCINATE ER 100 MG PO TB24: 100.0000 mg | ORAL_TABLET | Freq: Every day | ORAL | 1 refills | Status: DC

## 2019-01-07 NOTE — Patient Instructions (Signed)
Suture Removal, Care After  This sheet gives you information about how to care for yourself after your procedure. Your health care provider may also give you more specific instructions. If you have problems or questions, contact your health care provider.  What can I expect after the procedure?  After your stitches (sutures) are removed, it is common to have:  · Some discomfort and swelling in the area.  · Slight redness in the area.  Follow these instructions at home:  If you have a bandage:  · Wash your hands with soap and water before you change your bandage (dressing). If soap and water are not available, use hand sanitizer.  · Change your dressing as told by your health care provider. If your dressing becomes wet or dirty, or develops a bad smell, change it as soon as possible.  · If your dressing sticks to your skin, soak it in warm water to loosen it.  Wound care    · Check your wound every day for signs of infection. Check for:  ? More redness, swelling, or pain.  ? Fluid or blood.  ? Warmth.  ? Pus or a bad smell.  · Wash your hands with soap and water before and after touching your wound.  · Apply cream or ointment only as directed by your health care provider. If you are using cream or ointment, wash the area with soap and water 2 times a day to remove all the cream or ointment. Rinse off the soap and pat the area dry with a clean towel.  · If you have skin glue or adhesive strips on your wound, leave these closures in place. They may need to stay in place for 2 weeks or longer. If adhesive strip edges start to loosen and curl up, you may trim the loose edges. Do not remove adhesive strips completely unless your health care provider tells you to do that.  · Keep the wound area dry and clean. Do not take baths, swim, or use a hot tub until your health care provider approves.  · Continue to protect the wound from injury.  · Do not pick at your wound. Picking can cause an infection.  · When your wound has  completely healed, wear sunscreen over it or cover it with clothing when you are outside. New scars get sunburned easily, which can make scarring worse.  General instructions  · Take over-the-counter and prescription medicines only as told by your health care provider.  · Keep all follow-up visits as told by your health care provider. This is important.  Contact a health care provider if:  · You have redness, swelling, or pain around your wound.  · You have fluid or blood coming from your wound.  · Your wound feels warm to the touch.  · You have pus or a bad smell coming from your wound.  · Your wound opens up.  Get help right away if:  · You have a fever.  · You have redness that is spreading from your wound.  Summary  · After your sutures are removed, it is common to have some discomfort and swelling in the area.  · Wash your hands with soap and water before you change your bandage (dressing).  · Keep the wound area dry and clean. Do not take baths, swim, or use a hot tub until your health care provider approves.  This information is not intended to replace advice given to you by your health care   provider. Make sure you discuss any questions you have with your health care provider.  Document Released: 04/11/2001 Document Revised: 08/22/2016 Document Reviewed: 08/22/2016  Elsevier Interactive Patient Education © 2019 [employer]

## 2019-01-07 NOTE — Progress Notes (Signed)
Assessment and Plan:  Kimberly Watson was seen today for other.  Diagnoses and all orders for this visit:  Encounter for removal of sutures Patient tolerate well Discussed S&S of infection Cleanse with mild soap and water, do not scrub.    Fall in home, subsequent encounter Discussed safety in the home No lose rugs or clutter Wear secure footwear  Essential hypertension -     metoprolol succinate (TOPROL-XL) 100 MG 24 hr tablet; Take 1 tablet (100 mg total) by mouth daily. TAKE 1 TABLET BY MOUTH EVERY DAY TAKE WITH OR IMMEDIATELY AFTER A MEAL       -   Lasix 40mg  daily in the morning.  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  Further disposition pending results of labs. Discussed med's effects and SE's.   Over 20 minutes of interview, exam, counseling, chart review, and critical decision making was performed.   Future Appointments  Date Time Provider [place]  01/27/2019 10:00 AM Unk Pinto, MD [facility] None  02/25/2019 12:00 PM [facility] 4 [facility] None  02/25/2019 12:30 PM [name], MD [facility] None  06/04/2019 11:15 AM Vicie Mutters, PA-C [facility] None  12/31/2019 10:00 AM Unk Pinto, MD [facility] None    ------------------------------------------------------------------------------------------------------------------   HPI 78 y.o.female presents for suture removal.  On 12/27/18 she had a mechanical fall with laceration repair and contusion to bridge of nose.  She has three sutures to forehead.  She denies any pain, fevers, exudate from wound, headaches, dizziness or vision change.  Past Medical History:  Diagnosis Date  . Cataract   . Clotting disorder ([place])   . DVT of lower extremity (deep venous thrombosis) ([place])    left, resolved  . Elevated hemoglobin A1c   . GERD (gastroesophageal reflux disease)   . H/O left  knee surgery   . Hypertension   . JAK2 gene mutation   . Leiomyoma    adnexal Leiomyoma removal in 1/11  . 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 Nephrolithiasis   . Osteoporosis   . PE (pulmonary embolism) 08/2015     Allergies  Allergen Reactions  . Evista [Raloxifene] Other (See Comments)    Blood clots - DVT and Pulmonary Embolism   . Fosamax [Alendronate Sodium]     esophagitis    Current Outpatient Medications on File Prior to Visit  Medication Sig  . amoxicillin (AMOXIL) 500 MG capsule Take 4 capsules 2 hours before Dental Procedure (Patient not taking: Reported on 12/27/2018)  . calcium carbonate (OS-CAL) 600 MG TABS tablet Take 600 mg by mouth daily.  . Cholecalciferol (VITAMIN D PO) Take 5,000 Units by mouth 2 (two) times a day.   Marland Kitchen ELIQUIS 2.5 MG TABS tablet TAKE 1 TABLET BY MOUTH TWICE A DAY (Patient taking differently: Take 2.5 mg by mouth 2 (two) times daily. )  . furosemide (LASIX) 40 MG tablet TAKE 1 TABLET BY MOUTH EVERY DAY (Patient taking differently: Take 40 mg by mouth daily. )  . hydroxyurea (HYDREA) 500 MG capsule Take 1 capsule (500 mg total) by mouth daily. May take with food to minimize GI side effects.  Marland Kitchen MAGNESIUM PO Take 1 tablet by mouth 2 (two) times daily.   . metoprolol succinate (TOPROL-XL) 100 MG 24 hr tablet TAKE 1 TABLET BY MOUTH EVERY DAY TAKE WITH OR IMMEDIATELY AFTER A MEAL (Patient taking differently: Take 100 mg  by mouth daily. TAKE 1 TABLET BY MOUTH EVERY DAY TAKE WITH OR IMMEDIATELY AFTER A MEAL)  . OVER THE COUNTER MEDICATION Take 1 tablet by mouth daily as needed (allergies). OTC allergy pill  . polyvinyl alcohol (LIQUIFILM TEARS) 1.4 % ophthalmic solution Place 1 drop into both eyes as needed for dry eyes.  . sodium chloride (OCEAN) 0.65 % SOLN nasal spray Place 1 spray into both nostrils as needed for congestion.   No current facility-administered medications on file prior to visit.     ROS: all negative except above.    Physical Exam:  BP 138/72   Pulse 68   Temp 97.9 F (36.6 C)   Wt 178 lb 3.2 oz (80.8 kg)   SpO2 96%   BMI 32.59 kg/m   General Appearance: Well nourished, in no apparent distress. Eyes: PERRLA, EOMs, conjunctiva no swelling or erythema Sinuses: No Frontal/maxillary tenderness ENT/Mouth: Ext aud canals clear, TMs without erythema, bulging. No erythema, swelling, or exudate on post pharynx.  Tonsils not swollen or erythematous. Hearing normal.  Neck: Supple, thyroid normal.  Skin: Warm, dry without rashes, lesions, ecchymosis noted to bridge of nose. Three sutures intact, wound well approximated, no exudate or erythema. Neuro: Cranial nerves intact. Normal muscle tone, no cerebellar symptoms. Sensation intact.  Psych: Awake and oriented X 3, normal affect, Insight and Judgment appropriate.    Skin cleaned around sutures with alcohol.  With sterile surgical scissors and tissue forceps.  All three sutures removed without complications.  Patient tolerated well.   Garnet Sierras, NP 2:31 PM [facility]

## 2019-01-08 ENCOUNTER — Encounter: Payer: Self-pay | Admitting: Adult Health Nurse Practitioner

## 2019-01-27 ENCOUNTER — Encounter: Payer: Self-pay | Admitting: Internal Medicine

## 2019-01-27 ENCOUNTER — Other Ambulatory Visit: Payer: Self-pay

## 2019-01-27 ENCOUNTER — Ambulatory Visit (INDEPENDENT_AMBULATORY_CARE_PROVIDER_SITE_OTHER): Payer: Medicare Other | Admitting: Internal Medicine

## 2019-01-27 VITALS — BP 132/80 | HR 68 | Temp 97.2°F | Resp 16 | Ht 63.0 in | Wt 180.4 lb

## 2019-01-27 DIAGNOSIS — Z136 Encounter for screening for cardiovascular disorders: Secondary | ICD-10-CM

## 2019-01-27 DIAGNOSIS — Z0000 Encounter for general adult medical examination without abnormal findings: Secondary | ICD-10-CM | POA: Diagnosis not present

## 2019-01-27 DIAGNOSIS — Z8249 Family history of ischemic heart disease and other diseases of the circulatory system: Secondary | ICD-10-CM

## 2019-01-27 DIAGNOSIS — E559 Vitamin D deficiency, unspecified: Secondary | ICD-10-CM

## 2019-01-27 DIAGNOSIS — M810 Age-related osteoporosis without current pathological fracture: Secondary | ICD-10-CM

## 2019-01-27 DIAGNOSIS — R7309 Other abnormal glucose: Secondary | ICD-10-CM

## 2019-01-27 DIAGNOSIS — I10 Essential (primary) hypertension: Secondary | ICD-10-CM | POA: Diagnosis not present

## 2019-01-27 DIAGNOSIS — E782 Mixed hyperlipidemia: Secondary | ICD-10-CM

## 2019-01-27 DIAGNOSIS — D471 Chronic myeloproliferative disease: Secondary | ICD-10-CM

## 2019-01-27 DIAGNOSIS — Z79899 Other long term (current) drug therapy: Secondary | ICD-10-CM

## 2019-01-27 DIAGNOSIS — Z7901 Long term (current) use of anticoagulants: Secondary | ICD-10-CM

## 2019-01-27 DIAGNOSIS — Z1211 Encounter for screening for malignant neoplasm of colon: Secondary | ICD-10-CM

## 2019-01-27 DIAGNOSIS — Z0001 Encounter for general adult medical examination with abnormal findings: Secondary | ICD-10-CM

## 2019-01-27 DIAGNOSIS — M1990 Unspecified osteoarthritis, unspecified site: Secondary | ICD-10-CM

## 2019-01-27 NOTE — Progress Notes (Signed)
[facility] Unk Pinto, M.D.     Uvaldo Bristle. Silverio Lay, P.A.-C Liane Comber, [place], [place] [address] Telephone [phone] Telefax [phone] _______________________________________________ Annual Screening/Preventative Visit & Comprehensive Evaluation &  Examination     This very nice 78 y.o. DWF presents for a Screening /Preventative Visit & comprehensive evaluation and management of multiple medical co-morbidities.  Patient has been followed for HTN, HLD, Prediabetes  and Vitamin D Deficiency. In Jan 2017, Patient was dx'd with DVT Saddle Embolus attributed to Evista (which was d/c'd) and she was started on Evista. Patient has been dx'd by Dr Alvy Bimler with a myeloproliferative disorder being treated with Hydrea.      HTN predates circa 1980's. Patient's BP has been controlled at home on Metoprolol & Furosemide. Patient denies any cardiac symptoms as chest pain, palpitations, shortness of breath, dizziness or ankle swelling. Today's BP is at goal - 132/80.      Patient has mild  hyperlipidemia controlled with diet. Last lipids were at goal: Lab Results  Component Value Date   CHOL 178 05/20/2018   HDL 60 05/20/2018   LDLCALC 97 05/20/2018   TRIG 111 05/20/2018   CHOLHDL 3.0 05/20/2018      Patient has hx/o Insulin resistance &n  is followed expectantly for glucose intolerance and patient denies reactive hypoglycemic symptoms, visual blurring, diabetic polys or paresthesias. Last A1c was Normal & at goal: Lab Results  Component Value Date   HGBA1C 5.4 11/14/2017      Finally, patient has history of Vitamin D Deficiency and last Vitamin D was at goal: Lab Results  Component Value Date   VD25OH 94 11/14/2017   Current Outpatient Medications on File Prior to Visit  Medication Sig  . amoxicillin (AMOXIL) 500 MG capsule Take 4 capsules 2 hours before Dental Procedure  .  calcium carbonate (OS-CAL) 600 MG TABS tablet Take 600 mg by mouth daily.  . Cholecalciferol (VITAMIN D PO) Take 5,000 Units by mouth 3 (three) times daily.   Marland Kitchen ELIQUIS 2.5 MG TABS tablet TAKE 1 TABLET BY MOUTH TWICE A DAY (Patient taking differently: Take 2.5 mg by mouth 2 (two) times daily. )  . furosemide (LASIX) 40 MG tablet TAKE 1 TABLET BY MOUTH EVERY DAY (Patient taking differently: Take 40 mg by mouth daily. )  . hydroxyurea (HYDREA) 500 MG capsule Take 1 capsule (500 mg total) by mouth daily. May take with food to minimize GI side effects.  Marland Kitchen MAGNESIUM PO Take 500 mg by mouth 2 (two) times daily.   . metoprolol succinate (TOPROL-XL) 100 MG 24 hr tablet Take 1 tablet (100 mg total) by mouth daily. TAKE 1 TABLET BY MOUTH EVERY DAY TAKE WITH OR IMMEDIATELY AFTER A MEAL  . OVER THE COUNTER MEDICATION Take 1 tablet by mouth daily as needed (allergies). OTC allergy pill  . polyvinyl alcohol (LIQUIFILM TEARS) 1.4 % ophthalmic solution Place 1 drop into both eyes as needed for dry eyes.  . sodium chloride (OCEAN) 0.65 % SOLN nasal spray Place 1 spray into both nostrils as needed for congestion.   No current facility-administered medications on file prior to visit.    Allergies  Allergen Reactions  . Evista [Raloxifene] Other (See Comments)    Blood clots - DVT and Pulmonary Embolism   . Fosamax [Alendronate Sodium]     esophagitis   Past Medical History:  Diagnosis Date  . Cataract   . Clotting disorder ([place])   .  DVT of lower extremity (deep venous thrombosis) ([place])    left, resolved  . Elevated hemoglobin A1c   . GERD (gastroesophageal reflux disease)   . H/O left knee surgery   . Hypertension   . JAK2 gene mutation   . Leiomyoma    adnexal Leiomyoma removal in 1/11  . Myeloproliferative disorder ([place])    ? P vera  . Nephrolithiasis   . Osteoporosis   . PE (pulmonary embolism) 08/2015   Health Maintenance  Topic Date Due  . INFLUENZA VACCINE  03/01/2019  . MAMMOGRAM   05/22/2019  . TETANUS/TDAP  12/26/2028  . DEXA SCAN  Completed  . PNA vac Low Risk Adult  Completed   Immunization History  Administered Date(s) Administered  . DT 03/30/2015  . Influenza, High Dose Seasonal PF 05/02/2017, 05/20/2018  . Pneumococcal Conjugate-13 03/25/2014  . Tdap 12/27/2018  . Zoster 12/27/2005   Last Colon - 11/01/2017 - Dr Carlean Purl - Negative - No f/u due to age  Last MGM - 05/21/2018  Past Surgical History:  Procedure Laterality Date  . CHOLECYSTECTOMY    . cyst removed off spine    . IVC FILTER PLACEMENT ([facility] HX)  08/2015  . JOINT REPLACEMENT Left total knee  . TONSILLECTOMY    . UTERINE FIBROID SURGERY     Family History  Problem Relation Age of Onset  . Hypertension Mother   . Diabetes Mother   . Breast cancer Mother   . Congestive Heart Failure Mother        Died age 22  . Osteoporosis Father   . Colon cancer Neg Hx   . Colon polyps Neg Hx   . Esophageal cancer Neg Hx   . Rectal cancer Neg Hx   . Stomach cancer Neg Hx    Social History   Tobacco Use  . Smoking status: Never Smoker  . Smokeless tobacco: Never Used  Substance Use Topics  . Alcohol use: No  . Drug use: No    ROS Constitutional: Denies fever, chills, weight loss/gain, headaches, insomnia,  night sweats, and change in appetite. Does c/o fatigue. Eyes: Denies redness, blurred vision, diplopia, discharge, itchy, watery eyes.  ENT: Denies discharge, congestion, post nasal drip, epistaxis, sore throat, earache, hearing loss, dental pain, Tinnitus, Vertigo, Sinus pain, snoring.  Cardio: Denies chest pain, palpitations, irregular heartbeat, syncope, dyspnea, diaphoresis, orthopnea, PND, claudication, edema Respiratory: denies cough, dyspnea, DOE, pleurisy, hoarseness, laryngitis, wheezing.  Gastrointestinal: Denies dysphagia, heartburn, reflux, water brash, pain, cramps, nausea, vomiting, bloating, diarrhea, constipation, hematemesis, melena, hematochezia, jaundice,  hemorrhoids Genitourinary: Denies dysuria, frequency, urgency, nocturia, hesitancy, discharge, hematuria, flank pain Breast: Breast lumps, nipple discharge, bleeding.  Musculoskeletal: Denies arthralgia, myalgia, stiffness, Jt. Swelling, pain, limp, and strain/sprain. Denies falls. Skin: Denies puritis, rash, hives, warts, acne, eczema, changing in skin lesion Neuro: No weakness, tremor, incoordination, spasms, paresthesia, pain Psychiatric: Denies confusion, memory loss, sensory loss. Denies Depression. Endocrine: Denies change in weight, skin, hair change, nocturia, and paresthesia, diabetic polys, visual blurring, hyper / hypo glycemic episodes.  Heme/Lymph: No excessive bleeding, bruising, enlarged lymph nodes.  Physical Exam  BP 132/80   Pulse 68   Temp (!) 97.2 F (36.2 C)   Resp 16   Ht 5\' 3"  (1.6 m)   Wt 180 lb 6.4 oz (81.8 kg)   BMI 31.96 kg/m   General Appearance: Well nourished, well groomed and in no apparent distress.  Eyes: PERRLA, EOMs, conjunctiva no swelling or erythema, normal fundi and vessels. Sinuses: No frontal/maxillary tenderness  ENT/Mouth: EACs patent / TMs  nl. Nares clear without erythema, swelling, mucoid exudates. Oral hygiene is good. No erythema, swelling, or exudate. Tongue normal, non-obstructing. Tonsils not swollen or erythematous. Hearing normal.  Neck: Supple, thyroid not palpable. No bruits, nodes or JVD. Respiratory: Respiratory effort normal.  BS equal and clear bilateral without rales, rhonci, wheezing or stridor. Cardio: Heart sounds are normal with regular rate and rhythm and no murmurs, rubs or gallops. Peripheral pulses are normal and equal bilaterally without edema. No aortic or femoral bruits. Chest: symmetric with normal excursions and percussion. Breasts: Symmetric, without lumps, nipple discharge, retractions, or fibrocystic changes.  Abdomen: Flat, soft with bowel sounds active. Nontender, no guarding, rebound, hernias, masses, or  organomegaly.  Lymphatics: Non tender without lymphadenopathy.  Musculoskeletal: Full ROM all peripheral extremities, joint stability, 5/5 strength, and normal gait. Skin: Warm and dry without rashes, lesions, cyanosis, clubbing or  ecchymosis.  Neuro: Cranial nerves intact, reflexes equal bilaterally. Normal muscle tone, no cerebellar symptoms. Sensation intact.  Pysch: Alert and oriented X 3, normal affect, Insight and Judgment appropriate.   Assessment and Plan  1. Annual Preventative Screening Examination  2. Essential hypertension  - CBC with Differential/Platelet - COMPLETE METABOLIC PANEL WITH GFR - Magnesium - TSH - EKG 12-Lead - Urinalysis, Routine w reflex microscopic - Microalbumin / creatinine urine ratio  3. Hyperlipidemia, mixed  - Lipid panel - TSH - EKG 12-Lead  4. Abnormal glucose  - Hemoglobin A1c - Insulin, random  5. Vitamin D deficiency  - VITAMIN D 25 Hydroxyl  6. Myeloproliferative disorder (HCC)  - CBC with Differential/Platelet  7. Osteoarthritis   8. Osteoporosis   9. 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 Screening for colorectal cancer  - POC Hemoccult Bld/Stl  11. Screening for ischemic heart disease  - Lipid panel  12. FHx: heart disease  - Lipid panel  13. Medication management  - CBC with Differential/Platelet - COMPLETE METABOLIC PANEL WITH GFR - Magnesium - Lipid panel - TSH - Hemoglobin A1c - Insulin, random - VITAMIN D 25 Hydroxy - Urinalysis, Routine w reflex microscopic - Microalbumin / creatinine urine ratio         Patient was counseled in prudent diet to achieve/maintain BMI less than 25 for weight control, BP monitoring, regular exercise and medications. Discussed med's effects and SE's. Screening labs and tests as requested with regular follow-up as recommended. Over 40 minutes of exam, counseling, chart review and high complex critical decision making was performed.   Kirtland Bouchard, MD

## 2019-01-27 NOTE — Patient Instructions (Signed)

## 2019-01-28 LAB — LIPID PANEL
Cholesterol: 162 mg/dL (ref <200)
HDL: 62 mg/dL (ref >=50)
LDL Cholesterol (Calc): 77 mg/dL (calc)
Non-HDL Cholesterol (Calc): 100 mg/dL (calc) (ref <130)
Total CHOL/HDL Ratio: 2.6 (calc) (ref <5.0)
Triglycerides: 134 mg/dL (ref <150)

## 2019-01-28 LAB — URINALYSIS, ROUTINE W REFLEX MICROSCOPIC
Bacteria, UA: NONE SEEN /HPF
Bilirubin Urine: NEGATIVE
Glucose, UA: NEGATIVE
Hgb urine dipstick: NEGATIVE
Hyaline Cast: NONE SEEN /LPF
Ketones, ur: NEGATIVE
Nitrite: NEGATIVE
Protein, ur: NEGATIVE
RBC / HPF: NONE SEEN /HPF (ref 0–2)
Specific Gravity, Urine: 1.01 (ref 1.001–1.03)
pH: <=5 (ref 5.0–8.0)

## 2019-01-28 LAB — HEMOGLOBIN A1C
Hgb A1c MFr Bld: 5.3 % of total Hgb (ref <5.7)
Mean Plasma Glucose: 105 (calc)
eAG (mmol/L): 5.8 (calc)

## 2019-01-28 LAB — COMPLETE METABOLIC PANEL WITH GFR
AG Ratio: 1.5 (calc) (ref 1.0–2.5)
ALT: 18 U/L (ref 6–29)
AST: 23 U/L (ref 10–35)
Albumin: 4.2 g/dL (ref 3.6–5.1)
Alkaline phosphatase (APISO): 77 U/L (ref 37–153)
BUN: 21 mg/dL (ref 7–25)
CO2: 27 mmol/L (ref 20–32)
Calcium: 10 mg/dL (ref 8.6–10.4)
Chloride: 104 mmol/L (ref 98–110)
Creat: 0.88 mg/dL (ref 0.60–0.93)
GFR, Est African American: 73 mL/min/{1.73_m2} (ref >=60)
GFR, Est Non African American: 63 mL/min/{1.73_m2} (ref >=60)
Globulin: 2.8 g/dL (calc) (ref 1.9–3.7)
Glucose, Bld: 85 mg/dL (ref 65–99)
Potassium: 4.3 mmol/L (ref 3.5–5.3)
Sodium: 143 mmol/L (ref 135–146)
Total Bilirubin: 0.7 mg/dL (ref 0.2–1.2)
Total Protein: 7 g/dL (ref 6.1–8.1)

## 2019-01-28 LAB — MICROALBUMIN / CREATININE URINE RATIO
Creatinine, Urine: 35 mg/dL (ref 20–275)
Microalb Creat Ratio: 17 mcg/mg creat (ref <30)
Microalb, Ur: 0.6 mg/dL

## 2019-01-28 LAB — CBC WITH DIFFERENTIAL/PLATELET
Absolute Monocytes: 542 cells/uL (ref 200–950)
Basophils Absolute: 82 cells/uL (ref 0–200)
Basophils Relative: 1.3 %
Eosinophils Absolute: 290 cells/uL (ref 15–500)
Eosinophils Relative: 4.6 %
HCT: 45.1 % — ABNORMAL HIGH (ref 35.0–45.0)
Hemoglobin: 15.1 g/dL (ref 11.7–15.5)
Lymphs Abs: 1972 cells/uL (ref 850–3900)
MCH: 31.9 pg (ref 27.0–33.0)
MCHC: 33.5 g/dL (ref 32.0–36.0)
MCV: 95.1 fL (ref 80.0–100.0)
MPV: 10.5 fL (ref 7.5–12.5)
Monocytes Relative: 8.6 %
Neutro Abs: 3415 cells/uL (ref 1500–7800)
Neutrophils Relative %: 54.2 %
Platelets: 304 10*3/uL (ref 140–400)
RBC: 4.74 10*6/uL (ref 3.80–5.10)
RDW: 13.1 % (ref 11.0–15.0)
Total Lymphocyte: 31.3 %
WBC: 6.3 10*3/uL (ref 3.8–10.8)

## 2019-01-28 LAB — VITAMIN D 25 HYDROXY (VIT D DEFICIENCY, FRACTURES): Vit D, 25-Hydroxy: 78 ng/mL (ref 30–100)

## 2019-01-28 LAB — TSH: TSH: 1.94 mIU/L (ref 0.40–4.50)

## 2019-01-28 LAB — INSULIN, RANDOM: Insulin: 13 u[IU]/mL

## 2019-01-28 LAB — MAGNESIUM: Magnesium: 1.9 mg/dL (ref 1.5–2.5)

## 2019-02-21 ENCOUNTER — Telehealth: Payer: Self-pay | Admitting: Hematology and Oncology

## 2019-02-21 NOTE — Telephone Encounter (Signed)
I left a message per 7/24 sch msg regarding reschedule

## 2019-02-24 ENCOUNTER — Telehealth: Payer: Self-pay | Admitting: Hematology and Oncology

## 2019-02-24 NOTE — Telephone Encounter (Signed)
Patient called to reschedule  °

## 2019-02-25 ENCOUNTER — Inpatient Hospital Stay: Payer: Medicare Other | Admitting: Hematology and Oncology

## 2019-02-25 ENCOUNTER — Inpatient Hospital Stay: Payer: Medicare Other

## 2019-02-27 DIAGNOSIS — Z20828 Contact with and (suspected) exposure to other viral communicable diseases: Secondary | ICD-10-CM | POA: Diagnosis not present

## 2019-03-04 ENCOUNTER — Ambulatory Visit: Payer: Medicare Other | Admitting: Hematology and Oncology

## 2019-03-04 ENCOUNTER — Other Ambulatory Visit: Payer: Medicare Other

## 2019-03-10 ENCOUNTER — Other Ambulatory Visit: Payer: Self-pay | Admitting: *Deleted

## 2019-03-10 MED ORDER — FUROSEMIDE 40 MG PO TABS: ORAL_TABLET | ORAL | 1 refills | Status: DC

## 2019-03-11 ENCOUNTER — Other Ambulatory Visit: Payer: Self-pay

## 2019-03-11 DIAGNOSIS — Z1212 Encounter for screening for malignant neoplasm of rectum: Secondary | ICD-10-CM | POA: Diagnosis not present

## 2019-03-11 DIAGNOSIS — Z1211 Encounter for screening for malignant neoplasm of colon: Secondary | ICD-10-CM

## 2019-03-11 LAB — POC HEMOCCULT BLD/STL (HOME/3-CARD/SCREEN)
Card #2 Fecal Occult Blod, POC: NEGATIVE
Card #3 Fecal Occult Blood, POC: NEGATIVE
Fecal Occult Blood, POC: NEGATIVE

## 2019-03-28 ENCOUNTER — Telehealth: Payer: Self-pay

## 2019-03-28 NOTE — Telephone Encounter (Signed)
She called and left a message to cancel 9/1 appts. Appointments canceled.

## 2019-03-31 ENCOUNTER — Telehealth: Payer: Self-pay

## 2019-03-31 NOTE — Telephone Encounter (Signed)
Pt cancelled her appts for tomorrow.  States she is going out of town for 2 months.   Appt's rescheduled for 06/10/19

## 2019-04-01 ENCOUNTER — Inpatient Hospital Stay: Payer: Medicare Other

## 2019-04-01 ENCOUNTER — Inpatient Hospital Stay: Payer: Medicare Other | Admitting: Hematology and Oncology

## 2019-05-28 ENCOUNTER — Telehealth: Payer: Self-pay | Admitting: Hematology and Oncology

## 2019-05-28 NOTE — Telephone Encounter (Signed)
Called pt per 10/28 sch message - unable to reach pt . Left message for patient for pt to call back to r/s

## 2019-06-04 ENCOUNTER — Ambulatory Visit: Payer: Self-pay | Admitting: Physician Assistant

## 2019-06-10 ENCOUNTER — Inpatient Hospital Stay: Payer: Medicare Other

## 2019-06-10 ENCOUNTER — Inpatient Hospital Stay: Payer: Medicare Other | Admitting: Hematology and Oncology

## 2019-06-28 ENCOUNTER — Other Ambulatory Visit: Payer: Self-pay | Admitting: Adult Health Nurse Practitioner

## 2019-06-28 DIAGNOSIS — I10 Essential (primary) hypertension: Secondary | ICD-10-CM

## 2019-07-03 LAB — HM DEXA SCAN

## 2019-07-03 LAB — HM MAMMOGRAPHY

## 2019-07-14 ENCOUNTER — Encounter: Payer: Self-pay | Admitting: Internal Medicine

## 2019-08-11 ENCOUNTER — Telehealth: Payer: Self-pay

## 2019-08-11 NOTE — Telephone Encounter (Signed)
Called and given below message. She verbalized understanding. 

## 2019-08-11 NOTE — Telephone Encounter (Signed)
She called and left a message. Is it okay to get the COVID vaccine?

## 2019-08-11 NOTE — Telephone Encounter (Signed)
It is generally not recommended now for patients with blood cancers getting chemo like her as it is not likely to be effective so I do not recommend it now for her until we have more information in a few months

## 2019-09-08 ENCOUNTER — Ambulatory Visit: Payer: Medicare Other | Admitting: Internal Medicine

## 2019-09-13 ENCOUNTER — Ambulatory Visit: Payer: Medicare PPO | Attending: Internal Medicine

## 2019-09-13 DIAGNOSIS — Z23 Encounter for immunization: Secondary | ICD-10-CM | POA: Insufficient documentation

## 2019-09-13 NOTE — Progress Notes (Signed)
   Covid-19 Vaccination Clinic  Name:  Kimberly Watson    MRN: [id number] DOB: March 06, 1941  09/13/2019  Ms. Kimberly Watson was observed post Covid-19 immunization for 15 minutes without incidence. She was provided with Vaccine Information Sheet and instruction to access the V-Safe system.   Ms. Kimberly Watson was instructed to call 911 with any severe reactions post vaccine: Marland Kitchen Difficulty breathing  . Swelling of your face and throat  . A fast heartbeat  . A bad rash all over your body  . Dizziness and weakness    Immunizations Administered    Name Date Dose VIS Date Route   Pfizer COVID-19 Vaccine 09/13/2019  2:31 PM 0.3 mL 07/11/2019 Intramuscular   Manufacturer: [place]   Lot: [id number]   [place]: [id number]

## 2019-09-17 ENCOUNTER — Other Ambulatory Visit: Payer: Self-pay

## 2019-09-17 ENCOUNTER — Ambulatory Visit: Payer: Medicare PPO | Admitting: Internal Medicine

## 2019-09-17 VITALS — BP 132/84 | HR 68 | Temp 96.6°F | Resp 16 | Ht 63.0 in | Wt 175.0 lb

## 2019-09-17 DIAGNOSIS — E782 Mixed hyperlipidemia: Secondary | ICD-10-CM

## 2019-09-17 DIAGNOSIS — R7309 Other abnormal glucose: Secondary | ICD-10-CM

## 2019-09-17 DIAGNOSIS — Z7901 Long term (current) use of anticoagulants: Secondary | ICD-10-CM

## 2019-09-17 DIAGNOSIS — Z79899 Other long term (current) drug therapy: Secondary | ICD-10-CM

## 2019-09-17 DIAGNOSIS — I10 Essential (primary) hypertension: Secondary | ICD-10-CM | POA: Diagnosis not present

## 2019-09-17 DIAGNOSIS — D471 Chronic myeloproliferative disease: Secondary | ICD-10-CM

## 2019-09-17 DIAGNOSIS — E559 Vitamin D deficiency, unspecified: Secondary | ICD-10-CM

## 2019-09-17 MED ORDER — APIXABAN 2.5 MG PO TABS: ORAL_TABLET | ORAL | 3 refills | Status: DC

## 2019-09-17 NOTE — Progress Notes (Signed)
History of Present Illness:       This very nice 79 y.o. DWF presents for 3 month follow up with HTN, HLD, Pre-Diabetes and Vitamin D Deficiency.       Patient has hx/o Osteoporosis and had intolerance in the past to Fosamax with GERD and had been treated with a couple of doses of Reclast sometime prior to 2011.  Also , therapy with Evista was aborted due to DVT as below. Most recent dexa BMD in December 2020 showed worsening Osteoporosis compared to 2018 and 2016.       In Jan 2017, patient was dx'd with DVT & a saddle PE consequent of Evista therapy and was started on Eliquis. Patient has been  dx'd with a MDS being treated with Hydrea and is  followed by Dr Alvy Bimler.       Patient has hx/o HTN (1980's)  & BP has been controlled at home. Today's BP is at goal - 132/84. Patient has had no complaints of any cardiac type chest pain, palpitations, dyspnea / orthopnea / PND, dizziness, claudication, or dependent edema.      Hyperlipidemia is controlled with diet & meds. Patient denies myalgias or other med SE's. Last Lipids were at goal:  Lab Results  Component Value Date   CHOL 162 01/27/2019   HDL 62 01/27/2019   LDLCALC 77 01/27/2019   TRIG 134 01/27/2019   CHOLHDL 2.6 01/27/2019    Also, the patient is followed expectantly for glucose intolerance  and has had no symptoms of reactive hypoglycemia, diabetic polys, paresthesias or visual blurring.  Last A1c was Normal & at goal:  Lab Results  Component Value Date   HGBA1C 5.3 01/27/2019       Further, the patient also has history of Vitamin D Deficiency and supplements vitamin D without any suspected side-effects. Last vitamin D was at goal:  Lab Results  Component Value Date   VD25OH 78 01/27/2019    Current Outpatient Medications on File Prior to Visit  Medication Sig  . amoxicillin (AMOXIL) 500 MG capsule Take 4 capsules 2 hours before Dental Procedure  . calcium carbonate (OS-CAL) 600 MG TABS tablet Take 600 mg by  mouth in the morning and at bedtime.   . Cholecalciferol (VITAMIN D PO) Take 5,000 Units by mouth 3 (three) times daily.   . furosemide (LASIX) 40 MG tablet Take 1 tablet daily for blood pressure and fluid.  . hydroxyurea (HYDREA) 500 MG capsule Take 1 capsule (500 mg total) by mouth daily. May take with food to minimize GI side effects.  Marland Kitchen MAGNESIUM PO Take 500 mg by mouth 2 (two) times daily.   . metoprolol succinate (TOPROL-XL) 100 MG 24 hr tablet Take 1 tablet Daily for BP  . OVER THE COUNTER MEDICATION Take 1 tablet by mouth daily as needed (allergies). OTC allergy pill  . polyvinyl alcohol (LIQUIFILM TEARS) 1.4 % ophthalmic solution Place 1 drop into both eyes as needed for dry eyes.  . sodium chloride (OCEAN) 0.65 % SOLN nasal spray Place 1 spray into both nostrils as needed for congestion.   No current facility-administered medications on file prior to visit.    Allergies  Allergen Reactions  . Evista [Raloxifene] Other (See Comments)    Blood clots - DVT and Pulmonary Embolism   . Fosamax [Alendronate Sodium]     esophagitis    PMHx:   Past Medical History:  Diagnosis Date  . Cataract   . Clotting disorder (  [place])   . DVT of lower extremity (deep venous thrombosis) ([place])    left, resolved  . Elevated hemoglobin A1c   . GERD (gastroesophageal reflux disease)   . H/O left knee surgery   . Hypertension   . JAK2 gene mutation   . Leiomyoma    adnexal Leiomyoma removal in 1/11  . Myeloproliferative disorder ([place])    ? P vera  . Nephrolithiasis   . Osteoporosis   . PE (pulmonary embolism) 08/2015    Immunization History  Administered Date(s) Administered  . DT (Pediatric) 03/30/2015  . Influenza, High Dose Seasonal PF 05/02/2017, 05/20/2018  . PFIZER SARS-COV-2 Vaccination 09/13/2019  . Pneumococcal Conjugate-13 03/25/2014  . Tdap 12/27/2018  . Zoster 12/27/2005    Past Surgical History:  Procedure Laterality Date  . CHOLECYSTECTOMY    . cyst removed off spine     . IVC FILTER PLACEMENT ([facility] HX)  08/2015  . JOINT REPLACEMENT Left total knee  . TONSILLECTOMY    . UTERINE FIBROID SURGERY      FHx:    Reviewed / unchanged  SHx:    Reviewed / unchanged   Systems Review:  Constitutional: Denies fever, chills, wt changes, headaches, insomnia, fatigue, night sweats, change in appetite. Eyes: Denies redness, blurred vision, diplopia, discharge, itchy, watery eyes.  ENT: Denies discharge, congestion, post nasal drip, epistaxis, sore throat, earache, hearing loss, dental pain, tinnitus, vertigo, sinus pain, snoring.  CV: Denies chest pain, palpitations, irregular heartbeat, syncope, dyspnea, diaphoresis, orthopnea, PND, claudication or edema. Respiratory: denies cough, dyspnea, DOE, pleurisy, hoarseness, laryngitis, wheezing.  Gastrointestinal: Denies dysphagia, odynophagia, heartburn, reflux, water brash, abdominal pain or cramps, nausea, vomiting, bloating, diarrhea, constipation, hematemesis, melena, hematochezia  or hemorrhoids. Genitourinary: Denies dysuria, frequency, urgency, nocturia, hesitancy, discharge, hematuria or flank pain. Musculoskeletal: Denies arthralgias, myalgias, stiffness, jt. swelling, pain, limping or strain/sprain.  Skin: Denies pruritus, rash, hives, warts, acne, eczema or change in skin lesion(s). Neuro: No weakness, tremor, incoordination, spasms, paresthesia or pain. Psychiatric: Denies confusion, memory loss or sensory loss. Endo: Denies change in weight, skin or hair change.  Heme/Lymph: No excessive bleeding, bruising or enlarged lymph nodes.  Physical Exam  BP 132/84   Pulse 68   Temp (!) 96.6 F (35.9 C)   Resp 16   Ht 5\' 3"  (1.6 m)   Wt 175 lb (79.4 kg)   BMI 31.00 kg/m   Appears  well nourished, well groomed  and in no distress.  Eyes: PERRLA, EOMs, conjunctiva no swelling or erythema. Sinuses: No frontal/maxillary tenderness ENT/Mouth: EAC's clear, TM's nl w/o erythema, bulging. Nares clear w/o erythema,  swelling, exudates. Oropharynx clear without erythema or exudates. Oral hygiene is good. Tongue normal, non obstructing. Hearing intact.  Neck: Supple. Thyroid not palpable. Car 2+/2+ without bruits, nodes or JVD. Chest: Respirations nl with BS clear & equal w/o rales, rhonchi, wheezing or stridor.  Cor: Heart sounds normal w/ regular rate and rhythm without sig. murmurs, gallops, clicks or rubs. Peripheral pulses normal and equal  without edema.  Abdomen: Soft & bowel sounds normal. Non-tender w/o guarding, rebound, hernias, masses or organomegaly.  Lymphatics: Unremarkable.  Musculoskeletal: Full ROM all peripheral extremities, joint stability, 5/5 strength and normal gait.  Skin: Warm, dry without exposed rashes, lesions or ecchymosis apparent.  Neuro: Cranial nerves intact, reflexes equal bilaterally. Sensory-motor testing grossly intact. Tendon reflexes grossly intact.  Pysch: Alert & oriented x 3.  Insight and judgement nl & appropriate. No ideations.  Assessment and Plan:  1. Essential hypertension  -  Continue medication, monitor blood pressure at home.  - Continue DASH diet.  Reminder to go to the ER if any CP,  SOB, nausea, dizziness, severe HA, changes vision/speech.  - CBC with Differential/Platelet - COMPLETE METABOLIC PANEL WITH GFR - Magnesium - TSH  2. Hyperlipidemia, mixed  - Continue diet/meds, exercise,& lifestyle modifications.  - Continue monitor periodic cholesterol/liver & renal functions   - Lipid panel - TSH  3. Abnormal glucose  - Continue diet, exercise  - Lifestyle modifications.  - Monitor appropriate labs.  - Hemoglobin A1c - Insulin, random  4. Vitamin D deficiency  - Continue supplementation.  - VITAMIN D 25 Hydroxy  5. Myeloproliferative disorder (HCC)  - COMPLETE METABOLIC PANEL WITH GFR  6. Chronic anticoagulation   7. Medication management  - CBC with Differential/Platelet - COMPLETE METABOLIC PANEL WITH GFR - Magnesium -  Lipid panel - TSH - Hemoglobin A1c - Insulin, random - VITAMIN D 25 Hydroxy         Discussed  regular exercise, BP monitoring, weight control to achieve/maintain BMI less than 25 and discussed med and SE's. Recommended labs to assess and monitor clinical status with further disposition pending results of labs.  I discussed the assessment and treatment plan with the patient. The patient was provided an opportunity to ask questions and all were answered. The patient agreed with the plan and demonstrated an understanding of the instructions.  I provided over 30 minutes of exam, counseling, chart review and  complex critical decision making.   Kirtland Bouchard, MD

## 2019-09-17 NOTE — Patient Instructions (Signed)
- Link to sign up at [employer] site for the Covid-19 vaccine   [email or web address].[email or web address]   Or call [phone]  ++++++++++++++++++++++++++++++++++++  Vit D  & Vit C 1,000 mg   are recommended to help protect  against the Covid-19 and other Corona viruses.    Also it's recommended  to take  Zinc 50 mg  to help  protect against the Covid-19   and best place to get  is also on [employer].com  and don't pay more than 6-8 cents /pill !   ===================================== Coronavirus (COVID-19) Are you at risk?  Are you at risk for the Coronavirus (COVID-19)?  To be considered HIGH RISK for Coronavirus (COVID-19), you have to meet the following criteria:  . Traveled to [place], [place], [place], [place] or [place]; or in the [place] to [place], [place], [place]  . or [place]; and have fever, cough, and shortness of breath within the last 2 weeks of travel OR . Been in close contact with a person diagnosed with COVID-19 within the last 2 weeks and have  . fever, cough,and shortness of breath .  . IF YOU DO NOT MEET THESE CRITERIA, YOU ARE CONSIDERED LOW RISK FOR COVID-19.  What to do if you are HIGH RISK for COVID-19?  Marland Kitchen If you are having a medical emergency, call 911. . Seek medical care right away. Before you go to a doctor's office, urgent care or emergency department, .  call ahead and tell them about your recent travel, contact with someone diagnosed with COVID-19  .  and your symptoms.  . You should receive instructions from your physician's office regarding next steps of care.  . When you arrive at healthcare provider, tell the healthcare staff immediately you have returned from  . visiting [place], [place], [place], [place] or [place]; or traveled in the [place] to [place], [place],  . [place] or [place] in the  last two weeks or you have been in close contact with a person diagnosed with  . COVID-19 in the last 2 weeks.   . Tell the health care staff about your symptoms: fever, cough and shortness of breath. . After you have been seen by a medical provider, you will be either: o Tested for (COVID-19) and discharged home on quarantine except to seek medical care if  o symptoms worsen, and asked to  - Stay home and avoid contact with others until you get your results (4-5 days)  - Avoid travel on public transportation if possible (such as bus, train, or airplane) or o Sent to the Emergency Department by EMS for evaluation, COVID-19 testing  and  o possible admission depending on your condition and test results.  What to do if you are LOW RISK for COVID-19?  Reduce your risk of any infection by using the same precautions used for avoiding the common cold or flu:  Marland Kitchen Wash your hands often with soap and warm water for at least 20 seconds.  If soap and water are not readily available,  . use an alcohol-based hand sanitizer with at least 60% alcohol.  . If coughing or sneezing, cover your mouth and nose by coughing or sneezing into the elbow areas of your shirt or coat, .  into a tissue or into your sleeve (not your hands). . Avoid shaking hands with others and consider head nods or verbal greetings only. . Avoid touching your eyes, nose, or mouth with unwashed  hands.  . Avoid close contact with people who are sick. . Avoid places or events with large numbers of people in one location, like concerts or sporting events. . Carefully consider travel plans you have or are making. . If you are planning any travel outside or inside the [place], visit the CDC's Travelers' Health webpage for the latest health notices. . If you have some symptoms but not all symptoms, continue to monitor at home and seek medical attention  . if your symptoms worsen. . If you are having a medical emergency, call  911.   ++++++++++++++++++++++++++++++++ Recommend Adult Low Dose Aspirin or  coated  Aspirin 81 mg daily  To reduce risk of Colon Cancer 40 %,  Skin Cancer 26 % ,  Melanoma 46%  and  Pancreatic cancer 60% ++++++++++++++++++++++++++++++++ Vitamin D goal  is between 70-100.  Please make sure that you are taking your Vitamin D as directed.  It is very important as a natural anti-inflammatory  helping hair, skin, and nails, as well as reducing stroke and heart attack risk.  It helps your bones and helps with mood. It also decreases numerous cancer risks so please take it as directed.  Low Vit D is associated with a 200-300% higher risk for CANCER  and 200-300% higher risk for HEART   ATTACK  &  STROKE.   ...................................... It is also associated with higher death rate at younger ages,  autoimmune diseases like Rheumatoid arthritis, Lupus, Multiple Sclerosis.    Also many other serious conditions, like depression, Alzheimer's Dementia, infertility, muscle aches, fatigue, fibromyalgia - just to name a few. ++++++++++++++++++++ Recommend the book "The END of DIETING" by Dr Joel Fuhrman  & the book "The END of DIABETES " by Dr Joel Fuhrman At [employer].com - get book & Audio CD's    Being diabetic has a  300% increased risk for heart attack, stroke, cancer, and alzheimer- type vascular dementia. It is very important that you work harder with diet by avoiding all foods that are white. Avoid white rice (brown & wild rice is OK), white potatoes (sweetpotatoes in moderation is OK), White bread or wheat bread or anything made out of white flour like bagels, donuts, rolls, buns, biscuits, cakes, pastries, cookies, pizza crust, and pasta (made from white flour & egg whites) - vegetarian pasta or spinach or wheat pasta is OK. Multigrain breads like Arnold's or Pepperidge Farm, or multigrain sandwich thins or flatbreads.  Diet, exercise and weight loss can reverse and cure diabetes in  the early stages.  Diet, exercise and weight loss is very important in the control and prevention of complications of diabetes which affects every system in your body, ie. Brain - dementia/stroke, eyes - glaucoma/blindness, heart - heart attack/heart failure, kidneys - dialysis, stomach - gastric paralysis, intestines - malabsorption, nerves - severe painful neuritis, circulation - gangrene & loss of a leg(s), and finally cancer and Alzheimers.    I recommend avoid fried & greasy foods,  sweets/candy, white rice (brown or wild rice or Quinoa is OK), white potatoes (sweet potatoes are OK) - anything made from white flour - bagels, doughnuts, rolls, buns, biscuits,white and wheat breads, pizza crust and traditional pasta made of white flour & egg white(vegetarian pasta or spinach or wheat pasta is OK).  Multi-grain bread is OK - like multi-grain flat bread or sandwich thins. Avoid alcohol in excess. Exercise is also important.    Eat all the vegetables you want - avoid meat, especially red meat and   dairy - especially cheese.  Cheese is the most concentrated form of trans-fats which is the worst thing to clog up our arteries. Veggie cheese is OK which can be found in the fresh produce section at [employer] or [employer] or [employer]  +++++++++++++++++++++ DASH Eating Plan  DASH stands for "Dietary Approaches to Stop Hypertension."   The DASH eating plan is a healthy eating plan that has been shown to reduce high blood pressure (hypertension). Additional health benefits may include reducing the risk of type 2 diabetes mellitus, heart disease, and stroke. The DASH eating plan may also help with weight loss. WHAT DO I NEED TO KNOW ABOUT THE DASH EATING PLAN? For the DASH eating plan, you will follow these general guidelines:  Choose foods with a percent daily value for sodium of less than 5% (as listed on the food label).  Use salt-free seasonings or herbs instead of table salt or sea salt.  Check  with your health care provider or pharmacist before using salt substitutes.  Eat lower-sodium products, often labeled as "lower sodium" or "no salt added."  Eat fresh foods.  Eat more vegetables, fruits, and low-fat dairy products.  Choose whole grains. Look for the word "whole" as the first word in the ingredient list.  Choose fish   Limit sweets, desserts, sugars, and sugary drinks.  Choose heart-healthy fats.  Eat veggie cheese   Eat more home-cooked food and less restaurant, buffet, and fast food.  Limit fried foods.  Cook foods using methods other than frying.  Limit canned vegetables. If you do use them, rinse them well to decrease the sodium.  When eating at a restaurant, ask that your food be prepared with less salt, or no salt if possible.                      WHAT FOODS CAN I EAT? Read Dr Joel Fuhrman's books on The End of Dieting & The End of Diabetes  Grains Whole grain or whole wheat bread. Brown rice. Whole grain or whole wheat pasta. Quinoa, bulgur, and whole grain cereals. Low-sodium cereals. Corn or whole wheat flour tortillas. Whole grain cornbread. Whole grain crackers. Low-sodium crackers.  Vegetables Fresh or frozen vegetables (raw, steamed, roasted, or grilled). Low-sodium or reduced-sodium tomato and vegetable juices. Low-sodium or reduced-sodium tomato sauce and paste. Low-sodium or reduced-sodium canned vegetables.   Fruits All fresh, canned (in natural juice), or frozen fruits.  Protein Products  All fish and seafood.  Dried beans, peas, or lentils. Unsalted nuts and seeds. Unsalted canned beans.  Dairy Low-fat dairy products, such as skim or 1% milk, 2% or reduced-fat cheeses, low-fat ricotta or cottage cheese, or plain low-fat yogurt. Low-sodium or reduced-sodium cheeses.  Fats and Oils Tub margarines without trans fats. Light or reduced-fat mayonnaise and salad dressings (reduced sodium). Avocado. Safflower, olive, or canola oils. Natural  peanut or almond butter.  Other Unsalted popcorn and pretzels. The items listed above may not be a complete list of recommended foods or beverages. Contact your dietitian for more options.  +++++++++++++++  WHAT FOODS ARE NOT RECOMMENDED? Grains/ White flour or wheat flour White bread. White pasta. White rice. Refined cornbread. Bagels and croissants. Crackers that contain trans fat.  Vegetables  Creamed or fried vegetables. Vegetables in a . Regular canned vegetables. Regular canned tomato sauce and paste. Regular tomato and vegetable juices.  Fruits Dried fruits. Canned fruit in light or heavy syrup. Fruit juice.  Meat and Other Protein Products Meat   in general - RED meat & White meat.  Fatty cuts of meat. Ribs, chicken wings, all processed meats as bacon, sausage, bologna, salami, fatback, hot dogs, bratwurst and packaged luncheon meats.  Dairy Whole or 2% milk, cream, half-and-half, and cream cheese. Whole-fat or sweetened yogurt. Full-fat cheeses or blue cheese. Non-dairy creamers and whipped toppings. Processed cheese, cheese spreads, or cheese curds.  Condiments Onion and garlic salt, seasoned salt, table salt, and sea salt. Canned and packaged gravies. Worcestershire sauce. Tartar sauce. Barbecue sauce. Teriyaki sauce. Soy sauce, including reduced sodium. Steak sauce. Fish sauce. Oyster sauce. Cocktail sauce. Horseradish. Ketchup and mustard. Meat flavorings and tenderizers. Bouillon cubes. Hot sauce. Tabasco sauce. Marinades. Taco seasonings. Relishes.  Fats and Oils Butter, stick margarine, lard, shortening and bacon fat. Coconut, palm kernel, or palm oils. Regular salad dressings.  Pickles and olives. Salted popcorn and pretzels.  The items listed above may not be a complete list of foods and beverages to avoid.   Bleeding Precautions When on Anticoagulant Therapy, Adult Anticoagulant therapy, also called blood thinner therapy, is medicine that helps to prevent and  treat blood clots. The medicine works by stopping blood clots from forming or growing. Blood clots that form in your blood vessels can be dangerous. They can break loose and travel to the heart, lungs, or brain. This increases the risk of a heart attack, stroke, or blocked lung artery (pulmonary embolism). Anticoagulants also increase the risk of bleeding. Try to protect yourself from cuts and other injuries that can cause bleeding. It is important to take anticoagulants exactly as told by your health care provider. Why do I need to be on anticoagulant therapy? You may need this medicine if you are at risk of developing a blood clot. Conditions that increase your risk of a blood clot include:  Being born with heart disease or a heart malformation (congenital heart disease).  Developing heart disease.  Having had surgery, such as valve replacement.  Having had a serious accident or other type of severe injury (trauma).  Having certain types of cancer.  Having certain diseases that can increase blood clotting.  Having a high risk of stroke or heart attack.  Having atrial fibrillation (AF). What are the common anticoagulant medicines? There are several types of anticoagulant medicines. The most common types are:  Medicines that you take by mouth (oral medicines), such as: ? Warfarin. ? Novel oral anticoagulants (NOACs), such as:  Direct thrombin inhibitors (dabigatran).  Factor Xa inhibitors (apixaban, edoxaban, and rivaroxaban).  Injections, such as: ? Unfractionated heparin. ? Low molecular weight heparin. These anticoagulants work in different ways to prevent blood clots. They also have different risks and side effects. What do I need to remember while on anticoagulant therapy? Taking anticoagulants  Take your medicine at the same time every day. If you forget to take your medicine, take it as soon as you remember. Do not double your dosage of medicine if you miss a whole day.  Take your normal dose and call your health care provider.  Do not stop taking your medicine unless your health care provider approves. Stopping the medicine can increase your risk of developing a blood clot. Taking other medicines  Take over-the-counter and prescriptions medicines only as told by your health care provider.  Do not take over-the-counter NSAIDs, including aspirin and ibuprofen, while you are on anticoagulant therapy. These medicines increase your risk of dangerous bleeding.  Get approval from your health care provider before you start taking any new medicines,  vitamins, or herbal products. Some of these could interfere with your therapy. General instructions  Keep all follow-up visits as told by your health care provider. This is important.  If you are pregnant or trying to get pregnant, talk with a health care provider about anticoagulants. Some of these medicines are not safe to take during pregnancy.  Tell all health care providers, including your dentist, that you are on anticoagulant therapy. It is especially important to tell providers before you have any surgery, medical procedures, or dental work done. What precautions should I take?   Be very careful when using knives, scissors, or other sharp objects.  Use an electric razor instead of a blade.  Do not use toothpicks.  Use a soft-bristled toothbrush. Brush your teeth gently.  Always wear shoes outdoors and wear slippers indoors.  Be careful when cutting your fingernails and toenails.  Place bath mats in the bathroom. If possible, install handrails as well.  Wear gloves while you do yard work.  Wear your seat belt.  Prevent falls by removing loose rugs and extension cords from areas where you walk. Use a cane or walker if you need it.  Avoid constipation by: ? Drinking enough fluid to keep your urine clear or pale yellow. ? Eating foods that are high in fiber, such as fresh fruits and vegetables, whole  grains, and beans. ? Limiting foods that are high in fat and processed sugars, such as fried and sweet foods.  Do not play contact sports or participate in other activities that have a high risk for injury. What other precautions are important if on warfarin therapy? If you are taking a type of anticoagulant called warfarin, make sure you:  Work with a diet and nutrition specialist (dietitian) to make an eating plan. Do not make any sudden changes to your diet after you have started your eating plan.  Do not drink alcohol. It can interfere with your medicine and increase your risk of an injury that causes bleeding.  Get regular blood tests as told by your health care provider. What are some questions to ask my health care provider?  Why do I need anticoagulant therapy?  What is the best anticoagulant therapy for my condition?  How long will I need anticoagulant therapy?  What are the side effects of anticoagulant therapy?  When should I take my medicine? What should I do if I forget to take it?  Will I need to have regular blood tests?  Do I need to change my diet? Are there foods or drinks that I should avoid?  What activities are safe for me?  What should I do if I want to get pregnant? Contact a health care provider if:  You miss a dose of medicine: ? And you are not sure what to do. ? For more than one day.  You have: ? Menstrual bleeding that is heavier than normal. ? Bloody or brown urine. ? Easy bruising. ? Black and tarry stool or bright red stool. ? Side effects from your medicine.  You feel weak or dizzy.  You become pregnant. Get help right away if:  You have bleeding that will not stop within 20 minutes from: ? The nose. ? The gums. ? A cut on the skin.  You have a severe headache or stomachache.  You vomit or cough up blood.  You fall or hit your head. Summary  Anticoagulant therapy, also called blood thinner therapy, is medicine that helps to  prevent and  treat blood clots.  Anticoagulants work in different ways to prevent blood clots. They also have different risks and side effects.  Talk with your health care provider about any precautions that you should take while on anticoagulant therapy. This information is not intended to replace advice given to you by your health care provider. Make sure you discuss any questions you have with your health care provider. Document Revised: 11/06/2018 Document Reviewed: 10/03/2016 Elsevier Patient Education  [place].

## 2019-09-18 ENCOUNTER — Other Ambulatory Visit: Payer: Self-pay | Admitting: Internal Medicine

## 2019-09-18 ENCOUNTER — Encounter: Payer: Self-pay | Admitting: Internal Medicine

## 2019-09-18 DIAGNOSIS — R7309 Other abnormal glucose: Secondary | ICD-10-CM | POA: Insufficient documentation

## 2019-09-18 DIAGNOSIS — M810 Age-related osteoporosis without current pathological fracture: Secondary | ICD-10-CM

## 2019-09-18 LAB — CBC WITH DIFFERENTIAL/PLATELET
Absolute Monocytes: 710 cells/uL (ref 200–950)
Basophils Absolute: 102 cells/uL (ref 0–200)
Basophils Relative: 1.6 %
Eosinophils Absolute: 262 cells/uL (ref 15–500)
Eosinophils Relative: 4.1 %
HCT: 45.1 % — ABNORMAL HIGH (ref 35.0–45.0)
Hemoglobin: 15.3 g/dL (ref 11.7–15.5)
Lymphs Abs: 1875 cells/uL (ref 850–3900)
MCH: 31.9 pg (ref 27.0–33.0)
MCHC: 33.9 g/dL (ref 32.0–36.0)
MCV: 94 fL (ref 80.0–100.0)
MPV: 10.7 fL (ref 7.5–12.5)
Monocytes Relative: 11.1 %
Neutro Abs: 3450 cells/uL (ref 1500–7800)
Neutrophils Relative %: 53.9 %
Platelets: 336 10*3/uL (ref 140–400)
RBC: 4.8 10*6/uL (ref 3.80–5.10)
RDW: 13.3 % (ref 11.0–15.0)
Total Lymphocyte: 29.3 %
WBC: 6.4 10*3/uL (ref 3.8–10.8)

## 2019-09-18 LAB — COMPLETE METABOLIC PANEL WITH GFR
AG Ratio: 1.5 (calc) (ref 1.0–2.5)
ALT: 17 U/L (ref 6–29)
AST: 20 U/L (ref 10–35)
Albumin: 4 g/dL (ref 3.6–5.1)
Alkaline phosphatase (APISO): 72 U/L (ref 37–153)
BUN/Creatinine Ratio: 17 (calc) (ref 6–22)
BUN: 16 mg/dL (ref 7–25)
CO2: 29 mmol/L (ref 20–32)
Calcium: 10.1 mg/dL (ref 8.6–10.4)
Chloride: 102 mmol/L (ref 98–110)
Creat: 0.94 mg/dL — ABNORMAL HIGH (ref 0.60–0.93)
GFR, Est African American: 67 mL/min/{1.73_m2} (ref >=60)
GFR, Est Non African American: 58 mL/min/{1.73_m2} — ABNORMAL LOW (ref >=60)
Globulin: 2.7 g/dL (calc) (ref 1.9–3.7)
Glucose, Bld: 92 mg/dL (ref 65–99)
Potassium: 3.6 mmol/L (ref 3.5–5.3)
Sodium: 143 mmol/L (ref 135–146)
Total Bilirubin: 0.9 mg/dL (ref 0.2–1.2)
Total Protein: 6.7 g/dL (ref 6.1–8.1)

## 2019-09-18 LAB — LIPID PANEL
Cholesterol: 151 mg/dL (ref <200)
HDL: 53 mg/dL (ref >=50)
LDL Cholesterol (Calc): 76 mg/dL (calc)
Non-HDL Cholesterol (Calc): 98 mg/dL (calc) (ref <130)
Total CHOL/HDL Ratio: 2.8 (calc) (ref <5.0)
Triglycerides: 135 mg/dL (ref <150)

## 2019-09-18 LAB — MAGNESIUM: Magnesium: 1.9 mg/dL (ref 1.5–2.5)

## 2019-09-18 LAB — HEMOGLOBIN A1C
Hgb A1c MFr Bld: 5.4 % of total Hgb (ref <5.7)
Mean Plasma Glucose: 108 (calc)
eAG (mmol/L): 6 (calc)

## 2019-09-18 LAB — INSULIN, RANDOM: Insulin: 14.9 u[IU]/mL

## 2019-09-18 LAB — VITAMIN D 25 HYDROXY (VIT D DEFICIENCY, FRACTURES): Vit D, 25-Hydroxy: 90 ng/mL (ref 30–100)

## 2019-09-18 LAB — TSH: TSH: 1.16 mIU/L (ref 0.40–4.50)

## 2019-09-24 ENCOUNTER — Other Ambulatory Visit: Payer: Self-pay | Admitting: Hematology and Oncology

## 2019-09-24 DIAGNOSIS — Z7901 Long term (current) use of anticoagulants: Secondary | ICD-10-CM

## 2019-09-25 ENCOUNTER — Telehealth: Payer: Self-pay | Admitting: Hematology and Oncology

## 2019-09-25 NOTE — Telephone Encounter (Signed)
Scheduled appt per 2/25 sch message - pt is aware of appt date and time   

## 2019-09-29 ENCOUNTER — Other Ambulatory Visit: Payer: Self-pay | Admitting: Hematology and Oncology

## 2019-09-29 DIAGNOSIS — Z7901 Long term (current) use of anticoagulants: Secondary | ICD-10-CM

## 2019-10-02 ENCOUNTER — Inpatient Hospital Stay: Payer: Medicare PPO

## 2019-10-02 ENCOUNTER — Inpatient Hospital Stay: Payer: Medicare PPO | Attending: Hematology and Oncology | Admitting: Hematology and Oncology

## 2019-10-02 ENCOUNTER — Encounter: Payer: Self-pay | Admitting: Hematology and Oncology

## 2019-10-02 ENCOUNTER — Other Ambulatory Visit: Payer: Self-pay

## 2019-10-02 DIAGNOSIS — Z7901 Long term (current) use of anticoagulants: Secondary | ICD-10-CM | POA: Diagnosis not present

## 2019-10-02 DIAGNOSIS — Z803 Family history of malignant neoplasm of breast: Secondary | ICD-10-CM | POA: Diagnosis not present

## 2019-10-02 DIAGNOSIS — H538 Other visual disturbances: Secondary | ICD-10-CM | POA: Diagnosis not present

## 2019-10-02 DIAGNOSIS — H547 Unspecified visual loss: Secondary | ICD-10-CM | POA: Diagnosis not present

## 2019-10-02 DIAGNOSIS — M1711 Unilateral primary osteoarthritis, right knee: Secondary | ICD-10-CM

## 2019-10-02 DIAGNOSIS — Z86718 Personal history of other venous thrombosis and embolism: Secondary | ICD-10-CM | POA: Insufficient documentation

## 2019-10-02 DIAGNOSIS — D471 Chronic myeloproliferative disease: Secondary | ICD-10-CM

## 2019-10-02 DIAGNOSIS — Z79899 Other long term (current) drug therapy: Secondary | ICD-10-CM | POA: Diagnosis not present

## 2019-10-02 DIAGNOSIS — C946 Myelodysplastic disease, not classified: Secondary | ICD-10-CM | POA: Diagnosis not present

## 2019-10-02 DIAGNOSIS — Z86711 Personal history of pulmonary embolism: Secondary | ICD-10-CM | POA: Insufficient documentation

## 2019-10-02 MED ORDER — APIXABAN 2.5 MG PO TABS: ORAL_TABLET | ORAL | 3 refills | Status: DC

## 2019-10-02 NOTE — Assessment & Plan Note (Signed)
She has visual difficulties secondary to cataracts There is no contraindication for her to proceed with cataract surgery if needed

## 2019-10-02 NOTE — Progress Notes (Signed)
[facility] OFFICE PROGRESS NOTE  Patient Care Team: Unk Pinto, MD as PCP - General (Internal Medicine) Monna Fam, MD as Consulting Physician (Ophthalmology) Gatha Mayer, MD as Consulting Physician (Gastroenterology) Kathie Rhodes, MD as Consulting Physician (Urology) Meisinger, Sherren Mocha, MD as Consulting Physician (Obstetrics and Gynecology)  ASSESSMENT & PLAN:  Myeloproliferative disorder Kimberly Watson) She tolerated hydroxyurea very well Since she has frequent blood count monitoring through her primary care doctor, I plan to see her again in December. The plan will be to continue indefinitely.  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 So far, she has no bleeding complications I refill her prescriptions today  Osteoarthritis of right knee She has bilateral arthritis in her knee and desires knee replacement surgery She will call me if she plans to proceed with knee surgery this year as I would need to see her back for perioperative anticoagulation management and recheck blood count to make sure that she does not develop severe anemia after surgery  Poor vision She has visual difficulties secondary to cataracts There is no contraindication for her to proceed with cataract surgery if needed   Orders Placed This Encounter  Procedures  . CBC with Differential/Platelet    Standing Status:   Future    Standing Expiration Date:   11/05/2020  . Comprehensive metabolic panel    Standing Status:   Future    Standing Expiration Date:   11/05/2020    All questions were answered. The patient knows to call the clinic with any problems, questions or concerns. The total time spent in the appointment was 20 minutes encounter with  patients including review of chart and various tests results, discussions about plan of care and coordination of care plan   [name], MD 10/02/2019 12:12 PM  INTERVAL HISTORY: Please see below for problem oriented charting. She was lost to follow-up due to recent scheduling difficulties She left to be with her son's family for 3 months in [place] She is doing well with anticoagulation therapy without bleeding complications She is taking hydroxyurea as prescribed No recent infection, fever or chills She have concerns about osteoporosis According to recent report, her T score is very low.  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 I reviewed her records extensively and collaborated the history with the patient. The patient has been taking Evista for over 25 years due to strong family history of breast cancer and for osteoporosis management. In January 2017, she had viral gastroenteritis with severe diarrhea and dehydration. The patient has been taking Lasix chronically for mild bilateral lower extremity edema, left greater than the right. 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 She recalled having profuse diarrhea nonstop for 24 hours leading up to the blood clot event. Of note, prior to the diagnosis, she was not taking aspirin consistently  Prior to that she denies recent  history of trauma, long distance travel, recent surgery, smoking or prolonged immobilization. She had no prior history or diagnosis of cancer. Her age appropriate screening programs  are up-to-date. She had prior surgeries before and never had perioperative thromboembolic events. The patient had been exposed to birth control pills and never had thrombotic events. The patient had been pregnant before and denies history of peripartum thromboembolic event or history of recurrent miscarriages. There is no family history of blood clots or miscarriages.  On 08/18/2015, she presented to the emergency department due to symptoms of severe weakness, chest pressure, shortness of breath and near syncopal episode. She had extensive evaluation in the emergency room. D-dimer was elevated at greater than 20.  CT angiogram dated 08/19/2015 show massive saddle pulmonary embolus with evidence of severe right heart strain.  Left lower extremity venous Doppler on 08/19/2015 showed findings consistent with acute deep vein thrombosis involving the left femoral vein, left popliteal vein, left posterior tibial vein and left peroneal vein.  Echocardiogram on 08/19/2015 show preserved ejection fraction but with right ventricular dilatation, moderate tricuspid valvular regurgitation, oscillating density on TV(thought to be blood clot in transit) and moderate to severe increased right systolic pressure/evidence of severe pulmonary hypertension  The patient was admitted with IV heparin anticoagulation therapy and placement of IVC filter on 08/20/2015.  Echocardiogram was repeated on 08/23/2015 which showed persistent elevated pulmonary hypertension. The mass over the right tricuspid valve is no longer visible She was hospitalized on 08/18/2015 to 08/24/2015. She was discharged home on Eliquis.  Evista was discontinued. 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 On 09/17/2017, she started taking hydroxyurea 500 mg daily  REVIEW OF SYSTEMS:   Constitutional: Denies fevers, chills or abnormal weight loss Eyes: Denies blurriness of vision Ears, nose, mouth, throat, and face: Denies mucositis or sore throat Respiratory: Denies cough, dyspnea or wheezes Cardiovascular: Denies palpitation, chest discomfort or lower extremity swelling Gastrointestinal:  Denies nausea, heartburn or change in bowel habits Skin: Denies abnormal skin rashes Lymphatics: Denies new lymphadenopathy or easy bruising Neurological:Denies numbness, tingling or new weaknesses Behavioral/Psych: Mood is stable, no new changes  All other systems were reviewed with the patient and are negative.  I have reviewed the past medical history, past surgical history, social history and family history with the patient and they are unchanged from previous note.  ALLERGIES:  is allergic to evista [raloxifene] and fosamax [alendronate sodium].  MEDICATIONS:  Current Outpatient Medications  Medication Sig Dispense Refill  . amoxicillin (AMOXIL) 500 MG capsule Take 4 capsules 2 hours before Dental Procedure 12 capsule 1  . apixaban (ELIQUIS) 2.5 MG TABS tablet Take 1 tablet 2 x  /day to prevent Blood clots 180 tablet 3  . calcium carbonate (OS-CAL) 600 MG TABS tablet Take 600 mg by mouth in the morning and at bedtime.     . Cholecalciferol (VITAMIN D PO) Take 5,000 Units by mouth 3 (three) times daily.     . furosemide (LASIX) 40 MG tablet Take 1 tablet daily for blood pressure and fluid. 90 tablet 1  . hydroxyurea (HYDREA) 500 MG capsule Take 1 capsule (500 mg total) by mouth daily. May take with food to minimize GI side effects. 90 capsule 3  . MAGNESIUM PO Take 500 mg by mouth 2 (two) times daily.     Marland Kitchen  metoprolol succinate (TOPROL-XL) 100 MG 24 hr tablet Take 1 tablet Daily for BP 90 tablet 3  . OVER THE COUNTER MEDICATION Take 1 tablet by mouth daily as needed (allergies). OTC  allergy pill    . polyvinyl alcohol (LIQUIFILM TEARS) 1.4 % ophthalmic solution Place 1 drop into both eyes as needed for dry eyes.    . sodium chloride (OCEAN) 0.65 % SOLN nasal spray Place 1 spray into both nostrils as needed for congestion.     No current facility-administered medications for this visit.    PHYSICAL EXAMINATION: ECOG PERFORMANCE STATUS: 1 - Symptomatic but completely ambulatory  Vitals:   10/02/19 1034  BP: 133/86  Pulse: 72  Resp: 18  Temp: 98 F (36.7 C)  SpO2: 97%   Filed Weights   10/02/19 1034  Weight: 175 lb 12.8 oz (79.7 kg)    GENERAL:alert, no distress and comfortable Musculoskeletal:no cyanosis of digits and no clubbing  NEURO: alert & oriented x 3 with fluent speech, no focal motor/sensory deficits  LABORATORY DATA:  I have reviewed the data as listed    Component Value Date/Time   NA 143 09/17/2019 1629   K 3.6 09/17/2019 1629   CL 102 09/17/2019 1629   CO2 29 09/17/2019 1629   GLUCOSE 92 09/17/2019 1629   BUN 16 09/17/2019 1629   CREATININE 0.94 (H) 09/17/2019 1629   CALCIUM 10.1 09/17/2019 1629   PROT 6.7 09/17/2019 1629   ALBUMIN 3.9 09/17/2018 1135   AST 20 09/17/2019 1629   ALT 17 09/17/2019 1629   ALKPHOS 77 09/17/2018 1135   BILITOT 0.9 09/17/2019 1629   GFRNONAA 58 (L) 09/17/2019 1629   GFRAA 67 09/17/2019 1629    No results found for: SPEP, UPEP  Lab Results  Component Value Date   WBC 6.4 09/17/2019   NEUTROABS 3,450 09/17/2019   HGB 15.3 09/17/2019   HCT 45.1 (H) 09/17/2019   MCV 94.0 09/17/2019   PLT 336 09/17/2019      Chemistry      Component Value Date/Time   NA 143 09/17/2019 1629   K 3.6 09/17/2019 1629   CL 102 09/17/2019 1629   CO2 29 09/17/2019 1629   BUN 16 09/17/2019 1629   CREATININE 0.94 (H) 09/17/2019 1629      Component Value Date/Time   CALCIUM 10.1 09/17/2019 1629   ALKPHOS 77 09/17/2018 1135   AST 20 09/17/2019 1629   ALT 17 09/17/2019 1629   BILITOT 0.9 09/17/2019 1629

## 2019-10-02 NOTE — Assessment & Plan Note (Signed)
She has bilateral arthritis in her knee and desires knee replacement surgery She will call me if she plans to proceed with knee surgery this year as I would need to see her back for perioperative anticoagulation management and recheck blood count to make sure that she does not develop severe anemia after surgery

## 2019-10-02 NOTE — Assessment & Plan Note (Signed)
She tolerated hydroxyurea very well Since she has frequent blood count monitoring through her primary care doctor, I plan to see her again in December. The plan will be to continue indefinitely.  She is warned to call me if she feels ill with infection, stress or planning to have surgery

## 2019-10-02 NOTE — Assessment & Plan Note (Signed)
She had history of recurrent thrombosis She is doing well on chronic anticoagulation therapy I recommend she continue the same and indefinitely. So far, she has no bleeding complications I refill her prescriptions today

## 2019-10-03 ENCOUNTER — Telehealth: Payer: Self-pay | Admitting: Hematology and Oncology

## 2019-10-03 NOTE — Telephone Encounter (Signed)
Scheduled appt per 3/4 sch msg. Pt aware of new appt date and time

## 2019-10-06 ENCOUNTER — Ambulatory Visit: Payer: Medicare PPO | Attending: Internal Medicine

## 2019-10-06 DIAGNOSIS — Z23 Encounter for immunization: Secondary | ICD-10-CM

## 2019-10-06 NOTE — Progress Notes (Signed)
   Covid-19 Vaccination Clinic  Name:  Kimberly Watson    MRN: [id number] DOB: 1941/06/02  10/06/2019  Ms. Kimberly Watson was observed post Covid-19 immunization for 15 minutes without incident. She was provided with Vaccine Information Sheet and instruction to access the V-Safe system.   Ms. Kimberly Watson was instructed to call 911 with any severe reactions post vaccine: Marland Kitchen Difficulty breathing  . Swelling of face and throat  . A fast heartbeat  . A bad rash all over body  . Dizziness and weakness   Immunizations Administered    Name Date Dose VIS Date Route   Pfizer COVID-19 Vaccine 10/06/2019  1:09 PM 0.3 mL 07/11/2019 Intramuscular   Manufacturer: [place]   Lot: [id number]   [place]: [id number]

## 2019-10-27 ENCOUNTER — Ambulatory Visit: Payer: Medicare PPO | Admitting: Internal Medicine

## 2019-10-27 ENCOUNTER — Encounter: Payer: Self-pay | Admitting: Internal Medicine

## 2019-10-27 ENCOUNTER — Other Ambulatory Visit: Payer: Self-pay

## 2019-10-27 VITALS — BP 138/80 | HR 68 | Ht 62.25 in | Wt 174.0 lb

## 2019-10-27 DIAGNOSIS — E559 Vitamin D deficiency, unspecified: Secondary | ICD-10-CM | POA: Diagnosis not present

## 2019-10-27 DIAGNOSIS — M810 Age-related osteoporosis without current pathological fracture: Secondary | ICD-10-CM

## 2019-10-27 NOTE — Progress Notes (Signed)
Patient ID: Kimberly Watson, female   DOB: 09-20-1940, 79 y.o.   MRN: [id number]   This visit occurred during the SARS-CoV-2 public health emergency.  Safety protocols were in place, including screening questions prior to the visit, additional usage of staff PPE, and extensive cleaning of exam room while observing appropriate contact time as indicated for disinfecting solutions.   HPI  Kimberly Watson is a 79 y.o.-year-old female, referred by her PCP, Dr. Melford Watson, for management of osteoporosis (OP).  Pt was dx with OP in 1990s -in her late 37s.  I reviewed pt's DXA scan reports: Date L1-L4 T score FN T score 33% distal Radius Ultra distal radius  07/03/2019 ([facility]) n/a RFN: -3.4 LFN: -2.2 -3.5 n/a  05/16/2017 ([facility]) n/a RFN: -2.9 LFN: -2.0 -3.1 n/a  04/09/2013 ([facility]) n/a RFN: -2.2 LFN: -1.8 n/a n/a   Date L1-L4 T score FN T score 33% distal Radius Ultra distal radius  04/27/2015 ([facility]) n/a RFN: -2.0 LFN: -2.2 -2.1 -4.4   She denies fractures.  No falls except tripped and fell 10/2018 >> no fractures.  She needs another knee replacement (R). She had steroid 1x inj. in 2019 in R knee.  No dizziness/vertigo/orthostasis/poor vision. She is scheduled for cataract Sx.  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 Reviewed available vit D levels: Lab Results  Component Value Date   VD25OH 90 09/17/2019   VD25OH 78 01/27/2019   VD25OH 94 11/14/2017   VD25OH 46 12/22/2016   VD25OH 81 01/19/2016   VD25OH 46 07/13/2015   VD25OH 43 03/30/2015   Pt is on: - calcium carbonate 600 mg 2x a day - vitamin D 15,000 daily (5000 units 3x a day)  No weight bearing exercises now, but was exercises at the gym before the Covid pandemic.  She and her sister are exercising: chair yoga, floor exercises, handweights/pushups.  She does not take high vitamin A doses.  Menopause was at 79 y/o.   FH of osteoporosis: father in his 92s, sisters with osteopenia.  No h/o hyper/hypocalcemia or hyperparathyroidism. No h/o kidney stones. Lab Results  Component Value Date   CALCIUM 10.1 09/17/2019   CALCIUM 10.0 01/27/2019   CALCIUM 9.7 09/17/2018   CALCIUM 10.4 05/20/2018   CALCIUM 10.0 02/13/2018   CALCIUM 9.6 11/14/2017   CALCIUM 9.5 09/25/2017   CALCIUM 8.8 (L) 08/03/2017   CALCIUM 9.7 06/06/2017   CALCIUM 10.1 05/02/2017   No h/o thyrotoxicosis. Reviewed TSH recent levels:  Lab Results  Component Value Date   TSH 1.16 09/17/2019   TSH 1.94 01/27/2019   TSH 1.23 05/20/2018   TSH 1.97 02/13/2018   TSH 1.53 05/02/2017   No h/o CKD. Last BUN/Cr: Lab Results  Component Value Date   BUN 16 09/17/2019   CREATININE 0.94 (H) 09/17/2019    ROS: Constitutional: no weight gain, no weight loss, no fatigue, no subjective hyperthermia, no subjective hypothermia, no nocturia Eyes: no blurry vision, no xerophthalmia ENT: no sore throat, no nodules palpated in neck, no dysphagia, no odynophagia, no hoarseness, no tinnitus, + hypoacusis Cardiovascular: no CP, no SOB, no palpitations, no leg swelling Respiratory: no cough, no SOB, no wheezing Gastrointestinal: no N, no V, no D, no C, no  acid reflux Musculoskeletal: no muscle, + joint aches Skin: no rash, no hair loss Neurological: no tremors, no numbness or tingling/no dizziness/no HAs Psychiatric: no depression, no anxiety  I reviewed pt's medications, allergies, PMH, social hx, family hx, and changes were documented in the history of present illness. Otherwise, unchanged from my initial visit note.  Past Medical History:  Diagnosis Date  . Cataract   . Clotting disorder ([place])   . DVT of lower extremity (deep venous thrombosis) ([place] [facility])    left, resolved  . Elevated  hemoglobin A1c   . GERD (gastroesophageal reflux disease)   . H/O left knee surgery   . Hypertension   . JAK2 gene mutation   . Leiomyoma    adnexal Leiomyoma removal in 1/11  . Myeloproliferative disorder ([place])    ? P vera  . Nephrolithiasis   . Osteoporosis   . PE (pulmonary embolism) 08/2015   Past Surgical History:  Procedure Laterality Date  . CHOLECYSTECTOMY    . cyst removed off spine    . IVC FILTER PLACEMENT ([facility] H[facility])  08/2015  . JOINT REPLACEMENT Left total knee  . TONSILLECTOMY    . UTERINE FIBROID SURGERY     Social History   Socioeconomic History  . Marital status: Divorced    Spouse name: Not on file  . Number of children: 2  . Years of education: Not on file  . Highest education level: Not on file  Occupational History  . Occupation: Pharmacist, hospital    Comment: Retired  Tobacco Use  . Smoking status: Never Smoker  . Smokeless tobacco: Never Used  Substance and Sexual Activity  . Alcohol use: No  . Drug use: No  . Sexual activity: Not on file    Comment: retired, next of kin, 1 son and daughter.  Other Topics Concern  . Not on file  Social History Narrative   Divorced, 2 children lives alone.  Lived with a smoker for years.    Retired Radio producer   No alcohol tobacco or drug use   10/08/2017      Social Determinants of Health   Financial Resource Strain:   . Difficulty of Paying Living Expenses:   Food Insecurity:   . Worried About Charity fundraiser in the Last Year:   . Arboriculturist in the Last Year:   Transportation Needs:   . Film/video editor (Medical):   Marland Kitchen Lack of Transportation (Non-Medical):   Physical Activity:   . Days of Exercise per Week:   . Minutes of Exercise per Session:   Stress:   . Feeling of Stress :   Social Connections:   . Frequency of Communication with Friends and Family:   . Frequency of Social Gatherings with Friends and Family:   . Attends Religious Services:   . Active Member of Clubs or Organizations:    . Attends Archivist Meetings:   Marland Kitchen Marital Status:   Intimate Partner Violence:   . Fear of Current or Ex-Partner:   . Emotionally Abused:   Marland Kitchen Physically Abused:   . Sexually Abused:    Current Outpatient Medications on File Prior to Visit  Medication Sig Dispense Refill  . amoxicillin (AMOXIL) 500 MG capsule Take 4 capsules 2 hours before Dental Procedure 12 capsule 1  . apixaban (ELIQUIS) 2.5 MG TABS tablet Take 1 tablet 2 x  /day to prevent Blood clots 180 tablet 3  . calcium carbonate (OS-CAL) 600 MG TABS tablet Take 600 mg  by mouth in the morning and at bedtime.     . Cholecalciferol (VITAMIN D PO) Take 5,000 Units by mouth 3 (three) times daily.     . furosemide (LASIX) 40 MG tablet Take 1 tablet daily for blood pressure and fluid. 90 tablet 1  . hydroxyurea (HYDREA) 500 MG capsule Take 1 capsule (500 mg total) by mouth daily. May take with food to minimize GI side effects. 90 capsule 3  . MAGNESIUM PO Take 500 mg by mouth 2 (two) times daily.     . metoprolol succinate (TOPROL-XL) 100 MG 24 hr tablet Take 1 tablet Daily for BP 90 tablet 3  . OVER THE COUNTER MEDICATION Take 1 tablet by mouth daily as needed (allergies). OTC allergy pill    . polyvinyl alcohol (LIQUIFILM TEARS) 1.4 % ophthalmic solution Place 1 drop into both eyes as needed for dry eyes.    . sodium chloride (OCEAN) 0.65 % SOLN nasal spray Place 1 spray into both nostrils as needed for congestion.     No current facility-administered medications on file prior to visit.   Allergies  Allergen Reactions  . Evista [Raloxifene] Other (See Comments)    Blood clots - DVT and Pulmonary Embolism   . Fosamax [Alendronate Sodium]     esophagitis   Family History  Problem Relation Age of Onset  . Hypertension Mother   . Diabetes Mother   . Breast cancer Mother   . Congestive Heart Failure Mother        Died age 59  . Osteoporosis Father   . Colon cancer Neg Hx   . Colon polyps Neg Hx   . Esophageal  cancer Neg Hx   . Rectal cancer Neg Hx   . Stomach cancer Neg Hx    PE: BP 138/80   Pulse 68   Ht 5' 2.25" (1.581 m) Comment: measured today without shoes  Wt 174 lb (78.9 kg)   SpO2 98%   BMI 31.57 kg/m  Wt Readings from Last 3 Encounters:  10/27/19 174 lb (78.9 kg)  10/02/19 175 lb 12.8 oz (79.7 kg)  09/17/19 175 lb (79.4 kg)   Constitutional: overweight, in NAD.  + Scoliosis and kyphosis. Eyes: PERRLA, EOMI, no exophthalmos ENT: moist mucous membranes, no thyromegaly, no cervical lymphadenopathy Cardiovascular: RRR, No MRG Respiratory: CTA B Gastrointestinal: abdomen soft, NT, ND, BS+ Musculoskeletal: no deformities except as above, strength intact in all 4 Skin: moist, warm, no rashes Neurological: no tremor with outstretched hands, DTR normal in all 4  Assessment: 1. Osteoporosis  Plan: 1. Osteoporosis - likely postmenopausal/age-related, she has FH of OP - Discussed about increased risk of fracture, depending on the T score, greatly increased when the T score is lower than -2.5, but it is actually a continuum and -2.5 should not be regarded as an absolute threshold. We reviewed her latest 4 DXA scan reports and images together, and I explained that based on the T scores, she has an increased risk for fractures.   - we reviewed her dietary and supplemental calcium and vitamin D intake. I recommended to make sure she gets 1000-1200 mg of calcium daily preferentially from diet.  For now, I will advise her to decrease the calcium to only 1 tablet of 600 mg daily but she may need to stop this if she feels that she gets enough calcium from the diet.  Regarding vitamin D, she had a recent level in the 90s on 15000 units vitamin D daily.  I advised  her to decrease the dose to only 10,000 units of vitamin D daily and will recheck her vitamin D in 2 months. - discussed fall precautions   - given handout from [facility] Re: weight bearing exercises - advised to do  this every day or at least 5/7 days - we discussed about maintaining a good amount of protein in her diet. The recommended daily protein intake is ~0.8 g per kilogram per day. I advised her to try to aim for this amount, since a diet low in proteins can exacerbate osteoporosis. Also, avoid smoking or >2 drinks of alcohol a day. - I recommended low acid eating, low in animal proteins - We discussed about the different medication classes, benefits and side effects (including atypical fractures and ONJ - no dental workup in progress or planned).  - I explained that first options are sq denosumab (Prolia) sq every 6 months for 6-10 years and zoledronic acid (Reclast) iv 1x a year for 1-2 years (She tells me that she had a high calcium from Reclast, but this would be very unusual, since bisphosphonates may lower, not raise calcium). I would use Teriparatide/Abaloparatide (which are daily subcu medication) or Romosozumab (monthly) as a last resort.  Patient is interested in [facility].  - Pt was given reading information about Prolia, and I explained the mechanism of action and expected benefits.  - Iwill arrange for a Prolia inj - will check a new DXA scan in 2 years after starting Prolia -  I explained that the first indication that the treatment is working is her not having anymore fractures. DEXA scan changes are secondary: unchanged or slightly higher T-scores are desirable - will see pt back in a year  Philemon Kingdom, MD PhD [facility] Endocrinology

## 2019-10-27 NOTE — Patient Instructions (Addendum)
Try to reduce the calcium supplementt to 600 mg daily, but if you can get 1200 mg from the diet, you can stop it.  Try to reduce vitamin D supplement to 10,000 units daily.  Please come back for a vitamin D check in 2 months.  We will call you with Prolia schedule.  Please come back for a follow-up appointment in 1 year.  How Can I Prevent Falls? Men and women with osteoporosis need to take care not to fall down. Falls can break bones. Some reasons people fall are: Poor vision  Poor balance  Certain diseases that affect how you walk  Some types of medicine, such as sleeping pills.  Some tips to help prevent falls outdoors are: Use a cane or walker  Wear rubber-soled shoes so you don't slip  Walk on grass when sidewalks are slippery  In winter, put salt or kitty litter on icy sidewalks.  Some ways to help prevent falls indoors are: Keep rooms free of clutter, especially on floors  Use plastic or carpet runners on slippery floors  Wear low-heeled shoes that provide good support  Do not walk in socks, stockings, or slippers  Be sure carpets and area rugs have skid-proof backs or are tacked to the floor  Be sure stairs are well lit and have rails on both sides  Put grab bars on bathroom walls near tub, shower, and toilet  Use a rubber bath mat in the shower or tub  Keep a flashlight next to your bed  Use a sturdy step stool with a handrail and wide steps  Add more lights in rooms (and night lights) Buy a cordless phone to keep with you so that you don't have to rush to the phone       when it rings and so that you can call for help if you fall.   (adapted from http://www.niams.NightlifePreviews.se)  Exercise for Strong Bones (from [place]) There are two types of exercises that are important for building and maintaining bone density:  weight-bearing and muscle-strengthening exercises. Weight-bearing Exercises These  exercises include activities that make you move against gravity while staying upright. Weight-bearing exercises can be high-impact or low-impact. High-impact weight-bearing exercises help build bones and keep them strong. If you have broken a bone due to osteoporosis or are at risk of breaking a bone, you may need to avoid high-impact exercises. If youre not sure, you should check with your healthcare provider. Examples of high-impact weight-bearing exercises are:  Dancing  Doing high-impact aerobics  Hiking  Jogging/running  Jumping Rope  Stair climbing  Tennis Low-impact weight-bearing exercises can also help keep bones strong and are a safe alternative if you cannot do high-impact exercises. Examples of low-impact weight-bearing exercises are:  Using elliptical training machines  Doing low-impact aerobics  Using stair-step machines  Fast walking on a treadmill or outside Muscle-Strengthening Exercises These exercises include activities where you move your body, a weight or some other resistance against gravity. They are also known as resistance exercises and include:  Lifting weights  Using elastic exercise bands  Using weight machines  Lifting your own body weight  Functional movements, such as standing and rising up on your toes Yoga and Pilates can also improve strength, balance and flexibility. However, certain positions may not be safe for people with osteoporosis or those at increased risk of broken bones. For example, exercises that have you bend forward may increase the chance of breaking a bone in the spine. A physical  therapist should be able to help you learn which exercises are safe and appropriate for you. Non-Impact Exercises Non-impact exercises can help you to improve balance, posture and how well you move in everyday activities. These exercises can also help to increase muscle strength and decrease the risk of falls and broken bones. Some of these  exercises include:  Balance exercises that strengthen your legs and test your balance, such as Tai Chi, can decrease your risk of falls.  Posture exercises that improve your posture and reduce rounded or sloping shoulders can help you decrease the chance of breaking a bone, especially in the spine.  Functional exercises that improve how well you move can help you with everyday activities and decrease your chance of falling and breaking a bone. For example, if you have trouble getting up from a chair or climbing stairs, you should do these activities as exercises. A physical therapist can teach you balance, posture and functional exercises. Starting a New Exercise Program If you havent exercised regularly for a while, check with your healthcare provider before beginning a new exercise program--particularly if you have health problems such as heart disease, diabetes or high blood pressure. If youre at high risk of breaking a bone, you should work with a physical therapist to develop a safe exercise program. Once you have your healthcare providers approval, start slowly. If youve already broken bones in the spine because of osteoporosis, be very careful to avoid activities that require reaching down, bending forward, rapid twisting motions, heavy lifting and those that increase your chance of a fall. As you get started, your muscles may feel sore for a day or two after you exercise. If soreness lasts longer, you may be working too hard and need to ease up. Exercises should be done in a pain-free range of motion. How Much Exercise Do You Need? Weight-bearing exercises 30 minutes on most days of the week. Do a 30-minutesession or multiple sessions spread out throughout the day. The benefits to your bones are the same.   Muscle-strengthening exercises Two to three days per week. If you dont have much time for strengthening/resistance training, do small amounts at a time. You can do just one body part  each day. For example do arms one day, legs the next and trunk the next. You can also spread these exercises out during your normal day.  Balance, posture and functional exercises Every day or as often as needed. You may want to focus on one area more than the others. If you have fallen or lose your balance, spend time doing balance exercises. If you are getting rounded shoulders, work more on posture exercises. If you have trouble climbing stairs or getting up from the couch, do more functional exercises. You can also perform these exercises at one time or spread them during your day. Work with a phyiscal therapist to learn the right exercises for you.    Denosumab: Patient drug information (Up-to-date) Copyright [phone] [place] rights reserved.  Brand Names: [place]  ProliaDelton See What is this drug used for?  It is used to treat soft, brittle bones (osteoporosis).  It is used for bone growth.  It is used when treating some cancers.  It may be given to you for other reasons. Talk with the doctor. What do I need to tell my doctor BEFORE I take this drug?  All products:  If you have an allergy to denosumab or any other part of this drug.  If you  are allergic to any drugs like this one, any other drugs, foods, or other substances. Tell your doctor about the allergy and what signs you had, like rash; hives; itching; shortness of breath; wheezing; cough; swelling of face, lips, tongue, or throat; or any other signs.  If you have low calcium levels.  Prolia:  If you are pregnant or may be pregnant. Do not take this drug if you are pregnant.  This is not a list of all drugs or health problems that interact with this drug.  Tell your doctor and pharmacist about all of your drugs (prescription or OTC, natural products, vitamins) and health problems. You must check to make sure that it is safe for you to take this drug with all of your drugs and health problems. Do not start, stop,  or change the dose of any drug without checking with your doctor. What are some things I need to know or do while I take this drug?  All products:  Tell dentists, surgeons, and other doctors that you use this drug.  This drug may raise the chance of a broken leg. Talk with your doctor.  Have your blood work checked. Talk with your doctor.  Have a bone density test. Talk with your doctor.  Take calcium and vitamin D as you were told by your doctor.  Have a dental exam before starting this drug.  Take good care of your teeth. See a dentist often.  If you smoke, talk with your doctor.  Do not give to a child. Talk with your doctor.  Tell your doctor if you are breast-feeding. You will need to talk about any risks to your baby.  Delton See:  This drug may cause harm to the unborn baby if you take it while you are pregnant. If you get pregnant while taking this drug, call your doctor right away.  Prolia:  Very bad infections have been reported with use of this drug. If you have any infection, are taking antibiotics now or in the recent past, or have many infections, talk with your doctor.  You may have more chance of getting an infection. Wash hands often. Stay away from people with infections, colds, or flu.  Use birth control that you can trust to prevent pregnancy while taking this drug.  If you are a man and your sex partner is pregnant or gets pregnant at any time while you are being treated, talk with your doctor. What are some side effects that I need to call my doctor about right away?  WARNING/CAUTION: Even though it may be rare, some people may have very bad and sometimes deadly side effects when taking a drug. Tell your doctor or get medical help right away if you have any of the following signs or symptoms that may be related to a very bad side effect:  All products:  Signs of an allergic reaction, like rash; hives; itching; red, swollen, blistered, or peeling skin with or  without fever; wheezing; tightness in the chest or throat; trouble breathing or talking; unusual hoarseness; or swelling of the mouth, face, lips, tongue, or throat.  Signs of low calcium levels like muscle cramps or spasms, numbness and tingling, or seizures.  Mouth sores.  Any new or strange groin, hip, or thigh pain.  This drug may cause jawbone problems. The chance may be higher the longer you take this drug. The chance may be higher if you have cancer, dental problems, dentures that do not fit well, anemia, blood  clotting problems, or an infection. The chance may also be higher if you are having dental work or if you are getting chemo, some steroid drugs, or radiation. Call your doctor right away if you have jaw swelling or pain.  Xgeva:  Not hungry.  Muscle pain or weakness.  Seizures.  Shortness of breath.  Prolia:  Signs of infection. These include a fever of 100.68F (38C) or higher, chills, very bad sore throat, ear or sinus pain, cough, more sputum or change in color of sputum, pain with passing urine, mouth sores, wound that will not heal, or anal itching or pain.  Signs of a pancreas problem (pancreatitis) like very bad stomach pain, very bad back pain, or very bad upset stomach or throwing up.  Chest pain.  A heartbeat that does not feel normal.  Very bad skin irritation.  Feeling very tired or weak.  Bladder pain or pain when passing urine or change in how much urine is passed.  Passing urine often.  Swelling in the arms or legs. What are some other side effects of this drug?  All drugs may cause side effects. However, many people have no side effects or only have minor side effects. Call your doctor or get medical help if any of these side effects or any other side effects bother you or do not go away:  Xgeva:  Feeling tired or weak.  Headache.  Upset stomach or throwing up.  Loose stools (diarrhea).  Cough.  Prolia:  Back pain.  Muscle or  joint pain.  Sore throat.  Runny nose.  Pain in arms or legs.  These are not all of the side effects that may occur. If you have questions about side effects, call your doctor. Call your doctor for medical advice about side effects.  You may report side effects to your national health agency. How is this drug best taken?  Use this drug as ordered by your doctor. Read and follow the dosing on the label closely.  It is given as a shot into the fatty part of the skin. What do I do if I miss a dose?  Call the doctor to find out what to do. How do I store and/or throw out this drug?  This drug will be given to you in a hospital or doctor's office. You will not store it at home.  Keep all drugs out of the reach of children and pets.  Check with your pharmacist about how to throw out unused drugs.  General drug facts  If your symptoms or health problems do not get better or if they become worse, call your doctor.  Do not share your drugs with others and do not take anyone else's drugs.  Keep a list of all your drugs (prescription, natural products, vitamins, OTC) with you. Give this list to your doctor.  Talk with the doctor before starting any new drug, including prescription or OTC, natural products, or vitamins.  Some drugs may have another patient information leaflet. If you have any questions about this drug, please talk with your doctor, pharmacist, or other health care provider.  If you think there has been an overdose, call your poison control center or get medical care right away. Be ready to tell or show what was taken, how much, and when it happened.

## 2019-11-24 ENCOUNTER — Telehealth: Payer: Self-pay

## 2019-11-24 NOTE — Telephone Encounter (Signed)
PA initiated via telephone to [facility] for Prolia 60mg /mL injection. Representatives name is Sparkle.  Currently under review by clinical pharmacist and will be updated via fax.  Reference number is [id number]

## 2019-12-03 NOTE — Telephone Encounter (Signed)
Called and checked on status of PA and it is approved.  Approval number is [id number] Approval is good through 07/30/2020 Reference # is [id number] Rep spoken to is Tiffany.

## 2019-12-10 ENCOUNTER — Ambulatory Visit: Payer: Medicare PPO

## 2019-12-10 ENCOUNTER — Other Ambulatory Visit: Payer: Self-pay

## 2019-12-10 DIAGNOSIS — M810 Age-related osteoporosis without current pathological fracture: Secondary | ICD-10-CM | POA: Diagnosis not present

## 2019-12-10 MED ORDER — DENOSUMAB 60 MG/ML ~~LOC~~ SOSY
60.0000 mg | PREFILLED_SYRINGE | Freq: Once | SUBCUTANEOUS | Status: AC
  Administered 2019-12-10: 60 mg via SUBCUTANEOUS

## 2019-12-10 NOTE — Progress Notes (Signed)
Per orders of Dr. Cruzita Lederer injection of Prolia given today by N.Cleon Signorelli,LPN. Patient tolerated injection well. This is pt's first injection so she was requested to stay at least 15 minutes after the injection for observation. Pt agreed.  After 15 minutes, pt felt good and did not display any obvious signs or symptoms of adverse effects or reactions.  Pt was allowed to leave at this point.

## 2019-12-22 ENCOUNTER — Other Ambulatory Visit: Payer: Self-pay | Admitting: Hematology and Oncology

## 2019-12-31 ENCOUNTER — Encounter: Payer: Medicare Other | Admitting: Internal Medicine

## 2019-12-31 ENCOUNTER — Other Ambulatory Visit (INDEPENDENT_AMBULATORY_CARE_PROVIDER_SITE_OTHER): Payer: Medicare PPO

## 2019-12-31 ENCOUNTER — Other Ambulatory Visit: Payer: Self-pay

## 2019-12-31 DIAGNOSIS — E559 Vitamin D deficiency, unspecified: Secondary | ICD-10-CM

## 2019-12-31 NOTE — Addendum Note (Signed)
Addended by: Kaylyn Lim I on: 12/31/2019 02:06 PM   Modules accepted: Orders

## 2020-01-01 LAB — VITAMIN D 25 HYDROXY (VIT D DEFICIENCY, FRACTURES): Vit D, 25-Hydroxy: 84.2 ng/mL (ref 30.0–100.0)

## 2020-02-11 DIAGNOSIS — H25041 Posterior subcapsular polar age-related cataract, right eye: Secondary | ICD-10-CM | POA: Diagnosis not present

## 2020-02-11 DIAGNOSIS — H47321 Drusen of optic disc, right eye: Secondary | ICD-10-CM | POA: Diagnosis not present

## 2020-02-11 DIAGNOSIS — H52213 Irregular astigmatism, bilateral: Secondary | ICD-10-CM | POA: Diagnosis not present

## 2020-02-11 DIAGNOSIS — H2513 Age-related nuclear cataract, bilateral: Secondary | ICD-10-CM | POA: Diagnosis not present

## 2020-02-11 DIAGNOSIS — H25013 Cortical age-related cataract, bilateral: Secondary | ICD-10-CM | POA: Diagnosis not present

## 2020-02-11 DIAGNOSIS — H2511 Age-related nuclear cataract, right eye: Secondary | ICD-10-CM | POA: Diagnosis not present

## 2020-03-02 ENCOUNTER — Encounter: Payer: Self-pay | Admitting: Internal Medicine

## 2020-03-02 NOTE — Patient Instructions (Signed)

## 2020-03-02 NOTE — Progress Notes (Signed)
Annual Screening/Preventative Visit & Comprehensive Evaluation &  Examination     This very nice 79 y.o.  DWF  presents for a Screening /Preventative Visit & comprehensive evaluation and management of multiple medical co-morbidities.  Patient has been followed for HTN, HLD, Prediabetes  and Vitamin D Deficiency.     Patient has hx/o Osteoporosis with hx/o intolerance to Fosamax and Reclast and in 2017 had DVT & PE's consequent of Evista Tx and is followed now By Dr C. Gherghe treating with Prolia. 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         HTN predates circa 1980's. Patient's BP has been controlled at home and patient denies any cardiac symptoms as chest pain, palpitations, shortness of breath, dizziness or ankle swelling. Today's BP is at goal -  122/84.      Patient's hyperlipidemia is controlled with diet. Last lipids were at goal:  Lab Results  Component Value Date   CHOL 151 09/17/2019   HDL 53 09/17/2019   LDLCALC 76 09/17/2019   TRIG 135 09/17/2019   CHOLHDL 2.8 09/17/2019       Patient has monitored expectantly for abnormal glucose & glucose intolerance  and patient denies reactive hypoglycemic symptoms, visual blurring, diabetic polys or paresthesias. Last A1c was Normal & at goal:  Lab Results  Component Value Date   HGBA1C 5.4 09/17/2019       Finally, patient has history of Vitamin D Deficiency and recent Vitamin D was at goal:  Lab Results  Component Value Date   VD25OH 84.2 12/31/2019    Current Outpatient Medications on File Prior to Visit  Medication Sig  . amoxicillin (AMOXIL) 500 MG capsule Take 4 capsules 2 hours before Dental Procedure  . apixaban (ELIQUIS) 2.5 MG TABS tablet Take 1 tablet 2 x  /day to prevent Blood clots  . calcium carbonate (OS-CAL) 600 MG TABS tablet Take 600 mg by mouth daily with breakfast.   . Cholecalciferol (VITAMIN D PO) Take 5,000 Units by mouth in the  morning and at bedtime.   . furosemide (LASIX) 40 MG tablet Take 1 tablet daily for blood pressure and fluid.  . hydroxyurea (HYDREA) 500 MG capsule TAKE 1 CAPSULE (500 MG TOTAL) BY MOUTH DAILY. MAY TAKE WITH FOOD TO MINIMIZE GI SIDE EFFECTS.  Marland Kitchen MAGNESIUM PO Take 500 mg by mouth 2 (two) times daily.   . metoprolol succinate (TOPROL-XL) 100 MG 24 hr tablet Take 1 tablet Daily for BP  . OVER THE COUNTER MEDICATION Take 1 tablet by mouth daily as needed (allergies). OTC allergy pill  . polyvinyl alcohol (LIQUIFILM TEARS) 1.4 % ophthalmic solution Place 1 drop into both eyes as needed for dry eyes.  . sodium chloride (OCEAN) 0.65 % SOLN nasal spray Place 1 spray into both nostrils as needed for congestion.   No current facility-administered medications on file prior to visit.   Allergies  Allergen Reactions  . Evista [Raloxifene] Other (See Comments)    Blood clots - DVT and Pulmonary Embolism   . Fosamax [Alendronate Sodium]     esophagitis   Past Medical History:  Diagnosis Date  . Cataract   . Clotting disorder ([place])   . DVT of lower extremity (deep venous thrombosis) ([place])    left, resolved  . Elevated hemoglobin A1c   . GERD (gastroesophageal reflux disease)   . H/O left knee surgery   . Hypertension   .  JAK2 gene mutation   . Leiomyoma    adnexal Leiomyoma removal in 1/11  . 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 Nephrolithiasis   . Osteoporosis   . PE (pulmonary embolism) 08/2015   Health Maintenance  Topic Date Due  . Hepatitis C Screening  Never done  . INFLUENZA VACCINE  02/29/2020  . MAMMOGRAM  07/02/2020  . TETANUS/TDAP  12/26/2028  . DEXA SCAN  Completed  . COVID-19 Vaccine  Completed  . PNA vac Low Risk Adult  Completed   Immunization History  Administered Date(s) Administered  . DT (Pediatric) 03/30/2015  . Influenza, High Dose Seasonal PF 05/02/2017, 05/20/2018  . PFIZER SARS-COV-2 Vaccination 09/13/2019, 10/06/2019  . Pneumococcal Conjugate-13  03/25/2014  . Tdap 12/27/2018  . Zoster 12/27/2005    Last Colon - 11/01/2017 - Dr Carlean Purl - Negative - No f/u due to age  Last MGM - 07/03/2019  Past Surgical History:  Procedure Laterality Date  . CHOLECYSTECTOMY    . cyst removed off spine    . IVC FILTER PLACEMENT ([facility] HX)  08/2015  . JOINT REPLACEMENT Left total knee  . TONSILLECTOMY    . UTERINE FIBROID SURGERY     Family History  Problem Relation Age of Onset  . Hypertension Mother   . Diabetes Mother   . Breast cancer Mother   . Congestive Heart Failure Mother        Died age 11  . Osteoporosis Father   . Colon cancer Neg Hx   . Colon polyps Neg Hx   . Esophageal cancer Neg Hx   . Rectal cancer Neg Hx   . Stomach cancer Neg Hx    Social History   Tobacco Use  . Smoking status: Never Smoker  . Smokeless tobacco: Never Used  Vaping Use  . Vaping Use: Never used  Substance Use Topics  . Alcohol use: No  . Drug use: No    ROS Constitutional: Denies fever, chills, weight loss/gain, headaches, insomnia,  night sweats, and change in appetite. Does c/o fatigue. Eyes: Denies redness, blurred vision, diplopia, discharge, itchy, watery eyes.  ENT: Denies discharge, congestion, post nasal drip, epistaxis, sore throat, earache, hearing loss, dental pain, Tinnitus, Vertigo, Sinus pain, snoring.  Cardio: Denies chest pain, palpitations, irregular heartbeat, syncope, dyspnea, diaphoresis, orthopnea, PND, claudication, edema Respiratory: denies cough, dyspnea, DOE, pleurisy, hoarseness, laryngitis, wheezing.  Gastrointestinal: Denies dysphagia, heartburn, reflux, water brash, pain, cramps, nausea, vomiting, bloating, diarrhea, constipation, hematemesis, melena, hematochezia, jaundice, hemorrhoids Genitourinary: Denies dysuria, frequency, urgency, nocturia, hesitancy, discharge, hematuria, flank pain Breast: Breast lumps, nipple discharge, bleeding.  Musculoskeletal: Denies arthralgia, myalgia, stiffness, Jt. Swelling, pain,  limp, and strain/sprain. Denies falls. Skin: Denies puritis, rash, hives, warts, acne, eczema, changing in skin lesion Neuro: No weakness, tremor, incoordination, spasms, paresthesia, pain Psychiatric: Denies confusion, memory loss, sensory loss. Denies Depression. Endocrine: Denies change in weight, skin, hair change, nocturia, and paresthesia, diabetic polys, visual blurring, hyper / hypo glycemic episodes.  Heme/Lymph: No excessive bleeding, bruising, enlarged lymph nodes.  Physical Exam  BP 122/84   Pulse 68   Temp (!) 97.2 F (36.2 C)   Resp 16   Ht 5\' 3"  (1.6 m)   Wt 176 lb 9.6 oz (80.1 kg)   BMI 31.28 kg/m   General Appearance: Well nourished, well groomed and in no apparent distress.  Eyes: PERRLA, EOMs, conjunctiva no swelling or erythema, normal fundi and vessels. Sinuses: No frontal/maxillary tenderness ENT/Mouth: EACs patent / TMs  nl.  Nares clear without erythema, swelling, mucoid exudates. Oral hygiene is good. No erythema, swelling, or exudate. Tongue normal, non-obstructing. Tonsils not swollen or erythematous. Hearing normal.  Neck: Supple, thyroid not palpable. No bruits, nodes or JVD. Respiratory: Respiratory effort normal.  BS equal and clear bilateral without rales, rhonci, wheezing or stridor. Cardio: Heart sounds are normal with regular rate and rhythm and no murmurs, rubs or gallops. Peripheral pulses are normal and equal bilaterally without edema. No aortic or femoral bruits. Chest: symmetric with normal excursions and percussion. Breasts: Symmetric, without lumps, nipple discharge, retractions, or fibrocystic changes.  Abdomen: Flat, soft with bowel sounds active. Nontender, no guarding, rebound, hernias, masses, or organomegaly.  Lymphatics: Non tender without lymphadenopathy.  Genitourinary:  Musculoskeletal: Full ROM all peripheral extremities, joint stability, 5/5 strength, and normal gait. Skin: Warm and dry without rashes, lesions, cyanosis, clubbing  or  ecchymosis.  Neuro: Cranial nerves intact, reflexes equal bilaterally. Normal muscle tone, no cerebellar symptoms. Sensation intact.  Pysch: Alert and oriented X 3, normal affect, Insight and Judgment appropriate.   Assessment and Plan  1. Annual Preventative Screening Examination  2. Essential hypertension  - EKG 12-Lead - Urinalysis, Routine w reflex microscopic - Microalbumin / creatinine urine ratio - CBC with Differential/Platelet - COMPLETE METABOLIC PANEL WITH GFR - Magnesium - TSH  3. Hyperlipidemia, mixed  - EKG 12-Lead - Lipid panel - TSH  4. Abnormal glucose  - EKG 12-Lead - Hemoglobin A1c - Insulin, random  5. Vitamin D deficiency  6. Myeloproliferative disorder (HCC)  - CBC with Differential/Platelet  7. Chronic anticoagulation  - CBC with Differential/Platelet  8. Osteoporosis   - COMPLETE METABOLIC PANEL WITH GFR  9. Screening for colorectal cancer  - POC Hemoccult Bld/Stl   10. Screening for ischemic heart disease  - EKG 12-Lead  11. FHx: heart disease  - EKG 12-Lead  12. Medication management  - Urinalysis, Routine w reflex microscopic - Microalbumin / creatinine urine ratio - CBC with Differential/Platelet - COMPLETE METABOLIC PANEL WITH GFR - Magnesium - Lipid panel - TSH - Hemoglobin A1c - Insulin, random       Patient was counseled in prudent diet to achieve/maintain BMI less than 25 for weight control, BP monitoring, regular exercise and medications. Discussed med's effects and SE's. Screening labs and tests as requested with regular follow-up as recommended. Over 40 minutes of exam, counseling, chart review and high complex critical decision making was performed.   Kirtland Bouchard, MD

## 2020-03-03 ENCOUNTER — Ambulatory Visit: Payer: Medicare Other | Admitting: Internal Medicine

## 2020-03-03 ENCOUNTER — Other Ambulatory Visit: Payer: Self-pay

## 2020-03-03 VITALS — BP 122/84 | HR 68 | Temp 97.2°F | Resp 16 | Ht 63.0 in | Wt 176.6 lb

## 2020-03-03 DIAGNOSIS — Z8249 Family history of ischemic heart disease and other diseases of the circulatory system: Secondary | ICD-10-CM | POA: Diagnosis not present

## 2020-03-03 DIAGNOSIS — Z0000 Encounter for general adult medical examination without abnormal findings: Secondary | ICD-10-CM

## 2020-03-03 DIAGNOSIS — R7309 Other abnormal glucose: Secondary | ICD-10-CM | POA: Diagnosis not present

## 2020-03-03 DIAGNOSIS — M810 Age-related osteoporosis without current pathological fracture: Secondary | ICD-10-CM

## 2020-03-03 DIAGNOSIS — Z1212 Encounter for screening for malignant neoplasm of rectum: Secondary | ICD-10-CM

## 2020-03-03 DIAGNOSIS — D471 Chronic myeloproliferative disease: Secondary | ICD-10-CM | POA: Diagnosis not present

## 2020-03-03 DIAGNOSIS — Z79899 Other long term (current) drug therapy: Secondary | ICD-10-CM | POA: Diagnosis not present

## 2020-03-03 DIAGNOSIS — Z136 Encounter for screening for cardiovascular disorders: Secondary | ICD-10-CM

## 2020-03-03 DIAGNOSIS — I10 Essential (primary) hypertension: Secondary | ICD-10-CM | POA: Diagnosis not present

## 2020-03-03 DIAGNOSIS — Z7901 Long term (current) use of anticoagulants: Secondary | ICD-10-CM | POA: Diagnosis not present

## 2020-03-03 DIAGNOSIS — E782 Mixed hyperlipidemia: Secondary | ICD-10-CM | POA: Diagnosis not present

## 2020-03-03 DIAGNOSIS — Z1211 Encounter for screening for malignant neoplasm of colon: Secondary | ICD-10-CM

## 2020-03-03 DIAGNOSIS — E559 Vitamin D deficiency, unspecified: Secondary | ICD-10-CM

## 2020-03-03 DIAGNOSIS — Z0001 Encounter for general adult medical examination with abnormal findings: Secondary | ICD-10-CM

## 2020-03-04 LAB — COMPLETE METABOLIC PANEL WITH GFR
AG Ratio: 1.4 (calc) (ref 1.0–2.5)
ALT: 19 U/L (ref 6–29)
AST: 22 U/L (ref 10–35)
Albumin: 4.1 g/dL (ref 3.6–5.1)
Alkaline phosphatase (APISO): 58 U/L (ref 37–153)
BUN: 24 mg/dL (ref 7–25)
CO2: 31 mmol/L (ref 20–32)
Calcium: 9.9 mg/dL (ref 8.6–10.4)
Chloride: 105 mmol/L (ref 98–110)
Creat: 0.91 mg/dL (ref 0.60–0.93)
GFR, Est African American: 70 mL/min/{1.73_m2} (ref >=60)
GFR, Est Non African American: 60 mL/min/{1.73_m2} (ref >=60)
Globulin: 2.9 g/dL (calc) (ref 1.9–3.7)
Glucose, Bld: 98 mg/dL (ref 65–99)
Potassium: 4.2 mmol/L (ref 3.5–5.3)
Sodium: 142 mmol/L (ref 135–146)
Total Bilirubin: 0.9 mg/dL (ref 0.2–1.2)
Total Protein: 7 g/dL (ref 6.1–8.1)

## 2020-03-04 LAB — CBC WITH DIFFERENTIAL/PLATELET
Absolute Monocytes: 539 cells/uL (ref 200–950)
Basophils Absolute: 68 cells/uL (ref 0–200)
Basophils Relative: 1.1 %
Eosinophils Absolute: 198 cells/uL (ref 15–500)
Eosinophils Relative: 3.2 %
HCT: 47.4 % — ABNORMAL HIGH (ref 35.0–45.0)
Hemoglobin: 15.9 g/dL — ABNORMAL HIGH (ref 11.7–15.5)
Lymphs Abs: 1804 cells/uL (ref 850–3900)
MCH: 32.7 pg (ref 27.0–33.0)
MCHC: 33.5 g/dL (ref 32.0–36.0)
MCV: 97.5 fL (ref 80.0–100.0)
MPV: 10.7 fL (ref 7.5–12.5)
Monocytes Relative: 8.7 %
Neutro Abs: 3590 cells/uL (ref 1500–7800)
Neutrophils Relative %: 57.9 %
Platelets: 352 10*3/uL (ref 140–400)
RBC: 4.86 10*6/uL (ref 3.80–5.10)
RDW: 13.1 % (ref 11.0–15.0)
Total Lymphocyte: 29.1 %
WBC: 6.2 10*3/uL (ref 3.8–10.8)

## 2020-03-04 LAB — URINALYSIS, ROUTINE W REFLEX MICROSCOPIC
Bilirubin Urine: NEGATIVE
Glucose, UA: NEGATIVE
Hgb urine dipstick: NEGATIVE
Ketones, ur: NEGATIVE
Leukocytes,Ua: NEGATIVE
Nitrite: NEGATIVE
Protein, ur: NEGATIVE
Specific Gravity, Urine: 1.01 (ref 1.001–1.03)
pH: <=5 (ref 5.0–8.0)

## 2020-03-04 LAB — LIPID PANEL
Cholesterol: 184 mg/dL (ref <200)
HDL: 56 mg/dL (ref >=50)
LDL Cholesterol (Calc): 105 mg/dL (calc) — ABNORMAL HIGH
Non-HDL Cholesterol (Calc): 128 mg/dL (calc) (ref <130)
Total CHOL/HDL Ratio: 3.3 (calc) (ref <5.0)
Triglycerides: 131 mg/dL (ref <150)

## 2020-03-04 LAB — HEMOGLOBIN A1C
Hgb A1c MFr Bld: 5.4 % of total Hgb (ref <5.7)
Mean Plasma Glucose: 108 (calc)
eAG (mmol/L): 6 (calc)

## 2020-03-04 LAB — MICROALBUMIN / CREATININE URINE RATIO
Creatinine, Urine: 28 mg/dL (ref 20–275)
Microalb, Ur: <0.2 mg/dL

## 2020-03-04 LAB — INSULIN, RANDOM: Insulin: 32.8 u[IU]/mL — ABNORMAL HIGH

## 2020-03-04 LAB — TSH: TSH: 1.51 mIU/L (ref 0.40–4.50)

## 2020-03-04 LAB — MAGNESIUM: Magnesium: 1.8 mg/dL (ref 1.5–2.5)

## 2020-03-04 NOTE — Progress Notes (Signed)
===========================================================  -    Magnesium  -   1.8  -  very  low- goal is betw 2.0 - 2.5,   - So..............Marland Kitchen  Recommend that you take  Magnesium 500 mg tablet  x 2 tablets /daily   - also important to eat lots of  leafy green vegetables   - spinach - Kale - collards - greens - okra - asparagus  - broccoli - quinoa - squash - almonds   - black, red, white beans  -  peas - green beans ===========================================================  - Total Chol = 184 -  Excellent   - Very low risk for Heart Attack  / Stroke =============================================================  -  A1c - Normal - Great - No Diabetes ===========================================================  - All Else - CBC - Kidneys - Electrolytes - Liver - Magnesium & Thyroid    - all  Normal / OK ===========================================================   - Keep up the [place] Work !  ===========================================================

## 2020-03-24 ENCOUNTER — Other Ambulatory Visit: Payer: Self-pay | Admitting: Internal Medicine

## 2020-05-14 ENCOUNTER — Telehealth: Payer: Self-pay | Admitting: *Deleted

## 2020-05-14 MED ORDER — METRONIDAZOLE 500 MG PO TABS
500.0000 mg | ORAL_TABLET | Freq: Three times a day (TID) | ORAL | 0 refills | Status: AC
Start: 2020-05-14 — End: 2020-05-24

## 2020-05-14 MED ORDER — CIPROFLOXACIN HCL 500 MG PO TABS
500.0000 mg | ORAL_TABLET | Freq: Two times a day (BID) | ORAL | 0 refills | Status: AC
Start: 2020-05-14 — End: 2020-05-24

## 2020-05-14 NOTE — Telephone Encounter (Signed)
Returned call yo patient regarding diverticulitis pain. Patient was advised she can take tylenol and Pepcid for abdominal discomfort and RX sent to the patient's pharmacy, per Dr Melford Aase.

## 2020-05-20 ENCOUNTER — Ambulatory Visit: Payer: Medicare PPO | Admitting: Internal Medicine

## 2020-05-20 ENCOUNTER — Other Ambulatory Visit: Payer: Self-pay

## 2020-05-20 VITALS — BP 114/80 | HR 76 | Temp 97.7°F | Resp 16 | Ht 63.0 in | Wt 177.6 lb

## 2020-05-20 DIAGNOSIS — K5792 Diverticulitis of intestine, part unspecified, without perforation or abscess without bleeding: Secondary | ICD-10-CM | POA: Diagnosis not present

## 2020-05-23 ENCOUNTER — Encounter: Payer: Self-pay | Admitting: Internal Medicine

## 2020-05-23 NOTE — Progress Notes (Signed)
   History of Present Illness:     Patient is a very nice 79 yo MWF treated recently on 10/9 for suspected Diverticulitis and presents now with persistent symptoms of bloating and low abdominal discomfort. Denies fever chills or diarrhea. Apparently, her  pharmacy only dispensed her 1/2 amounts of her Cipro & Flagyl.    Medications  .  furosemide  40 MG tablet, TAKE 1 TABLET DAILY  .  metoprolol succ-XL 100 MG , Take 1 tablet Daily   .  sodium chloride 0.65 % SOLN nasal , Place 1 spray into both nostrils as needed  .  apixaban (ELIQUIS) 2.5 MG TABS tablet, Take 1 tablet 2 x  /day to prevent Blood clots .  amoxicillin (AMOXIL) 500 MG capsule, Take 4 capsules 2 hours before Dental Procedure .  OS-CAL 600 MG TABS, Take 600 mg  daily with breakfast.  .  VITAMIN D, Take 5,000 Units in the morning and at bedtime.  .  ciprofloxacin  500 MG tablet, Take 1 tablet 2  times daily for 10 days. .  hydroxyurea  500 MG capsule, TAKE 1 CAPSULE  DAILY .  MAGNESIUM , Take 500 mg  2 times daily.  .  metroNIDAZOLE  500 MG tablet, Take 1 tablet 3 times daily for 10 days. .   OTC allergy pill - Take 1 tablet by mouth daily as needed (allergies). .  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   Observations/Objective:   BP 114/80   Pulse 76   Temp 97.7 F (36.5 C)   Resp 16   Ht 5\' 3"  (1.6 m)   Wt 177 lb 9.6 oz (80.6 kg)   SpO2 99%   BMI 31.46 kg/m   HEENT - WNL. Neck -  supple.  Chest - Clear equal BS. Cor - Nl HS. RRR w/o sig MGR. PP 1(+). No edema. Abd -  Soft w/o masse or guarding . (+) LLQ tenderness.  MS- FROM w/o deformities.  Gait Nl. Neuro -  Nl w/o focal abnormalities.  Assessment and Plan  1. Diverticulitis  - encouraged her to complete her antibiotics.   Follow Up Instructions:      I discussed the assessment and treatment plan with the patient. The patient was provided an opportunity to ask questions and all were answered. The patient agreed with the plan and demonstrated an understanding of the instructions.      The patient was advised to call back or seek an in-person evaluation if the symptoms worsen or if the condition fails to improve as anticipated.    Kirtland Bouchard, MD

## 2020-06-08 ENCOUNTER — Ambulatory Visit: Payer: Medicare PPO | Admitting: Adult Health

## 2020-06-18 ENCOUNTER — Telehealth: Payer: Self-pay

## 2020-06-18 NOTE — Telephone Encounter (Signed)
Left pt a VM requesting she call back to schedule a nurse visit for Prolia-Approval # is [id number] and is good thru 07/30/21-she owes $0 at check-in and can be scheduled anytime

## 2020-06-22 ENCOUNTER — Ambulatory Visit (INDEPENDENT_AMBULATORY_CARE_PROVIDER_SITE_OTHER): Payer: Medicare PPO

## 2020-06-22 ENCOUNTER — Other Ambulatory Visit: Payer: Self-pay

## 2020-06-22 DIAGNOSIS — M810 Age-related osteoporosis without current pathological fracture: Secondary | ICD-10-CM

## 2020-06-22 MED ORDER — DENOSUMAB 60 MG/ML ~~LOC~~ SOSY
60.0000 mg | PREFILLED_SYRINGE | Freq: Once | SUBCUTANEOUS | Status: AC
Start: 2020-06-22 — End: 2020-06-22
  Administered 2020-06-22: 60 mg via SUBCUTANEOUS

## 2020-06-22 NOTE — Progress Notes (Signed)
Prolia injection given. Patient tolerated well

## 2020-06-23 ENCOUNTER — Other Ambulatory Visit: Payer: Self-pay | Admitting: Internal Medicine

## 2020-06-23 DIAGNOSIS — I10 Essential (primary) hypertension: Secondary | ICD-10-CM

## 2020-06-28 ENCOUNTER — Encounter: Payer: Self-pay | Admitting: Adult Health

## 2020-06-28 NOTE — Progress Notes (Deleted)
Medicare wellness  Assessment:   Essential hypertension - continue medications, DASH diet, exercise and monitor at home. Call if greater than 130/80.  - CBC with Differential/Platelet - BASIC METABOLIC PANEL WITH GFR - Hepatic function panel - TSH - Urinalysis, Routine w reflex microscopic (not at [facility]) - Microalbumin / creatinine urine ratio - EKG 12-Lead  Mixed hyperlipidemia -continue medications, check lipids, decrease fatty foods, increase activity.  - Lipid panel  Prediabetes Discussed general issues about diabetes pathophysiology and management., Educational material distributed., Suggested low cholesterol diet., Encouraged aerobic exercise., Discussed foot care., Reminded to get yearly retinal exam. 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     Future Appointments  Date Time Provider [place]  06/29/2020  4:00 PM Liane Comber, NP [facility] None  07/01/2020 10:15 AM [facility] None  07/01/2020 10:40 AM [name], MD [facility] None  09/21/2020  2:30 PM Unk Pinto, MD [facility] None  10/28/2020  2:00 PM Philemon Kingdom, MD [facility] None  03/14/2021 10:00 AM Unk Pinto, MD [facility] None  06/29/2021 11:00 AM Liane Comber, NP [facility] None     Plan:   During the  course of the visit the patient was educated and counseled about appropriate screening and preventive services including:  6 month  Influenza vaccine- declines until next year  Screening electrocardiogram  Screening mammography- yearly  Bone densitometry screening  Colorectal cancer screening  Diabetes screening- today  Glaucoma screening- yearly  Nutrition counseling   Advanced directives:    Subjective:   Kimberly Watson is a 79 y.o. female who presents for Medicare Annual Wellness Visit and 3 month follow up on hypertension, prediabetes, hyperlipidemia, vitamin D def.   She states she has history of diverticulitis, has been having left flank pain, fecal incontinence, diarrhea.   Her blood pressure has been controlled at home, today their BP is    She has joined a gym, recently has not been going but will start again but she has been traveling a lot and wants to restart it. She denies chest pain, shortness of breath, dizziness. She has not had any shortness of breath, had pulmonary hypertension on echo due to PE, not symptoms at this time.   Her cholesterol is diet controlled.  Her cholesterol is controlled. The cholesterol last visit was:   Lab Results  Component Value Date   CHOL 184 03/03/2020   HDL 56 03/03/2020   LDLCALC 105 (H) 03/03/2020   TRIG 131 03/03/2020   CHOLHDL 3.3 03/03/2020   She has been working on diet and exercise for prediabetes, and has no polyuria or polydipsia, no chest pain, dyspnea or TIAs, no numbness, tingling or pain in extremities. Last A1C in the office was:  Lab Results  Component Value Date   HGBA1C 5.4 03/03/2020   Patient is on Vitamin D supplement.   She was on evista for osteoporosis, she was taken off  due to submassive bilateral saddle PE with right heart strain. She has seen Dr. Alvy Bimler, still on elliquis.  She has history of nephrolithiasis, follows with Dr. Karsten Ro.    BMI is There is no height or weight on file to  calculate BMI., she is working on diet and exercise. Wt Readings from Last 3 Encounters:  05/20/20 177 lb 9.6 oz (80.6 kg)  03/03/20 176 lb 9.6 oz (80.1 kg)  10/27/19 174 lb (78.9 kg)      Medication Review Current Outpatient Medications on File Prior to Visit  Medication Sig Dispense Refill  . amoxicillin (AMOXIL) 500 MG capsule Take 4 capsules 2 hours before Dental Procedure 12 capsule 1  . apixaban (ELIQUIS) 2.5 MG TABS tablet Take 1 tablet 2 x  /day to prevent Blood clots 180 tablet 3  . calcium carbonate (OS-CAL) 600 MG TABS tablet Take 600 mg by mouth daily with breakfast.     . Cholecalciferol (VITAMIN D PO) Take 5,000 Units by mouth in the morning and at bedtime.     . furosemide (LASIX) 40 MG tablet TAKE 1 TABLET DAILY FOR BLOOD PRESSURE AND FLUID. 90 tablet 1  . hydroxyurea (HYDREA) 500 MG capsule TAKE 1 CAPSULE (500 MG TOTAL) BY MOUTH DAILY. MAY TAKE WITH FOOD TO MINIMIZE GI SIDE EFFECTS. 90 capsule 3  . MAGNESIUM PO Take 500 mg by mouth 2 (two) times daily.     . metoprolol succinate (TOPROL-XL) 100 MG 24 hr tablet TAKE 1 TABLET DAILY FOR BLOOD PRESSURE 90 tablet 3  . OVER THE COUNTER MEDICATION Take 1 tablet by mouth daily as needed (allergies). OTC allergy pill    . polyvinyl alcohol (LIQUIFILM TEARS) 1.4 % ophthalmic solution Place 1 drop into both eyes as needed for dry eyes.    . sodium chloride (OCEAN) 0.65 % SOLN nasal spray Place 1 spray into both nostrils as needed for congestion.     No current facility-administered medications on file prior to visit.    Current Problems (verified) Patient Active Problem List   Diagnosis Date Noted  . Poor vision 10/02/2019  . Abnormal glucose 09/18/2019  . 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 Goals of care, counseling/discussion 09/13/2017  . 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 Hiatal hernia 08/09/2017  . Diverticulosis 08/09/2017  . Osteoarthritis of right knee 08/09/2017  . Pulmonary  hypertension due to thromboembolism ([place]) 04/24/2016  . History of recurrent deep vein thrombosis (DVT)   . Osteoporosis 03/30/2015  . Medication management 03/30/2015  . Essential hypertension 06/18/2013  . Hyperlipidemia, mixed 06/18/2013  . Vitamin D deficiency 06/18/2013  . GERD (gastroesophageal reflux disease) 06/18/2013  . Nephrolithiasis 06/18/2013   Screening Tests Immunization History  Administered Date(s) Administered  . DT (Pediatric) 03/30/2015  . Influenza, High Dose Seasonal PF 04/20/2016, 05/02/2017, 05/20/2018  . PFIZER SARS-COV-2 Vaccination 09/13/2019, 10/06/2019, 06/15/2020  . Pneumococcal Conjugate-13 03/25/2014  . Tdap 12/27/2018  . Zoster 12/27/2005    Preventative care: Last colonoscopy: 10/2017 Last mammogram: 04/2017 Last pap smear/pelvic exam: remote declines another DEXA: 04/2017 Osteopenia- had (reclast 08,09,11) DUE Echo 08/2015  Prior vaccinations: TD or Tdap: 2016 Influenza: 2019 TODAY Prevnar 13: 2015 Pneumococcal: 2008  Shingles/Zostavax: 2007  Names of Other Physician/Practitioners you currently use: 1. [facility]- here for primary care 2. Dr.Hecker, eye doctor, last visit Glasses/contacts, 2019 3. Judeth Horn Dentist, last visit every 6 months  Patient Care Team: Unk Pinto, MD as PCP - General (Internal Medicine) Monna Fam, MD  as Consulting Physician (Ophthalmology) Gatha Mayer, MD as Consulting Physician (Gastroenterology) Kathie Rhodes, MD (Inactive) as Consulting Physician (Urology) Cheri Fowler, MD as Consulting Physician (Obstetrics and Gynecology)   Allergies Allergies  Allergen Reactions  . Evista [Raloxifene] Other (See Comments)    Blood clots - DVT and Pulmonary Embolism   . Fosamax [Alendronate Sodium]     esophagitis    SURGICAL HISTORY She  has a past surgical history that includes Joint replacement (Left, total knee); Uterine fibroid surgery;  Cholecystectomy; IVC FILTER PLACEMENT ([facility] HX) (08/2015); Tonsillectomy; and cyst removed off spine. FAMILY HISTORY Her family history includes Breast cancer in her mother; Congestive Heart Failure in her mother; Diabetes in her mother; Hypertension in her mother; Osteoporosis in her father. SOCIAL HISTORY She  reports that she has never smoked. She has never used smokeless tobacco. She reports that she does not drink alcohol and does not use drugs.  MEDICARE WELLNESS OBJECTIVES: Physical activity:   Cardiac risk factors:   Depression/mood screen:   Depression screen [facility] 2/9 03/02/2020  Decreased Interest 0  Down, Depressed, Hopeless 0  PHQ - 2 Score 0    ADLs:  In your present state of health, do you have any difficulty performing the following activities: 03/02/2020 09/17/2019  Hearing? N N  Vision? N N  Difficulty concentrating or making decisions? N N  Walking or climbing stairs? N N  Dressing or bathing? N N  Doing errands, shopping? N N  Some recent data might be hidden    Cognitive Testing  Alert? Yes  Normal Appearance?Yes  Oriented to person? Yes  Place? Yes   Time? Yes  Recall of three objects?  Yes  Can perform simple calculations? Yes  Displays appropriate judgment?Yes  Can read the correct time from a watch face?Yes  EOL planning:    Review of Systems  Constitutional: Negative.   HENT: Negative for congestion, ear discharge, ear pain, hearing loss, nosebleeds, sore throat and tinnitus.   Eyes: Negative.   Respiratory: Negative for cough, hemoptysis, sputum production, shortness of breath, wheezing and stridor.   Cardiovascular: Negative for chest pain, palpitations, orthopnea, claudication, leg swelling and PND.  Gastrointestinal: Negative for abdominal pain, blood in stool, constipation, diarrhea, heartburn, melena, nausea and vomiting.  Genitourinary: Negative.   Musculoskeletal: Positive for joint pain (right knee). Negative for back pain, falls, myalgias and  neck pain.  Skin: Negative.   Neurological: Negative.  Negative for headaches.  Psychiatric/Behavioral: Negative.      Objective:   There were no vitals taken for this visit. There is no height or weight on file to calculate BMI.  General appearance: alert, no distress, WD/WN,  female HEENT: normocephalic, sclerae anicteric, TMs pearly, nares patent, no discharge or erythema, pharynx normal Oral cavity: MMM, no lesions Neck: supple, no lymphadenopathy, no thyromegaly, no masses Heart: RRR, normal S1, S2, no murmurs Lungs: CTA bilaterally, no wheezes, rhonchi, or rales Abdomen: +bs, soft, non tender, non distended, no masses, no hepatomegaly, no splenomegaly Musculoskeletal: nontender, no swelling, no obvious deformity,  Extremities: no edema, no cyanosis, no clubbing Pulses: 2+ symmetric, upper and lower extremities, normal cap refill Neurological: alert, oriented x 3, CN2-12 intact, strength normal upper extremities and lower extremities, sensation normal throughout, DTRs 2+ throughout, no cerebellar signs, gait antalgic Psychiatric: normal affect, behavior normal, pleasant  Breast: nontender, no masses or lumps, no skin changes, no nipple discharge or inversion, no axillary lymphadenopathy Gyn: defer  Rectal: defer  Medicare Attestation I have personally reviewed:  The patient's medical and social history Their use of alcohol, tobacco or illicit drugs Their current medications and supplements The patient's functional ability including ADLs,fall risks, home safety risks, cognitive, and hearing and visual impairment Diet and physical activities Evidence for depression or mood disorders  The patient's weight, height, BMI, and visual acuity have been recorded in the chart.  I have made referrals, counseling, and provided education to the patient based on review of the above and I have provided the patient with a written personalized care plan for preventive services.     Izora Ribas, NP   06/28/2020

## 2020-06-29 ENCOUNTER — Ambulatory Visit: Payer: Medicare PPO | Admitting: Adult Health

## 2020-07-01 ENCOUNTER — Other Ambulatory Visit: Payer: Self-pay

## 2020-07-01 ENCOUNTER — Encounter: Payer: Self-pay | Admitting: Hematology and Oncology

## 2020-07-01 ENCOUNTER — Inpatient Hospital Stay: Payer: Medicare PPO

## 2020-07-01 ENCOUNTER — Inpatient Hospital Stay: Payer: Medicare PPO | Attending: Hematology and Oncology | Admitting: Hematology and Oncology

## 2020-07-01 ENCOUNTER — Telehealth: Payer: Self-pay | Admitting: Hematology and Oncology

## 2020-07-01 VITALS — BP 182/91 | HR 66 | Temp 97.5°F | Resp 18 | Ht 63.0 in | Wt 174.4 lb

## 2020-07-01 DIAGNOSIS — Z86711 Personal history of pulmonary embolism: Secondary | ICD-10-CM | POA: Diagnosis not present

## 2020-07-01 DIAGNOSIS — C946 Myelodysplastic disease, not classified: Secondary | ICD-10-CM | POA: Insufficient documentation

## 2020-07-01 DIAGNOSIS — Z79899 Other long term (current) drug therapy: Secondary | ICD-10-CM | POA: Diagnosis not present

## 2020-07-01 DIAGNOSIS — Z23 Encounter for immunization: Secondary | ICD-10-CM | POA: Diagnosis not present

## 2020-07-01 DIAGNOSIS — I10 Essential (primary) hypertension: Secondary | ICD-10-CM | POA: Diagnosis not present

## 2020-07-01 DIAGNOSIS — D471 Chronic myeloproliferative disease: Secondary | ICD-10-CM

## 2020-07-01 DIAGNOSIS — Z86718 Personal history of other venous thrombosis and embolism: Secondary | ICD-10-CM | POA: Insufficient documentation

## 2020-07-01 DIAGNOSIS — Z7901 Long term (current) use of anticoagulants: Secondary | ICD-10-CM | POA: Insufficient documentation

## 2020-07-01 DIAGNOSIS — Z803 Family history of malignant neoplasm of breast: Secondary | ICD-10-CM | POA: Diagnosis not present

## 2020-07-01 DIAGNOSIS — Z299 Encounter for prophylactic measures, unspecified: Secondary | ICD-10-CM | POA: Diagnosis not present

## 2020-07-01 LAB — CBC WITH DIFFERENTIAL/PLATELET
Abs Immature Granulocytes: 0.02 10*3/uL (ref 0.00–0.07)
Basophils Absolute: 0.1 10*3/uL (ref 0.0–0.1)
Basophils Relative: 1 %
Eosinophils Absolute: 0.2 10*3/uL (ref 0.0–0.5)
Eosinophils Relative: 3 %
HCT: 46.2 % — ABNORMAL HIGH (ref 36.0–46.0)
Hemoglobin: 14.5 g/dL (ref 12.0–15.0)
Immature Granulocytes: 0 %
Lymphocytes Relative: 33 %
Lymphs Abs: 2.1 10*3/uL (ref 0.7–4.0)
MCH: 31.7 pg (ref 26.0–34.0)
MCHC: 31.4 g/dL (ref 30.0–36.0)
MCV: 101.1 fL — ABNORMAL HIGH (ref 80.0–100.0)
Monocytes Absolute: 0.6 10*3/uL (ref 0.1–1.0)
Monocytes Relative: 9 %
Neutro Abs: 3.5 10*3/uL (ref 1.7–7.7)
Neutrophils Relative %: 54 %
Platelets: 306 10*3/uL (ref 150–400)
RBC: 4.57 MIL/uL (ref 3.87–5.11)
RDW: 14.4 % (ref 11.5–15.5)
WBC: 6.5 10*3/uL (ref 4.0–10.5)
nRBC: 0 % (ref 0.0–0.2)

## 2020-07-01 LAB — COMPREHENSIVE METABOLIC PANEL
ALT: 15 U/L (ref 0–44)
AST: 20 U/L (ref 15–41)
Albumin: 3.8 g/dL (ref 3.5–5.0)
Alkaline Phosphatase: 62 U/L (ref 38–126)
Anion gap: 9 (ref 5–15)
BUN: 12 mg/dL (ref 8–23)
CO2: 24 mmol/L (ref 22–32)
Calcium: 9.6 mg/dL (ref 8.9–10.3)
Chloride: 108 mmol/L (ref 98–111)
Creatinine, Ser: 0.82 mg/dL (ref 0.44–1.00)
GFR, Estimated: >60 mL/min (ref >60)
Glucose, Bld: 93 mg/dL (ref 70–99)
Potassium: 4 mmol/L (ref 3.5–5.1)
Sodium: 141 mmol/L (ref 135–145)
Total Bilirubin: 1.4 mg/dL — ABNORMAL HIGH (ref 0.3–1.2)
Total Protein: 7.2 g/dL (ref 6.5–8.1)

## 2020-07-01 MED ORDER — INFLUENZA VAC A&B SA ADJ QUAD 0.5 ML IM PRSY
0.5000 mL | PREFILLED_SYRINGE | Freq: Once | INTRAMUSCULAR | Status: AC
  Administered 2020-07-01: 0.5 mL via INTRAMUSCULAR

## 2020-07-01 MED ORDER — INFLUENZA VAC A&B SA ADJ QUAD 0.5 ML IM PRSY
PREFILLED_SYRINGE | INTRAMUSCULAR | Status: AC
  Filled 2020-07-01: qty 0.5

## 2020-07-01 NOTE — Progress Notes (Signed)
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  Kimberly Watson is warned to call me if Kimberly Watson feels ill with infection, stress or planning to have surgery  History of recurrent deep vein thrombosis (DVT) 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 So far, Kimberly Watson has no bleeding complications Kimberly Watson might need to interrupt anticoagulation therapy in anticipation for future cataract surgery I recommend Kimberly Watson call me when that is scheduled  Essential hypertension Her blood pressure is intermittently elevated There is a component of whitecoat hypertension Kimberly Watson will continue blood pressure management through her primary care doctor  Preventive measure We discussed the importance of preventive care and reviewed the vaccination programs. Kimberly Watson does not have any prior allergic reactions to influenza vaccination. Kimberly Watson agrees to proceed with influenza vaccination today and we will administer it today at the clinic.    No orders of the defined types were placed in this encounter.   All questions were answered. The patient knows to call the clinic with any problems, questions or concerns. The total time spent in the appointment was 20 minutes encounter with patients including review of chart and various tests results, discussions about plan of care and coordination of care plan   [name], MD 07/01/2020 11:32  AM  INTERVAL HISTORY: Please see below for problem oriented charting. Kimberly Watson returns for further follow-up Kimberly Watson tolerated hydroxyurea well No recent infection, fever or chills No recent bleeding from anticoagulation therapy From my previous visit, we discussed potential knee surgery Kimberly Watson is not keen to have that surgery due to difficulties with lying down after surgery Kimberly Watson might have cataract surgery next year  SUMMARY OF ONCOLOGIC HISTORY:  Kimberly Watson was referred here after recent diagnosis of severe left lower extremity DVT with bilateral PE and cor pulmonale. I reviewed her records extensively and collaborated the history with the patient. The patient has been taking Evista for over 25 years due to strong family history of breast cancer and for osteoporosis management. In January 2017, Kimberly Watson had viral gastroenteritis with severe diarrhea and dehydration. The patient has been taking Lasix chronically for mild bilateral lower extremity edema, left greater than the right. Kimberly Watson has chronic left lower extremity swelling since her left knee replacement surgery in 2001. Kimberly Watson recalled having profuse diarrhea nonstop for 24 hours leading up to the blood clot event. Of note, prior to the diagnosis, Kimberly Watson was not taking aspirin consistently  Prior to that Kimberly Watson denies recent history of trauma, long distance travel, recent surgery, smoking or prolonged immobilization. Kimberly Watson had no prior history or diagnosis of cancer. Her age appropriate screening programs are up-to-date. Kimberly Watson had prior surgeries before and never had perioperative thromboembolic events. The patient had been exposed to birth control pills and never had thrombotic events. The patient had been pregnant before and denies history of peripartum thromboembolic event or history of recurrent miscarriages. There is no family history of blood clots or miscarriages.  On 08/18/2015, Kimberly Watson presented to the emergency department due to symptoms of severe  weakness, chest pressure, shortness of breath and near syncopal episode. Kimberly Watson had extensive evaluation in the emergency room. D-dimer was elevated at greater than 20.  CT angiogram dated 08/19/2015 show massive saddle pulmonary embolus with evidence of severe right heart strain.  Left lower extremity venous Doppler on 08/19/2015 showed findings consistent with acute deep vein thrombosis involving the left femoral vein, left popliteal vein, left posterior tibial vein and left peroneal vein.  Echocardiogram on 08/19/2015 show preserved ejection fraction but with right ventricular dilatation, moderate tricuspid valvular regurgitation, oscillating density on TV(thought to be blood clot in transit) and moderate to severe increased right systolic pressure/evidence of severe pulmonary hypertension  The patient was admitted with IV heparin anticoagulation therapy and placement of IVC filter on 08/20/2015.  Echocardiogram was repeated on 08/23/2015 which showed persistent elevated pulmonary hypertension. The mass over the right tricuspid valve is no longer visible Kimberly Watson was hospitalized on 08/18/2015 to 08/24/2015. Kimberly Watson was discharged home on Eliquis.  Evista was discontinued. 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 On 09/17/2017, Kimberly Watson started taking hydroxyurea 500 mg daily  REVIEW OF SYSTEMS:   Constitutional: Denies fevers, chills or abnormal weight loss Eyes: Denies blurriness of vision Ears, nose, mouth, throat, and face: Denies mucositis or sore throat Respiratory: Denies cough, dyspnea or wheezes Cardiovascular: Denies palpitation, chest discomfort or lower extremity swelling Gastrointestinal:  Denies nausea, heartburn or change in bowel  habits Skin: Denies abnormal skin rashes Lymphatics: Denies new lymphadenopathy or easy bruising Neurological:Denies numbness, tingling or new weaknesses Behavioral/Psych: Mood is stable, no new changes  All other systems were reviewed with the patient and are negative.  I have reviewed the past medical history, past surgical history, social history and family history with the patient and they are unchanged from previous note.  ALLERGIES:  is allergic to evista [raloxifene] and fosamax [alendronate sodium].  MEDICATIONS:  Current Outpatient Medications  Medication Sig Dispense Refill  . amoxicillin (AMOXIL) 500 MG capsule Take 4 capsules 2 hours before Dental Procedure 12 capsule 1  . apixaban (ELIQUIS) 2.5 MG TABS tablet Take 1 tablet 2 x  /day to prevent Blood clots 180 tablet 3  . calcium carbonate (OS-CAL) 600 MG TABS tablet Take 600 mg by mouth daily with breakfast.     . Cholecalciferol (VITAMIN D PO) Take 5,000 Units by mouth in the morning and at bedtime.     . furosemide (LASIX) 40 MG tablet TAKE 1 TABLET DAILY FOR BLOOD PRESSURE AND FLUID. 90 tablet 1  . hydroxyurea (HYDREA) 500 MG capsule TAKE 1 CAPSULE (500 MG TOTAL) BY MOUTH DAILY. MAY TAKE WITH FOOD TO MINIMIZE GI SIDE EFFECTS. 90 capsule 3  . MAGNESIUM PO Take 500 mg by mouth 2 (two) times daily.     . metoprolol succinate (TOPROL-XL) 100 MG 24 hr tablet TAKE 1 TABLET DAILY FOR BLOOD PRESSURE 90 tablet 3  . OVER THE COUNTER MEDICATION Take 1 tablet by mouth daily as needed (allergies). OTC allergy pill    . polyvinyl alcohol (LIQUIFILM TEARS) 1.4 % ophthalmic solution Place 1 drop into both eyes as needed for dry eyes.    . sodium chloride (OCEAN) 0.65 % SOLN nasal spray Place 1 spray into both nostrils as needed for congestion.     No current facility-administered medications for this visit.  PHYSICAL EXAMINATION: ECOG PERFORMANCE STATUS: 1 - Symptomatic but completely ambulatory  Vitals:   07/01/20 1102  BP: (!)  182/91  Pulse: 66  Resp: 18  Temp: (!) 97.5 F (36.4 C)  SpO2: 93%   Filed Weights   07/01/20 1102  Weight: 174 lb 6.4 oz (79.1 kg)    GENERAL:alert, no distress and comfortable NEURO: alert & oriented x 3 with fluent speech, no focal motor/sensory deficits  LABORATORY DATA:  I have reviewed the data as listed    Component Value Date/Time   NA 141 07/01/2020 1040   K 4.0 07/01/2020 1040   CL 108 07/01/2020 1040   CO2 24 07/01/2020 1040   GLUCOSE 93 07/01/2020 1040   BUN 12 07/01/2020 1040   CREATININE 0.82 07/01/2020 1040   CREATININE 0.91 03/03/2020 1003   CALCIUM 9.6 07/01/2020 1040   PROT 7.2 07/01/2020 1040   ALBUMIN 3.8 07/01/2020 1040   AST 20 07/01/2020 1040   ALT 15 07/01/2020 1040   ALKPHOS 62 07/01/2020 1040   BILITOT 1.4 (H) 07/01/2020 1040   GFRNONAA >60 07/01/2020 1040   GFRNONAA 60 03/03/2020 1003   GFRAA 70 03/03/2020 1003    No results found for: SPEP, UPEP  Lab Results  Component Value Date   WBC 6.5 07/01/2020   NEUTROABS 3.5 07/01/2020   HGB 14.5 07/01/2020   HCT 46.2 (H) 07/01/2020   MCV 101.1 (H) 07/01/2020   PLT 306 07/01/2020      Chemistry      Component Value Date/Time   NA 141 07/01/2020 1040   K 4.0 07/01/2020 1040   CL 108 07/01/2020 1040   CO2 24 07/01/2020 1040   BUN 12 07/01/2020 1040   CREATININE 0.82 07/01/2020 1040   CREATININE 0.91 03/03/2020 1003      Component Value Date/Time   CALCIUM 9.6 07/01/2020 1040   ALKPHOS 62 07/01/2020 1040   AST 20 07/01/2020 1040   ALT 15 07/01/2020 1040   BILITOT 1.4 (H) 07/01/2020 1040

## 2020-07-01 NOTE — Assessment & Plan Note (Signed)
She had history of recurrent thrombosis She is doing well on chronic anticoagulation therapy I recommend she continue the same and indefinitely. So far, she has no bleeding complications She might need to interrupt anticoagulation therapy in anticipation for future cataract surgery I recommend she call me when that is scheduled

## 2020-07-01 NOTE — Assessment & Plan Note (Signed)
She tolerated hydroxyurea very well I plan to see her in 6 months The plan will be to continue indefinitely.  She is warned to call me if she feels ill with infection, stress or planning to have surgery

## 2020-07-01 NOTE — Telephone Encounter (Signed)
Scheduled appts per 12/2 los. Gave pt a print out of AVS.

## 2020-07-01 NOTE — Assessment & Plan Note (Signed)
We discussed the importance of preventive care and reviewed the vaccination programs. She does not have any prior allergic reactions to influenza vaccination. She agrees to proceed with influenza vaccination today and we will administer it today at the clinic.  

## 2020-07-01 NOTE — Assessment & Plan Note (Signed)
Her blood pressure is intermittently elevated There is a component of whitecoat hypertension She will continue blood pressure management through her primary care doctor

## 2020-07-20 DIAGNOSIS — Z1231 Encounter for screening mammogram for malignant neoplasm of breast: Secondary | ICD-10-CM | POA: Diagnosis not present

## 2020-07-20 LAB — HM MAMMOGRAPHY

## 2020-07-21 ENCOUNTER — Encounter: Payer: Self-pay | Admitting: Internal Medicine

## 2020-08-03 NOTE — Progress Notes (Signed)
Medicare wellness  Assessment:   Encounter for Medicare annual wellness exam  Essential hypertension - continue medications, DASH diet, exercise and monitor at home. Call if greater than 130/80.  - CBC with Differential/Platelet - CMP/GFR  Mixed hyperlipidemia -continue medications, check lipids, decrease fatty foods, increase activity.  - Lipid panel - TSH  Other abnormal glucose Recent A1Cs at goal Discussed diet/exercise, weight management  Defer A1C; check CMP  Osteoporosis Continue CA and vitamin D Dr. 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   Myeloproliferative disorder (HCC) Dr. Bertis Ruddy following, good response on hydroxyurea  R knee arthritis Managing with tylenol/rest Dr. Lequita Halt recommended surgery once she is ready  Continue regular exercise for strength and balance  R ear cerumen impaction - stop using Qtips, irrigation used in the office without complications, use OTC drops/oil at home to prevent reoccurence   Future Appointments  Date Time Provider Department Center  09/21/2020  2:30 PM [name], MD [facility] None  10/28/2020  2:00 PM Carlus Pavlov, MD [facility] None  12/30/2020 11:00 AM [facility] None  12/30/2020 11:20 AM Artis Delay, MD [facility] None  03/14/2021 10:00 AM [name], MD [facility] None  08/04/2021  2:00 PM Judd Gaudier, NP  [facility] None     Plan:   During the course of the visit the patient was educated and counseled about appropriate screening and preventive services including:  6 month  Influenza vaccine- declines until next year  Screening electrocardiogram  Screening mammography- yearly  Bone densitometry screening  Colorectal cancer screening  Diabetes screening- today  Glaucoma screening- yearly  Nutrition counseling   Advanced directives:    Subjective:   Kimberly Watson is a 80 y.o. female who presents for Medicare Annual Wellness Visit and 3 month follow up on hypertension, prediabetes, hyperlipidemia, vitamin D def.   She denies falls in the last year, has R knee bone on bone, intermittemtn uses cane at home if needed for  Instability. Avoids driving at night.   She was on evista for osteoporosis, she was taken off due to submassive bilateral saddle PE with right heart strain in 2017. She follows with Dr. Bertis Ruddy, still on elliquis. She was also diagnosed with myeloproliferative disorder, on hydroxyurea with apparent good response.   Now follows with Dr. Elvera Lennox for osteoporosis, doing prolia. Last DEXA 07/2019, T-3.5 She has history of nephrolithiasis, follows with Dr. Vernie Ammons.     GERD doing well with lifestyle modification. She does have hiatal hernia, hx of esophageal stricture, had dilation x 3, last in 2019.   BMI is Body mass index is 30.82 kg/m., she has not been working on diet and exercise. She was doing chair yoga in AM, weights in [place] with sister, got off track during the holidays, plans to restart.  Wt Readings from Last 3 Encounters:  08/04/20 174 lb (78.9 kg)  07/01/20 174 lb 6.4 oz (79.1 kg)  05/20/20 177 lb 9.6 oz (80.6 kg)   Her blood pressure has been controlled  at home, today their BP is BP: 128/84  She has joined a gym, recently has not been going but will start again but she has been traveling a lot and wants to restart it. She denies chest pain,  shortness of breath, dizziness. She has not had any shortness of breath, had pulmonary hypertension on echo due to PE, no symptoms at this time. Continues on elequis 2.5 mg BID.   Her cholesterol is diet controlled.  Her cholesterol is controlled. The cholesterol last visit was:   Lab Results  Component Value Date   CHOL 184 03/03/2020   HDL 56 03/03/2020   LDLCALC 105 (H) 03/03/2020   TRIG 131 03/03/2020   CHOLHDL 3.3 03/03/2020   She has been working on diet and exercise for glucose management, and has no polyuria or polydipsia, no chest pain, dyspnea or TIAs, no numbness, tingling or pain in extremities. Last A1C in the office was:  Lab Results  Component Value Date   HGBA1C 5.4 03/03/2020    Last GFR:  Lab Results  Component Value Date   GFRNONAA >60 07/01/2020   Patient is on Vitamin D supplement.  Lab Results  Component Value Date   VD25OH 84.2 12/31/2019        Medication Review Current Outpatient Medications on File Prior to Visit  Medication Sig Dispense Refill  . amoxicillin (AMOXIL) 500 MG capsule Take 4 capsules 2 hours before Dental Procedure 12 capsule 1  . apixaban (ELIQUIS) 2.5 MG TABS tablet Take 1 tablet 2 x  /day to prevent Blood clots 180 tablet 3  . calcium carbonate (OS-CAL) 600 MG TABS tablet Take 600 mg by mouth daily with breakfast.     . Cholecalciferol (VITAMIN D PO) Take 5,000 Units by mouth in the morning and at bedtime.     . furosemide (LASIX) 40 MG tablet TAKE 1 TABLET DAILY FOR BLOOD PRESSURE AND FLUID. 90 tablet 1  . hydroxyurea (HYDREA) 500 MG capsule TAKE 1 CAPSULE (500 MG TOTAL) BY MOUTH DAILY. MAY TAKE WITH FOOD TO MINIMIZE GI SIDE EFFECTS. 90 capsule 3  . MAGNESIUM PO Take 500 mg by mouth 2 (two) times daily.     . metoprolol succinate (TOPROL-XL) 100 MG 24 hr tablet TAKE 1 TABLET DAILY FOR BLOOD PRESSURE 90 tablet 3  . OVER THE COUNTER MEDICATION Take 1 tablet by mouth daily as needed (allergies). OTC allergy pill    . polyvinyl alcohol  (LIQUIFILM TEARS) 1.4 % ophthalmic solution Place 1 drop into both eyes as needed for dry eyes.    . sodium chloride (OCEAN) 0.65 % SOLN nasal spray Place 1 spray into both nostrils as needed for congestion.     No current facility-administered medications on file prior to visit.    Current Problems (verified) Patient Active Problem List   Diagnosis Date Noted  . Poor vision 10/02/2019  . Abnormal glucose 09/18/2019  . 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 Preventive measure 09/13/2017  . Myeloproliferative disorder (HCC) 08/27/2017  . Hiatal hernia 08/09/2017  . Diverticulosis 08/09/2017  . Osteoarthritis of right knee 08/09/2017  . 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 History of recurrent deep vein thrombosis (DVT)   . Osteoporosis 03/30/2015  . Medication management 03/30/2015  . Essential hypertension 06/18/2013  . Hyperlipidemia, mixed 06/18/2013  . Vitamin D deficiency 06/18/2013  . GERD (gastroesophageal reflux disease) 06/18/2013  . Nephrolithiasis 06/18/2013   Screening Tests Immunization History  Administered Date(s) Administered  . DT (  Pediatric) 03/30/2015  . Fluad Quad(high Dose 65+) 07/01/2020  . Influenza, High Dose Seasonal PF 04/20/2016, 05/02/2017, 05/20/2018  . PFIZER SARS-COV-2 Vaccination 09/13/2019, 10/06/2019, 06/15/2020  . Pneumococcal Conjugate-13 03/25/2014  . Tdap 12/27/2018  . Zoster 12/27/2005    Preventative care: Last colonoscopy: 10/2017, no polyps, diverticulosis, Dr. Leone Payor, no follow up recommended EGD: 10/2017 Last mammogram: 07/20/2020 Last pap smear/pelvic exam: remote declines another DEXA: 07/03/2019 osteoporosis- had (reclast 08,09,11) - following Dr. Elvera Lennox  Prior vaccinations: TD or Tdap: 2016 Influenza: 06/2020 Prevnar 13: 2015 Pneumococcal: 2008  Shingles/Zostavax: 2007 Covid 19: 3/3, 2021, pfizer  Last eye: Dr. Hinda Lenis, last 2021, cataract, planning surgery in  March 2022 Last dental: Dr. Ramiro Harvest, 2021  Names of Other Physician/Practitioners you currently use: 1. [facility]- here for primary care 2. Dr.Hecker, eye doctor, last visit Glasses/contacts, 2019 3. [facility], last visit every 6 months  Patient Care Team: [name], MD as PCP - General (Internal Medicine) Mateo Flow, MD as Consulting Physician (Ophthalmology) Iva Boop, MD as Consulting Physician (Gastroenterology) Ihor Gully, MD (Inactive) as Consulting Physician (Urology) Lavina Hamman, MD as Consulting Physician (Obstetrics and Gynecology)   Allergies Allergies  Allergen Reactions  . Evista [Raloxifene] Other (See Comments)    Blood clots - DVT and Pulmonary Embolism   . Fosamax [Alendronate Sodium]     esophagitis    SURGICAL HISTORY She  has a past surgical history that includes Joint replacement (Left, total knee); Uterine fibroid surgery; Cholecystectomy; IVC FILTER PLACEMENT ([facility] HX) (08/2015); Tonsillectomy; and cyst removed off spine. FAMILY HISTORY Her family history includes Breast cancer in her mother; Congestive Heart Failure in her mother; Diabetes in her mother; Hypertension in her mother; Osteoporosis in her father. SOCIAL HISTORY She  reports that she has never smoked. She has never used smokeless tobacco. She reports that she does not drink alcohol and does not use drugs.  MEDICARE WELLNESS OBJECTIVES: Physical activity: Current Exercise Habits: The patient does not participate in regular exercise at present, Exercise limited by: orthopedic condition(s) Cardiac risk factors: Cardiac Risk Factors include: advanced age (>71men, >28 women);dyslipidemia;hypertension;obesity (BMI >30kg/m2) Depression/mood screen:   Depression screen [facility] 2/9 08/04/2020  Decreased Interest 0  Down, Depressed, Hopeless 0  PHQ - 2 Score 0    ADLs:  In your present state of health, do you have any difficulty  performing the following activities: 08/04/2020 03/02/2020  Hearing? N N  Vision? N N  Difficulty concentrating or making decisions? N N  Walking or climbing stairs? N N  Dressing or bathing? N N  Doing errands, shopping? N N  Some recent data might be hidden    Cognitive Testing  Alert? Yes  Normal Appearance?Yes  Oriented to person? Yes  Place? Yes   Time? Yes  Recall of three objects?  Yes  Can perform simple calculations? Yes  Displays appropriate judgment?Yes  Can read the correct time from a watch face?Yes  EOL planning: Does Patient Have a Medical Advance Directive?: Yes Type of Advance Directive: Healthcare Power of Attorney,Living will Does patient want to make changes to medical advance directive?: No - Patient declined Copy of Healthcare Power of Attorney in Chart?: No - copy requested   Review of Systems  Constitutional: Negative for malaise/fatigue and weight loss.  HENT: Negative for hearing loss and tinnitus.   Eyes: Negative for blurred vision and double vision.  Respiratory: Negative for cough, sputum production, shortness of breath and wheezing.   Cardiovascular: Negative for  chest pain, palpitations, orthopnea, claudication, leg swelling and PND.  Gastrointestinal: Negative for abdominal pain, blood in stool, constipation, diarrhea, heartburn, melena, nausea and vomiting.  Genitourinary: Negative.   Musculoskeletal: Positive for joint pain (R knee, postponing surgery ). Negative for back pain, falls and myalgias.  Skin: Negative for rash.  Neurological: Negative for dizziness, tingling, sensory change, weakness and headaches.  Endo/Heme/Allergies: Negative for polydipsia.  Psychiatric/Behavioral: Negative.  Negative for depression, memory loss, substance abuse and suicidal ideas. The patient is not nervous/anxious and does not have insomnia.   All other systems reviewed and are negative.    Objective:   Blood pressure 128/84, pulse 68, temperature (!) 96.6 F  (35.9 C), height 5\' 3"  (1.6 m), weight 174 lb (78.9 kg), SpO2 95 %. Body mass index is 30.82 kg/m.  General appearance: alert, no distress, WD/WN,  female HEENT: normocephalic, sclerae anicteric, R canal obstructed by hard wax, L clear, L TM pearly, nares patent, no discharge or erythema, pharynx normal Oral cavity: MMM, no lesions Neck: supple, no lymphadenopathy, no thyromegaly, no masses Heart: RRR, normal S1, S2, no murmurs Lungs: CTA bilaterally, no wheezes, rhonchi, or rales Abdomen: +bs, soft, non tender, non distended, no masses, no hepatomegaly, no splenomegaly Musculoskeletal: nontender, no swelling, no obvious deformity,  Extremities: no edema, no cyanosis, no clubbing Pulses: 2+ symmetric, upper and lower extremities, normal cap refill Neurological: alert, oriented x 3, CN2-12 intact, strength normal upper extremities and lower extremities, sensation normal throughout, DTRs 2+ throughout, no cerebellar signs, gait antalgic Psychiatric: normal affect, behavior normal, pleasant    Medicare Attestation I have personally reviewed: The patient's medical and social history Their use of alcohol, tobacco or illicit drugs Their current medications and supplements The patient's functional ability including ADLs,fall risks, home safety risks, cognitive, and hearing and visual impairment Diet and physical activities Evidence for depression or mood disorders  The patient's weight, height, BMI, and visual acuity have been recorded in the chart.  I have made referrals, counseling, and provided education to the patient based on review of the above and I have provided the patient with a written personalized care plan for preventive services.     Izora Ribas, NP   08/04/2020

## 2020-08-04 ENCOUNTER — Encounter: Payer: Self-pay | Admitting: Adult Health

## 2020-08-04 ENCOUNTER — Other Ambulatory Visit: Payer: Self-pay

## 2020-08-04 ENCOUNTER — Ambulatory Visit: Payer: Medicare PPO | Admitting: Adult Health

## 2020-08-04 VITALS — BP 128/84 | HR 68 | Temp 96.6°F | Ht 63.0 in | Wt 174.0 lb

## 2020-08-04 DIAGNOSIS — M810 Age-related osteoporosis without current pathological fracture: Secondary | ICD-10-CM

## 2020-08-04 DIAGNOSIS — M1711 Unilateral primary osteoarthritis, right knee: Secondary | ICD-10-CM | POA: Diagnosis not present

## 2020-08-04 DIAGNOSIS — E559 Vitamin D deficiency, unspecified: Secondary | ICD-10-CM

## 2020-08-04 DIAGNOSIS — Z0000 Encounter for general adult medical examination without abnormal findings: Secondary | ICD-10-CM

## 2020-08-04 DIAGNOSIS — I10 Essential (primary) hypertension: Secondary | ICD-10-CM

## 2020-08-04 DIAGNOSIS — E782 Mixed hyperlipidemia: Secondary | ICD-10-CM

## 2020-08-04 DIAGNOSIS — N200 Calculus of kidney: Secondary | ICD-10-CM

## 2020-08-04 DIAGNOSIS — R6889 Other general symptoms and signs: Secondary | ICD-10-CM | POA: Diagnosis not present

## 2020-08-04 DIAGNOSIS — Z7901 Long term (current) use of anticoagulants: Secondary | ICD-10-CM

## 2020-08-04 DIAGNOSIS — E669 Obesity, unspecified: Secondary | ICD-10-CM

## 2020-08-04 DIAGNOSIS — Z0001 Encounter for general adult medical examination with abnormal findings: Secondary | ICD-10-CM | POA: Diagnosis not present

## 2020-08-04 DIAGNOSIS — Z79899 Other long term (current) drug therapy: Secondary | ICD-10-CM

## 2020-08-04 DIAGNOSIS — I2729 Other secondary pulmonary hypertension: Secondary | ICD-10-CM

## 2020-08-04 DIAGNOSIS — Z86718 Personal history of other venous thrombosis and embolism: Secondary | ICD-10-CM

## 2020-08-04 DIAGNOSIS — K219 Gastro-esophageal reflux disease without esophagitis: Secondary | ICD-10-CM | POA: Diagnosis not present

## 2020-08-04 DIAGNOSIS — H6121 Impacted cerumen, right ear: Secondary | ICD-10-CM | POA: Insufficient documentation

## 2020-08-04 DIAGNOSIS — I2699 Other pulmonary embolism without acute cor pulmonale: Secondary | ICD-10-CM

## 2020-08-04 DIAGNOSIS — K5790 Diverticulosis of intestine, part unspecified, without perforation or abscess without bleeding: Secondary | ICD-10-CM

## 2020-08-04 DIAGNOSIS — R7309 Other abnormal glucose: Secondary | ICD-10-CM | POA: Diagnosis not present

## 2020-08-04 DIAGNOSIS — K449 Diaphragmatic hernia without obstruction or gangrene: Secondary | ICD-10-CM

## 2020-08-04 DIAGNOSIS — D471 Chronic myeloproliferative disease: Secondary | ICD-10-CM | POA: Diagnosis not present

## 2020-08-04 NOTE — Patient Instructions (Addendum)
  Ms. Kimberly Watson , Thank you for taking time to come for your Medicare Wellness Visit. I appreciate your ongoing commitment to your health goals. Please review the following plan we discussed and let me know if I can assist you in the future.   These are the goals we discussed: Goals    . Exercise 150 min/wk Moderate Activity       This is a list of the screening recommended for you and due dates:  Health Maintenance  Topic Date Due  . COVID-19 Vaccine (4 - Booster for [place] series) 12/13/2020  . Mammogram  07/20/2021  . Tetanus Vaccine  12/26/2028  . Flu Shot  Completed  . DEXA scan (bone density measurement)  Completed  . Pneumonia vaccines  Completed     Use a dropper or use a cap to put peroxide, olive oil,mineral oil or canola oil in the effected ear- 2-3 times a week. Let it soak for 20-30 min then you can take a shower or use a baby bulb with warm water to wash out the ear wax.  Can buy debrox wax removal kit over the counter.  Do not use Qtips

## 2020-08-05 LAB — CBC WITH DIFFERENTIAL/PLATELET
Absolute Monocytes: 548 cells/uL (ref 200–950)
Basophils Absolute: 80 cells/uL (ref 0–200)
Basophils Relative: 1.1 %
Eosinophils Absolute: 365 cells/uL (ref 15–500)
Eosinophils Relative: 5 %
HCT: 46 % — ABNORMAL HIGH (ref 35.0–45.0)
Hemoglobin: 15.5 g/dL (ref 11.7–15.5)
Lymphs Abs: 2351 cells/uL (ref 850–3900)
MCH: 32 pg (ref 27.0–33.0)
MCHC: 33.7 g/dL (ref 32.0–36.0)
MCV: 95 fL (ref 80.0–100.0)
MPV: 10.8 fL (ref 7.5–12.5)
Monocytes Relative: 7.5 %
Neutro Abs: 3957 cells/uL (ref 1500–7800)
Neutrophils Relative %: 54.2 %
Platelets: 359 10*3/uL (ref 140–400)
RBC: 4.84 10*6/uL (ref 3.80–5.10)
RDW: 13.4 % (ref 11.0–15.0)
Total Lymphocyte: 32.2 %
WBC: 7.3 10*3/uL (ref 3.8–10.8)

## 2020-08-05 LAB — COMPLETE METABOLIC PANEL WITH GFR
AG Ratio: 1.5 (calc) (ref 1.0–2.5)
ALT: 18 U/L (ref 6–29)
AST: 21 U/L (ref 10–35)
Albumin: 4.4 g/dL (ref 3.6–5.1)
Alkaline phosphatase (APISO): 59 U/L (ref 37–153)
BUN/Creatinine Ratio: 21 (calc) (ref 6–22)
BUN: 19 mg/dL (ref 7–25)
CO2: 26 mmol/L (ref 20–32)
Calcium: 9.9 mg/dL (ref 8.6–10.4)
Chloride: 101 mmol/L (ref 98–110)
Creat: 0.89 mg/dL — ABNORMAL HIGH (ref 0.60–0.88)
GFR, Est African American: 71 mL/min/{1.73_m2} (ref >=60)
GFR, Est Non African American: 61 mL/min/{1.73_m2} (ref >=60)
Globulin: 3 g/dL (calc) (ref 1.9–3.7)
Glucose, Bld: 78 mg/dL (ref 65–99)
Potassium: 4.3 mmol/L (ref 3.5–5.3)
Sodium: 141 mmol/L (ref 135–146)
Total Bilirubin: 1.3 mg/dL — ABNORMAL HIGH (ref 0.2–1.2)
Total Protein: 7.4 g/dL (ref 6.1–8.1)

## 2020-08-05 LAB — LIPID PANEL
Cholesterol: 168 mg/dL (ref <200)
HDL: 61 mg/dL (ref >=50)
LDL Cholesterol (Calc): 85 mg/dL (calc)
Non-HDL Cholesterol (Calc): 107 mg/dL (calc) (ref <130)
Total CHOL/HDL Ratio: 2.8 (calc) (ref <5.0)
Triglycerides: 120 mg/dL (ref <150)

## 2020-08-05 LAB — MAGNESIUM: Magnesium: 2.1 mg/dL (ref 1.5–2.5)

## 2020-08-05 LAB — TSH: TSH: 1.25 mIU/L (ref 0.40–4.50)

## 2020-09-21 ENCOUNTER — Ambulatory Visit: Payer: Medicare PPO | Admitting: Internal Medicine

## 2020-09-22 ENCOUNTER — Other Ambulatory Visit: Payer: Self-pay | Admitting: Hematology and Oncology

## 2020-09-22 DIAGNOSIS — Z7901 Long term (current) use of anticoagulants: Secondary | ICD-10-CM

## 2020-10-27 DIAGNOSIS — H2513 Age-related nuclear cataract, bilateral: Secondary | ICD-10-CM | POA: Diagnosis not present

## 2020-10-27 DIAGNOSIS — H25013 Cortical age-related cataract, bilateral: Secondary | ICD-10-CM | POA: Diagnosis not present

## 2020-10-27 DIAGNOSIS — H524 Presbyopia: Secondary | ICD-10-CM | POA: Diagnosis not present

## 2020-10-27 DIAGNOSIS — H47321 Drusen of optic disc, right eye: Secondary | ICD-10-CM | POA: Diagnosis not present

## 2020-10-27 DIAGNOSIS — H25041 Posterior subcapsular polar age-related cataract, right eye: Secondary | ICD-10-CM | POA: Diagnosis not present

## 2020-10-28 ENCOUNTER — Encounter: Payer: Self-pay | Admitting: Internal Medicine

## 2020-10-28 ENCOUNTER — Ambulatory Visit: Payer: Medicare PPO | Admitting: Internal Medicine

## 2020-10-28 ENCOUNTER — Other Ambulatory Visit: Payer: Self-pay

## 2020-10-28 VITALS — BP 128/78 | HR 70 | Ht 63.0 in | Wt 181.0 lb

## 2020-10-28 DIAGNOSIS — M810 Age-related osteoporosis without current pathological fracture: Secondary | ICD-10-CM | POA: Diagnosis not present

## 2020-10-28 DIAGNOSIS — E673 Hypervitaminosis D: Secondary | ICD-10-CM | POA: Diagnosis not present

## 2020-10-28 NOTE — Patient Instructions (Addendum)
Please continue calcium 600 mg daily and vitamin D 10,000 units daily.   Also, continue Prolia.  Please stop at the lab.  Please come back for a follow-up appointment in 1 year.

## 2020-10-28 NOTE — Progress Notes (Signed)
Patient ID: Kimberly Watson, female   DOB: 1941/01/08, 80 y.o.   MRN: [id number]   This visit occurred during the SARS-CoV-2 public health emergency.  Safety protocols were in place, including screening questions prior to the visit, additional usage of staff PPE, and extensive cleaning of exam room while observing appropriate contact time as indicated for disinfecting solutions.   HPI  Kimberly Watson is a 80 y.o.-year-old female, initially referred by her PCP, Dr. Melford Aase, returning for follow-up for osteoporosis (OP).  Last visit 1 year ago.  Interim history: She had no falls or fractures since last OV.  Reviewed hx: Pt was dx with OP in 1990s -in her late 23s.  I reviewed pt's DXA scan reports: Date L1-L4 T score FN T score 33% distal Radius Ultra distal radius  07/03/2019 ([facility]) n/a RFN: -3.4 LFN: -2.2 -3.5 n/a  05/16/2017 ([facility]) n/a RFN: -2.9 LFN: -2.0 -3.1 n/a  04/09/2013 ([facility]) n/a RFN: -2.2 LFN: -1.8 n/a n/a   Date L1-L4 T score FN T score 33% distal Radius Ultra distal radius  04/27/2015 ([facility]) n/a RFN: -2.0 LFN: -2.2 -2.1 -4.4   She fell 10/2018 >> no fractures.  She needs another knee replacement (R). She had steroid 1x inj. in 2019 in R knee.  No dizziness/vertigo/orthostasis/poor vision. She is scheduled for cataract Sx.  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 Reviewed available vit D levels: Lab Results  Component Value Date   VD25OH 84.2 12/31/2019   VD25OH 90 09/17/2019   VD25OH 78 01/27/2019   VD25OH 94 11/14/2017   VD25OH 46 12/22/2016   VD25OH 81 01/19/2016   VD25OH 46 07/13/2015   VD25OH 43 03/30/2015   Pt is on: - calcium carbonate 600 mg 2x a day >>  1x a day (changed at last visit) - vitamin D 15,000 daily (5000 units 3x a day) >> 10,000 units daily (changed at last visit)  She was exercising at the gym before the Covid pandemic.  She exercises with her sister once a day: Pushups, marching in place, touching toes, handweights.  She does not take high vitamin A doses.  Menopause was at 80 y/o.   FH of osteoporosis: father in his 107s, sisters with osteopenia.  No h/o hyper/hypocalcemia or hyperparathyroidism. No h/o kidney stones. Lab Results  Component Value Date   CALCIUM 9.9 08/04/2020   CALCIUM 9.6 07/01/2020   CALCIUM 9.9 03/03/2020   CALCIUM 10.1 09/17/2019   CALCIUM 10.0 01/27/2019   CALCIUM 9.7 09/17/2018   CALCIUM 10.4 05/20/2018   CALCIUM 10.0 02/13/2018   CALCIUM 9.6 11/14/2017   CALCIUM 9.5 09/25/2017   No h/o thyrotoxicosis. Reviewed TSH recent levels:  Lab Results  Component Value Date   TSH 1.25 08/04/2020   TSH 1.51 03/03/2020   TSH 1.16 09/17/2019   TSH 1.94 01/27/2019   TSH 1.23 05/20/2018   No h/o CKD. Last BUN/Cr: Lab Results  Component Value Date   BUN 19 08/04/2020   CREATININE 0.89 (H) 08/04/2020   ROS: Constitutional: no weight gain, no weight loss, no fatigue, no subjective hyperthermia, no subjective hypothermia, no nocturia Eyes: no blurry vision, no xerophthalmia ENT: no sore throat, no nodules palpated in  neck, no dysphagia, no odynophagia, no hoarseness, no tinnitus, + hypoacusis Cardiovascular: no CP, no SOB, no palpitations, no leg swelling Respiratory: no cough, no SOB, no wheezing Gastrointestinal: no N, no V, no D, no C, no acid reflux Musculoskeletal: no muscle, + joint aches Skin: no rash, no hair loss Neurological: no tremors, no numbness or tingling/no dizziness/no HAs  I reviewed pt's medications, allergies, PMH, social hx, family hx, and changes were documented in the history of present illness. Otherwise, unchanged from my initial visit note.  Past Medical History:   Diagnosis Date  . AKI (acute kidney injury) ([place]) 08/19/2015  . Cataract   . Clotting disorder ([place])   . DVT of lower extremity (deep venous thrombosis) ([place])    left, resolved  . Elevated hemoglobin A1c   . GERD (gastroesophageal reflux disease)   . H/O left knee surgery   . Hypertension   . JAK2 gene mutation   . Leiomyoma    adnexal Leiomyoma removal in 1/11  . 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 Nephrolithiasis   . Osteoporosis   . PE (pulmonary embolism) 08/2015   Past Surgical History:  Procedure Laterality Date  . CHOLECYSTECTOMY    . cyst removed off spine    . IVC FILTER PLACEMENT ([facility] HX)  08/2015  . JOINT REPLACEMENT Left total knee  . TONSILLECTOMY    . UTERINE FIBROID SURGERY     Social History   Socioeconomic History  . Marital status: Divorced    Spouse name: Not on file  . Number of children: 2  . Years of education: Not on file  . Highest education level: Not on file  Occupational History  . Occupation: Pharmacist, hospital    Comment: Retired  Tobacco Use  . Smoking status: Never Smoker  . Smokeless tobacco: Never Used  Vaping Use  . Vaping Use: Never used  Substance and Sexual Activity  . Alcohol use: No  . Drug use: No  . Sexual activity: Not on file    Comment: retired, next of kin, 1 son and daughter.  Other Topics Concern  . Not on file  Social History Narrative   Divorced, 2 children lives alone.  Lived with a smoker for years.    Retired Radio producer   No alcohol tobacco or drug use   10/08/2017      Social Determinants of Health   Financial Resource Strain: Not on file  Food Insecurity: Not on file  Transportation Needs: Not on file  Physical Activity: Not on file  Stress: Not on file  Social Connections: Not on file  Intimate Partner Violence: Not on file   Current Outpatient Medications on File Prior to Visit  Medication Sig Dispense Refill  . amoxicillin (AMOXIL) 500 MG capsule Take 4 capsules 2 hours before Dental  Procedure 12 capsule 1  . calcium carbonate (OS-CAL) 600 MG TABS tablet Take 600 mg by mouth daily with breakfast.     . Cholecalciferol (VITAMIN D PO) Take 5,000 Units by mouth in the morning and at bedtime.     Marland Kitchen ELIQUIS 2.5 MG TABS tablet TAKE 1 TABLET BY MOUTH TWICE A DAY TO PREVENT BLOOD CLOTS 180 tablet 3  . furosemide (LASIX) 40 MG tablet TAKE 1 TABLET DAILY FOR BLOOD PRESSURE AND FLUID. 90 tablet 1  . hydroxyurea (HYDREA) 500 MG capsule TAKE 1 CAPSULE (500 MG TOTAL) BY MOUTH DAILY. MAY TAKE WITH FOOD TO MINIMIZE GI SIDE EFFECTS. 90 capsule 3  .  MAGNESIUM PO Take 500 mg by mouth 2 (two) times daily.     . metoprolol succinate (TOPROL-XL) 100 MG 24 hr tablet TAKE 1 TABLET DAILY FOR BLOOD PRESSURE 90 tablet 3  . OVER THE COUNTER MEDICATION Take 1 tablet by mouth daily as needed (allergies). OTC allergy pill    . polyvinyl alcohol (LIQUIFILM TEARS) 1.4 % ophthalmic solution Place 1 drop into both eyes as needed for dry eyes.    . sodium chloride (OCEAN) 0.65 % SOLN nasal spray Place 1 spray into both nostrils as needed for congestion.     No current facility-administered medications on file prior to visit.   Allergies  Allergen Reactions  . Evista [Raloxifene] Other (See Comments)    Blood clots - DVT and Pulmonary Embolism   . Fosamax [Alendronate Sodium]     esophagitis   Family History  Problem Relation Age of Onset  . Hypertension Mother   . Diabetes Mother   . Breast cancer Mother   . Congestive Heart Failure Mother        Died age 77  . Osteoporosis Father   . Colon cancer Neg Hx   . Colon polyps Neg Hx   . Esophageal cancer Neg Hx   . Rectal cancer Neg Hx   . Stomach cancer Neg Hx    PE: BP 128/78   Pulse 70   Ht 5\' 3"  (1.6 m)   Wt 181 lb (82.1 kg)   SpO2 94%   BMI 32.06 kg/m  Wt Readings from Last 3 Encounters:  10/28/20 181 lb (82.1 kg)  08/04/20 174 lb (78.9 kg)  07/01/20 174 lb 6.4 oz (79.1 kg)   Constitutional: overweight, in NAD.  + Scoliosis and  kyphosis. Eyes: PERRLA, EOMI, no exophthalmos ENT: moist mucous membranes, no thyromegaly, no cervical lymphadenopathy Cardiovascular: RRR, No MRG Respiratory: CTA B Gastrointestinal: abdomen soft, NT, ND, BS+ Musculoskeletal: no deformities except as above, strength intact in all 4 Skin: moist, warm, no rashes Neurological: no tremor with outstretched hands, DTR normal in all 4  Assessment: 1. Osteoporosis  Plan: 1. Osteoporosis -Likely postmenopausal/age-related, and she also has a family history of osteoporosis -Reviewed her latest for DXA scan reports together and they show an increased risk for fracture. -At last visit, I advised her to decrease her calcium to only 600 mg daily but to stop it completely if she felt that she got 1000 to 1200 mg of calcium from the diet.  At this visit she tells me that she is still taking 600 mg daily.  Calcium level was recently normal, at 9.9 on 08/04/2020. -At last visit he was taking a high dose of  vitamin D, 15,000 units daily.  We decreased to 10,000 units daily. -We repeated a vitamin D level in 12/2019 and this was normal, at 84.  -At this visit, we will repeat her vitamin D level -At last visit, we discussed about different medication classes, with benefits and side effects and we decided to start Prolia. -She started Prolia and she tolerates it well, without thigh/hip/jaw pain or other discomfort -We can continue Prolia for at least 6 years, but we can even go to 10 years. - We will check a new DXA scan 2 years after starting Prolia -we will order this at next visit.  We discussed that stable or increasing T-scores are desirable - will see pt back in 1 year  Component     Latest Ref Rng & Units 10/28/2020  Vitamin D, 25-Hydroxy  30.0 - 100.0 ng/mL 109.0 (H)  Vitamin D is elevated.  I will advise her to stop vitamin D for the next 2 weeks and then restart only 5000 units daily. We will then need to repeat the vitamin D level in 2  months.  Philemon Kingdom, MD PhD [facility]

## 2020-10-29 ENCOUNTER — Telehealth: Payer: Self-pay

## 2020-10-29 LAB — VITAMIN D 25 HYDROXY (VIT D DEFICIENCY, FRACTURES): Vit D, 25-Hydroxy: 109 ng/mL — ABNORMAL HIGH (ref 30.0–100.0)

## 2020-10-29 NOTE — Telephone Encounter (Signed)
Pt advised of lab results. Verbalized understanding. Lab appt scheduled for 2 months.

## 2020-10-29 NOTE — Telephone Encounter (Signed)
-----   Message from Philemon Kingdom, MD sent at 10/29/2020  8:38 AM EDT ----- Can you please call pt.:  Her Vitamin D is too high.  Please advise her to stop vitamin D for the next 2 weeks and then restart only 5000 units daily. We will then need to repeat the vitamin D level in 2 months. I ordered this.

## 2020-11-01 ENCOUNTER — Telehealth: Payer: Self-pay

## 2020-11-01 NOTE — Telephone Encounter (Signed)
Patient states that when she woke up this morning there was blood in her stool. Wanting to know what she should do? Cannot come into the office today. Please advise.

## 2020-11-01 NOTE — Telephone Encounter (Signed)
Patient is scheduled for an office visit tomorrow, 11/01/20

## 2020-11-01 NOTE — Telephone Encounter (Signed)
Aleve was taken twice daily on Wednesday thru Sunday. Ate corn dogs and that's when It all started. Has had 4 bowel movements today with stool being very dark, Black ooze is how she refers to this. No blood in stool.

## 2020-11-01 NOTE — Telephone Encounter (Signed)
Left message on voice mail  to call back

## 2020-11-02 ENCOUNTER — Encounter: Payer: Self-pay | Admitting: Adult Health

## 2020-11-02 ENCOUNTER — Other Ambulatory Visit: Payer: Self-pay

## 2020-11-02 ENCOUNTER — Ambulatory Visit: Payer: Medicare PPO | Admitting: Adult Health

## 2020-11-02 VITALS — BP 104/64 | HR 86 | Temp 96.3°F | Wt 174.0 lb

## 2020-11-02 DIAGNOSIS — R195 Other fecal abnormalities: Secondary | ICD-10-CM | POA: Diagnosis not present

## 2020-11-02 DIAGNOSIS — K921 Melena: Secondary | ICD-10-CM | POA: Diagnosis not present

## 2020-11-02 DIAGNOSIS — Z9889 Other specified postprocedural states: Secondary | ICD-10-CM

## 2020-11-02 LAB — POC HEMOCCULT BLD/STL (OFFICE/1-CARD/DIAGNOSTIC): Fecal Occult Blood, POC: POSITIVE — AB

## 2020-11-02 MED ORDER — OMEPRAZOLE 20 MG PO CPDR: DELAYED_RELEASE_CAPSULE | ORAL | 2 refills | Status: DC

## 2020-11-02 NOTE — Progress Notes (Signed)
Assessment and Plan:  Kimberly Watson was seen today for melena.  Diagnoses and all orders for this visit:  Melena/ upper GI bleed ? NSAID gastritis bleeding ulcer in setting of elequis with hx of PE/DVT She as been holding elequis per out instructions since last night - contineu + heme stool today Check labs for blood loss anemia, r/o H. Pylori then start PPI BID per script GERD lifestyle and triggers discussed; AVOID ALL NSAIDS - she expresses understanding, will use tylenol only for pain Contacting GI office to clarify last EGD ? At another office, but planning urgent referral to follow up with them pending lab results tomorrow AM Go to the ER if any chest pain, shortness of breath, dizziness, syncope, worsening sx for possibly urgent GI bleed  -     CBC with Differential/Platelet -     Iron, TIBC and Ferritin Panel -     POC Hemoccult Bld/Stl (1-Cd Office Dx) -     H. pylori breath test -     omeprazole (PRILOSEC) 20 MG capsule; Take 1 cap 30 min prior to breakfast and dinner.  Further disposition pending results of labs. Discussed med's effects and SE's.   Over 30 minutes of exam, counseling, chart review, and critical decision making was performed.   Future Appointments  Date Time Provider [place]  11/17/2020  2:30 PM Unk Pinto, MD [facility] None  12/29/2020 11:30 AM [facility] None  12/30/2020 11:00 AM [facility] None  12/30/2020 11:20 AM [name], MD [facility] None  03/14/2021 10:00 AM Unk Pinto, MD [facility] None  08/04/2021  2:00 PM Liane Comber, NP [facility] None  11/03/2021  2:00 PM Philemon Kingdom, MD [facility] None    ------------------------------------------------------------------------------------------------------------------   HPI BP 104/64   Pulse 86   Temp (!) 96.3 F (35.7 C)   Wt 174 lb (78.9 kg)   SpO2 98%   BMI 30.82 kg/m   80 y.o.female with hx of PE in 2017 on elequis 2.5 mg BID  presents  for evaluation of black stools x 3 days.   She reports 3 days ago in the afternoon starting having episodes of black stools, this AM with coffee ground appearance in liquid. Notably reports took 2 aleve daily on empty stomach for 5 days preceding (has taken intermittently PRN for knee pain with walking).   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 Carlean Purl 06/2020 with colonoscopy, though not report in system, last documented in 2019. Will contact to clarify.   She reports since yesterday feeling somewhat weak/dizzy, feeling chills. Denies dyspnea, chest pain.   She was advised to hold elequis last night (taking 2.5 mg BID; She was on evista for osteoporosis, she was taken off due to submassive bilateral saddle PE with right heart strain in 2017, also hx of DVT. She follows with Dr. Alvy Bimler. She was also diagnosed with myeloproliferative disorder, on hydroxyurea with apparent good response).   Last EGD on record 11/01/2017 mentions gastric polyp (benign), esophageal dilation without other significant findings, no follow up was recommended. Colonoscopy 11/01/2017 showed diverticulosis. No recall recommended.    Past Medical History:  Diagnosis Date  . AKI (acute kidney injury) ([place]) 08/19/2015  . Cataract   . Clotting disorder ([place])   . DVT of lower extremity (deep venous thrombosis) ([place])    left, resolved  . Elevated hemoglobin A1c   . GERD (gastroesophageal reflux  disease)   . H/O left knee surgery   . Hypertension   . JAK2 gene mutation   . Leiomyoma    adnexal Leiomyoma removal in 1/11  . 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 Nephrolithiasis   . Osteoporosis   . PE (pulmonary embolism) 08/2015     Allergies  Allergen Reactions  . Evista [Raloxifene] Other (See Comments)    Blood clots - DVT and Pulmonary Embolism   . Fosamax [Alendronate  Sodium]     esophagitis    Current Outpatient Medications on File Prior to Visit  Medication Sig  . amoxicillin (AMOXIL) 500 MG capsule Take 4 capsules 2 hours before Dental Procedure  . calcium carbonate (OS-CAL) 600 MG TABS tablet Take 600 mg by mouth daily with breakfast.   . Cholecalciferol (VITAMIN D PO) Take 5,000 Units by mouth in the morning and at bedtime.   . furosemide (LASIX) 40 MG tablet TAKE 1 TABLET DAILY FOR BLOOD PRESSURE AND FLUID.  . hydroxyurea (HYDREA) 500 MG capsule TAKE 1 CAPSULE (500 MG TOTAL) BY MOUTH DAILY. MAY TAKE WITH FOOD TO MINIMIZE GI SIDE EFFECTS.  . metoprolol succinate (TOPROL-XL) 100 MG 24 hr tablet TAKE 1 TABLET DAILY FOR BLOOD PRESSURE  . OVER THE COUNTER MEDICATION Take 1 tablet by mouth daily as needed (allergies). OTC allergy pill  . polyvinyl alcohol (LIQUIFILM TEARS) 1.4 % ophthalmic solution Place 1 drop into both eyes as needed for dry eyes.  . sodium chloride (OCEAN) 0.65 % SOLN nasal spray Place 1 spray into both nostrils as needed for congestion.  Marland Kitchen ELIQUIS 2.5 MG TABS tablet TAKE 1 TABLET BY MOUTH TWICE A DAY TO PREVENT BLOOD CLOTS (Patient not taking: Reported on 11/02/2020)  . MAGNESIUM PO Take 500 mg by mouth 2 (two) times daily.  (Patient not taking: Reported on 11/02/2020)   No current facility-administered medications on file prior to visit.    ROS: all negative except above.   Physical Exam:  BP 104/64   Pulse 86   Temp (!) 96.3 F (35.7 C)   Wt 174 lb (78.9 kg)   SpO2 98%   BMI 30.82 kg/m   General Appearance: Well nourished, in no apparent distress. Eyes: PERRLA, EOMs, conjunctiva no swelling or erythema Sinuses: No Frontal/maxillary tenderness ENT/Mouth: Ext aud canals clear, TMs without erythema, bulging. No erythema, swelling, or exudate on post pharynx.  Tonsils not swollen or erythematous. Hearing normal.  Neck: Supple, thyroid normal.  Respiratory: Respiratory effort normal, BS equal bilaterally without rales, rhonchi,  wheezing or stridor.  Cardio: RRR with no MRGs. Brisk peripheral pulses without edema.  Abdomen: Soft, + BS.  Non tender, no guarding, rebound, hernias, masses. Lymphatics: Non tender without lymphadenopathy.  Musculoskeletal: Full ROM, 5/5 strength, normal gait.  Skin: Warm, dry without rashes, lesions, ecchymosis.  Neuro: Cranial nerves intact. Normal muscle tone, no cerebellar symptoms. Sensation intact.  Psych: Awake and oriented X 3, normal affect, Insight and Judgment appropriate.  Rectal exam: negative without mass, lesions or tenderness, sphincter tone normal, stool guaiac positive. Chaperoned by Chancy Hurter, CMA.      Izora Ribas, NP 9:55 AM [facility]

## 2020-11-02 NOTE — Patient Instructions (Addendum)
Please continue to hold eliquis until advised by Dr. Clyda Greener  Please avoid ALL NSAIDS - ibuprofen, aleve, aspirin, meloxicam for now  Tylenol and topicals (voltaren gel, aspercreme)  Stick to bland foods - avoid spicy, fatty, citrus, tomato, caffeine  Start omeprazole twice daily 30 min prior to breakfast and dinner  Pending labs likely will get you in within the next few weeks with Dr. Carlean Purl for further recommendations.   Please go the ED if any passing out, chest pain, shortness of breath or suspected worsening bleeding-    Gastrointestinal Bleeding Gastrointestinal (GI) bleeding is bleeding somewhere along the digestive tract, between the mouth and the anus. This tract includes the mouth, esophagus, stomach, small intestine, large intestine, and anus. The large intestine is often called the colon. GI bleeding can be caused by various problems. The severity of these problems can range from mild to serious or even life-threatening. If you have GI bleeding, you may find blood in your stools (feces), you may have black stools, or you may vomit blood. If there is a lot of bleeding, you may need to stay in the hospital. What are the causes? This condition may be caused by:  Inflammation, irritation, or swelling of the esophagus (esophagitis). The esophagus is part of the body that moves food from your mouth to your stomach.  Swollen veins in the rectum (hemorrhoids).  Areas of painful tearing in the anus that are often caused by passing hard stool (anal fissures).  Pouches that form on the colon over time, with age, and may bleed a lot (diverticulosis).  Inflammation (diverticulitis) in areas with diverticulosis. This can cause pain, fever, and bloody stools, although bleeding may be mild.  Growths (polyps) or cancer. Colon cancer often starts out as precancerous polyps.  Gastritis and ulcers. With these, bleeding may come from the upper GI tract, near the stomach. What  increases the risk? You are more likely to develop this condition if you:  Have an infection in your stomach from a type of bacteria called Helicobacter pylori.  Take certain medicines, such as: ? NSAIDs. ? Aspirin. ? Selective serotonin reuptake inhibitors (SSRIs). ? Steroids. ? Antiplatelet or anticoagulant medicines.  Smoke.  Drink alcohol. What are the signs or symptoms? Common symptoms of this condition include:  Bright red blood in your vomit, or vomit that looks like coffee grounds.  Bloody, black, or tarry stools. ? Bleeding from the lower GI tract will usually cause red or maroon blood in the stools. ? Bleeding from the upper GI tract may cause black, tarry stools that are often stronger smelling than usual. ? In certain cases, if the bleeding is fast enough, the stools may be red.  Pain or cramping in the abdomen. How is this diagnosed? This condition may be diagnosed based on:  Your medical history and a physical exam.  Various tests, such as: ? Blood tests. ? Stool tests. ? X-rays and other imaging tests. ? Esophagogastroduodenoscopy (EGD). In this test, a flexible, lighted tube is used to look at your esophagus, stomach, and small intestine. ? Colonoscopy. In this test, a flexible, lighted tube is used to look at your colon. How is this treated? Treatment for this condition depends on the cause of the bleeding. For example:  For bleeding from the esophagus, stomach, small intestine, or colon, the health care provider doing your EGD or colonoscopy may be able to stop the bleeding as part of the procedure.  Inflammation or infection of the colon can be treated  with medicines.  Certain rectal problems can be treated with creams, suppositories, or warm baths.  Medicines may be given to reduce acid in your stomach.  Surgery is sometimes needed.  Blood transfusions are sometimes needed if a lot of blood has been lost. If bleeding is mild, you may be allowed  to go home. If there is a lot of bleeding, you will need to stay in the hospital for observation. Follow these instructions at home:  Take over-the-counter and prescription medicines only as told by your health care provider.  Eat foods that are high in fiber, such as beans, whole grains, and fresh fruits and vegetables. This will help to keep your stools soft. Eating 1-3 prunes each day works well for many people.  Drink enough fluid to keep your urine pale yellow.  Keep all follow-up visits as told by your health care provider. This is important.   Contact a health care provider if:  Your symptoms do not improve. Get help right away if:  Your bleeding does not stop.  You feel light-headed or you faint.  You feel weak.  You have severe cramps in your back or abdomen.  You pass large blood clots in your stool.  Your symptoms are getting worse.  You have chest pain or fast heartbeats. Summary  Gastrointestinal (GI) bleeding is bleeding somewhere along the digestive tract, between the mouth and anus. GI bleeding can be caused by various problems. The severity of these problems can range from mild to serious or even life-threatening.  Treatment for this condition depends on the cause of the bleeding.  Take over-the-counter and prescription medicines only as told by your health care provider.  Keep all follow-up visits as told by your health care provider. This is important.  Get help right away if your bleeding increases, your symptoms are getting worse, or you have new symptoms. This information is not intended to replace advice given to you by your health care provider. Make sure you discuss any questions you have with your health care provider. Document Revised: 02/27/2018 Document Reviewed: 02/27/2018 Elsevier Patient Education  [place].

## 2020-11-03 ENCOUNTER — Telehealth: Payer: Self-pay | Admitting: Adult Health

## 2020-11-03 ENCOUNTER — Other Ambulatory Visit: Payer: Self-pay | Admitting: Adult Health

## 2020-11-03 DIAGNOSIS — D500 Iron deficiency anemia secondary to blood loss (chronic): Secondary | ICD-10-CM

## 2020-11-03 DIAGNOSIS — K921 Melena: Secondary | ICD-10-CM

## 2020-11-03 DIAGNOSIS — R195 Other fecal abnormalities: Secondary | ICD-10-CM

## 2020-11-03 HISTORY — DX: Iron deficiency anemia secondary to blood loss (chronic): D50.0

## 2020-11-03 LAB — CBC WITH DIFFERENTIAL/PLATELET
Absolute Monocytes: 554 cells/uL (ref 200–950)
Basophils Absolute: 101 cells/uL (ref 0–200)
Basophils Relative: 1.2 %
Eosinophils Absolute: 109 cells/uL (ref 15–500)
Eosinophils Relative: 1.3 %
HCT: 33.9 % — ABNORMAL LOW (ref 35.0–45.0)
Hemoglobin: 11.1 g/dL — ABNORMAL LOW (ref 11.7–15.5)
Lymphs Abs: 2201 cells/uL (ref 850–3900)
MCH: 32 pg (ref 27.0–33.0)
MCHC: 32.7 g/dL (ref 32.0–36.0)
MCV: 97.7 fL (ref 80.0–100.0)
MPV: 10.8 fL (ref 7.5–12.5)
Monocytes Relative: 6.6 %
Neutro Abs: 5435 cells/uL (ref 1500–7800)
Neutrophils Relative %: 64.7 %
Platelets: 391 10*3/uL (ref 140–400)
RBC: 3.47 10*6/uL — ABNORMAL LOW (ref 3.80–5.10)
RDW: 13.1 % (ref 11.0–15.0)
Total Lymphocyte: 26.2 %
WBC: 8.4 10*3/uL (ref 3.8–10.8)

## 2020-11-03 LAB — IRON,TIBC AND FERRITIN PANEL
%SAT: 25 % (calc) (ref 16–45)
Ferritin: 59 ng/mL (ref 16–288)
Iron: 88 ug/dL (ref 45–160)
TIBC: 353 mcg/dL (calc) (ref 250–450)

## 2020-11-03 LAB — H. PYLORI BREATH TEST: H. pylori Breath Test: NOT DETECTED

## 2020-11-03 NOTE — Telephone Encounter (Signed)
Called & spoke to patient to report  lab results, referral placed to GI, labs sent to Dr Alvy Bimler [facility], and scheduled a NV for 11/04/20 to recheck CBC. Patient voiced understanding and will come in tomorrow.

## 2020-11-04 ENCOUNTER — Other Ambulatory Visit: Payer: Self-pay

## 2020-11-04 ENCOUNTER — Ambulatory Visit: Payer: Medicare PPO

## 2020-11-04 DIAGNOSIS — K921 Melena: Secondary | ICD-10-CM

## 2020-11-04 DIAGNOSIS — D500 Iron deficiency anemia secondary to blood loss (chronic): Secondary | ICD-10-CM | POA: Diagnosis not present

## 2020-11-04 LAB — CBC WITH DIFFERENTIAL/PLATELET
Absolute Monocytes: 631 cells/uL (ref 200–950)
Basophils Absolute: 82 cells/uL (ref 0–200)
Basophils Relative: 1 %
Eosinophils Absolute: 213 cells/uL (ref 15–500)
Eosinophils Relative: 2.6 %
HCT: 32.2 % — ABNORMAL LOW (ref 35.0–45.0)
Hemoglobin: 10.7 g/dL — ABNORMAL LOW (ref 11.7–15.5)
Lymphs Abs: 2681 cells/uL (ref 850–3900)
MCH: 32.3 pg (ref 27.0–33.0)
MCHC: 33.2 g/dL (ref 32.0–36.0)
MCV: 97.3 fL (ref 80.0–100.0)
MPV: 10.8 fL (ref 7.5–12.5)
Monocytes Relative: 7.7 %
Neutro Abs: 4592 cells/uL (ref 1500–7800)
Neutrophils Relative %: 56 %
Platelets: 413 10*3/uL — ABNORMAL HIGH (ref 140–400)
RBC: 3.31 10*6/uL — ABNORMAL LOW (ref 3.80–5.10)
RDW: 13.4 % (ref 11.0–15.0)
Total Lymphocyte: 32.7 %
WBC: 8.2 10*3/uL (ref 3.8–10.8)

## 2020-11-04 NOTE — Progress Notes (Signed)
Patient presents to the office for a nurse visit to have a CBC done. Questioned patient about having any blood in her stool and she states that she hasn't had a bowel movement in two days. Will call to let us know if this happens again. States that for the past two days she has cut her Metoprolol in 1/2. Today's reading was 118/72. Patient questioned if she should go back to taking one whole tablet or stay with the 1/2. Per provider, its fine to stay on the 1/2 tablet and monitor her blood pressure daily.

## 2020-11-17 ENCOUNTER — Ambulatory Visit: Payer: Medicare PPO | Admitting: Internal Medicine

## 2020-11-19 ENCOUNTER — Ambulatory Visit: Payer: Medicare PPO | Admitting: Nurse Practitioner

## 2020-11-19 ENCOUNTER — Encounter: Payer: Self-pay | Admitting: Nurse Practitioner

## 2020-11-19 ENCOUNTER — Inpatient Hospital Stay (HOSPITAL_COMMUNITY)
Admission: EM | Admit: 2020-11-19 | Discharge: 2020-11-22 | DRG: 300 | Disposition: A | Discharge status: Home / Self Care | Payer: Medicare PPO | Attending: Internal Medicine | Admitting: Internal Medicine

## 2020-11-19 ENCOUNTER — Other Ambulatory Visit: Payer: Self-pay

## 2020-11-19 ENCOUNTER — Encounter (HOSPITAL_COMMUNITY): Payer: Self-pay | Admitting: *Deleted

## 2020-11-19 ENCOUNTER — Inpatient Hospital Stay (HOSPITAL_COMMUNITY): Payer: Medicare PPO

## 2020-11-19 ENCOUNTER — Emergency Department (HOSPITAL_COMMUNITY): Payer: Medicare PPO

## 2020-11-19 VITALS — BP 142/80 | HR 80 | Ht 62.25 in | Wt 176.5 lb

## 2020-11-19 DIAGNOSIS — Z8262 Family history of osteoporosis: Secondary | ICD-10-CM

## 2020-11-19 DIAGNOSIS — I82409 Acute embolism and thrombosis of unspecified deep veins of unspecified lower extremity: Secondary | ICD-10-CM | POA: Diagnosis present

## 2020-11-19 DIAGNOSIS — K250 Acute gastric ulcer with hemorrhage: Secondary | ICD-10-CM | POA: Diagnosis not present

## 2020-11-19 DIAGNOSIS — Z95828 Presence of other vascular implants and grafts: Secondary | ICD-10-CM | POA: Diagnosis not present

## 2020-11-19 DIAGNOSIS — Z96652 Presence of left artificial knee joint: Secondary | ICD-10-CM | POA: Diagnosis present

## 2020-11-19 DIAGNOSIS — E782 Mixed hyperlipidemia: Secondary | ICD-10-CM | POA: Diagnosis not present

## 2020-11-19 DIAGNOSIS — K219 Gastro-esophageal reflux disease without esophagitis: Secondary | ICD-10-CM | POA: Diagnosis present

## 2020-11-19 DIAGNOSIS — K922 Gastrointestinal hemorrhage, unspecified: Secondary | ICD-10-CM | POA: Diagnosis not present

## 2020-11-19 DIAGNOSIS — Z79899 Other long term (current) drug therapy: Secondary | ICD-10-CM | POA: Diagnosis not present

## 2020-11-19 DIAGNOSIS — T39395A Adverse effect of other nonsteroidal anti-inflammatory drugs [NSAID], initial encounter: Secondary | ICD-10-CM | POA: Diagnosis present

## 2020-11-19 DIAGNOSIS — K921 Melena: Secondary | ICD-10-CM | POA: Diagnosis not present

## 2020-11-19 DIAGNOSIS — K649 Unspecified hemorrhoids: Secondary | ICD-10-CM | POA: Diagnosis not present

## 2020-11-19 DIAGNOSIS — Z20822 Contact with and (suspected) exposure to covid-19: Secondary | ICD-10-CM | POA: Diagnosis not present

## 2020-11-19 DIAGNOSIS — D45 Polycythemia vera: Secondary | ICD-10-CM | POA: Diagnosis present

## 2020-11-19 DIAGNOSIS — Z7901 Long term (current) use of anticoagulants: Secondary | ICD-10-CM

## 2020-11-19 DIAGNOSIS — D62 Acute posthemorrhagic anemia: Secondary | ICD-10-CM | POA: Diagnosis present

## 2020-11-19 DIAGNOSIS — D649 Anemia, unspecified: Secondary | ICD-10-CM | POA: Diagnosis not present

## 2020-11-19 DIAGNOSIS — M810 Age-related osteoporosis without current pathological fracture: Secondary | ICD-10-CM | POA: Diagnosis present

## 2020-11-19 DIAGNOSIS — R0989 Other specified symptoms and signs involving the circulatory and respiratory systems: Secondary | ICD-10-CM

## 2020-11-19 DIAGNOSIS — Z888 Allergy status to other drugs, medicaments and biological substances status: Secondary | ICD-10-CM | POA: Diagnosis not present

## 2020-11-19 DIAGNOSIS — I82402 Acute embolism and thrombosis of unspecified deep veins of left lower extremity: Secondary | ICD-10-CM | POA: Diagnosis not present

## 2020-11-19 DIAGNOSIS — Z86718 Personal history of other venous thrombosis and embolism: Secondary | ICD-10-CM

## 2020-11-19 DIAGNOSIS — D471 Chronic myeloproliferative disease: Secondary | ICD-10-CM | POA: Diagnosis present

## 2020-11-19 DIAGNOSIS — I10 Essential (primary) hypertension: Secondary | ICD-10-CM | POA: Diagnosis present

## 2020-11-19 DIAGNOSIS — I82412 Acute embolism and thrombosis of left femoral vein: Principal | ICD-10-CM

## 2020-11-19 DIAGNOSIS — M7989 Other specified soft tissue disorders: Secondary | ICD-10-CM | POA: Diagnosis not present

## 2020-11-19 DIAGNOSIS — I82432 Acute embolism and thrombosis of left popliteal vein: Secondary | ICD-10-CM | POA: Diagnosis not present

## 2020-11-19 DIAGNOSIS — K254 Chronic or unspecified gastric ulcer with hemorrhage: Secondary | ICD-10-CM | POA: Diagnosis not present

## 2020-11-19 DIAGNOSIS — Z9049 Acquired absence of other specified parts of digestive tract: Secondary | ICD-10-CM

## 2020-11-19 DIAGNOSIS — R531 Weakness: Secondary | ICD-10-CM | POA: Diagnosis not present

## 2020-11-19 DIAGNOSIS — I82492 Acute embolism and thrombosis of other specified deep vein of left lower extremity: Secondary | ICD-10-CM | POA: Diagnosis not present

## 2020-11-19 DIAGNOSIS — Z8249 Family history of ischemic heart disease and other diseases of the circulatory system: Secondary | ICD-10-CM | POA: Diagnosis not present

## 2020-11-19 DIAGNOSIS — K449 Diaphragmatic hernia without obstruction or gangrene: Secondary | ICD-10-CM | POA: Diagnosis not present

## 2020-11-19 DIAGNOSIS — K259 Gastric ulcer, unspecified as acute or chronic, without hemorrhage or perforation: Secondary | ICD-10-CM | POA: Diagnosis present

## 2020-11-19 DIAGNOSIS — E559 Vitamin D deficiency, unspecified: Secondary | ICD-10-CM | POA: Diagnosis not present

## 2020-11-19 HISTORY — PX: IR IVC FILTER PLMT / S&I /IMG GUID/MOD SED: IMG701

## 2020-11-19 LAB — CBC WITH DIFFERENTIAL/PLATELET
Abs Immature Granulocytes: 0.04 10*3/uL (ref 0.00–0.07)
Basophils Absolute: 0.1 10*3/uL (ref 0.0–0.1)
Basophils Relative: 1 %
Eosinophils Absolute: 0.2 10*3/uL (ref 0.0–0.5)
Eosinophils Relative: 2 %
HCT: 39.8 % (ref 36.0–46.0)
Hemoglobin: 12.2 g/dL (ref 12.0–15.0)
Immature Granulocytes: 1 %
Lymphocytes Relative: 22 %
Lymphs Abs: 1.8 10*3/uL (ref 0.7–4.0)
MCH: 31.5 pg (ref 26.0–34.0)
MCHC: 30.7 g/dL (ref 30.0–36.0)
MCV: 102.8 fL — ABNORMAL HIGH (ref 80.0–100.0)
Monocytes Absolute: 0.8 10*3/uL (ref 0.1–1.0)
Monocytes Relative: 10 %
Neutro Abs: 5.3 10*3/uL (ref 1.7–7.7)
Neutrophils Relative %: 64 %
Platelets: 252 10*3/uL (ref 150–400)
RBC: 3.87 MIL/uL (ref 3.87–5.11)
RDW: 14.1 % (ref 11.5–15.5)
WBC: 8.2 10*3/uL (ref 4.0–10.5)
nRBC: 0 % (ref 0.0–0.2)

## 2020-11-19 LAB — COMPREHENSIVE METABOLIC PANEL
ALT: 12 U/L (ref 0–44)
AST: 17 U/L (ref 15–41)
Albumin: 4 g/dL (ref 3.5–5.0)
Alkaline Phosphatase: 62 U/L (ref 38–126)
Anion gap: 10 (ref 5–15)
BUN: 26 mg/dL — ABNORMAL HIGH (ref 8–23)
CO2: 25 mmol/L (ref 22–32)
Calcium: 9.9 mg/dL (ref 8.9–10.3)
Chloride: 109 mmol/L (ref 98–111)
Creatinine, Ser: 0.97 mg/dL (ref 0.44–1.00)
GFR, Estimated: 59 mL/min — ABNORMAL LOW (ref >60)
Glucose, Bld: 97 mg/dL (ref 70–99)
Potassium: 4.1 mmol/L (ref 3.5–5.1)
Sodium: 144 mmol/L (ref 135–145)
Total Bilirubin: 1.1 mg/dL (ref 0.3–1.2)
Total Protein: 7.7 g/dL (ref 6.5–8.1)

## 2020-11-19 LAB — RESP PANEL BY RT-PCR (FLU A&B, COVID) ARPGX2
Influenza A by PCR: NEGATIVE
Influenza B by PCR: NEGATIVE
SARS Coronavirus 2 by RT PCR: NEGATIVE

## 2020-11-19 MED ORDER — MIDAZOLAM HCL 2 MG/2ML IJ SOLN
INTRAMUSCULAR | Status: AC | PRN
  Administered 2020-11-19: 1 mg via INTRAVENOUS

## 2020-11-19 MED ORDER — FENTANYL CITRATE (PF) 100 MCG/2ML IJ SOLN
INTRAMUSCULAR | Status: AC | PRN
  Administered 2020-11-19: 50 ug via INTRAVENOUS

## 2020-11-19 MED ORDER — LIDOCAINE HCL 1 % IJ SOLN
INTRAMUSCULAR | Status: AC
  Filled 2020-11-19: qty 20

## 2020-11-19 MED ORDER — LIDOCAINE HCL (PF) 1 % IJ SOLN
INTRAMUSCULAR | Status: AC | PRN
  Administered 2020-11-19: 5 mL via INTRADERMAL

## 2020-11-19 MED ORDER — MIDAZOLAM HCL 2 MG/2ML IJ SOLN
INTRAMUSCULAR | Status: AC
  Filled 2020-11-19: qty 2

## 2020-11-19 MED ORDER — IOHEXOL 300 MG/ML  SOLN
100.0000 mL | Freq: Once | INTRAMUSCULAR | Status: AC | PRN
  Administered 2020-11-19: 30 mL via INTRAVENOUS

## 2020-11-19 MED ORDER — FENTANYL CITRATE (PF) 100 MCG/2ML IJ SOLN
INTRAMUSCULAR | Status: AC
  Filled 2020-11-19: qty 2

## 2020-11-19 MED ORDER — PANTOPRAZOLE SODIUM 40 MG IV SOLR
40.0000 mg | Freq: Two times a day (BID) | INTRAVENOUS | Status: DC
  Administered 2020-11-19 (×2): 40 mg via INTRAVENOUS
  Filled 2020-11-19 (×2): qty 40

## 2020-11-19 NOTE — Progress Notes (Signed)
Bilateral lower extremity venous duplex has been completed. Preliminary results can be found in CV Proc through chart review.  Results were given to Kimberly Sails PA.  11/19/20 12:17 PM Kimberly Watson RVT

## 2020-11-19 NOTE — Consult Note (Signed)
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  On review of the previous records Kimberly Watson was last seen on 11/02/2020 with signs and symptoms concerning for a lower GI bleed.  She was noted to have melena and at that time she had H. pylori test with a PPI BID started.  Her stool was noted to be heme positive that time and her symptoms were thought to be secondary to NSAID use.  Her chronic Eliquis therapy was held in the setting of her hemoglobin dropping down to approximately 10.7.  Unfortunately today the position presented to the emergency department with left lower extremity swelling and was found to have an acute DVT thrombosis involving the left femoral vein, left upper teal vein, and left gastrocnemius veins.  Due to concern for the DVT in the setting of GI bleed the hematology consult service was called.  On exam today Kimberly Watson reports that she is not having any pain in her left lower extremity.  She does endorse having some swelling and some heaviness in her leg.  She notes that she is also not having any stomach upset or other GI symptoms.  She denies any overt signs of active GI bleed.  She otherwise denies any fevers,  chills, sweats, nausea, vomiting or diarrhea.  A full 10 point ROS is listed below.  MEDICAL HISTORY:  Past Medical History:  Diagnosis Date  . AKI (acute kidney injury) ([place]) 08/19/2015  . Cataract   . Clotting disorder ([place])   . DVT of lower extremity (deep venous thrombosis) ([place])    left, resolved  . Elevated hemoglobin A1c   . GERD (gastroesophageal reflux disease)   . H/O left knee surgery   . Hypertension   . JAK2 gene mutation   . Leiomyoma    adnexal Leiomyoma removal in 1/11  . 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 Nephrolithiasis   . Osteoporosis   . PE (pulmonary embolism) 08/2015    SURGICAL HISTORY: Past Surgical History:  Procedure Laterality Date  . CHOLECYSTECTOMY    . cyst removed off spine    . IVC FILTER PLACEMENT ([facility] HX)  08/2015  . JOINT REPLACEMENT Left total knee  . TONSILLECTOMY    . UTERINE FIBROID SURGERY      SOCIAL HISTORY: Social History   Socioeconomic History  . Marital status: Divorced    Spouse name: Not on file  . Number of children: 2  . Years of education: Not on file  . Highest education level: Not on file  Occupational History  . Occupation: Pharmacist, hospital    Comment: Retired  Tobacco Use  . Smoking status: Never Smoker  . Smokeless tobacco: Never Used  Vaping Use  . Vaping Use: Never used  Substance and Sexual Activity  . Alcohol  use: No  . Drug use: No  . Sexual activity: Not on file    Comment: retired, next of kin, 1 son and daughter.  Other Topics Concern  . Not on file  Social History Narrative   Divorced, 2 children lives alone.  Lived with a smoker for years.    Retired Radio producer   No alcohol tobacco or drug use   10/08/2017      Social Determinants of Health   Financial Resource Strain: Not on file  Food Insecurity: Not on file  Transportation Needs: Not on file  Physical Activity: Not on file  Stress: Not on file  Social Connections: Not on file  Intimate Partner Violence: Not on file     FAMILY HISTORY: Family History  Problem Relation Age of Onset  . Hypertension Mother   . Diabetes Mother   . Breast cancer Mother   . Congestive Heart Failure Mother        Died age 103  . Osteoporosis Father   . Colon cancer Neg Hx   . Colon polyps Neg Hx   . Esophageal cancer Neg Hx   . Rectal cancer Neg Hx   . Stomach cancer Neg Hx     ALLERGIES:  is allergic to evista [raloxifene] and fosamax [alendronate sodium].  MEDICATIONS:  Current Facility-Administered Medications  Medication Dose Route Frequency Provider Last Rate Last Admin  . pantoprazole (PROTONIX) injection 40 mg  40 mg Intravenous Q12H Levin Erp, [place]       Current Outpatient Medications  Medication Sig Dispense Refill  . amoxicillin (AMOXIL) 500 MG capsule Take 4 capsules 2 hours before Dental Procedure 12 capsule 1  . Ascorbic Acid (VITAMIN C PO) Take 1 tablet by mouth daily.    . calcium carbonate (OS-CAL) 600 MG TABS tablet Take 600 mg by mouth daily with breakfast.     . Cholecalciferol (DIALYVITE VITAMIN D 5000) 125 MCG (5000 UT) capsule Take 5,000 Units by mouth daily.    . ferrous sulfate 325 (65 FE) MG tablet Take 325 mg by mouth daily with breakfast.    . furosemide (LASIX) 40 MG tablet TAKE 1 TABLET DAILY FOR BLOOD PRESSURE AND FLUID. (Patient taking differently: Take 40 mg by mouth daily.) 90 tablet 1  . hydroxyurea (HYDREA) 500 MG capsule TAKE 1 CAPSULE (500 MG TOTAL) BY MOUTH DAILY. MAY TAKE WITH FOOD TO MINIMIZE GI SIDE EFFECTS. 90 capsule 3  . MAGNESIUM PO Take 500 mg by mouth daily.    . metoprolol succinate (TOPROL-XL) 100 MG 24 hr tablet TAKE 1 TABLET DAILY FOR BLOOD PRESSURE (Patient taking differently: Take 50 mg by mouth daily.) 90 tablet 3  . omeprazole (PRILOSEC) 20 MG capsule Take 1 cap 30 min prior to breakfast and dinner. (Patient taking differently: Take 20 mg by mouth 2 (two) times daily before a meal.) 60 capsule 2  . polyvinyl alcohol (LIQUIFILM TEARS) 1.4 % ophthalmic  solution Place 1 drop into both eyes as needed for dry eyes.    . sodium chloride (OCEAN) 0.65 % SOLN nasal spray Place 1 spray into both nostrils as needed for congestion.    Marland Kitchen ELIQUIS 2.5 MG TABS tablet TAKE 1 TABLET BY MOUTH TWICE A DAY TO PREVENT BLOOD CLOTS (Patient not taking: No sig reported) 180 tablet 3    REVIEW OF SYSTEMS:   Constitutional: ( - ) fevers, ( - )  chills , ( - ) night sweats Eyes: ( - ) blurriness of vision, ( - )  double vision, ( - ) watery eyes Ears, nose, mouth, throat, and face: ( - ) mucositis, ( - ) sore throat Respiratory: ( - ) cough, ( - ) dyspnea, ( - ) wheezes Cardiovascular: ( - ) palpitation, ( - ) chest discomfort, ( - ) lower extremity swelling Gastrointestinal:  ( - ) nausea, ( - ) heartburn, ( - ) change in bowel habits Skin: ( - ) abnormal skin rashes Lymphatics: ( - ) new lymphadenopathy, ( - ) easy bruising Neurological: ( - ) numbness, ( - ) tingling, ( - ) new weaknesses Behavioral/Psych: ( - ) mood change, ( - ) new changes  All other systems were reviewed with the patient and are negative.  PHYSICAL EXAMINATION:  Vitals:   11/19/20 1300 11/19/20 1400  BP: 129/90 (!) 158/99  Pulse: 66 66  Resp: 15 16  Temp:    SpO2: 97% 98%   Filed Weights   11/19/20 1044  Weight: 176 lb (79.8 kg)    GENERAL: well appearing elderly Caucasian female in NAD  SKIN: skin color, texture, turgor are normal, no rashes or significant lesions EYES: conjunctiva are pink and non-injected, sclera clear LUNGS: clear to auscultation and percussion with normal breathing effort HEART: regular rate & rhythm and no murmurs and no lower extremity edema Musculoskeletal: no cyanosis of digits and no clubbing  PSYCH: alert & oriented x 3, fluent speech NEURO: no focal motor/sensory deficits  LABORATORY DATA:  I have reviewed the data as listed CBC Latest Ref Rng & Units 11/19/2020 11/04/2020 11/02/2020  WBC 4.0 - 10.5 K/uL 8.2 8.2 8.4  Hemoglobin 12.0 - 15.0 g/dL  12.2 10.7(L) 11.1(L)  Hematocrit 36.0 - 46.0 % 39.8 32.2(L) 33.9(L)  Platelets 150 - 400 K/uL 252 413(H) 391    CMP Latest Ref Rng & Units 11/19/2020 08/04/2020 07/01/2020  Glucose 70 - 99 mg/dL 97 78 93  BUN 8 - 23 mg/dL 26(H) 19 12  Creatinine 0.44 - 1.00 mg/dL 0.97 0.89(H) 0.82  Sodium 135 - 145 mmol/L 144 141 141  Potassium 3.5 - 5.1 mmol/L 4.1 4.3 4.0  Chloride 98 - 111 mmol/L 109 101 108  CO2 22 - 32 mmol/L 25 26 24   Calcium 8.9 - 10.3 mg/dL 9.9 9.9 9.6  Total Protein 6.5 - 8.1 g/dL 7.7 7.4 7.2  Total Bilirubin 0.3 - 1.2 mg/dL 1.1 1.3(H) 1.4(H)  Alkaline Phos 38 - 126 U/L 62 - 62  AST 15 - 41 U/L 17 21 20   ALT 0 - 44 U/L 12 18 15    RADIOGRAPHIC STUDIES: VAS [place] LOWER EXTREMITY VENOUS (DVT) (ONLY [facility] & [facility] 7a-7p)  Result Date: 11/19/2020  Lower Venous DVT Study Patient Name:  Kimberly Watson  Date of Exam:   11/19/2020 Medical Rec #: [id number]         Accession #:    [id number] Date of Birth: 1940-11-27         Patient Gender: F Patient Age:   79Y Exam Location:  [facility] Procedure:      VAS [place] LOWER EXTREMITY VENOUS (DVT) Referring Phys: [id number] CLAUDIA J GIBBONS --------------------------------------------------------------------------------  Indications: Swelling.  Risk Factors: None identified. Limitations: Poor ultrasound/tissue interface. Comparison Study: No prior studies. Performing Technologist: Oliver Hum RVT  Examination Guidelines: A complete evaluation includes B-mode imaging, spectral Doppler, color Doppler, and power Doppler as needed of all accessible portions of each vessel. Bilateral testing is considered an integral part of a complete examination. Limited examinations for reoccurring indications may be  performed as noted. The reflux portion of the exam is performed with the patient in reverse Trendelenburg.  +---------+---------------+---------+-----------+----------+-------------------+ RIGHT    CompressibilityPhasicitySpontaneityPropertiesThrombus  Aging      +---------+---------------+---------+-----------+----------+-------------------+ CFV      Full           Yes      Yes                                      +---------+---------------+---------+-----------+----------+-------------------+ SFJ      Full                                                             +---------+---------------+---------+-----------+----------+-------------------+ FV Prox  Full                                                             +---------+---------------+---------+-----------+----------+-------------------+ FV Mid   Full                                                             +---------+---------------+---------+-----------+----------+-------------------+ FV DistalFull                                                             +---------+---------------+---------+-----------+----------+-------------------+ PFV      Full                                                             +---------+---------------+---------+-----------+----------+-------------------+ POP      Full           Yes      Yes                                      +---------+---------------+---------+-----------+----------+-------------------+ PTV      Full                                                             +---------+---------------+---------+-----------+----------+-------------------+ PERO  Not well visualized +---------+---------------+---------+-----------+----------+-------------------+   +---------+---------------+---------+-----------+----------+-------------------+ LEFT     CompressibilityPhasicitySpontaneityPropertiesThrombus Aging      +---------+---------------+---------+-----------+----------+-------------------+ CFV      Full           Yes      Yes                                       +---------+---------------+---------+-----------+----------+-------------------+ SFJ      Full                                                             +---------+---------------+---------+-----------+----------+-------------------+ FV Prox  None           No       No                   Acute               +---------+---------------+---------+-----------+----------+-------------------+ FV Mid   None           No       No                   Acute               +---------+---------------+---------+-----------+----------+-------------------+ FV DistalNone           No       No                   Acute               +---------+---------------+---------+-----------+----------+-------------------+ PFV      Full                                                             +---------+---------------+---------+-----------+----------+-------------------+ POP      None           No       No                   Acute               +---------+---------------+---------+-----------+----------+-------------------+ PTV      Full                                                             +---------+---------------+---------+-----------+----------+-------------------+ PERO                                                  Not well visualized +---------+---------------+---------+-----------+----------+-------------------+    Summary: RIGHT: - There is no evidence of deep vein thrombosis in the lower extremity.  - No cystic structure found in the popliteal fossa.  LEFT: - Findings consistent  with acute deep vein thrombosis involving the left femoral vein, left popliteal vein, and left gastrocnemius veins. - No cystic structure found in the popliteal fossa.  *See table(s) above for measurements and observations.    Preliminary     ASSESSMENT & PLAN Kimberly Watson 80 y.o. 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  #Acute LLE DVT in the Setting of GI Bleeding -- This is a difficult situation that requires conversation with the patient about the risks and benefits of all treatment options moving forward.  I spoke with the patient and her daughter via telephone about all the available options. -- At this time we are agreeable that the safest path forward to be to have an IVC filter placed with GI evaluation and EGD to occur tomorrow. -- Recommend holding anticoagulation until after it is clear there is no active GI bleeding.  We will keep the filter in place until the patient has been safely started back up on anticoagulation and been shown to tolerate it.  This will likely take place in the outpatient setting. -- Fortunately hemoglobin has been improving on exam and was up over 12 today (it was as low as 10.7 during the initial episode of GI bleed) -- Hematology will continue to follow while the patient is in house.   All questions were answered. The patient knows to call the clinic with any problems, questions or concerns.  A total of more than 80 minutes were spent on this encounter and over half of that time was spent on counseling and coordination of care as outlined above.   Ledell Peoples, MD Department of Hematology/Oncology [place] [facility] Phone: [phone] Pager: [phone] Email: [email or web address]  11/19/2020 2:16 PM

## 2020-11-19 NOTE — ED Provider Notes (Signed)
[facility] DEPT Provider Note   CSN: [id number] Arrival date & time: 11/19/20  1037     History Chief Complaint  Patient presents with  . Leg Swelling    Kimberly Watson is a 80 y.o. female with history of myeloproliferative disorder, PE and DVT on Eliquis, GERD presents to the ED for evaluation of left leg swelling and pain.  Onset 1 week ago.  Left worse than right.  Patient was sent to the ED by gastroenterology who saw her earlier this morning.  She was there because earlier this month she noticed black stools.  Her stools tested positive for blood.  She was at GI to schedule an endoscopy to evaluate if she had any GI bleeding.  States a few days prior to having black stools she used Advil daily for 4 days.  She developed epigastric, substernal abdominal discomfort which was then followed by black stools prompting GI evaluation.  She spoke to her hematologist Dr. Alvy Bimler who felt it was appropriate to discontinue her Eliquis 3 weeks ago.  States she had noticed some leg swelling bilaterally since she stopped her Eliquis but she thought it was related to retaining fluid.  She is on Lasix 40 mg for fluid retention daily.  Over the last week her left leg has been swelling more.  She denies any chest pain.  Was feeling a little short of breath before arriving to the ED but she attributes this to being nervous about having another blood clot.  No longer having any shortness of breath.  Denies fevers, cough, hemoptysis.  States her stools are now normal, brown. No longer having abdominal discomfort.  HPI     Past Medical History:  Diagnosis Date  . AKI (acute kidney injury) ([place]) 08/19/2015  . Cataract   . Clotting disorder ([place])   . DVT of lower extremity (deep venous thrombosis) ([place])    left, resolved  . Elevated hemoglobin A1c   . GERD (gastroesophageal reflux disease)   . H/O left knee surgery   . Hypertension   . JAK2 gene mutation   . Leiomyoma     adnexal Leiomyoma removal in 1/11  . Myeloproliferative disorder ([place])    ? P vera  . Nephrolithiasis   . Osteoporosis   . PE (pulmonary embolism) 08/2015    Patient Active Problem List   Diagnosis Date Noted  . Anemia due to gastrointestinal blood loss 11/03/2020  . Melena 11/02/2020  . History of esophageal dilatation 11/02/2020  . Heme positive stool 11/02/2020  . Impacted cerumen of right ear 08/04/2020  . Poor vision 10/02/2019  . Abnormal glucose 09/18/2019  . 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 Preventive measure 09/13/2017  . Myeloproliferative disorder ([place]) 08/27/2017  . Hiatal hernia 08/09/2017  . Diverticulosis 08/09/2017  . Osteoarthritis of right knee 08/09/2017  . 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 History of recurrent deep vein thrombosis (DVT)   . Osteoporosis 03/30/2015  . Medication management 03/30/2015  . Essential hypertension 06/18/2013  . Hyperlipidemia, mixed 06/18/2013  . Vitamin D deficiency 06/18/2013  . GERD (gastroesophageal reflux disease) 06/18/2013  . Nephrolithiasis 06/18/2013    Past Surgical History:  Procedure Laterality Date  . CHOLECYSTECTOMY    . cyst removed off spine    . IVC FILTER PLACEMENT ([facility] HX)  08/2015  . JOINT REPLACEMENT Left total knee  . TONSILLECTOMY    . UTERINE FIBROID SURGERY       OB  History   No obstetric history on file.     Family History  Problem Relation Age of Onset  . Hypertension Mother   . Diabetes Mother   . Breast cancer Mother   . Congestive Heart Failure Mother        Died age 68  . Osteoporosis Father   . Colon cancer Neg Hx   . Colon polyps Neg Hx   . Esophageal cancer Neg Hx   . Rectal cancer Neg Hx   . Stomach cancer Neg Hx     Social History   Tobacco Use  . Smoking status: Never Smoker  . Smokeless tobacco: Never Used  Vaping Use  . Vaping Use: Never used  Substance Use Topics  . Alcohol use: No  . Drug use: No     Home Medications Prior to Admission medications   Medication Sig Start Date End Date Taking? Authorizing Provider  amoxicillin (AMOXIL) 500 MG capsule Take 4 capsules 2 hours before Dental Procedure 12/31/17  Yes Unk Pinto, MD  Ascorbic Acid (VITAMIN C PO) Take 1 tablet by mouth daily.   Yes [provider]  calcium carbonate (OS-CAL) 600 MG TABS tablet Take 600 mg by mouth daily with breakfast.    Yes [provider]  Cholecalciferol (DIALYVITE VITAMIN D 5000) 125 MCG (5000 UT) capsule Take 5,000 Units by mouth daily.   Yes [provider]  ferrous sulfate 325 (65 FE) MG tablet Take 325 mg by mouth daily with breakfast.   Yes [provider]  furosemide (LASIX) 40 MG tablet TAKE 1 TABLET DAILY FOR BLOOD PRESSURE AND FLUID. Patient taking differently: Take 40 mg by mouth daily. 03/24/20  Yes Liane Comber, NP  hydroxyurea (HYDREA) 500 MG capsule TAKE 1 CAPSULE (500 MG TOTAL) BY MOUTH DAILY. MAY TAKE WITH FOOD TO MINIMIZE GI SIDE EFFECTS. 12/22/19  Yes Gorsuch, Ni, MD  MAGNESIUM PO Take 500 mg by mouth daily.   Yes [provider]  metoprolol succinate (TOPROL-XL) 100 MG 24 hr tablet TAKE 1 TABLET DAILY FOR BLOOD PRESSURE Patient taking differently: Take 50 mg by mouth daily. 06/23/20  Yes Unk Pinto, MD  omeprazole (PRILOSEC) 20 MG capsule Take 1 cap 30 min prior to breakfast and dinner. Patient taking differently: Take 20 mg by mouth 2 (two) times daily before a meal. 11/02/20  Yes Liane Comber, NP  polyvinyl alcohol (LIQUIFILM TEARS) 1.4 % ophthalmic solution Place 1 drop into both eyes as needed for dry eyes.   Yes [provider]  sodium chloride (OCEAN) 0.65 % SOLN nasal spray Place 1 spray into both nostrils as needed for congestion.   Yes [provider]  ELIQUIS 2.5 MG TABS tablet TAKE 1 TABLET BY MOUTH TWICE A DAY TO PREVENT BLOOD CLOTS Patient not taking: No sig reported 09/22/20   [name], MD     Allergies    Evista [raloxifene] and Fosamax [alendronate sodium]  Review of Systems   Review of Systems  Cardiovascular: Positive for leg swelling.  All other systems reviewed and are negative.   Physical Exam Updated Vital Signs BP (!) 158/99 (BP Location: Left Arm)   Pulse 66   Temp 97.8 F (36.6 C) (Oral)   Resp 16   Ht 5\' 3"  (1.6 m)   Wt 79.8 kg   SpO2 98%   BMI 31.18 kg/m   Physical Exam Vitals and nursing note reviewed.  Constitutional:      Appearance: She is well-developed.     Comments:  Non toxic in NAD  HENT:     Head: Normocephalic and atraumatic.     Nose: Nose normal.  Eyes:     Conjunctiva/sclera: Conjunctivae normal.  Cardiovascular:     Rate and Rhythm: Normal rate and regular rhythm.     Pulses:          Radial pulses are 1+ on the right side and 1+ on the left side.     Comments: 1+ pitting edema pretibial, bilateral L>R. Both calves tender.  Minimal erythema pretibial, no warmth.  Pulmonary:     Effort: Pulmonary effort is normal.     Breath sounds: Normal breath sounds.  Abdominal:     General: Bowel sounds are normal.     Palpations: Abdomen is soft.     Tenderness: There is no abdominal tenderness.     Comments: No G/R/R. No suprapubic or CVA tenderness. Negative Murphy's and McBurney's. Active BS to lower quadrants.   Musculoskeletal:        General: Normal range of motion.     Cervical back: Normal range of motion.  Skin:    General: Skin is warm and dry.     Capillary Refill: Capillary refill takes less than 2 seconds.  Neurological:     Mental Status: She is alert.  Psychiatric:        Behavior: Behavior normal.       ED Results / Procedures / Treatments   Labs (all labs ordered are listed, but only abnormal results are displayed) Labs Reviewed  CBC WITH DIFFERENTIAL/PLATELET - Abnormal; Notable for the following components:      Result Value   MCV 102.8 (*)    All other components within normal limits  COMPREHENSIVE  METABOLIC PANEL - Abnormal; Notable for the following components:   BUN 26 (*)    GFR, Estimated 59 (*)    All other components within normal limits    EKG None  Radiology VAS [place] LOWER EXTREMITY VENOUS (DVT) (ONLY [facility] & [facility] 7a-7p)  Result Date: 11/19/2020  Lower Venous DVT Study Patient Name:  NASIYA ELVIR  Date of Exam:   11/19/2020 Medical Rec #: [id number]         Accession #:    [id number] Date of Birth: [birth year]         Patient Gender: F Patient Age:   79Y Exam Location:  [facility] Procedure:      VAS [place] LOWER EXTREMITY VENOUS (DVT) Referring Phys: [id number] Saphyra [employer] --------------------------------------------------------------------------------  Indications: Swelling.  Risk Factors: None identified. Limitations: Poor ultrasound/tissue interface. Comparison Study: No prior studies. Performing Technologist: Oliver Hum RVT  Examination Guidelines: A complete evaluation includes B-mode imaging, spectral Doppler, color Doppler, and power Doppler as needed of all accessible portions of each vessel. Bilateral testing is considered an integral part of a complete examination. Limited examinations for reoccurring indications may be performed as noted. The reflux portion of the exam is performed with the patient in reverse Trendelenburg.  +---------+---------------+---------+-----------+----------+-------------------+ RIGHT    CompressibilityPhasicitySpontaneityPropertiesThrombus Aging      +---------+---------------+---------+-----------+----------+-------------------+ CFV      Full           Yes      Yes                                      +---------+---------------+---------+-----------+----------+-------------------+ SFJ      Full                                                             +---------+---------------+---------+-----------+----------+-------------------+  FV Prox  Full                                                              +---------+---------------+---------+-----------+----------+-------------------+ FV Mid   Full                                                             +---------+---------------+---------+-----------+----------+-------------------+ FV DistalFull                                                             +---------+---------------+---------+-----------+----------+-------------------+ PFV      Full                                                             +---------+---------------+---------+-----------+----------+-------------------+ POP      Full           Yes      Yes                                      +---------+---------------+---------+-----------+----------+-------------------+ PTV      Full                                                             +---------+---------------+---------+-----------+----------+-------------------+ PERO                                                  Not well visualized +---------+---------------+---------+-----------+----------+-------------------+   +---------+---------------+---------+-----------+----------+-------------------+ LEFT     CompressibilityPhasicitySpontaneityPropertiesThrombus Aging      +---------+---------------+---------+-----------+----------+-------------------+ CFV      Full           Yes      Yes                                      +---------+---------------+---------+-----------+----------+-------------------+ SFJ      Full                                                             +---------+---------------+---------+-----------+----------+-------------------+ FV Prox  None  No       No                   Acute               +---------+---------------+---------+-----------+----------+-------------------+ FV Mid   None           No       No                   Acute               +---------+---------------+---------+-----------+----------+-------------------+ FV  DistalNone           No       No                   Acute               +---------+---------------+---------+-----------+----------+-------------------+ PFV      Full                                                             +---------+---------------+---------+-----------+----------+-------------------+ POP      None           No       No                   Acute               +---------+---------------+---------+-----------+----------+-------------------+ PTV      Full                                                             +---------+---------------+---------+-----------+----------+-------------------+ PERO                                                  Not well visualized +---------+---------------+---------+-----------+----------+-------------------+    Summary: RIGHT: - There is no evidence of deep vein thrombosis in the lower extremity.  - No cystic structure found in the popliteal fossa.  LEFT: - Findings consistent with acute deep vein thrombosis involving the left femoral vein, left popliteal vein, and left gastrocnemius veins. - No cystic structure found in the popliteal fossa.  *See table(s) above for measurements and observations.    Preliminary     Procedures Procedures   Medications Ordered in ED Medications  pantoprazole (PROTONIX) injection 40 mg (40 mg Intravenous Given 11/19/20 1418)    ED Course  I have reviewed the triage vital signs and the nursing notes.  Pertinent labs & imaging results that were available during my care of the patient were reviewed by me and considered in my medical decision making (see chart for details).  Clinical Course as of 11/19/20 1430  Fri Nov 19, 2020  1232 12.2 10.7 11.1 15.5   [CG]    Clinical Course User Index [CG] Arlean Hopping   MDM Rules/Calculators/A&P  80 yo F with history of myeloproliferative disorder, PE and DVT on Eliquis presents to the ER for  evaluation of left leg pain and swelling. In process of getting melena/abdominal pain that started 10/31/20, and positive hemoccult stool worked up by [facility]. Stopped her Eliquis 11/01/20.   EMR triage and nursing notes reviewed  DDx - high suspicion for DVT.  No warmth, fevers and cellulitis less likely.    Labs and imaging ordered by me - these were personally visualized and interpreted  Labs reveal - Hemoglobin 12.2, 15.5 3 months ago.    Imaging reveals - positive for DVT on LLE  Consults in the ED - [facility] PA Lemmon, hematology Dr Lorenso Courier, hospitalist to admit.    Per GI PA will likely be taken to endoscopy suite today.  Dr Lorenso Courier recommending IR consult for possible IVC consideration if patient is confirmed to have GIB.    Updated patient on POC. Discussed with EDP Ray.  1450: Spoke to [employer] PA who will discuss with his attending. Spoke to hospitalist who will admit patient.   Final Clinical Impression(s) / ED Diagnoses Final diagnoses:  Acute deep vein thrombosis (DVT) of femoral vein of left lower extremity [facility])    Rx / DC Orders ED Discharge Orders    None       Kinnie Feil, PA-C 11/19/20 1451    Pattricia Boss, MD 11/22/20 1345

## 2020-11-19 NOTE — ED Triage Notes (Signed)
Pt has increased swelling left leg with pain since stopping Eliqis April 4th due to internal bleeding.

## 2020-11-19 NOTE — Patient Instructions (Signed)
PATIENT INSTRUCTED TO GO DIRECTLY TO [facility] ED FOR FURTHER EVALUATION FOR LEFT LOWER LEG SWELLING CONCERNING FOR A RECURRENT DVT. ELIQUIS WAS PLACED ON HOLD SECONDARY TO MELENA/GI BLEED PER PCP.   TIMING OF UPPER ENDOSCOPY TO BE DETERMINED AFTER ED EVALUATION.

## 2020-11-19 NOTE — H&P (View-Only) (Signed)
[facility]  We are aware patient has arrived in the hospital and agree with admission for further evaluation of GI bleed and acute DVT and we are here to consult.  Patient was seen in our outpatient clinic today by Colleen Kennedy Smith our nurse practitioner.  She has history of melena for which her Eliquis has been on hold.  Acute finding of lower extremity DVT.  Plan: 1.  Patient is scheduled for an EGD tomorrow with Dr. Scottie Stanish.  Risks and benefits of this exam were discussed with her during her office visit today. 2.  Continue to monitor hemoglobin with transfusion as needed less than 7 3.  Patient can be on a clear liquid diet today and n.p.o. at midnight 4.  Recommend Pantoprazole 40 mg twice daily 5.  Further recommendations to follow EGD tomorrow.  Jennifer Lemmon, PA-C    Attending physician's note   I have taken an interval history, reviewed the chart and examined the patient. I agree with the Advanced Practitioner's note, impression and recommendations.   Seen briefly in ED  UGI bleed likely d/t PUD. H/O Aleve. HD stable. Hb 12 (was 10.7 2 weeks ago). No active bleeding. DVT on Eliquis (on hold since 4/5).  Currently with acute DVT. GERD with HH. No dysphagia Myeloproliferative disorder on hydroxyurea  Plan: -Proceed with IVC filter placement -EGD in a.m. Discussed risks and benefits -IV Protonix 40 twice daily. -Trend CBC -Discussed with hematology.   Raj Jettie Mannor, MD [facility] [phone]  

## 2020-11-19 NOTE — ED Notes (Signed)
Pt transported to IR 

## 2020-11-19 NOTE — Procedures (Signed)
Interventional Radiology Procedure Note  Procedure: Retrievable IVC filter   Indication: DVT and GI bleed  Findings: Please refer to procedural dictation for full description.  Complications: None  EBL: < 10 mL  Miachel Roux, MD [phone]

## 2020-11-19 NOTE — Progress Notes (Signed)
11/19/2020 Kimberly Watson [id number] May 17, 1941   Chief Complaint: Melena   History of Present Illness: Kimberly Watson is an 80 year old female with a past medical history of hypertension, myeloproliferative disorder with JAK2 gene mutation, LLE DVT and PE  In 2017 on Eliquis, osteoporosis, GERD and esophageal stricture. She took Aleve 200 mg 2 tabs once daily without food for 4 consecutive days due to having right knee pain early April 2022.  On Sunday 10/31/2020 she developed central upper abdominal pain and passed 5-6 mushy black melenic stools.  On Monday 4/4 she passed 4 melenic type stools.  She called her PCP and she was evaluated in office on Tuesday 11/02/2020 by Liane Comber NP.  On exam, stool was guaiac positive.  Eliquis was held.  Labs were done which showed a hemoglobin level of 11.1 down from a baseline hemoglobin 15.5.  She was started on Ferrous Sulfate 325 mg once daily and Omeprazole   She presents to our office today for further GI evaluation. She denies having any dysphagia or heartburn. She has increased burping. No further melenic stools, however, her stools are now darker green since starting Ferrous Sulfate. She underwent an EGD 11/01/2017 which showed gastric polyps otherwise was normal. A colonoscopy was done on the same date which showed diverticulosis, no polyps.   On exam, her LLE/calf is significantly swollen and mildly edematous. She noticed more swelling to her left leg over the past few days without any noticeable pain when walking. She stated having chronic LE edema. As noted above, history of a LLE DVT and PE in 2017. No CP or SOB.    CBC Latest Ref Rng & Units 11/04/2020 11/02/2020 08/04/2020  WBC 3.8 - 10.8 Thousand/uL 8.2 8.4 7.3  Hemoglobin 11.7 - 15.5 g/dL 10.7(L) 11.1(L) 15.5  Hematocrit 35.0 - 45.0 % 32.2(L) 33.9(L) 46.0(H)  Platelets 140 - 400 Thousand/uL 413(H) 391 359   Iron 88. Iron saturation 25. Ferritin 59.   EGD 11/01/2017: - No endoscopic  esophageal abnormality to explain patient's dysphagia.    Esophagus was dilated. - A single gastric polyp. Biopsied. - The examination was otherwise normal.  Colonoscopy 11/01/2017: - Diverticulosis in the sigmoid colon. - The examination was otherwise normal on direct and retroflexion views. - No specimens collected.  Current Outpatient Medications on File Prior to Visit  Medication Sig Dispense Refill  . amoxicillin (AMOXIL) 500 MG capsule Take 4 capsules 2 hours before Dental Procedure 12 capsule 1  . Ascorbic Acid (VITAMIN C PO) Take 1 tablet by mouth daily.    . calcium carbonate (OS-CAL) 600 MG TABS tablet Take 600 mg by mouth daily with breakfast.     . Cholecalciferol (VITAMIN D PO) Take 5,000 Units by mouth in the morning and at bedtime.     . ferrous sulfate 325 (65 FE) MG tablet Take 325 mg by mouth daily with breakfast.    . furosemide (LASIX) 40 MG tablet TAKE 1 TABLET DAILY FOR BLOOD PRESSURE AND FLUID. 90 tablet 1  . hydroxyurea (HYDREA) 500 MG capsule TAKE 1 CAPSULE (500 MG TOTAL) BY MOUTH DAILY. MAY TAKE WITH FOOD TO MINIMIZE GI SIDE EFFECTS. 90 capsule 3  . MAGNESIUM PO Take 500 mg by mouth 2 (two) times daily.    . metoprolol succinate (TOPROL-XL) 100 MG 24 hr tablet TAKE 1 TABLET DAILY FOR BLOOD PRESSURE (Patient taking differently: TAKE 1/2 TABLET DAILY FOR BLOOD PRESSURE) 90 tablet 3  . omeprazole (PRILOSEC) 20 MG capsule  Take 1 cap 30 min prior to breakfast and dinner. 60 capsule 2  . OVER THE COUNTER MEDICATION Take 1 tablet by mouth daily as needed (allergies). OTC allergy pill    . polyvinyl alcohol (LIQUIFILM TEARS) 1.4 % ophthalmic solution Place 1 drop into both eyes as needed for dry eyes.    . sodium chloride (OCEAN) 0.65 % SOLN nasal spray Place 1 spray into both nostrils as needed for congestion.    Marland Kitchen ELIQUIS 2.5 MG TABS tablet TAKE 1 TABLET BY MOUTH TWICE A DAY TO PREVENT BLOOD CLOTS (Patient not taking: Reported on 11/19/2020) 180 tablet 3   No current  facility-administered medications on file prior to visit.   Allergies  Allergen Reactions  . Evista [Raloxifene] Other (See Comments)    Blood clots - DVT and Pulmonary Embolism   . Fosamax [Alendronate Sodium]     esophagitis    Current Medications, Allergies, Past Medical History, Past Surgical History, Family History and Social History were reviewed in [employer] record.   Review of Systems:   Constitutional: Negative for fever, sweats, chills or weight loss.  Respiratory: Negative for shortness of breath.   Cardiovascular: + leg swelling.  Gastrointestinal: See HPI.  Musculoskeletal: Negative for back pain or muscle aches.  Neurological: Negative for dizziness, headaches or paresthesias.    Physical Exam: BP (!) 142/80 (BP Location: Left Arm, Patient Position: Sitting, Cuff Size: Normal)   Pulse 80   Ht 5' 2.25" (1.581 m)   Wt 176 lb 8 oz (80.1 kg)   BMI 32.02 kg/m  General: 80 year old female in NAD. Head: Normocephalic and atraumatic. Eyes: No scleral icterus. Conjunctiva pink . Ears: Normal auditory acuity. Mouth: Dentition intact. No ulcers or lesions.  Lungs: Clear throughout to auscultation. Heart: Regular rate, rhythm slightly irregular during inspiration only.  Abdomen: Soft, nontender and nondistended. No masses or hepatomegaly. Normal bowel sounds x 4 quadrants. RUQ scar intact.  Rectal: Deferred. Heme + stool per recent exam by PCP.  Musculoskeletal: Symmetrical with no gross deformities. Extremities: RLE with mild pretibial erythema and mild edema. LLE significantly edematous nearly twice the width of her RLE with surrounding erythema and tenderness concerning for DVT.  Neurological: Alert oriented x 4. No focal deficits.  Psychological: Alert and cooperative. Normal mood and affect  Assessment and Recommendations:  15. 80 year old female with a reported history of a hiatal hernia, esophageal stricture presents for further evaluation  for UGI bleed/melena and anemia in setting or recent NSAID use and on chronic Eliquis. Hg 11.1 on 11/02/2020 down from baseline Hg 15.5. Repeat Hg 10.7 on 4/7. Normal Iron levels. + FOBT.  -Continue Omeprazole 20mg  po bid -EGD benefits and risks discussed including risk with sedation, risk of bleeding, perforation and infection -EGD timing to be verified after ED evaluation for possible recurrent LLE DVT completed -CBC, BMP to be done in the ED  2. LLE swelling/erythema concerning for recurrent DVT off Eliquis since 11/02/2020 secondary to UGI bleed/melena. No CP or SOB.  -See photo of bilateral LEs in [place] -Patient sent to [facility] for stat LE doppler study, rule out DVT. PCP contacted, agreed with sending patient to [facility]  3. History of PE/DVT, Eliquis on hold as noted above   4. Myeloproliferative disorder  On Hydroxyurea followed by hematologist Dr. Alvy Bimler

## 2020-11-19 NOTE — H&P (Signed)
History and Physical    [name] [id number] DOB: 1941-01-21 DOA: 11/19/2020  PCP: Unk Pinto, MD  Patient coming from: Home  Chief Complaint: Leg swelling  HPI: Kimberly Watson is a 80 y.o. 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  Patient is post to be undergoing work-up for possible upper GI source of bleeding.  She frequently uses NSAIDs.  She denies any chronic abdominal pain.  She denies any nausea or vomiting or bright red blood per rectum.  She denies any generalized weakness.  Patient found to have a new DVT in her leg today.  She denies any shortness of breath or chest pain.  Patient be referred for admission for new DVT in the setting of possible GI bleed.  GI services along with hematology and interventional radiology have all been called to gather a plan for the next movement.  Review of Systems: As per HPI otherwise 10 point review of systems negative.   Past Medical History:  Diagnosis Date  . AKI (acute kidney injury) ([place]) 08/19/2015  . Cataract   . Clotting disorder ([place])   . DVT of lower extremity (deep venous thrombosis) ([place])    left, resolved  . Elevated hemoglobin A1c   . GERD (gastroesophageal reflux disease)   . H/O left knee surgery   . Hypertension   . JAK2 gene mutation   . Leiomyoma    adnexal Leiomyoma removal in 1/11  . 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 Nephrolithiasis   . Osteoporosis   . PE (pulmonary embolism) 08/2015    Past Surgical History:  Procedure Laterality Date  . CHOLECYSTECTOMY    . cyst removed off spine    . IVC FILTER PLACEMENT ([facility] HX)  08/2015  . JOINT REPLACEMENT Left total knee  . TONSILLECTOMY    . UTERINE FIBROID SURGERY       reports that she has never smoked.  She has never used smokeless tobacco. She reports that she does not drink alcohol and does not use drugs.  Allergies  Allergen Reactions  . Evista [Raloxifene] Other (See Comments)    Blood clots - DVT and Pulmonary Embolism   . Fosamax [Alendronate Sodium]     esophagitis    Family History  Problem Relation Age of Onset  . Hypertension Mother   . Diabetes Mother   . Breast cancer Mother   . Congestive Heart Failure Mother        Died age 90  . Osteoporosis Father   . Colon cancer Neg Hx   . Colon polyps Neg Hx   . Esophageal cancer Neg Hx   . Rectal cancer Neg Hx   . Stomach cancer Neg Hx     Prior to Admission medications   Medication Sig Start Date End Date Taking? Authorizing Provider  amoxicillin (AMOXIL) 500 MG capsule Take 4 capsules 2 hours before Dental Procedure 12/31/17  Yes Unk Pinto, MD  Ascorbic Acid (VITAMIN C PO) Take 1 tablet by mouth daily.   Yes [provider]  calcium carbonate (OS-CAL) 600 MG TABS tablet Take 600 mg by mouth daily with breakfast.    Yes [provider]  Cholecalciferol (DIALYVITE VITAMIN D 5000) 125 MCG (5000 UT) capsule Take 5,000 Units  by mouth daily.   Yes [provider]  ferrous sulfate 325 (65 FE) MG tablet Take 325 mg by mouth daily with breakfast.   Yes [provider]  furosemide (LASIX) 40 MG tablet TAKE 1 TABLET DAILY FOR BLOOD PRESSURE AND FLUID. Patient taking differently: Take 40 mg by mouth daily. 03/24/20  Yes Liane Comber, NP  hydroxyurea (HYDREA) 500 MG capsule TAKE 1 CAPSULE (500 MG TOTAL) BY MOUTH DAILY. MAY TAKE WITH FOOD TO MINIMIZE GI SIDE EFFECTS. 12/22/19  Yes Gorsuch, Ni, MD  MAGNESIUM PO Take 500 mg by mouth daily.   Yes [provider]  metoprolol succinate (TOPROL-XL) 100 MG 24 hr tablet TAKE 1 TABLET DAILY FOR BLOOD PRESSURE Patient taking differently: Take 50 mg by mouth daily. 06/23/20  Yes Unk Pinto, MD  omeprazole (PRILOSEC) 20 MG capsule Take 1 cap  30 min prior to breakfast and dinner. Patient taking differently: Take 20 mg by mouth 2 (two) times daily before a meal. 11/02/20  Yes Liane Comber, NP  polyvinyl alcohol (LIQUIFILM TEARS) 1.4 % ophthalmic solution Place 1 drop into both eyes as needed for dry eyes.   Yes [provider]  sodium chloride (OCEAN) 0.65 % SOLN nasal spray Place 1 spray into both nostrils as needed for congestion.   Yes [provider]  ELIQUIS 2.5 MG TABS tablet TAKE 1 TABLET BY MOUTH TWICE A DAY TO PREVENT BLOOD CLOTS Patient not taking: No sig reported 09/22/20   [name], MD    Physical Exam: Vitals:   11/19/20 1156 11/19/20 1300 11/19/20 1400 11/19/20 1500  BP: (!) 163/86 129/90 (!) 158/99 132/90  Pulse: 69 66 66 69  Resp: 17 15 16 16   Temp:      TempSrc:      SpO2: 96% 97% 98% 96%  Weight:      Height:          Constitutional: NAD, calm, comfortable Vitals:   11/19/20 1156 11/19/20 1300 11/19/20 1400 11/19/20 1500  BP: (!) 163/86 129/90 (!) 158/99 132/90  Pulse: 69 66 66 69  Resp: 17 15 16 16   Temp:      TempSrc:      SpO2: 96% 97% 98% 96%  Weight:      Height:       Eyes: PERRL, lids and conjunctivae normal ENMT: Mucous membranes are moist. Posterior pharynx clear of any exudate or lesions.Normal dentition.  Neck: normal, supple, no masses, no thyromegaly Respiratory: clear to auscultation bilaterally, no wheezing, no crackles. Normal respiratory effort. No accessory muscle use.  Cardiovascular: Regular rate and rhythm, no murmurs / rubs / gallops. No extremity edema. 2+ pedal pulses. No carotid bruits.  Abdomen: no tenderness, no masses palpated. No hepatosplenomegaly. Bowel sounds positive.  Musculoskeletal: no clubbing / cyanosis. No joint deformity upper and lower extremities. Good ROM, no contractures. Normal muscle tone.  Mild swelling to left lower extremity Skin: no rashes, lesions, ulcers. No induration Neurologic: CN 2-12 grossly intact. Sensation intact,  DTR normal. Strength 5/5 in all 4.  Psychiatric: Normal judgment and insight. Alert and oriented x 3. Normal mood.    Labs on Admission: I have personally reviewed following labs and imaging studies  CBC: Recent Labs  Lab 11/19/20 1132  WBC 8.2  NEUTROABS 5.3  HGB 12.2  HCT 39.8  MCV 102.8*  PLT [id number]   Basic Metabolic Panel: Recent Labs  Lab 11/19/20 1236  NA 144  K 4.1  CL 109  CO2 25  GLUCOSE 97  BUN 26*  CREATININE 0.97  CALCIUM 9.9   GFR: Estimated Creatinine Clearance: 46.3 mL/min (by C-G formula based on SCr of 0.97 mg/dL). Liver Function Tests: Recent Labs  Lab 11/19/20 1236  AST 17  ALT 12  ALKPHOS 62  BILITOT 1.1  PROT 7.7  ALBUMIN 4.0   No results for input(s): LIPASE, AMYLASE in the last 168 hours. No results for input(s): AMMONIA in the last 168 hours. Coagulation Profile: No results for input(s): INR, PROTIME in the last 168 hours. Cardiac Enzymes: No results for input(s): CKTOTAL, CKMB, CKMBINDEX, TROPONINI in the last 168 hours. BNP (last 3 results) No results for input(s): PROBNP in the last 8760 hours. HbA1C: No results for input(s): HGBA1C in the last 72 hours. CBG: No results for input(s): GLUCAP in the last 168 hours. Lipid Profile: No results for input(s): CHOL, HDL, LDLCALC, TRIG, CHOLHDL, LDLDIRECT in the last 72 hours. Thyroid Function Tests: No results for input(s): TSH, T4TOTAL, FREET4, T3FREE, THYROIDAB in the last 72 hours. Anemia Panel: No results for input(s): VITAMINB12, FOLATE, FERRITIN, TIBC, IRON, RETICCTPCT in the last 72 hours. Urine analysis:    Component Value Date/Time   COLORURINE YELLOW 03/03/2020 1003   APPEARANCEUR CLEAR 03/03/2020 1003   LABSPEC 1.010 03/03/2020 1003   PHURINE < OR = 5.0 03/03/2020 1003   GLUCOSEU NEGATIVE 03/03/2020 1003   HGBUR NEGATIVE 03/03/2020 1003   BILIRUBINUR NEGATIVE 08/24/2016 1205   May 03/03/2020 1003   PROTEINUR NEGATIVE 03/03/2020 1003   UROBILINOGEN 0.2  03/25/2014 1052   NITRITE NEGATIVE 03/03/2020 1003   LEUKOCYTESUR NEGATIVE 03/03/2020 1003   Sepsis Labs: !!!!!!!!!!!!!!!!!!!!!!!!!!!!!!!!!!!!!!!!!!!! @LABRCNTIP (procalcitonin:4,lacticidven:4) )No results found for this or any previous visit (from the past 240 hour(s)).   Radiological Exams on Admission: VAS [place] LOWER EXTREMITY VENOUS (DVT) (ONLY [facility] & [facility] 7a-7p)  Result Date: 11/19/2020  Lower Venous DVT Study Patient Name:  CATRICIA WILKINSON  Date of Exam:   11/19/2020 Medical Rec #: [id number]         Accession #:    [id number] Date of Birth: [birth year]         Patient Gender: F Patient Age:   79Y Exam Location:  [facility] Procedure:      VAS [place] LOWER EXTREMITY VENOUS (DVT) Referring Phys: [id number] CLAUDIA J GIBBONS --------------------------------------------------------------------------------  Indications: Swelling.  Risk Factors: None identified. Limitations: Poor ultrasound/tissue interface. Comparison Study: No prior studies. Performing Technologist: Oliver Hum RVT  Examination Guidelines: A complete evaluation includes B-mode imaging, spectral Doppler, color Doppler, and power Doppler as needed of all accessible portions of each vessel. Bilateral testing is considered an integral part of a complete examination. Limited examinations for reoccurring indications may be performed as noted. The reflux portion of the exam is performed with the patient in reverse Trendelenburg.  +---------+---------------+---------+-----------+----------+-------------------+ RIGHT    CompressibilityPhasicitySpontaneityPropertiesThrombus Aging      +---------+---------------+---------+-----------+----------+-------------------+ CFV      Full           Yes      Yes                                      +---------+---------------+---------+-----------+----------+-------------------+ SFJ      Full                                                              +---------+---------------+---------+-----------+----------+-------------------+  FV Prox  Full                                                             +---------+---------------+---------+-----------+----------+-------------------+ FV Mid   Full                                                             +---------+---------------+---------+-----------+----------+-------------------+ FV DistalFull                                                             +---------+---------------+---------+-----------+----------+-------------------+ PFV      Full                                                             +---------+---------------+---------+-----------+----------+-------------------+ POP      Full           Yes      Yes                                      +---------+---------------+---------+-----------+----------+-------------------+ PTV      Full                                                             +---------+---------------+---------+-----------+----------+-------------------+ PERO                                                  Not well visualized +---------+---------------+---------+-----------+----------+-------------------+   +---------+---------------+---------+-----------+----------+-------------------+ LEFT     CompressibilityPhasicitySpontaneityPropertiesThrombus Aging      +---------+---------------+---------+-----------+----------+-------------------+ CFV      Full           Yes      Yes                                      +---------+---------------+---------+-----------+----------+-------------------+ SFJ      Full                                                             +---------+---------------+---------+-----------+----------+-------------------+ FV Prox  None  No       No                   Acute               +---------+---------------+---------+-----------+----------+-------------------+ FV  Mid   None           No       No                   Acute               +---------+---------------+---------+-----------+----------+-------------------+ FV DistalNone           No       No                   Acute               +---------+---------------+---------+-----------+----------+-------------------+ PFV      Full                                                             +---------+---------------+---------+-----------+----------+-------------------+ POP      None           No       No                   Acute               +---------+---------------+---------+-----------+----------+-------------------+ PTV      Full                                                             +---------+---------------+---------+-----------+----------+-------------------+ PERO                                                  Not well visualized +---------+---------------+---------+-----------+----------+-------------------+    Summary: RIGHT: - There is no evidence of deep vein thrombosis in the lower extremity.  - No cystic structure found in the popliteal fossa.  LEFT: - Findings consistent with acute deep vein thrombosis involving the left femoral vein, left popliteal vein, and left gastrocnemius veins. - No cystic structure found in the popliteal fossa.  *See table(s) above for measurements and observations.    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  Patient being evaluated on whether to put IVC filter in prior to EGD.  Holding chronic anticoagulation in the setting of possible GI bleed as stated.  Active Problems:    GI bleed due to NSAIDs-rule  out peptic ulcer disease.  Patient hemoglobin is over 10.  Vitals are stable.  No overt bleeding at this time.  Chronic anticoagulation (apixaban) due to DVT/PE-again holding Eliquis.  Will need GI work-up with EGD either today or tomorrow.    Essential hypertension-holding medication at this time    Myeloproliferative disorder (HCC)-noted    Further recommendations pending on over all hospital course   DVT prophylaxis: Nothing Code Status: Full Family Communication: None Disposition Plan: 1 to 2 days Consults called: GI, hematology and interventional radiology Admission status: Admission   Anuar Walgren A MD [facility]  If 7PM-7AM, please contact night-coverage [email or web address] Password [facility]  11/19/2020, 3:34 PM

## 2020-11-19 NOTE — Progress Notes (Addendum)
[facility]  We are aware patient has arrived in the hospital and agree with admission for further evaluation of GI bleed and acute DVT and we are here to consult.  Patient was seen in our outpatient clinic today by Carl Best our nurse practitioner.  She has history of melena for which her Eliquis has been on hold.  Acute finding of lower extremity DVT.  Plan: 1.  Patient is scheduled for an EGD tomorrow with Dr. Lyndel Safe.  Risks and benefits of this exam were discussed with her during her office visit today. 2.  Continue to monitor hemoglobin with transfusion as needed less than 7 3.  Patient can be on a clear liquid diet today and n.p.o. at midnight 4.  Recommend Pantoprazole 40 mg twice daily 5.  Further recommendations to follow EGD tomorrow.  Ellouise Newer, PA-C    Attending physician's note   I have taken an interval history, reviewed the chart and examined the patient. I agree with the Advanced Practitioner's note, impression and recommendations.   Seen briefly in ED  UGI bleed likely d/t PUD. H/O Aleve. HD stable. Hb 12 (was 10.7 2 weeks ago). No active bleeding. DVT on Eliquis (on hold since 4/5).  Currently with acute DVT. GERD with HH. No dysphagia Myeloproliferative disorder on hydroxyurea  Plan: -Proceed with IVC filter placement -EGD in a.m. Discussed risks and benefits -IV Protonix 40 twice daily. -Trend CBC -Discussed with hematology.   Carmell Austria, MD [facility] [phone]

## 2020-11-19 NOTE — Progress Notes (Signed)
Referring Physician(s): Dorsey,J  Supervising Physician: Mir, Mauri Reading  Patient Status:  [facility] - In-pt  Chief Complaint:  Leg swelling  Subjective: Patient familiar to IR service from IVC filter placement in January of 2017 for acute saddle pulmonary embolism and left lower extremity DVT, followed by removal in May 2017.  She has a past medical history significant for myelodysplastic syndrome and above-mentioned DVT/PE, on chronic Eliquis which was held over 2 weeks ago for concerns of GI bleed, melanotic stools and was referred from the GI office to [place] [place] ED today secondary to swelling of her left lower extremity and concerns of new DVT. She is scheduled to undergo EGD tomorrow.  Lower extremity venous Doppler study today revealed acute DVT involving the left femoral vein, left popliteal vein, and left gastrocnemius veins.  No right lower extremity DVT.  Current labs include WBC 8.2, hemoglobin 12.2, platelets 252k, creatinine 0.97.  Request now received from oncology for IVC filter placement.  She currently denies fever, headache, chest pain, dyspnea, cough, abdominal/back pain, nausea, vomiting or visible bleeding.  She has had some recent NSAID use.  Additional medical history as below.  Past Medical History:  Diagnosis Date  . AKI (acute kidney injury) (HCC) 08/19/2015  . Cataract   . Clotting disorder (HCC)   . DVT of lower extremity (deep venous thrombosis) (HCC)    left, resolved  . Elevated hemoglobin A1c   . GERD (gastroesophageal reflux disease)   . H/O left knee surgery   . Hypertension   . JAK2 gene mutation   . Leiomyoma    adnexal Leiomyoma removal in 1/11  . 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 Nephrolithiasis   . Osteoporosis   . PE (pulmonary embolism) 08/2015   Past Surgical History:  Procedure Laterality Date  . CHOLECYSTECTOMY    . cyst removed off spine    . IVC FILTER PLACEMENT ([facility] HX)  08/2015  . JOINT REPLACEMENT Left total knee  .  TONSILLECTOMY    . UTERINE FIBROID SURGERY       Allergies: Evista [raloxifene] and Fosamax [alendronate sodium]  Medications: Prior to Admission medications   Medication Sig Start Date End Date Taking? Authorizing Provider  amoxicillin (AMOXIL) 500 MG capsule Take 4 capsules 2 hours before Dental Procedure 12/31/17  Yes [name], MD  Ascorbic Acid (VITAMIN C PO) Take 1 tablet by mouth daily.   Yes [provider]  calcium carbonate (OS-CAL) 600 MG TABS tablet Take 600 mg by mouth daily with breakfast.    Yes [provider]  Cholecalciferol (DIALYVITE VITAMIN D 5000) 125 MCG (5000 UT) capsule Take 5,000 Units by mouth daily.   Yes [provider]  ferrous sulfate 325 (65 FE) MG tablet Take 325 mg by mouth daily with breakfast.   Yes [provider]  furosemide (LASIX) 40 MG tablet TAKE 1 TABLET DAILY FOR BLOOD PRESSURE AND FLUID. Patient taking differently: Take 40 mg by mouth daily. 03/24/20  Yes Judd Gaudier, NP  hydroxyurea (HYDREA) 500 MG capsule TAKE 1 CAPSULE (500 MG TOTAL) BY MOUTH DAILY. MAY TAKE WITH FOOD TO MINIMIZE GI SIDE EFFECTS. 12/22/19  Yes Gorsuch, Ni, MD  MAGNESIUM PO Take 500 mg by mouth daily.   Yes [provider]  metoprolol succinate (TOPROL-XL) 100 MG 24 hr tablet TAKE 1 TABLET DAILY FOR BLOOD PRESSURE Patient taking differently: Take 50 mg by mouth daily. 06/23/20  Yes [name], MD  omeprazole (PRILOSEC) 20 MG  capsule Take 1 cap 30 min prior to breakfast and dinner. Patient taking differently: Take 20 mg by mouth 2 (two) times daily before a meal. 11/02/20  Yes Liane Comber, NP  polyvinyl alcohol (LIQUIFILM TEARS) 1.4 % ophthalmic solution Place 1 drop into both eyes as needed for dry eyes.   Yes [provider]  sodium chloride (OCEAN) 0.65 % SOLN nasal spray Place 1 spray into both nostrils as needed for congestion.   Yes [provider]  ELIQUIS 2.5 MG TABS tablet TAKE 1 TABLET BY  MOUTH TWICE A DAY TO PREVENT BLOOD CLOTS Patient not taking: No sig reported 09/22/20   [name], MD     Vital Signs: BP 132/90 (BP Location: Left Arm)   Pulse 69   Temp 97.8 F (36.6 C) (Oral)   Resp 16   Ht 5\' 3"  (1.6 m)   Wt 176 lb (79.8 kg)   SpO2 96%   BMI 31.18 kg/m   Physical Exam patient awake, alert.  Chest with slightly diminished breath sounds bases.  Heart with regular rate and rhythm.  Abdomen soft, positive bowel sounds, nontender; 1-2+ pitting pretibial edema bilaterally, slightly more on the left.  Both calves tender to palpation.  Slightly more erythema noted pretibial region left leg.  Imaging: VAS [place] LOWER EXTREMITY VENOUS (DVT) (ONLY [facility] & [facility] 7a-7p)  Result Date: 11/19/2020  Lower Venous DVT Study Patient Name:  Kimberly Watson  Date of Exam:   11/19/2020 Medical Rec #: [id number]         Accession #:    [id number] Date of Birth: 1941-04-07         Patient Gender: F Patient Age:   79Y Exam Location:  [facility] Procedure:      VAS [place] LOWER EXTREMITY VENOUS (DVT) Referring Phys: [id number] CLAUDIA J GIBBONS --------------------------------------------------------------------------------  Indications: Swelling.  Risk Factors: None identified. Limitations: Poor ultrasound/tissue interface. Comparison Study: No prior studies. Performing Technologist: Oliver Hum RVT  Examination Guidelines: A complete evaluation includes B-mode imaging, spectral Doppler, color Doppler, and power Doppler as needed of all accessible portions of each vessel. Bilateral testing is considered an integral part of a complete examination. Limited examinations for reoccurring indications may be performed as noted. The reflux portion of the exam is performed with the patient in reverse Trendelenburg.  +---------+---------------+---------+-----------+----------+-------------------+ RIGHT    CompressibilityPhasicitySpontaneityPropertiesThrombus Aging       +---------+---------------+---------+-----------+----------+-------------------+ CFV      Full           Yes      Yes                                      +---------+---------------+---------+-----------+----------+-------------------+ SFJ      Full                                                             +---------+---------------+---------+-----------+----------+-------------------+ FV Prox  Full                                                             +---------+---------------+---------+-----------+----------+-------------------+  FV Mid   Full                                                             +---------+---------------+---------+-----------+----------+-------------------+ FV DistalFull                                                             +---------+---------------+---------+-----------+----------+-------------------+ PFV      Full                                                             +---------+---------------+---------+-----------+----------+-------------------+ POP      Full           Yes      Yes                                      +---------+---------------+---------+-----------+----------+-------------------+ PTV      Full                                                             +---------+---------------+---------+-----------+----------+-------------------+ PERO                                                  Not well visualized +---------+---------------+---------+-----------+----------+-------------------+   +---------+---------------+---------+-----------+----------+-------------------+ LEFT     CompressibilityPhasicitySpontaneityPropertiesThrombus Aging      +---------+---------------+---------+-----------+----------+-------------------+ CFV      Full           Yes      Yes                                      +---------+---------------+---------+-----------+----------+-------------------+  SFJ      Full                                                             +---------+---------------+---------+-----------+----------+-------------------+ FV Prox  None           No       No                   Acute               +---------+---------------+---------+-----------+----------+-------------------+ FV Mid   None           No  No                   Acute               +---------+---------------+---------+-----------+----------+-------------------+ FV DistalNone           No       No                   Acute               +---------+---------------+---------+-----------+----------+-------------------+ PFV      Full                                                             +---------+---------------+---------+-----------+----------+-------------------+ POP      None           No       No                   Acute               +---------+---------------+---------+-----------+----------+-------------------+ PTV      Full                                                             +---------+---------------+---------+-----------+----------+-------------------+ PERO                                                  Not well visualized +---------+---------------+---------+-----------+----------+-------------------+    Summary: RIGHT: - There is no evidence of deep vein thrombosis in the lower extremity.  - No cystic structure found in the popliteal fossa.  LEFT: - Findings consistent with acute deep vein thrombosis involving the left femoral vein, left popliteal vein, and left gastrocnemius veins. - No cystic structure found in the popliteal fossa.  *See table(s) above for measurements and observations.    Preliminary     Labs:  CBC: Recent Labs    08/04/20 1549 11/02/20 1015 11/04/20 1633 11/19/20 1132  WBC 7.3 8.4 8.2 8.2  HGB 15.5 11.1* 10.7* 12.2  HCT 46.0* 33.9* 32.2* 39.8  PLT 359 391 413* 252    COAGS: No results for input(s):  INR, APTT in the last 8760 hours.  BMP: Recent Labs    03/03/20 1003 07/01/20 1040 08/04/20 1549 11/19/20 1236  NA 142 141 141 144  K 4.2 4.0 4.3 4.1  CL 105 108 101 109  CO2 31 24 26 25   GLUCOSE 98 93 78 97  BUN 24 12 19  26*  CALCIUM 9.9 9.6 9.9 9.9  CREATININE 0.91 0.82 0.89* 0.97  GFRNONAA 60 >60 61 59*  GFRAA 70  --  71  --     LIVER FUNCTION TESTS: Recent Labs    03/03/20 1003 07/01/20 1040 08/04/20 1549 11/19/20 1236  BILITOT 0.9 1.4* 1.3* 1.1  AST 22 20 21 17   ALT 19 15 18 12   ALKPHOS  --  62  --  62  PROT 7.0 7.2 7.4  7.7  ALBUMIN  --  3.8  --  4.0    Assessment and Plan: Patient familiar to IR service from IVC filter placement in January of 2017 for acute saddle pulmonary embolism and left lower extremity DVT, followed by removal in May 2017.  She has a past medical history significant for myelodysplastic syndrome and above-mentioned DVT/PE, on chronic Eliquis which was held over 2 weeks ago for concerns of GI bleed, melanotic stools and was referred from the GI office to [facility] ED today secondary to swelling of her left lower extremity and concerns of new DVT. She is scheduled to undergo EGD tomorrow.  Lower extremity venous Doppler study today revealed acute DVT involving the left femoral vein, left popliteal vein, and left gastrocnemius veins.  No right lower extremity DVT.  Current labs include WBC 8.2, hemoglobin 12.2, platelets 252k, creatinine 0.97.  Request now received from oncology for IVC filter placement.  Case/imaging studies have been reviewed by Dr. Dwaine Gale.Risks and benefits discussed with the patient including, but not limited to bleeding, infection, contrast induced renal failure, filter fracture or migration which can lead to emergency surgery or even death, strut penetration with damage or irritation to adjacent structures and caval thrombosis.  All of the patient's questions were answered, patient is agreeable to proceed. Consent signed and in  chart.  Procedure scheduled for today.  Plans confirmed with Dr. Lorenso Courier.   Electronically Signed: D. Rowe Robert, PA-C 11/19/2020, 4:00 PM   I spent a total of 25 minutes at the the patient's bedside AND on the patient's hospital floor or unit, greater than 50% of which was counseling/coordinating care for IVC filter placement    Patient ID: Kimberly Watson, female   DOB: [birth year], 80 y.o.   MRN: [id number]

## 2020-11-20 ENCOUNTER — Inpatient Hospital Stay (HOSPITAL_COMMUNITY): Payer: Medicare PPO | Admitting: Certified Registered Nurse Anesthetist

## 2020-11-20 ENCOUNTER — Encounter (HOSPITAL_COMMUNITY): Payer: Self-pay | Admitting: Family Medicine

## 2020-11-20 ENCOUNTER — Encounter (HOSPITAL_COMMUNITY)
Admission: EM | Disposition: A | Discharge status: Home / Self Care | Payer: Self-pay | Source: Home / Self Care | Attending: Internal Medicine

## 2020-11-20 DIAGNOSIS — I82412 Acute embolism and thrombosis of left femoral vein: Secondary | ICD-10-CM | POA: Diagnosis not present

## 2020-11-20 DIAGNOSIS — I10 Essential (primary) hypertension: Secondary | ICD-10-CM | POA: Diagnosis not present

## 2020-11-20 DIAGNOSIS — K259 Gastric ulcer, unspecified as acute or chronic, without hemorrhage or perforation: Secondary | ICD-10-CM

## 2020-11-20 DIAGNOSIS — K250 Acute gastric ulcer with hemorrhage: Secondary | ICD-10-CM

## 2020-11-20 HISTORY — PX: ESOPHAGOGASTRODUODENOSCOPY (EGD) WITH PROPOFOL: SHX5813

## 2020-11-20 LAB — BASIC METABOLIC PANEL
Anion gap: 8 (ref 5–15)
BUN: 15 mg/dL (ref 8–23)
CO2: 24 mmol/L (ref 22–32)
Calcium: 9.1 mg/dL (ref 8.9–10.3)
Chloride: 105 mmol/L (ref 98–111)
Creatinine, Ser: 0.84 mg/dL (ref 0.44–1.00)
GFR, Estimated: >60 mL/min (ref >60)
Glucose, Bld: 87 mg/dL (ref 70–99)
Potassium: 4.3 mmol/L (ref 3.5–5.1)
Sodium: 137 mmol/L (ref 135–145)

## 2020-11-20 LAB — CBC
HCT: 36.4 % (ref 36.0–46.0)
Hemoglobin: 11.4 g/dL — ABNORMAL LOW (ref 12.0–15.0)
MCH: 31.8 pg (ref 26.0–34.0)
MCHC: 31.3 g/dL (ref 30.0–36.0)
MCV: 101.4 fL — ABNORMAL HIGH (ref 80.0–100.0)
Platelets: 231 10*3/uL (ref 150–400)
RBC: 3.59 MIL/uL — ABNORMAL LOW (ref 3.87–5.11)
RDW: 13.9 % (ref 11.5–15.5)
WBC: 6.3 10*3/uL (ref 4.0–10.5)
nRBC: 0 % (ref 0.0–0.2)

## 2020-11-20 LAB — PROTIME-INR
INR: 1.1 (ref 0.8–1.2)
Prothrombin Time: 14.4 seconds (ref 11.4–15.2)

## 2020-11-20 LAB — APTT: aPTT: 33 seconds (ref 24–36)

## 2020-11-20 SURGERY — ESOPHAGOGASTRODUODENOSCOPY (EGD) WITH PROPOFOL
Anesthesia: Monitor Anesthesia Care

## 2020-11-20 MED ORDER — SODIUM CHLORIDE 0.9% FLUSH: 3.0000 mL | INTRAVENOUS | Status: DC | PRN

## 2020-11-20 MED ORDER — PROPOFOL 1000 MG/100ML IV EMUL
INTRAVENOUS | Status: AC
  Filled 2020-11-20: qty 100

## 2020-11-20 MED ORDER — HEPARIN (PORCINE) 25000 UT/250ML-% IV SOLN
1000.0000 [IU]/h | INTRAVENOUS | Status: AC
  Administered 2020-11-20: 1100 [IU]/h via INTRAVENOUS
  Administered 2020-11-21: 1000 [IU]/h via INTRAVENOUS
  Filled 2020-11-20 (×2): qty 250

## 2020-11-20 MED ORDER — LACTATED RINGERS IV SOLN: INTRAVENOUS | Status: DC | PRN

## 2020-11-20 MED ORDER — LIDOCAINE 2% (20 MG/ML) 5 ML SYRINGE
INTRAMUSCULAR | Status: DC | PRN
  Administered 2020-11-20: 80 mg via INTRAVENOUS

## 2020-11-20 MED ORDER — PROPOFOL 500 MG/50ML IV EMUL
INTRAVENOUS | Status: DC | PRN
  Administered 2020-11-20: 100 ug/kg/min via INTRAVENOUS

## 2020-11-20 MED ORDER — PROPOFOL 10 MG/ML IV BOLUS
INTRAVENOUS | Status: DC | PRN
  Administered 2020-11-20: 30 mg via INTRAVENOUS
  Administered 2020-11-20: 10 mg via INTRAVENOUS

## 2020-11-20 MED ORDER — SUCRALFATE 1 G PO TABS
1.0000 g | ORAL_TABLET | Freq: Three times a day (TID) | ORAL | Status: DC
  Administered 2020-11-20 – 2020-11-22 (×9): 1 g via ORAL
  Filled 2020-11-20 (×8): qty 1

## 2020-11-20 MED ORDER — PANTOPRAZOLE SODIUM 40 MG PO TBEC
40.0000 mg | DELAYED_RELEASE_TABLET | Freq: Two times a day (BID) | ORAL | Status: DC
  Administered 2020-11-20 – 2020-11-22 (×5): 40 mg via ORAL
  Filled 2020-11-20 (×4): qty 1

## 2020-11-20 MED ORDER — SODIUM CHLORIDE 0.9 % IV SOLN: 250.0000 mL | INTRAVENOUS | Status: DC | PRN

## 2020-11-20 MED ORDER — SODIUM CHLORIDE 0.9% FLUSH
3.0000 mL | Freq: Two times a day (BID) | INTRAVENOUS | Status: DC
  Administered 2020-11-20 – 2020-11-22 (×3): 3 mL via INTRAVENOUS

## 2020-11-20 MED ORDER — ONDANSETRON HCL 4 MG/2ML IJ SOLN
INTRAMUSCULAR | Status: DC | PRN
  Administered 2020-11-20: 4 mg via INTRAVENOUS

## 2020-11-20 SURGICAL SUPPLY — 14 items

## 2020-11-20 NOTE — Interval H&P Note (Signed)
History and Physical Interval Note:  11/20/2020 8:16 AM  Kimberly Watson  has presented today for surgery, with the diagnosis of melena.  The various methods of treatment have been discussed with the patient and family. After consideration of risks, benefits and other options for treatment, the patient has consented to  Procedure(s): ESOPHAGOGASTRODUODENOSCOPY (EGD) WITH PROPOFOL (N/A) as a surgical intervention.  The patient's history has been reviewed, patient examined, no change in status, stable for surgery.  I have reviewed the patient's chart and labs.  Questions were answered to the patient's satisfaction.     Ambrielle Kington

## 2020-11-20 NOTE — Progress Notes (Signed)
[place] [phone]   [phone]  PROGRESS NOTE  Patient Care Team: Unk Pinto, MD as PCP - General (Internal Medicine) Monna Fam, MD as Consulting Physician (Ophthalmology) Gatha Mayer, MD as Consulting Physician (Gastroenterology) Kathie Rhodes, MD (Inactive) as Consulting Physician (Urology) Cheri Fowler, MD as Consulting Physician (Obstetrics and Gynecology)  Interval History:  --IVC filter placed last night. -- Endoscopy performed this morning, showed evidence of clean-based nonbleeding ulcer.  GI recommended restarting anticoagulation therapy. -- Patient had 2 stools this a.m. and noted they were "greenish in color". -- Hemoglobin dropped to 11.4 from 12.2 on admission. -- She otherwise vitally stable and in her normal state of health.  MEDICAL HISTORY:  Past Medical History:  Diagnosis Date  . AKI (acute kidney injury) ([place]) 08/19/2015  . Cataract   . Clotting disorder ([place])   . DVT of lower extremity (deep venous thrombosis) ([place])    left, resolved  . Elevated hemoglobin A1c   . GERD (gastroesophageal reflux disease)   . H/O left knee surgery   . Hypertension   . JAK2 gene mutation   . Leiomyoma    adnexal Leiomyoma removal in 1/11  . 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 Nephrolithiasis   . Osteoporosis   . PE (pulmonary embolism) 08/2015    SURGICAL HISTORY: Past Surgical History:  Procedure Laterality Date  . CHOLECYSTECTOMY    . cyst removed off spine    . IR IVC FILTER PLMT / S&I /IMG GUID/MOD SED  11/19/2020  . IVC FILTER PLACEMENT ([facility] HX)  08/2015  . JOINT REPLACEMENT Left total knee  . TONSILLECTOMY    . UTERINE FIBROID SURGERY      SOCIAL HISTORY: Social History   Socioeconomic History  . Marital status: Divorced    Spouse name: Not on file  . Number of children: 2  . Years of education: Not on file  . Highest education level: Not on file  Occupational History  .  Occupation: Pharmacist, hospital    Comment: Retired  Tobacco Use  . Smoking status: Never Smoker  . Smokeless tobacco: Never Used  Vaping Use  . Vaping Use: Never used  Substance and Sexual Activity  . Alcohol use: No  . Drug use: No  . Sexual activity: Not on file    Comment: retired, next of kin, 1 son and daughter.  Other Topics Concern  . Not on file  Social History Narrative   Divorced, 2 children lives alone.  Lived with a smoker for years.    Retired Radio producer   No alcohol tobacco or drug use   10/08/2017      Social Determinants of Health   Financial Resource Strain: Not on file  Food Insecurity: Not on file  Transportation Needs: Not on file  Physical Activity: Not on file  Stress: Not on file  Social Connections: Not on file  Intimate Partner Violence: Not on file    FAMILY HISTORY: Family History  Problem Relation Age of Onset  . Hypertension Mother   . Diabetes Mother   . Breast cancer Mother   . Congestive Heart Failure Mother        Died age 45  . Osteoporosis Father   . Colon cancer Neg Hx   . Colon polyps Neg Hx   . Esophageal cancer Neg Hx   . Rectal cancer Neg Hx   . Stomach cancer Neg Hx     ALLERGIES:  is allergic to  evista [raloxifene] and fosamax [alendronate sodium].  MEDICATIONS:  Current Facility-Administered Medications  Medication Dose Route Frequency Provider Last Rate Last Admin  . 0.9 %  sodium chloride infusion  250 mL Intravenous PRN Derrill Kay A, MD      . pantoprazole (PROTONIX) EC tablet 40 mg  40 mg Oral BID AC Nandigam, Kavitha V, MD   40 mg at 11/20/20 1035  . sodium chloride flush (NS) 0.9 % injection 3 mL  3 mL Intravenous Q12H David, Rachal A, MD      . sodium chloride flush (NS) 0.9 % injection 3 mL  3 mL Intravenous PRN Derrill Kay A, MD      . sucralfate (CARAFATE) tablet 1 g  1 g Oral TID WC & HS Nandigam, Kavitha V, MD        REVIEW OF SYSTEMS:   Constitutional: ( - ) fevers, ( - )  chills , ( - ) night  sweats Eyes: ( - ) blurriness of vision, ( - ) double vision, ( - ) watery eyes Ears, nose, mouth, throat, and face: ( - ) mucositis, ( - ) sore throat Respiratory: ( - ) cough, ( - ) dyspnea, ( - ) wheezes Cardiovascular: ( - ) palpitation, ( - ) chest discomfort, ( - ) lower extremity swelling Gastrointestinal:  ( - ) nausea, ( - ) heartburn, ( - ) change in bowel habits Skin: ( - ) abnormal skin rashes Lymphatics: ( - ) new lymphadenopathy, ( - ) easy bruising Neurological: ( - ) numbness, ( - ) tingling, ( - ) new weaknesses Behavioral/Psych: ( - ) mood change, ( - ) new changes  All other systems were reviewed with the patient and are negative.  PHYSICAL EXAMINATION: Vitals:   11/20/20 0856 11/20/20 1403  BP: 135/76 115/74  Pulse: 68 88  Resp: 14 17  Temp:  98.3 F (36.8 C)  SpO2: 95% 94%   Filed Weights   11/19/20 1044 11/19/20 1910 11/20/20 0721  Weight: 176 lb (79.8 kg) 181 lb 14.1 oz (82.5 kg) 176 lb (79.8 kg)    GENERAL: well appearing elderly Caucasian female in NAD  SKIN: skin color, texture, turgor are normal, no rashes or significant lesions EYES: conjunctiva are pink and non-injected, sclera clear LUNGS: clear to auscultation and percussion with normal breathing effort HEART: regular rate & rhythm and no murmurs and no lower extremity edema Musculoskeletal: no cyanosis of digits and no clubbing  PSYCH: alert & oriented x 3, fluent speech NEURO: no focal motor/sensory deficits   LABORATORY DATA:  I have reviewed the data as listed CBC Latest Ref Rng & Units 11/20/2020 11/19/2020 11/04/2020  WBC 4.0 - 10.5 K/uL 6.3 8.2 8.2  Hemoglobin 12.0 - 15.0 g/dL 11.4(L) 12.2 10.7(L)  Hematocrit 36.0 - 46.0 % 36.4 39.8 32.2(L)  Platelets 150 - 400 K/uL 231 252 413(H)    CMP Latest Ref Rng & Units 11/20/2020 11/19/2020 08/04/2020  Glucose 70 - 99 mg/dL 87 97 78  BUN 8 - 23 mg/dL 15 26(H) 19  Creatinine 0.44 - 1.00 mg/dL 0.84 0.97 0.89(H)  Sodium 135 - 145 mmol/L 137 144 141   Potassium 3.5 - 5.1 mmol/L 4.3 4.1 4.3  Chloride 98 - 111 mmol/L 105 109 101  CO2 22 - 32 mmol/L 24 25 26   Calcium 8.9 - 10.3 mg/dL 9.1 9.9 9.9  Total Protein 6.5 - 8.1 g/dL - 7.7 7.4  Total Bilirubin 0.3 - 1.2 mg/dL - 1.1 1.3(H)  Alkaline Phos  38 - 126 U/L - 62 -  AST 15 - 41 U/L - 17 21  ALT 0 - 44 U/L - 12 18     RADIOGRAPHIC STUDIES: I have personally reviewed the radiological images as listed and agreed with the findings in the report. IR IVC FILTER PLMT / S&I Burke Keels GUID/MOD SED  Result Date: 11/20/2020 INDICATION: 80 year old woman with prior history of DVT and be treated with Eliquis needs to stop anticoagulation due to upper GI bleed. Interventional radiology consulted for IVC filter placement. EXAM: Retrievable IVC filter placement MEDICATIONS: None. ANESTHESIA/SEDATION: Two mg IV Versed; 20 mcg IV Fentanyl Moderate Sedation Time:  13 minutes The patient was continuously monitored during the procedure by the interventional radiology nurse under my direct supervision. FLUOROSCOPY TIME:  Fluoroscopy Time: 2 minutes 30 seconds (65 mGy). COMPLICATIONS: None immediate. PROCEDURE: Informed written consent was obtained from the patient after a thorough discussion of the procedural risks, benefits and alternatives. All questions were addressed. Maximal Sterile Barrier Technique was utilized including caps, mask, sterile gowns, sterile gloves, sterile drape, hand hygiene and skin antiseptic. A timeout was performed prior to the initiation of the procedure. Patient positioned supine on the angiography table. Right neck prepped and draped in usual sterile fashion. All elements of maximal sterile barrier were utilized including, cap, mask, sterile gown, sterile gloves, large sterile drape, hand scrubbing and 2% Chlorhexidine for skin cleaning. The right internal jugular vein was evaluated with ultrasound and shown to be patent. A permanent ultrasound image was obtained and placed in the patient's  medical record. Using sterile gel and a sterile probe cover, the right internal jugular vein was entered with a 21 ga needle during real time ultrasound guidance. 21 gauge needle exchanged for a transitional dilator set over 0.018 inch guidewire. 0.035 inch guidewire advanced to the IVC under fluoroscopic guidance. Transitional dilator set exchanged for 10 French sheath over 0.035 inch guidewire. 10 Fr sheath placed to the infrarenal IVC and venogram performed to delineate the position of the renal veins. Denali retrievable IVC filter deployed in the infrarenal location. Hemostasis achieved with manual compression. IMPRESSION: Retrievable Denali filter placed in infra renal IVC. PLAN: This IVC filter is potentially retrievable. The patient will be assessed for filter retrieval by Interventional Radiology in approximately 8-12 weeks. Further recommendations regarding filter retrieval, continued surveillance or declaration of device permanence, will be made at that time. Electronically Signed   By: Miachel Roux M.D.   On: 11/20/2020 09:52   VAS [place] LOWER EXTREMITY VENOUS (DVT) (ONLY [facility] & [facility] 7a-7p)  Result Date: 11/19/2020  Lower Venous DVT Study Patient Name:  Kimberly Watson  Date of Exam:   11/19/2020 Medical Rec #: [id number]         Accession #:    [id number] Date of Birth: 04-May-1941         Patient Gender: F Patient Age:   79Y Exam Location:  [facility] Procedure:      VAS [place] LOWER EXTREMITY VENOUS (DVT) Referring Phys: [id number] CLAUDIA J GIBBONS --------------------------------------------------------------------------------  Indications: Swelling.  Risk Factors: None identified. Limitations: Poor ultrasound/tissue interface. Comparison Study: No prior studies. Performing Technologist: Oliver Hum RVT  Examination Guidelines: A complete evaluation includes B-mode imaging, spectral Doppler, color Doppler, and power Doppler as needed of all accessible portions of each vessel. Bilateral testing is  considered an integral part of a complete examination. Limited examinations for reoccurring indications may be performed as noted. The reflux portion of the exam is performed with  the patient in reverse Trendelenburg.  +---------+---------------+---------+-----------+----------+-------------------+ RIGHT    CompressibilityPhasicitySpontaneityPropertiesThrombus Aging      +---------+---------------+---------+-----------+----------+-------------------+ CFV      Full           Yes      Yes                                      +---------+---------------+---------+-----------+----------+-------------------+ SFJ      Full                                                             +---------+---------------+---------+-----------+----------+-------------------+ FV Prox  Full                                                             +---------+---------------+---------+-----------+----------+-------------------+ FV Mid   Full                                                             +---------+---------------+---------+-----------+----------+-------------------+ FV DistalFull                                                             +---------+---------------+---------+-----------+----------+-------------------+ PFV      Full                                                             +---------+---------------+---------+-----------+----------+-------------------+ POP      Full           Yes      Yes                                      +---------+---------------+---------+-----------+----------+-------------------+ PTV      Full                                                             +---------+---------------+---------+-----------+----------+-------------------+ PERO                                                  Not well visualized +---------+---------------+---------+-----------+----------+-------------------+    +---------+---------------+---------+-----------+----------+-------------------+ LEFT  CompressibilityPhasicitySpontaneityPropertiesThrombus Aging      +---------+---------------+---------+-----------+----------+-------------------+ CFV      Full           Yes      Yes                                      +---------+---------------+---------+-----------+----------+-------------------+ SFJ      Full                                                             +---------+---------------+---------+-----------+----------+-------------------+ FV Prox  None           No       No                   Acute               +---------+---------------+---------+-----------+----------+-------------------+ FV Mid   None           No       No                   Acute               +---------+---------------+---------+-----------+----------+-------------------+ FV DistalNone           No       No                   Acute               +---------+---------------+---------+-----------+----------+-------------------+ PFV      Full                                                             +---------+---------------+---------+-----------+----------+-------------------+ POP      None           No       No                   Acute               +---------+---------------+---------+-----------+----------+-------------------+ PTV      Full                                                             +---------+---------------+---------+-----------+----------+-------------------+ PERO                                                  Not well visualized +---------+---------------+---------+-----------+----------+-------------------+     Summary: RIGHT: - There is no evidence of deep vein thrombosis in the lower extremity.  - No cystic structure found in the popliteal fossa.  LEFT: - Findings consistent with acute deep vein thrombosis involving the left femoral vein, left  popliteal vein, and left gastrocnemius veins. - No cystic structure found in the popliteal fossa.  *See table(s) above for measurements and observations. Electronically signed by Jamelle Haring on 11/19/2020 at 5:23:55 PM.    Final     ASSESSMENT & PLAN Kimberly Watson 80 y.o. 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  #Acute LLE DVT in the Setting of GI Bleeding -- This is a difficult situation that requires conversation with the patient about the risks and benefits of all treatment options moving forward.  I spoke with the patient and her daughter via telephone about all the available options. --  Patient had an IVC filter placed last night.  -- EGD this morning showed a clean-based ulcer with no evidence of bleeding.  GI was recommending restarting antibiotic therapy. --  Recommend starting the patient on a heparin drip with no bolus and monitoring her clinically while she receives anticoagulation therapy. -- Modest drop in hemoglobin today down to 11.4 from 12.2 on admission. -- Hematology will continue to follow while the patient is in house.  All questions were answered. The patient knows to call the clinic with any problems, questions or concerns.  A total of more than 25 minutes were spent on this encounter and over half of that time was spent on counseling and coordination of care as outlined above.   Ledell Peoples, MD Department of Hematology/Oncology [place] at [facility] Phone: [phone] Pager: [phone] Email: [email or web address]  11/20/2020 2:05 PM

## 2020-11-20 NOTE — Progress Notes (Signed)
PROGRESS NOTE    Kimberly Watson  IDP:[id number] DOB: [birth year] DOA: 11/19/2020 PCP: Unk Pinto, MD    Brief Narrative:  80 y.o. 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  Patient is post to be undergoing work-up for possible upper GI source of bleeding.  She frequently uses NSAIDs.  She denies any chronic abdominal pain.  She denies any nausea or vomiting or bright red blood per rectum.  She denies any generalized weakness.  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 OK to start heparin gtt today without bolus.  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   -conitnued on heparin gtt without bolus per Hematology. Resume NOAC as per Hematology recs    Essential hypertension-home bp meds were held at time of presentation. BP remains well controlled    Myeloproliferative disorder (HCC)-stable at present. Would have pt f/u with hematology   DVT prophylaxis: Heparin  gtt Code Status: Full Family Communication: Pt in room, family at bedside  Status is: Inpatient  Remains inpatient appropriate because:Inpatient level of care appropriate due to severity of illness   Dispo: The patient is from: Home              Anticipated d/c is to: Home              Patient currently is not medically stable to d/c.   Difficult to place patient No       Consultants:   GI  Hematology  IR  Procedures:   IVC placement 4/22  EGD 4/23  Antimicrobials: Anti-infectives (From admission, onward)   None       Subjective: Reports feeling well  Objective: Vitals:   11/20/20 0839 11/20/20 0845 11/20/20 0856 11/20/20 1403  BP: (!) 142/88 113/80 135/76 115/74  Pulse: 65 65 68 88  Resp: 15 11 14 17   Temp: 97.6 F (36.4 C)   98.3 F (36.8 C)  TempSrc: Oral     SpO2: 100% 95% 95% 94%  Weight:      Height:        Intake/Output Summary (Last 24 hours) at 11/20/2020 1700 Last data filed at 11/20/2020 1523 Gross per 24 hour  Intake 740 ml  Output --  Net 740 ml   Filed Weights   11/19/20 1044 11/19/20 1910 11/20/20 0721  Weight: 79.8 kg 82.5 kg 79.8 kg    Examination: General exam: Awake, laying in bed, in nad Respiratory system: Normal respiratory  effort, no wheezing Cardiovascular system: regular rate, s1, s2 Gastrointestinal system: Soft, nondistended, positive BS Central nervous system: CN2-12 grossly intact, strength intact Extremities: Perfused, no clubbing Skin: Normal skin turgor, no notable skin lesions seen Psychiatry: Mood normal // no visual hallucinations   Data Reviewed: I have personally reviewed following labs and imaging studies  CBC: Recent Labs  Lab 11/19/20 1132 11/20/20 0920  WBC 8.2 6.3  NEUTROABS 5.3  --   HGB 12.2 11.4*  HCT 39.8 36.4  MCV 102.8* 101.4*  PLT 252 [id number]   Basic Metabolic Panel: Recent Labs  Lab 11/19/20 1236 11/20/20 0920  NA 144 137  K 4.1 4.3  CL 109 105  CO2 25 24  GLUCOSE 97 87   BUN 26* 15  CREATININE 0.97 0.84  CALCIUM 9.9 9.1   GFR: Estimated Creatinine Clearance: 53.5 mL/min (by C-G formula based on SCr of 0.84 mg/dL). Liver Function Tests: Recent Labs  Lab 11/19/20 1236  AST 17  ALT 12  ALKPHOS 62  BILITOT 1.1  PROT 7.7  ALBUMIN 4.0   No results for input(s): LIPASE, AMYLASE in the last 168 hours. No results for input(s): AMMONIA in the last 168 hours. Coagulation Profile: Recent Labs  Lab 11/20/20 1455  INR 1.1   Cardiac Enzymes: No results for input(s): CKTOTAL, CKMB, CKMBINDEX, TROPONINI in the last 168 hours. BNP (last 3 results) No results for input(s): PROBNP in the last 8760 hours. HbA1C: No results for input(s): HGBA1C in the last 72 hours. CBG: No results for input(s): GLUCAP in the last 168 hours. Lipid Profile: No results for input(s): CHOL, HDL, LDLCALC, TRIG, CHOLHDL, LDLDIRECT in the last 72 hours. Thyroid Function Tests: No results for input(s): TSH, T4TOTAL, FREET4, T3FREE, THYROIDAB in the last 72 hours. Anemia Panel: No results for input(s): VITAMINB12, FOLATE, FERRITIN, TIBC, IRON, RETICCTPCT in the last 72 hours. Sepsis Labs: No results for input(s): PROCALCITON, LATICACIDVEN in the last 168 hours.  Recent Results (from the past 240 hour(s))  Resp Panel by RT-PCR (Flu A&B, Covid) Nasopharyngeal Swab     Status: None   Collection Time: 11/19/20  3:02 PM   Specimen: Nasopharyngeal Swab; Nasopharyngeal(NP) swabs in vial transport medium  Result Value Ref Range Status   SARS Coronavirus 2 by RT PCR NEGATIVE NEGATIVE Final    Comment: (NOTE) SARS-CoV-2 target nucleic acids are NOT DETECTED.  The SARS-CoV-2 RNA is generally detectable in upper respiratory specimens during the acute phase of infection. The lowest concentration of SARS-CoV-2 viral copies this assay can detect is 138 copies/mL. A negative result does not preclude SARS-Cov-2 infection and should not be used as the sole basis for treatment or other  patient management decisions. A negative result may occur with  improper specimen collection/handling, submission of specimen other than nasopharyngeal swab, presence of viral mutation(s) within the areas targeted by this assay, and inadequate number of viral copies(<138 copies/mL). A negative result must be combined with clinical observations, patient history, and epidemiological information. The expected result is Negative.  Fact Sheet for Patients:  EntrepreneurPulse.com.au  Fact Sheet for Healthcare Providers:  IncredibleEmployment.be  This test is no t yet approved or cleared by the [place] FDA and  has been authorized for detection and/or diagnosis of SARS-CoV-2 by FDA under an Emergency Use Authorization (EUA). This EUA will remain  in effect (meaning this test can be used) for the duration of the COVID-19 declaration under Section 564(b)(1) of the Act, 21 U.S.C.section 360bbb-3(b)(1), unless the authorization is terminated  or  revoked sooner.       Influenza A by PCR NEGATIVE NEGATIVE Final   Influenza B by PCR NEGATIVE NEGATIVE Final    Comment: (NOTE) The Xpert Xpress SARS-CoV-2/FLU/RSV plus assay is intended as an aid in the diagnosis of influenza from Nasopharyngeal swab specimens and should not be used as a sole basis for treatment. Nasal washings and aspirates are unacceptable for Xpert Xpress SARS-CoV-2/FLU/RSV testing.  Fact Sheet for Patients: EntrepreneurPulse.com.au  Fact Sheet for Healthcare Providers: IncredibleEmployment.be  This test is not yet approved or cleared by the [place] FDA and has been authorized for detection and/or diagnosis of SARS-CoV-2 by FDA under an Emergency Use Authorization (EUA). This EUA will remain in effect (meaning this test can be used) for the duration of the COVID-19 declaration under Section 564(b)(1) of the Act, 21 U.S.C. section  360bbb-3(b)(1), unless the authorization is terminated or revoked.  Performed at [facility], [place] [address], [place], [place] [address]      Radiology Studies: IR IVC FILTER PLMT / S&I Burke Keels GUID/MOD SED  Result Date: 11/20/2020 INDICATION: 80 year old woman with prior history of DVT and be treated with Eliquis needs to stop anticoagulation due to upper GI bleed. Interventional radiology consulted for IVC filter placement. EXAM: Retrievable IVC filter placement MEDICATIONS: None. ANESTHESIA/SEDATION: Two mg IV Versed; 20 mcg IV Fentanyl Moderate Sedation Time:  13 minutes The patient was continuously monitored during the procedure by the interventional radiology nurse under my direct supervision. FLUOROSCOPY TIME:  Fluoroscopy Time: 2 minutes 30 seconds (65 mGy). COMPLICATIONS: None immediate. PROCEDURE: Informed written consent was obtained from the patient after a thorough discussion of the procedural risks, benefits and alternatives. All questions were addressed. Maximal Sterile Barrier Technique was utilized including caps, mask, sterile gowns, sterile gloves, sterile drape, hand hygiene and skin antiseptic. A timeout was performed prior to the initiation of the procedure. Patient positioned supine on the angiography table. Right neck prepped and draped in usual sterile fashion. All elements of maximal sterile barrier were utilized including, cap, mask, sterile gown, sterile gloves, large sterile drape, hand scrubbing and 2% Chlorhexidine for skin cleaning. The right internal jugular vein was evaluated with ultrasound and shown to be patent. A permanent ultrasound image was obtained and placed in the patient's medical record. Using sterile gel and a sterile probe cover, the right internal jugular vein was entered with a 21 ga needle during real time ultrasound guidance. 21 gauge needle exchanged for a transitional dilator set over 0.018 inch guidewire. 0.035 inch guidewire advanced  to the IVC under fluoroscopic guidance. Transitional dilator set exchanged for 10 French sheath over 0.035 inch guidewire. 10 Fr sheath placed to the infrarenal IVC and venogram performed to delineate the position of the renal veins. Denali retrievable IVC filter deployed in the infrarenal location. Hemostasis achieved with manual compression. IMPRESSION: Retrievable Denali filter placed in infra renal IVC. PLAN: This IVC filter is potentially retrievable. The patient will be assessed for filter retrieval by Interventional Radiology in approximately 8-12 weeks. Further recommendations regarding filter retrieval, continued surveillance or declaration of device permanence, will be made at that time. Electronically Signed   By: Miachel Roux M.D.   On: 11/20/2020 09:52   VAS [place] LOWER EXTREMITY VENOUS (DVT) (ONLY [facility] & [facility] 7a-7p)  Result Date: 11/19/2020  Lower Venous DVT Study Patient Name:  KAMRYNN MELOTT  Date of Exam:   11/19/2020 Medical Rec #: [id number]         Accession #:  [id number] Date of Birth: March 23, 1941         Patient Gender: F Patient Age:   79Y Exam Location:  [facility] Procedure:      VAS [place] LOWER EXTREMITY VENOUS (DVT) Referring Phys: [id number] CLAUDIA J GIBBONS --------------------------------------------------------------------------------  Indications: Swelling.  Risk Factors: None identified. Limitations: Poor ultrasound/tissue interface. Comparison Study: No prior studies. Performing Technologist: Oliver Hum RVT  Examination Guidelines: A complete evaluation includes B-mode imaging, spectral Doppler, color Doppler, and power Doppler as needed of all accessible portions of each vessel. Bilateral testing is considered an integral part of a complete examination. Limited examinations for reoccurring indications may be performed as noted. The reflux portion of the exam is performed with the patient in reverse Trendelenburg.   +---------+---------------+---------+-----------+----------+-------------------+ RIGHT    CompressibilityPhasicitySpontaneityPropertiesThrombus Aging      +---------+---------------+---------+-----------+----------+-------------------+ CFV      Full           Yes      Yes                                      +---------+---------------+---------+-----------+----------+-------------------+ SFJ      Full                                                             +---------+---------------+---------+-----------+----------+-------------------+ FV Prox  Full                                                             +---------+---------------+---------+-----------+----------+-------------------+ FV Mid   Full                                                             +---------+---------------+---------+-----------+----------+-------------------+ FV DistalFull                                                             +---------+---------------+---------+-----------+----------+-------------------+ PFV      Full                                                             +---------+---------------+---------+-----------+----------+-------------------+ POP      Full           Yes      Yes                                      +---------+---------------+---------+-----------+----------+-------------------+ PTV  Full                                                             +---------+---------------+---------+-----------+----------+-------------------+ PERO                                                  Not well visualized +---------+---------------+---------+-----------+----------+-------------------+   +---------+---------------+---------+-----------+----------+-------------------+ LEFT     CompressibilityPhasicitySpontaneityPropertiesThrombus Aging      +---------+---------------+---------+-----------+----------+-------------------+  CFV      Full           Yes      Yes                                      +---------+---------------+---------+-----------+----------+-------------------+ SFJ      Full                                                             +---------+---------------+---------+-----------+----------+-------------------+ FV Prox  None           No       No                   Acute               +---------+---------------+---------+-----------+----------+-------------------+ FV Mid   None           No       No                   Acute               +---------+---------------+---------+-----------+----------+-------------------+ FV DistalNone           No       No                   Acute               +---------+---------------+---------+-----------+----------+-------------------+ PFV      Full                                                             +---------+---------------+---------+-----------+----------+-------------------+ POP      None           No       No                   Acute               +---------+---------------+---------+-----------+----------+-------------------+ PTV      Full                                                             +---------+---------------+---------+-----------+----------+-------------------+  PERO                                                  Not well visualized +---------+---------------+---------+-----------+----------+-------------------+     Summary: RIGHT: - There is no evidence of deep vein thrombosis in the lower extremity.  - No cystic structure found in the popliteal fossa.  LEFT: - Findings consistent with acute deep vein thrombosis involving the left femoral vein, left popliteal vein, and left gastrocnemius veins. - No cystic structure found in the popliteal fossa.  *See table(s) above for measurements and observations. Electronically signed by Jamelle Haring on 11/19/2020 at 5:23:55 PM.    Final     Scheduled  Meds: . pantoprazole  40 mg Oral BID AC  . sodium chloride flush  3 mL Intravenous Q12H  . sucralfate  1 g Oral TID WC & HS   Continuous Infusions: . sodium chloride    . heparin 1,100 Units/hr (11/20/20 1523)     LOS: 1 day   Marylu Lund, MD [facility] Pager On Amion  If 7PM-7AM, please contact night-coverage 11/20/2020, 5:00 PM

## 2020-11-20 NOTE — Anesthesia Preprocedure Evaluation (Signed)
Anesthesia Evaluation  Patient identified by MRN, date of birth, ID band Patient awake    Reviewed: Allergy & Precautions, NPO status , Patient's Chart, lab work & pertinent test results  Airway Mallampati: II  TM Distance: >3 FB Neck ROM: Full    Dental  (+) Dental Advisory Given   Pulmonary neg pulmonary ROS,    breath sounds clear to auscultation       Cardiovascular hypertension, Pt. on medications and Pt. on home beta blockers + DVT (Hx of PE. Off eliquis for 2 weeks and now DVT s/p IVC filter 4/22)   Rhythm:Regular Rate:Normal     Neuro/Psych negative neurological ROS     GI/Hepatic Neg liver ROS, hiatal hernia, GERD  ,  Endo/Other  negative endocrine ROS  Renal/GU negative Renal ROS     Musculoskeletal  (+) Arthritis ,   Abdominal   Peds  Hematology  (+) Blood dyscrasia (blood clotting disorder- on elequis x 5 years), ,   Anesthesia Other Findings   Reproductive/Obstetrics                             Anesthesia Physical Anesthesia Plan  ASA: II  Anesthesia Plan: MAC   Post-op Pain Management:    Induction:   PONV Risk Score and Plan: 2 and Propofol infusion, Ondansetron and Treatment may vary due to age or medical condition  Airway Management Planned: Natural Airway and Nasal Cannula  Additional Equipment:   Intra-op Plan:   Post-operative Plan:   Informed Consent: I have reviewed the patients History and Physical, chart, labs and discussed the procedure including the risks, benefits and alternatives for the proposed anesthesia with the patient or authorized representative who has indicated his/her understanding and acceptance.       Plan Discussed with: CRNA  Anesthesia Plan Comments:         Anesthesia Quick Evaluation

## 2020-11-20 NOTE — Transfer of Care (Signed)
Immediate Anesthesia Transfer of Care Note  Patient: Kimberly Watson  Procedure(s) Performed: ESOPHAGOGASTRODUODENOSCOPY (EGD) WITH PROPOFOL (N/A )  Patient Location: PACU  Anesthesia Type:MAC  Level of Consciousness: drowsy and patient cooperative  Airway & Oxygen Therapy: Patient Spontanous Breathing and Patient connected to face mask oxygen  Post-op Assessment: Report given to RN and Post -op Vital signs reviewed and stable  Post vital signs: Reviewed and stable  Last Vitals:  Vitals Value Taken Time  BP    Temp    Pulse    Resp    SpO2      Last Pain:  Vitals:   11/20/20 0721  TempSrc: Tympanic  PainSc: 0-No pain         Complications: No complications documented.

## 2020-11-20 NOTE — Op Note (Addendum)
[facility] Patient Name: Kimberly Watson Procedure Date: 11/20/2020 MRN: [id number] Attending MD: Mauri Pole , MD Date of Birth: 1940-09-24 CSN: [id number] Age: 80 Admit Type: Inpatient Procedure:                Upper GI endoscopy Indications:              Recent gastrointestinal bleeding Providers:                Mauri Pole, MD, Particia Nearing, RN, Laverda Sorenson, Technician, Adair Laundry, CRNA Referring MD:              Medicines:                Monitored Anesthesia Care Complications:            No immediate complications. Estimated Blood Loss:     Estimated blood loss was minimal. Procedure:                Pre-Anesthesia Assessment:                           - Prior to the procedure, a History and Physical                            was performed, and patient medications and                            allergies were reviewed. The patient's tolerance of                            previous anesthesia was also reviewed. The risks                            and benefits of the procedure and the sedation                            options and risks were discussed with the patient.                            All questions were answered, and informed consent                            was obtained. Prior Anticoagulants: The patient has                            taken no previous anticoagulant or antiplatelet                            agents. ASA Grade Assessment: III - A patient with                            severe systemic disease. After reviewing the risks  and benefits, the patient was deemed in                            satisfactory condition to undergo the procedure.                           After obtaining informed consent, the endoscope was                            passed under direct vision. Throughout the                            procedure, the patient's blood pressure, pulse, and                             oxygen saturations were monitored continuously. The                            [id number]) Olympus gastroscope was                            introduced through the mouth, and advanced to the                            second part of duodenum. The upper GI endoscopy was                            accomplished without difficulty. The patient                            tolerated the procedure well. Scope In: Scope Out: Findings:      The Z-line was regular and was found 36 cm from the incisors.      No gross lesions were noted in the entire esophagus.      One non-bleeding cratered gastric ulcer with a clean ulcer base (Forrest       Class III) was found in the prepyloric region of the stomach. The lesion       was 5 mm in largest dimension. No endoscopic intervention is needed.      The cardia and gastric fundus were normal on retroflexion.      The examined duodenum was normal. Impression:               - Z-line regular, 36 cm from the incisors.                           - No gross lesions in esophagus.                           - Non-bleeding gastric ulcer with a clean ulcer                            base (Forrest Class III).                           - Normal  examined duodenum.                           - No specimens collected. Moderate Sedation:      Not Applicable - Patient had care per Anesthesia. Recommendation:           - Patient has a contact number available for                            emergencies. The signs and symptoms of potential                            delayed complications were discussed with the                            patient. Return to normal activities tomorrow.                            Written discharge instructions were provided to the                            patient.                           - Resume previous diet.                           - Continue present medications.                           - Use Protonix  (pantoprazole) 40 mg PO BID.                           - Use sucralfate tablets 1 gram PO QID for 1 month.                           - Ok to restart anticoagulation from GI standpoint                           - Monitor Hgb daily while inpatient and every 2                            weeks as outpatient                           - Avoid NSAID's                           - GI will sign off, please call with any questions Procedure Code(s):        --- Professional ---                           [phone], Esophagogastroduodenoscopy, flexible,                            transoral; diagnostic, including collection of  specimen(s) by brushing or washing, when performed                            (separate procedure) Diagnosis Code(s):        --- Professional ---                           K25.9, Gastric ulcer, unspecified as acute or                            chronic, without hemorrhage or perforation                           K92.2, Gastrointestinal hemorrhage, unspecified CPT copyright 2019 [employer]. All rights reserved. The codes documented in this report are preliminary and upon coder review may  be revised to meet current compliance requirements. Mauri Pole, MD 11/20/2020 8:41:48 AM This report has been signed electronically. Number of Addenda: 0

## 2020-11-20 NOTE — Progress Notes (Signed)
ANTICOAGULATION CONSULT NOTE - Initial Consult  Pharmacy Consult for: Heparin Drip Indication: DVT  Allergies  Allergen Reactions  . Evista [Raloxifene] Other (See Comments)    Blood clots - DVT and Pulmonary Embolism   . Fosamax [Alendronate Sodium]     esophagitis    Patient Measurements: Height: 5\' 3"  (160 cm) Weight: 79.8 kg (176 lb) IBW/kg (Calculated) : 52.4 Heparin Dosing Weight: 70 kg  Vital Signs: Temp: 98.3 F (36.8 C) (04/23 1403) Temp Source: Oral (04/23 0839) BP: 115/74 (04/23 1403) Pulse Rate: 88 (04/23 1403)  Labs: Recent Labs    11/19/20 1132 11/19/20 1236 11/20/20 0920  HGB 12.2  --  11.4*  HCT 39.8  --  36.4  PLT 252  --  231  CREATININE  --  0.97 0.84    Estimated Creatinine Clearance: 53.5 mL/min (by C-G formula based on SCr of 0.84 mg/dL).   Medical History: Past Medical History:  Diagnosis Date  . AKI (acute kidney injury) ([place]) 08/19/2015  . Cataract   . Clotting disorder ([place])   . DVT of lower extremity (deep venous thrombosis) ([place])    left, resolved  . Elevated hemoglobin A1c   . GERD (gastroesophageal reflux disease)   . H/O left knee surgery   . Hypertension   . JAK2 gene mutation   . Leiomyoma    adnexal Leiomyoma removal in 1/11  . 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 Nephrolithiasis   . Osteoporosis   . 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 Vascular ultrasound revealed acute VTE. Pharmacy consulted for IV UFH without bolus.    Goal of Therapy:  Heparin level 0.3-0.7 units/ml Monitor platelets by anticoagulation protocol: Yes   Plan:  No bolus.  Start heparin infusion at 1100 units/hr Check anti-Xa level in 8 hours and daily while on heparin Continue to monitor H&H and platelets  Ulice Dash D 11/20/2020,2:51 PM

## 2020-11-20 NOTE — Anesthesia Postprocedure Evaluation (Signed)
Anesthesia Post Note  Patient: Kimberly Watson  Procedure(s) Performed: ESOPHAGOGASTRODUODENOSCOPY (EGD) WITH PROPOFOL (N/A )     Patient location during evaluation: PACU Anesthesia Type: MAC Level of consciousness: awake and alert Pain management: pain level controlled Vital Signs Assessment: post-procedure vital signs reviewed and stable Respiratory status: spontaneous breathing, nonlabored ventilation, respiratory function stable and patient connected to nasal cannula oxygen Cardiovascular status: stable and blood pressure returned to baseline Postop Assessment: no apparent nausea or vomiting Anesthetic complications: no   No complications documented.  Last Vitals:  Vitals:   11/20/20 0845 11/20/20 0856  BP: 113/80 135/76  Pulse: 65 68  Resp: 11 14  Temp:    SpO2: 95% 95%    Last Pain:  Vitals:   11/20/20 0856  TempSrc:   PainSc: 0-No pain                 Tiajuana Amass

## 2020-11-21 DIAGNOSIS — I10 Essential (primary) hypertension: Secondary | ICD-10-CM | POA: Diagnosis not present

## 2020-11-21 DIAGNOSIS — I82412 Acute embolism and thrombosis of left femoral vein: Secondary | ICD-10-CM | POA: Diagnosis not present

## 2020-11-21 DIAGNOSIS — I82402 Acute embolism and thrombosis of unspecified deep veins of left lower extremity: Secondary | ICD-10-CM

## 2020-11-21 DIAGNOSIS — Z7901 Long term (current) use of anticoagulants: Secondary | ICD-10-CM | POA: Diagnosis not present

## 2020-11-21 LAB — HEPARIN LEVEL (UNFRACTIONATED)
Heparin Unfractionated: 0.41 IU/mL (ref 0.30–0.70)
Heparin Unfractionated: 0.74 IU/mL — ABNORMAL HIGH (ref 0.30–0.70)

## 2020-11-21 LAB — CBC
HCT: 35.4 % — ABNORMAL LOW (ref 36.0–46.0)
Hemoglobin: 10.8 g/dL — ABNORMAL LOW (ref 12.0–15.0)
MCH: 31.4 pg (ref 26.0–34.0)
MCHC: 30.5 g/dL (ref 30.0–36.0)
MCV: 102.9 fL — ABNORMAL HIGH (ref 80.0–100.0)
Platelets: 202 10*3/uL (ref 150–400)
RBC: 3.44 MIL/uL — ABNORMAL LOW (ref 3.87–5.11)
RDW: 13.9 % (ref 11.5–15.5)
WBC: 6.2 10*3/uL (ref 4.0–10.5)
nRBC: 0 % (ref 0.0–0.2)

## 2020-11-21 MED ORDER — APIXABAN 5 MG PO TABS
5.0000 mg | ORAL_TABLET | Freq: Two times a day (BID) | ORAL | Status: DC
  Administered 2020-11-21 – 2020-11-22 (×2): 5 mg via ORAL
  Filled 2020-11-21 (×2): qty 1

## 2020-11-21 NOTE — Progress Notes (Signed)
ANTICOAGULATION CONSULT NOTE - follow up  Pharmacy Consult for: Heparin Drip Indication: DVT  Allergies  Allergen Reactions  . Evista [Raloxifene] Other (See Comments)    Blood clots - DVT and Pulmonary Embolism   . Fosamax [Alendronate Sodium]     esophagitis    Patient Measurements: Height: 5\' 3"  (160 cm) Weight: 79.8 kg (176 lb) IBW/kg (Calculated) : 52.4 Heparin Dosing Weight: 70 kg  Vital Signs: Temp: 98.5 F (36.9 C) (04/23 2044) Temp Source: Oral (04/23 2044) BP: 114/65 (04/23 2046) Pulse Rate: 94 (04/23 2046)  Labs: Recent Labs    11/19/20 1132 11/19/20 1236 11/20/20 0920 11/20/20 1455 11/20/20 2352  HGB 12.2  --  11.4*  --   --   HCT 39.8  --  36.4  --   --   PLT 252  --  231  --   --   APTT  --   --   --  33  --   LABPROT  --   --   --  14.4  --   INR  --   --   --  1.1  --   HEPARINUNFRC  --   --   --   --  0.41  CREATININE  --  0.97 0.84  --   --     Estimated Creatinine Clearance: 53.5 mL/min (by C-G formula based on SCr of 0.84 mg/dL).   Medical History: Past Medical History:  Diagnosis Date  . AKI (acute kidney injury) ([place]) 08/19/2015  . Cataract   . Clotting disorder ([place])   . DVT of lower extremity (deep venous thrombosis) ([place])    left, resolved  . Elevated hemoglobin A1c   . GERD (gastroesophageal reflux disease)   . H/O left knee surgery   . Hypertension   . JAK2 gene mutation   . Leiomyoma    adnexal Leiomyoma removal in 1/11  . 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 Nephrolithiasis   . Osteoporosis   . 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 Vascular ultrasound revealed acute VTE. Pharmacy consulted for IV UFH without bolus.   HL 0.41 therapeutic on 1100 units/hr No bleeding per RN   Goal of Therapy:  Heparin level 0.3-0.7  units/ml Monitor platelets by anticoagulation protocol: Yes   Plan:  Continue heparin infusion at 1100 units/hr Confirmatory heparin level in 8 hours Daily CBC  Dolly Rias RPh 11/21/2020, 12:36 AM

## 2020-11-21 NOTE — Progress Notes (Signed)
   In [facility]

## 2020-11-21 NOTE — Progress Notes (Addendum)
ANTICOAGULATION CONSULT NOTE - follow up  Pharmacy Consult for: Heparin Drip Indication: DVT  Allergies  Allergen Reactions  . Evista [Raloxifene] Other (See Comments)    Blood clots - DVT and Pulmonary Embolism   . Fosamax [Alendronate Sodium]     esophagitis    Patient Measurements: Height: 5\' 3"  (160 cm) Weight: 79.8 kg (176 lb) IBW/kg (Calculated) : 52.4 Heparin Dosing Weight: 70 kg  Vital Signs: Temp: 98.4 F (36.9 C) (04/24 0906) Temp Source: Oral (04/24 0435) BP: 122/86 (04/24 0906) Pulse Rate: 99 (04/24 0906)  Labs: Recent Labs    11/19/20 1132 11/19/20 1236 11/20/20 0920 11/20/20 1455 11/20/20 2352 11/21/20 0754  HGB 12.2  --  11.4*  --   --  10.8*  HCT 39.8  --  36.4  --   --  35.4*  PLT 252  --  231  --   --  202  APTT  --   --   --  33  --   --   LABPROT  --   --   --  14.4  --   --   INR  --   --   --  1.1  --   --   HEPARINUNFRC  --   --   --   --  0.41 0.74*  CREATININE  --  0.97 0.84  --   --   --     Estimated Creatinine Clearance: 53.5 mL/min (by C-G formula based on SCr of 0.84 mg/dL).   Medical History: Past Medical History:  Diagnosis Date  . AKI (acute kidney injury) ([place]) 08/19/2015  . Cataract   . Clotting disorder ([place])   . DVT of lower extremity (deep venous thrombosis) ([place])    left, resolved  . Elevated hemoglobin A1c   . GERD (gastroesophageal reflux disease)   . H/O left knee surgery   . Hypertension   . JAK2 gene mutation   . Leiomyoma    adnexal Leiomyoma removal in 1/11  . 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 Nephrolithiasis   . Osteoporosis   . 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 Vascular ultrasound revealed acute VTE. Pharmacy consulted for IV UFH without bolus.   11/21/20 9:28 AM  - HL 0.74 slightly above goal -  CBC stable - no bleeding per RN   Goal of Therapy:  Heparin level 0.3-0.7 units/ml Monitor platelets by anticoagulation protocol: Yes   Plan:  Decrease heparin infusion to 1000 units/hr Confirmatory heparin level in 8 hours Daily CBC  Ulice Dash D  11/21/2020, 9:23 AM  Addendum  Plan is to transition to Eliquis. Per [facility], hematology is OK with Eliquis 5 mg bid. Will instruct RN to start tonight and d/c the heparin drip when giving the initial dose of Eliquis.   Ulice Dash D  11/21/20 3:22 PM

## 2020-11-21 NOTE — Progress Notes (Signed)
[place] [phone]   [phone]  PROGRESS NOTE  Patient Care Team: Unk Pinto, MD as PCP - General (Internal Medicine) Monna Fam, MD as Consulting Physician (Ophthalmology) Gatha Mayer, MD as Consulting Physician (Gastroenterology) Kathie Rhodes, MD (Inactive) as Consulting Physician (Urology) Cheri Fowler, MD as Consulting Physician (Obstetrics and Gynecology)  Interval History:  --IVC filter placed on admission.  -- Endoscopy performed yesterday showed evidence of clean-based nonbleeding ulcer.  GI recommended restarting anticoagulation therapy. -- mild hemorrhoidal bleeding reported, no other evidence of heavy/frank bleeding.  -- Hemoglobin dropped to 10.8 from 12.2 on admission. Vitally stable   MEDICAL HISTORY:  Past Medical History:  Diagnosis Date  . AKI (acute kidney injury) ([place]) 08/19/2015  . Cataract   . Clotting disorder ([place])   . DVT of lower extremity (deep venous thrombosis) ([place])    left, resolved  . Elevated hemoglobin A1c   . GERD (gastroesophageal reflux disease)   . H/O left knee surgery   . Hypertension   . JAK2 gene mutation   . Leiomyoma    adnexal Leiomyoma removal in 1/11  . 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 Nephrolithiasis   . Osteoporosis   . PE (pulmonary embolism) 08/2015    SURGICAL HISTORY: Past Surgical History:  Procedure Laterality Date  . CHOLECYSTECTOMY    . cyst removed off spine    . IR IVC FILTER PLMT / S&I /IMG GUID/MOD SED  11/19/2020  . IVC FILTER PLACEMENT ([facility] HX)  08/2015  . JOINT REPLACEMENT Left total knee  . TONSILLECTOMY    . UTERINE FIBROID SURGERY      SOCIAL HISTORY: Social History   Socioeconomic History  . Marital status: Divorced    Spouse name: Not on file  . Number of children: 2  . Years of education: Not on file  . Highest education level: Not on file  Occupational History  . Occupation: Pharmacist, hospital    Comment: Retired  Tobacco  Use  . Smoking status: Never Smoker  . Smokeless tobacco: Never Used  Vaping Use  . Vaping Use: Never used  Substance and Sexual Activity  . Alcohol use: No  . Drug use: No  . Sexual activity: Not on file    Comment: retired, next of kin, 1 son and daughter.  Other Topics Concern  . Not on file  Social History Narrative   Divorced, 2 children lives alone.  Lived with a smoker for years.    Retired Radio producer   No alcohol tobacco or drug use   10/08/2017      Social Determinants of Health   Financial Resource Strain: Not on file  Food Insecurity: Not on file  Transportation Needs: Not on file  Physical Activity: Not on file  Stress: Not on file  Social Connections: Not on file  Intimate Partner Violence: Not on file    FAMILY HISTORY: Family History  Problem Relation Age of Onset  . Hypertension Mother   . Diabetes Mother   . Breast cancer Mother   . Congestive Heart Failure Mother        Died age 42  . Osteoporosis Father   . Colon cancer Neg Hx   . Colon polyps Neg Hx   . Esophageal cancer Neg Hx   . Rectal cancer Neg Hx   . Stomach cancer Neg Hx     ALLERGIES:  is allergic to evista [raloxifene] and fosamax [alendronate sodium].  MEDICATIONS:  Current Facility-Administered  Medications  Medication Dose Route Frequency Provider Last Rate Last Admin  . 0.9 %  sodium chloride infusion  250 mL Intravenous PRN Phillips Grout, MD      . apixaban Arne Cleveland) tablet 5 mg  5 mg Oral BID Donne Hazel, MD      . heparin ADULT infusion 100 units/mL (25000 units/218mL)  1,000 Units/hr Intravenous Continuous Donne Hazel, MD 10 mL/hr at 11/21/20 1221 1,000 Units/hr at 11/21/20 1221  . pantoprazole (PROTONIX) EC tablet 40 mg  40 mg Oral BID AC Nandigam, Kavitha V, MD   40 mg at 11/21/20 1625  . sodium chloride flush (NS) 0.9 % injection 3 mL  3 mL Intravenous Q12H Derrill Kay A, MD   3 mL at 11/20/20 1406  . sodium chloride flush (NS) 0.9 % injection 3 mL  3 mL  Intravenous PRN Derrill Kay A, MD      . sucralfate (CARAFATE) tablet 1 g  1 g Oral TID WC & HS Nandigam, Venia Minks, MD   1 g at 11/21/20 1625    REVIEW OF SYSTEMS:   Constitutional: ( - ) fevers, ( - )  chills , ( - ) night sweats Eyes: ( - ) blurriness of vision, ( - ) double vision, ( - ) watery eyes Ears, nose, mouth, throat, and face: ( - ) mucositis, ( - ) sore throat Respiratory: ( - ) cough, ( - ) dyspnea, ( - ) wheezes Cardiovascular: ( - ) palpitation, ( - ) chest discomfort, ( - ) lower extremity swelling Gastrointestinal:  ( - ) nausea, ( - ) heartburn, ( - ) change in bowel habits Skin: ( - ) abnormal skin rashes Lymphatics: ( - ) new lymphadenopathy, ( - ) easy bruising Neurological: ( - ) numbness, ( - ) tingling, ( - ) new weaknesses Behavioral/Psych: ( - ) mood change, ( - ) new changes  All other systems were reviewed with the patient and are negative.  PHYSICAL EXAMINATION: Vitals:   11/21/20 0906 11/21/20 1419  BP: 122/86 113/82  Pulse: 99 98  Resp: 18 16  Temp: 98.4 F (36.9 C) 98.3 F (36.8 C)  SpO2: 95% 97%   Filed Weights   11/19/20 1044 11/19/20 1910 11/20/20 0721  Weight: 176 lb (79.8 kg) 181 lb 14.1 oz (82.5 kg) 176 lb (79.8 kg)    GENERAL: well appearing elderly Caucasian female in NAD  SKIN: skin color, texture, turgor are normal, no rashes or significant lesions EYES: conjunctiva are pink and non-injected, sclera clear LUNGS: clear to auscultation and percussion with normal breathing effort HEART: regular rate & rhythm and no murmurs and no lower extremity edema Musculoskeletal: no cyanosis of digits and no clubbing  PSYCH: alert & oriented x 3, fluent speech NEURO: no focal motor/sensory deficits   LABORATORY DATA:  I have reviewed the data as listed CBC Latest Ref Rng & Units 11/21/2020 11/20/2020 11/19/2020  WBC 4.0 - 10.5 K/uL 6.2 6.3 8.2  Hemoglobin 12.0 - 15.0 g/dL 10.8(L) 11.4(L) 12.2  Hematocrit 36.0 - 46.0 % 35.4(L) 36.4 39.8   Platelets 150 - 400 K/uL 202 231 252    CMP Latest Ref Rng & Units 11/20/2020 11/19/2020 08/04/2020  Glucose 70 - 99 mg/dL 87 97 78  BUN 8 - 23 mg/dL 15 26(H) 19  Creatinine 0.44 - 1.00 mg/dL 0.84 0.97 0.89(H)  Sodium 135 - 145 mmol/L 137 144 141  Potassium 3.5 - 5.1 mmol/L 4.3 4.1 4.3  Chloride 98 -  111 mmol/L 105 109 101  CO2 22 - 32 mmol/L 24 25 26   Calcium 8.9 - 10.3 mg/dL 9.1 9.9 9.9  Total Protein 6.5 - 8.1 g/dL - 7.7 7.4  Total Bilirubin 0.3 - 1.2 mg/dL - 1.1 1.3(H)  Alkaline Phos 38 - 126 U/L - 62 -  AST 15 - 41 U/L - 17 21  ALT 0 - 44 U/L - 12 18     RADIOGRAPHIC STUDIES: I have personally reviewed the radiological images as listed and agreed with the findings in the report. IR IVC FILTER PLMT / S&I Burke Keels GUID/MOD SED  Result Date: 11/20/2020 INDICATION: 80 year old woman with prior history of DVT and be treated with Eliquis needs to stop anticoagulation due to upper GI bleed. Interventional radiology consulted for IVC filter placement. EXAM: Retrievable IVC filter placement MEDICATIONS: None. ANESTHESIA/SEDATION: Two mg IV Versed; 20 mcg IV Fentanyl Moderate Sedation Time:  13 minutes The patient was continuously monitored during the procedure by the interventional radiology nurse under my direct supervision. FLUOROSCOPY TIME:  Fluoroscopy Time: 2 minutes 30 seconds (65 mGy). COMPLICATIONS: None immediate. PROCEDURE: Informed written consent was obtained from the patient after a thorough discussion of the procedural risks, benefits and alternatives. All questions were addressed. Maximal Sterile Barrier Technique was utilized including caps, mask, sterile gowns, sterile gloves, sterile drape, hand hygiene and skin antiseptic. A timeout was performed prior to the initiation of the procedure. Patient positioned supine on the angiography table. Right neck prepped and draped in usual sterile fashion. All elements of maximal sterile barrier were utilized including, cap, mask, sterile gown,  sterile gloves, large sterile drape, hand scrubbing and 2% Chlorhexidine for skin cleaning. The right internal jugular vein was evaluated with ultrasound and shown to be patent. A permanent ultrasound image was obtained and placed in the patient's medical record. Using sterile gel and a sterile probe cover, the right internal jugular vein was entered with a 21 ga needle during real time ultrasound guidance. 21 gauge needle exchanged for a transitional dilator set over 0.018 inch guidewire. 0.035 inch guidewire advanced to the IVC under fluoroscopic guidance. Transitional dilator set exchanged for 10 French sheath over 0.035 inch guidewire. 10 Fr sheath placed to the infrarenal IVC and venogram performed to delineate the position of the renal veins. Denali retrievable IVC filter deployed in the infrarenal location. Hemostasis achieved with manual compression. IMPRESSION: Retrievable Denali filter placed in infra renal IVC. PLAN: This IVC filter is potentially retrievable. The patient will be assessed for filter retrieval by Interventional Radiology in approximately 8-12 weeks. Further recommendations regarding filter retrieval, continued surveillance or declaration of device permanence, will be made at that time. Electronically Signed   By: Miachel Roux M.D.   On: 11/20/2020 09:52   VAS [place] LOWER EXTREMITY VENOUS (DVT) (ONLY [facility] & [facility] 7a-7p)  Result Date: 11/19/2020  Lower Venous DVT Study Patient Name:  Kimberly Watson  Date of Exam:   11/19/2020 Medical Rec #: [id number]         Accession #:    [id number] Date of Birth: Apr 30, 1941         Patient Gender: F Patient Age:   79Y Exam Location:  [facility] Procedure:      VAS [place] LOWER EXTREMITY VENOUS (DVT) Referring Phys: [id number] CLAUDIA J GIBBONS --------------------------------------------------------------------------------  Indications: Swelling.  Risk Factors: None identified. Limitations: Poor ultrasound/tissue interface. Comparison Study: No prior  studies. Performing Technologist: Oliver Hum RVT  Examination Guidelines: A complete evaluation includes B-mode imaging,  spectral Doppler, color Doppler, and power Doppler as needed of all accessible portions of each vessel. Bilateral testing is considered an integral part of a complete examination. Limited examinations for reoccurring indications may be performed as noted. The reflux portion of the exam is performed with the patient in reverse Trendelenburg.  +---------+---------------+---------+-----------+----------+-------------------+ RIGHT    CompressibilityPhasicitySpontaneityPropertiesThrombus Aging      +---------+---------------+---------+-----------+----------+-------------------+ CFV      Full           Yes      Yes                                      +---------+---------------+---------+-----------+----------+-------------------+ SFJ      Full                                                             +---------+---------------+---------+-----------+----------+-------------------+ FV Prox  Full                                                             +---------+---------------+---------+-----------+----------+-------------------+ FV Mid   Full                                                             +---------+---------------+---------+-----------+----------+-------------------+ FV DistalFull                                                             +---------+---------------+---------+-----------+----------+-------------------+ PFV      Full                                                             +---------+---------------+---------+-----------+----------+-------------------+ POP      Full           Yes      Yes                                      +---------+---------------+---------+-----------+----------+-------------------+ PTV      Full                                                              +---------+---------------+---------+-----------+----------+-------------------+ PERO  Not well visualized +---------+---------------+---------+-----------+----------+-------------------+   +---------+---------------+---------+-----------+----------+-------------------+ LEFT     CompressibilityPhasicitySpontaneityPropertiesThrombus Aging      +---------+---------------+---------+-----------+----------+-------------------+ CFV      Full           Yes      Yes                                      +---------+---------------+---------+-----------+----------+-------------------+ SFJ      Full                                                             +---------+---------------+---------+-----------+----------+-------------------+ FV Prox  None           No       No                   Acute               +---------+---------------+---------+-----------+----------+-------------------+ FV Mid   None           No       No                   Acute               +---------+---------------+---------+-----------+----------+-------------------+ FV DistalNone           No       No                   Acute               +---------+---------------+---------+-----------+----------+-------------------+ PFV      Full                                                             +---------+---------------+---------+-----------+----------+-------------------+ POP      None           No       No                   Acute               +---------+---------------+---------+-----------+----------+-------------------+ PTV      Full                                                             +---------+---------------+---------+-----------+----------+-------------------+ PERO                                                  Not well visualized +---------+---------------+---------+-----------+----------+-------------------+      Summary: RIGHT: - There is no evidence of deep vein thrombosis in the lower extremity.  - No cystic structure found in the popliteal fossa.  LEFT: - Findings  consistent with acute deep vein thrombosis involving the left femoral vein, left popliteal vein, and left gastrocnemius veins. - No cystic structure found in the popliteal fossa.  *See table(s) above for measurements and observations. Electronically signed by Jamelle Haring on 11/19/2020 at 5:23:55 PM.    Final     ASSESSMENT & PLAN Kimberly Watson 80 y.o. 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  #Acute LLE DVT in the Setting of GI Bleeding -- This is a difficult situation that required conversation with the patient about the risks and benefits of all treatment options moving forward.  I spoke with the patient and her daughter via telephone about all the available options. --  Patient had an IVC filter placed last night.  -- EGD this morning showed a clean-based ulcer with no evidence of bleeding.  GI was recommending restarting anticoagulation therapy. --  Recommend continuing on a heparin drip with no bolus and monitoring her clinically while she receives anticoagulation therapy. -- continued drop in hemoglobin today down to 10.8 from 12.2 on admission. No overt signs of bleeding.  -- Hematology will continue to follow while the patient is in house.  All questions were answered. The patient knows to call the clinic with any problems, questions or concerns.  A total of more than 25 minutes were spent on this encounter and over half of that time was spent on counseling and coordination of care as outlined above.   Ledell Peoples, MD Department of Hematology/Oncology [place] [facility] Phone: [phone] Pager: [phone] Email: [email or web address]  11/21/2020 6:02 PM

## 2020-11-21 NOTE — Progress Notes (Signed)
PROGRESS NOTE    PA TENNANT  VZD:[id number] DOB: 1940-11-23 DOA: 11/19/2020 PCP: Unk Pinto, MD    Brief Narrative:  80 y.o. 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  Patient is post to be undergoing work-up for possible upper GI source of bleeding.  She frequently uses NSAIDs.  She denies any chronic abdominal pain.  She denies any nausea or vomiting or bright red blood per rectum.  She denies any generalized weakness.  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 Pt had been continued on heparin gtt without bolus. Per Hematology, if pt remains stable on therapeutic dose of heparin, can transition to eliquis and possible d/c home. Have consulted PT as staff reports dizziness   Active Problems:   Acute blood loss anemia from GI bleed due to NSAIDs -GI consulted and pt underwent EGD 4/23, findings of non-bleeding gastric ulcer -GI recommendation for continued PPI and to avoid NSAIDs -Stable at this time. 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   -Tolerated therapeutic heparin overnight. Have placed pharmacy consult to transition to eliquis, per Hematology  recs    Essential hypertension-home bp meds were held at time of presentation. BP remains well controlled    Myeloproliferative disorder (HCC)-stable at present. Would have pt f/u with hematology   DVT prophylaxis: Heparin gtt, transition to eliquis Code Status: Full Family Communication: Pt in room, family currently not at bedside  Status is: Inpatient  Remains inpatient appropriate because:Inpatient level of care appropriate due to severity of illness   Dispo: The patient is from: Home              Anticipated d/c is to: Home              Patient currently is not medically stable to d/c.   Difficult to place patient No   Consultants:   GI  Hematology  IR  Procedures:   IVC placement 4/22  EGD 4/23  Antimicrobials: Anti-infectives (From admission, onward)   None      Subjective: States feeling better  Objective: Vitals:   11/20/20 2046 11/21/20 0435 11/21/20 0906 11/21/20 1419  BP: 114/65 118/68 122/86 113/82  Pulse: 94 79 99 98  Resp:  16 18 16   Temp:  98.4 F (36.9 C) 98.4 F (36.9 C) 98.3 F (36.8 C)  TempSrc:  Oral    SpO2: 96% 97% 95% 97%  Weight:      Height:        Intake/Output Summary (Last 24 hours) at 11/21/2020 1505 Last data filed at 11/21/2020 1300 Gross per 24 hour  Intake 487.69 ml  Output --  Net 487.69 ml  Filed Weights   11/19/20 1044 11/19/20 1910 11/20/20 0721  Weight: 79.8 kg 82.5 kg 79.8 kg    Examination: General exam: Conversant, in no acute distress Respiratory system: normal chest rise, clear, no audible wheezing Cardiovascular system: regular rhythm, s1-s2 Gastrointestinal system: Nondistended, nontender, pos BS Central nervous system: No seizures, no tremors Extremities: No cyanosis, no joint deformities Skin: No rashes, no pallor Psychiatry: Affect normal // no auditory hallucinations   Data Reviewed: I have personally reviewed following labs and imaging studies  CBC: Recent Labs  Lab 11/19/20 1132  11/20/20 0920 11/21/20 0754  WBC 8.2 6.3 6.2  NEUTROABS 5.3  --   --   HGB 12.2 11.4* 10.8*  HCT 39.8 36.4 35.4*  MCV 102.8* 101.4* 102.9*  PLT 252 231 784   Basic Metabolic Panel: Recent Labs  Lab 11/19/20 1236 11/20/20 0920  NA 144 137  K 4.1 4.3  CL 109 105  CO2 25 24  GLUCOSE 97 87  BUN 26* 15  CREATININE 0.97 0.84  CALCIUM 9.9 9.1   GFR: Estimated Creatinine Clearance: 53.5 mL/min (by C-G formula based on SCr of 0.84 mg/dL). Liver Function Tests: Recent Labs  Lab 11/19/20 1236  AST 17  ALT 12  ALKPHOS 62  BILITOT 1.1  PROT 7.7  ALBUMIN 4.0   No results for input(s): LIPASE, AMYLASE in the last 168 hours. No results for input(s): AMMONIA in the last 168 hours. Coagulation Profile: Recent Labs  Lab 11/20/20 1455  INR 1.1   Cardiac Enzymes: No results for input(s): CKTOTAL, CKMB, CKMBINDEX, TROPONINI in the last 168 hours. BNP (last 3 results) No results for input(s): PROBNP in the last 8760 hours. HbA1C: No results for input(s): HGBA1C in the last 72 hours. CBG: No results for input(s): GLUCAP in the last 168 hours. Lipid Profile: No results for input(s): CHOL, HDL, LDLCALC, TRIG, CHOLHDL, LDLDIRECT in the last 72 hours. Thyroid Function Tests: No results for input(s): TSH, T4TOTAL, FREET4, T3FREE, THYROIDAB in the last 72 hours. Anemia Panel: No results for input(s): VITAMINB12, FOLATE, FERRITIN, TIBC, IRON, RETICCTPCT in the last 72 hours. Sepsis Labs: No results for input(s): PROCALCITON, LATICACIDVEN in the last 168 hours.  Recent Results (from the past 240 hour(s))  Resp Panel by RT-PCR (Flu A&B, Covid) Nasopharyngeal Swab     Status: None   Collection Time: 11/19/20  3:02 PM   Specimen: Nasopharyngeal Swab; Nasopharyngeal(NP) swabs in vial transport medium  Result Value Ref Range Status   SARS Coronavirus 2 by RT PCR NEGATIVE NEGATIVE Final    Comment: (NOTE) SARS-CoV-2 target nucleic acids are NOT DETECTED.  The SARS-CoV-2 RNA is  generally detectable in upper respiratory specimens during the acute phase of infection. The lowest concentration of SARS-CoV-2 viral copies this assay can detect is 138 copies/mL. A negative result does not preclude SARS-Cov-2 infection and should not be used as the sole basis for treatment or other patient management decisions. A negative result may occur with  improper specimen collection/handling, submission of specimen other than nasopharyngeal swab, presence of viral mutation(s) within the areas targeted by this assay, and inadequate number of viral copies(<138 copies/mL). A negative result must be combined with clinical observations, patient history, and epidemiological information. The expected result is Negative.  Fact Sheet for Patients:  EntrepreneurPulse.com.au  Fact Sheet for Healthcare Providers:  IncredibleEmployment.be  This test is no t yet approved or cleared by the [place] FDA and  has been authorized for detection and/or diagnosis of SARS-CoV-2 by FDA under  an Emergency Use Authorization (EUA). This EUA will remain  in effect (meaning this test can be used) for the duration of the COVID-19 declaration under Section 564(b)(1) of the Act, 21 U.S.C.section 360bbb-3(b)(1), unless the authorization is terminated  or revoked sooner.       Influenza A by PCR NEGATIVE NEGATIVE Final   Influenza B by PCR NEGATIVE NEGATIVE Final    Comment: (NOTE) The Xpert Xpress SARS-CoV-2/FLU/RSV plus assay is intended as an aid in the diagnosis of influenza from Nasopharyngeal swab specimens and should not be used as a sole basis for treatment. Nasal washings and aspirates are unacceptable for Xpert Xpress SARS-CoV-2/FLU/RSV testing.  Fact Sheet for Patients: EntrepreneurPulse.com.au  Fact Sheet for Healthcare Providers: IncredibleEmployment.be  This test is not yet approved or cleared by the [place] FDA and has been authorized for detection and/or diagnosis of SARS-CoV-2 by FDA under an Emergency Use Authorization (EUA). This EUA will remain in effect (meaning this test can be used) for the duration of the COVID-19 declaration under Section 564(b)(1) of the Act, 21 U.S.C. section 360bbb-3(b)(1), unless the authorization is terminated or revoked.  Performed at [facility], [place] [address] [place], [place] [address]      Radiology Studies: IR IVC FILTER PLMT / S&I Burke Keels GUID/MOD SED  Result Date: 11/20/2020 INDICATION: 81 year old woman with prior history of DVT and be treated with Eliquis needs to stop anticoagulation due to upper GI bleed. Interventional radiology consulted for IVC filter placement. EXAM: Retrievable IVC filter placement MEDICATIONS: None. ANESTHESIA/SEDATION: Two mg IV Versed; 20 mcg IV Fentanyl Moderate Sedation Time:  13 minutes The patient was continuously monitored during the procedure by the interventional radiology nurse under my direct supervision. FLUOROSCOPY TIME:  Fluoroscopy Time: 2 minutes 30 seconds (65 mGy). COMPLICATIONS: None immediate. PROCEDURE: Informed written consent was obtained from the patient after a thorough discussion of the procedural risks, benefits and alternatives. All questions were addressed. Maximal Sterile Barrier Technique was utilized including caps, mask, sterile gowns, sterile gloves, sterile drape, hand hygiene and skin antiseptic. A timeout was performed prior to the initiation of the procedure. Patient positioned supine on the angiography table. Right neck prepped and draped in usual sterile fashion. All elements of maximal sterile barrier were utilized including, cap, mask, sterile gown, sterile gloves, large sterile drape, hand scrubbing and 2% Chlorhexidine for skin cleaning. The right internal jugular vein was evaluated with ultrasound and shown to be patent. A permanent ultrasound image was obtained and  placed in the patient's medical record. Using sterile gel and a sterile probe cover, the right internal jugular vein was entered with a 21 ga needle during real time ultrasound guidance. 21 gauge needle exchanged for a transitional dilator set over 0.018 inch guidewire. 0.035 inch guidewire advanced to the IVC under fluoroscopic guidance. Transitional dilator set exchanged for 10 French sheath over 0.035 inch guidewire. 10 Fr sheath placed to the infrarenal IVC and venogram performed to delineate the position of the renal veins. Denali retrievable IVC filter deployed in the infrarenal location. Hemostasis achieved with manual compression. IMPRESSION: Retrievable Denali filter placed in infra renal IVC. PLAN: This IVC filter is potentially retrievable. The patient will be assessed for filter retrieval by Interventional Radiology in approximately 8-12 weeks. Further recommendations regarding filter retrieval, continued surveillance or declaration of device permanence, will be made at that time. Electronically Signed   By: Miachel Roux M.D.   On: 11/20/2020 09:52    Scheduled Meds: . pantoprazole  40  mg Oral BID AC  . sodium chloride flush  3 mL Intravenous Q12H  . sucralfate  1 g Oral TID WC & HS   Continuous Infusions: . sodium chloride    . heparin 1,000 Units/hr (11/21/20 1221)     LOS: 2 days   Marylu Lund, MD [facility] Pager On Amion  If 7PM-7AM, please contact night-coverage 11/21/2020, 3:05 PM

## 2020-11-22 ENCOUNTER — Telehealth: Payer: Self-pay

## 2020-11-22 ENCOUNTER — Encounter (HOSPITAL_COMMUNITY): Payer: Self-pay | Admitting: Gastroenterology

## 2020-11-22 ENCOUNTER — Ambulatory Visit: Payer: Medicare PPO | Admitting: Internal Medicine

## 2020-11-22 DIAGNOSIS — D471 Chronic myeloproliferative disease: Secondary | ICD-10-CM

## 2020-11-22 DIAGNOSIS — T39395A Adverse effect of other nonsteroidal anti-inflammatory drugs [NSAID], initial encounter: Secondary | ICD-10-CM

## 2020-11-22 DIAGNOSIS — I82412 Acute embolism and thrombosis of left femoral vein: Secondary | ICD-10-CM | POA: Diagnosis not present

## 2020-11-22 DIAGNOSIS — K250 Acute gastric ulcer with hemorrhage: Secondary | ICD-10-CM

## 2020-11-22 DIAGNOSIS — K922 Gastrointestinal hemorrhage, unspecified: Secondary | ICD-10-CM

## 2020-11-22 LAB — CBC
HCT: 34.9 % — ABNORMAL LOW (ref 36.0–46.0)
Hemoglobin: 10.6 g/dL — ABNORMAL LOW (ref 12.0–15.0)
MCH: 31.2 pg (ref 26.0–34.0)
MCHC: 30.4 g/dL (ref 30.0–36.0)
MCV: 102.6 fL — ABNORMAL HIGH (ref 80.0–100.0)
Platelets: 203 10*3/uL (ref 150–400)
RBC: 3.4 MIL/uL — ABNORMAL LOW (ref 3.87–5.11)
RDW: 14 % (ref 11.5–15.5)
WBC: 6.6 10*3/uL (ref 4.0–10.5)
nRBC: 0 % (ref 0.0–0.2)

## 2020-11-22 MED ORDER — PANTOPRAZOLE SODIUM 40 MG PO TBEC: 40.0000 mg | DELAYED_RELEASE_TABLET | Freq: Two times a day (BID) | ORAL | 0 refills | Status: DC

## 2020-11-22 MED ORDER — SUCRALFATE 1 G PO TABS: 1.0000 g | ORAL_TABLET | Freq: Three times a day (TID) | ORAL | 0 refills | Status: DC

## 2020-11-22 MED ORDER — APIXABAN 5 MG PO TABS: 5.0000 mg | ORAL_TABLET | Freq: Two times a day (BID) | ORAL | 0 refills | Status: DC

## 2020-11-22 NOTE — Progress Notes (Addendum)
[place] OFFICE PROGRESS NOTE  Patient Care Team: Unk Pinto, MD as PCP - General (Internal Medicine) Monna Fam, MD as Consulting Physician (Ophthalmology) Gatha Mayer, MD as Consulting Physician (Gastroenterology) Kathie Rhodes, MD (Inactive) as Consulting Physician (Urology) Meisinger, Sherren Mocha, MD as Consulting Physician (Obstetrics and Gynecology)  I have seen the patient, examined her and agree with documentation as follows  ASSESSMENT & PLAN:  Myeloproliferative disorder [facility]) She has tolerated hydroxyurea very well Currently on hold I plan to see her in 3 days in the outpatient setting and recheck her labs We will resume hydroxyurea at that point in time   Acute LLE DVT/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 She has been on chronic anticoagulation therapy, but developed GI bleed Status post IVC filter placement by IR on 11/19/2020 She was initially started on heparin and transitioned to Eliquis without recurrent bleed Will plan to continue Eliquis, she is aware to watch closely for recurrent bleed IVC filter will need to remain in place for at least 3 months, possibly longer if she elects to have knee surgery I will discuss long-term plan with the patient  GI bleed Secondary to NSAID use Status post EGD on 11/20/2020 which showed a nonbleeding gastric ulcer She was cleared from a GI standpoint to resume anticoagulation Monitor for recurrent bleed I will monitor her blood count carefully in the outpatient  Anemia Secondary to GI bleed Has not required transfusion this admission Hemoglobin stable today   Essential hypertension Currently off her blood pressure medications Blood pressure is currently well controlled She will follow-up with her primary care provider after discharge for management of her hypertension  Discharge planning The patient is stable from our standpoint for  discharge Outpatient follow-up has been scheduled for 11/25/2020-lab scheduled at 1230 and visit at 1 PM   All questions were answered. The patient knows to call the clinic with any problems, questions or concerns.   [name], NP 11/22/2020 8:23 AM [name], MD  INTERVAL HISTORY: Reports mild swelling in her lower extremities No bleeding reported this am  Was taking NSAIDS at home States she has been taking her medications on an empty stomach recently  SUMMARY OF ONCOLOGIC HISTORY:  Kimberly Watson was referred here after recent diagnosis of severe left lower extremity DVT with bilateral PE and cor pulmonale. I reviewed her records extensively and collaborated the history with the patient. The patient has been taking Evista for over 25 years due to strong family history of breast cancer and for osteoporosis management. In January 2017, she had viral gastroenteritis with severe diarrhea and dehydration. The patient has been taking Lasix chronically for mild bilateral lower extremity edema, left greater than the right. 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 She recalled having profuse diarrhea nonstop for 24 hours leading up to the blood clot event. Of note, prior to the diagnosis, she was not taking aspirin consistently  Prior to that she denies recent history of trauma, long distance travel, recent surgery, smoking or prolonged immobilization. She had no prior history or diagnosis of cancer. Her age appropriate screening programs are up-to-date. She had prior surgeries before and never had perioperative thromboembolic events. The patient had been exposed to birth control pills and never had thrombotic events. The patient had been pregnant before and denies history of peripartum thromboembolic event or history of recurrent miscarriages. There is no family history of blood  clots or miscarriages.  On 08/18/2015, she presented to  the emergency department due to symptoms of severe weakness, chest pressure, shortness of breath and near syncopal episode. She had extensive evaluation in the emergency room. D-dimer was elevated at greater than 20.  CT angiogram dated 08/19/2015 show massive saddle pulmonary embolus with evidence of severe right heart strain.  Left lower extremity venous Doppler on 08/19/2015 showed findings consistent with acute deep vein thrombosis involving the left femoral vein, left popliteal vein, left posterior tibial vein and left peroneal vein.  Echocardiogram on 08/19/2015 show preserved ejection fraction but with right ventricular dilatation, moderate tricuspid valvular regurgitation, oscillating density on TV(thought to be blood clot in transit) and moderate to severe increased right systolic pressure/evidence of severe pulmonary hypertension  The patient was admitted with IV heparin anticoagulation therapy and placement of IVC filter on 08/20/2015.  Echocardiogram was repeated on 08/23/2015 which showed persistent elevated pulmonary hypertension. The mass over the right tricuspid valve is no longer visible She was hospitalized on 08/18/2015 to 08/24/2015. She was discharged home on Eliquis.  Evista was discontinued. 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 On 09/17/2017, she started taking hydroxyurea 500 mg daily  REVIEW OF SYSTEMS:   Constitutional: Denies fevers, chills or abnormal weight loss Eyes: Denies blurriness of vision Ears, nose, mouth, throat, and face: Denies mucositis or sore throat Respiratory: Denies cough, dyspnea or wheezes Cardiovascular: Denies palpitation, chest discomfort or lower extremity swelling Gastrointestinal:   Denies nausea, heartburn or change in bowel habits Skin: Denies abnormal skin rashes Lymphatics: Denies new lymphadenopathy or easy bruising Neurological:Denies numbness, tingling or new weaknesses Behavioral/Psych: Mood is stable, no new changes  All other systems were reviewed with the patient and are negative.  I have reviewed the past medical history, past surgical history, social history and family history with the patient and they are unchanged from previous note.  ALLERGIES:  is allergic to evista [raloxifene] and fosamax [alendronate sodium].  MEDICATIONS:  Current Facility-Administered Medications  Medication Dose Route Frequency Provider Last Rate Last Admin   0.9 %  sodium chloride infusion  250 mL Intravenous PRN Phillips Grout, MD       apixaban Arne Cleveland) tablet 5 mg  5 mg Oral BID Donne Hazel, MD   5 mg at 11/21/20 2043   pantoprazole (PROTONIX) EC tablet 40 mg  40 mg Oral BID AC Nandigam, Kavitha V, MD   40 mg at 11/21/20 1625   sodium chloride flush (NS) 0.9 % injection 3 mL  3 mL Intravenous Q12H Derrill Kay A, MD   3 mL at 11/21/20 2043   sodium chloride flush (NS) 0.9 % injection 3 mL  3 mL Intravenous PRN Phillips Grout, MD       sucralfate (CARAFATE) tablet 1 g  1 g Oral TID WC & HS Harl Bowie V, MD   1 g at 11/21/20 2043    PHYSICAL EXAMINATION: ECOG PERFORMANCE STATUS: 1 - Symptomatic but completely ambulatory  Vitals:   11/21/20 1419 11/21/20 2048  BP: 113/82 122/83  Pulse: 98 (!) 109  Resp: 16 18  Temp: 98.3 F (36.8 C) 98.7 F (37.1 C)  SpO2: 97% 95%   Filed Weights   11/19/20 1044 11/19/20 1910 11/20/20 0721  Weight: 79.8 kg 82.5 kg 79.8 kg  GENERAL:alert, no distress and comfortable EXT: Trace BLE edema NEURO: alert & oriented x 3 with fluent speech, no focal motor/sensory deficits  LABORATORY DATA:  I have reviewed the data as listed    Component Value Date/Time   NA 137 11/20/2020 0920   K 4.3 11/20/2020 0920   CL 105  11/20/2020 0920   CO2 24 11/20/2020 0920   GLUCOSE 87 11/20/2020 0920   BUN 15 11/20/2020 0920   CREATININE 0.84 11/20/2020 0920   CREATININE 0.89 (H) 08/04/2020 1549   CALCIUM 9.1 11/20/2020 0920   PROT 7.7 11/19/2020 1236   ALBUMIN 4.0 11/19/2020 1236   AST 17 11/19/2020 1236   ALT 12 11/19/2020 1236   ALKPHOS 62 11/19/2020 1236   BILITOT 1.1 11/19/2020 1236   GFRNONAA >60 11/20/2020 0920   GFRNONAA 61 08/04/2020 1549   GFRAA 71 08/04/2020 1549    No results found for: SPEP, UPEP  Lab Results  Component Value Date   WBC 6.6 11/22/2020   NEUTROABS 5.3 11/19/2020   HGB 10.6 (L) 11/22/2020   HCT 34.9 (L) 11/22/2020   MCV 102.6 (H) 11/22/2020   PLT 203 11/22/2020      Chemistry      Component Value Date/Time   NA 137 11/20/2020 0920   K 4.3 11/20/2020 0920   CL 105 11/20/2020 0920   CO2 24 11/20/2020 0920   BUN 15 11/20/2020 0920   CREATININE 0.84 11/20/2020 0920   CREATININE 0.89 (H) 08/04/2020 1549      Component Value Date/Time   CALCIUM 9.1 11/20/2020 0920   ALKPHOS 62 11/19/2020 1236   AST 17 11/19/2020 1236   ALT 12 11/19/2020 1236   BILITOT 1.1 11/19/2020 1236

## 2020-11-22 NOTE — Plan of Care (Signed)

## 2020-11-22 NOTE — Care Management Important Message (Signed)
Important Message  Patient Details IM Letter given to the Patient. Name: Kimberly Watson MRN: [id number] Date of Birth: [birth year]   Medicare Important Message Given:  Yes     Kerin Salen 11/22/2020, 11:09 AM

## 2020-11-22 NOTE — Telephone Encounter (Signed)
Called and scheduled appts for this Thursday. She is aware of appt date and time.

## 2020-11-22 NOTE — Evaluation (Signed)
Physical Therapy Evaluation Patient Details Name: Kimberly Watson MRN: [id number] DOB: 1940/12/03 Today's Date: 11/22/2020   History of Present Illness  80 y.o. 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  Clinical Impression  Patient is mobilizing well, when not holding to RW noted to be "cruising", holding to wall, objects. Recommend rollator for safe ambulation.  Patient does acknowledge that lower left leg having a "catch" while ambulating.  Patient will have family present at DC.  Pt admitted with above diagnosis.  Pt currently with functional limitations due to the deficits listed below (see PT Problem List). Pt will benefit from skilled PT to increase their independence and safety with mobility to allow discharge to the venue listed below.       Follow Up Recommendations No PT follow up    Equipment Recommendations  Other (comment) (rollator)    Recommendations for Other Services       Precautions / Restrictions Precautions Precautions: Fall      Mobility  Bed Mobility Overal bed mobility: Independent                  Transfers Overall transfer level: Modified independent               General transfer comment: tends to hold bed, rail in BR.  Ambulation/Gait Ambulation/Gait assistance: Min guard Gait Distance (Feet): 200 Feet Assistive device: Rolling walker (2 wheeled) Gait Pattern/deviations: Step-through pattern;Antalgic Gait velocity: decr   General Gait Details: noted LLE antalgia, when not using RW reaches for walls and objects for support  Stairs            Wheelchair Mobility    Modified Rankin (Stroke Patients Only)       Balance Overall balance assessment: Needs assistance Sitting-balance support: Feet  supported;No upper extremity supported Sitting balance-Leahy Scale: Normal     Standing balance support: During functional activity;No upper extremity supported Standing balance-Leahy Scale: Fair                               Pertinent Vitals/Pain Pain Assessment: Faces Faces Pain Scale: Hurts little more Pain Location: left lower leg Pain Descriptors / Indicators: Cramping Pain Intervention(s): Monitored during session    Home Living Family/patient expects to be discharged to:: Private residence Living Arrangements: Alone;Other relatives Available Help at Discharge: Family   Home Access: Stairs to enter   Entrance Stairs-Number of Steps: 1 Home Layout: One level Home Equipment: None      Prior Function Level of Independence: Independent               Hand Dominance   Dominant Hand: Right    Extremity/Trunk Assessment   Upper Extremity Assessment Upper Extremity Assessment: Overall WFL for tasks assessed    Lower Extremity Assessment Lower Extremity Assessment: Overall WFL for tasks assessed;LLE deficits/detail LLE Deficits / Details: edema noted in lower leg       Communication   Communication: No difficulties  Cognition Arousal/Alertness: Awake/alert Behavior During Therapy: WFL for tasks assessed/performed Overall Cognitive Status: Within Functional Limits for tasks assessed  General Comments      Exercises     Assessment/Plan    PT Assessment Patient needs continued PT services  PT Problem List Decreased strength;Decreased mobility;Decreased activity tolerance;Pain       PT Treatment Interventions DME instruction;Therapeutic activities;Gait training;Functional mobility training;Therapeutic exercise;Patient/family education    PT Goals (Current goals can be found in the Care Plan section)  Acute Rehab PT Goals Patient Stated Goal: go home PT Goal Formulation: With  patient Time For Goal Achievement: 12/06/20 Potential to Achieve Goals: Good    Frequency Min 3X/week   Barriers to discharge        Co-evaluation               AM-PAC PT "6 Clicks" Mobility  Outcome Measure Help needed turning from your back to your side while in a flat bed without using bedrails?: None Help needed moving from lying on your back to sitting on the side of a flat bed without using bedrails?: None Help needed moving to and from a bed to a chair (including a wheelchair)?: A Little Help needed standing up from a chair using your arms (e.g., wheelchair or bedside chair)?: A Little Help needed to walk in hospital room?: A Little Help needed climbing 3-5 steps with a railing? : A Little 6 Click Score: 20    End of Session   Activity Tolerance: Patient tolerated treatment well Patient left: in chair;with call bell/phone within reach Nurse Communication: Mobility status PT Visit Diagnosis: Difficulty in walking, not elsewhere classified (R26.2);Pain Pain - Right/Left: Left Pain - part of body: Leg    Time: 0998-3382 PT Time Calculation (min) (ACUTE ONLY): 23 min   Charges:     PT Treatments $Gait Training: 8-22 mins        [place] Pager [phone] Office [phone]   Claretha Cooper 11/22/2020, 10:56 AM

## 2020-11-22 NOTE — Discharge Instructions (Addendum)
Venous Thromboembolism Prevention Venous thromboembolism (VTE) is a condition in which a blood clot (thrombus) develops in the body. A deep vein thrombosis (DVT) is a blood clot that usually occurs in a deep vein in the leg or the pelvis, but it can also occur in the arm. Sometimes, pieces of a thrombus can break off and travel through the bloodstream to other parts of the body. When that happens, the thrombus is called an embolus. An embolus that travels to the lungs is called a pulmonary embolism. An embolism can block the blood flow in the blood vessels of other organs as well. How can this condition affect me? VTE is a serious health condition that can cause disability or death. It is very important to get help right away. Do not ignore your symptoms. What can increase my risk? You are more likely to develop this condition if you:  Have had recent major surgery or trauma.  Have certain health conditions, such as cancer.  Are in the hospital for a sudden illness.  Have not moved for a long period of time, such as if you are on bed rest or traveling a long distance.  Take certain medicines, such as birth control pills or hormone replacement therapy.  Are pregnant or recently gave birth.  Have a personal or family history of VTE.  Are overweight.  Are 14 years of age or older.  Use products that contain nicotine and tobacco. What actions can I take to prevent this? In the hospital A VTE may be prevented by taking medicines that are prescribed to prevent blood clots (anticoagulants). You can also help to prevent VTE while in the hospital by taking these actions:  Get out of bed and walk. Ask your health care provider if this is safe for you to do.  Ask your health care provider if you should use a sequential compression device (SCD). This is a machine that pumps air into compression sleeves that are wrapped around your legs.  Ask your health care provider if you should wear tight,  elastic stockings that apply pressure to the lower legs (compression stockings). Compression stockings are sometimes used with SCDs. At home after surgery Understand that you have an increased risk for VTE for the first 4-6 weeks after surgery. During this time:  Avoid traveling for more than 4 hours. If you must travel after surgery, ask your health care provider about additional preventive actions that you can take.  Avoid sitting or lying still for too long. If possible, get up and walk around one time every hour. Ask your health care provider when this is safe for you to do. While traveling Travel that takes more than 4 hours can increase the risk of a VTE. To prevent VTE when traveling:  Exercise your arms and legs every hour. You can do this by standing, stretching, and bending and straightening your arms and legs. If you are traveling by airplane, train, or bus, walk up and down the aisle as often as possible to get your blood moving. If you are traveling by car, stop and get out of the car every hour to exercise your arms and legs and stretch. Other types of exercise might include: ? Keeping your feet flat on the ground and raising your toes. ? Switching from tightening the muscles in your calves and thighs to relaxing those same muscles while you are sitting. ? Pointing and flexing your feet at the ankle joints while you are sitting.  Drink enough  water to keep your urine pale yellow.  Avoid drinking alcohol during long travel. Generally, it is not recommended that you take medicines to prevent DVT during routine travel.   Activity  Stay active. Avoid sitting or lying in bed for long periods of time without moving your arms and legs.  Exercise by moving your arms and legs for 30 minutes or more every day.  Try not to bump or injure your legs.  Avoid crossing your legs when you are sitting.   Lifestyle  Do not use any products that contain nicotine or tobacco, such as  cigarettes, e-cigarettes, and chewing tobacco. If you need help quitting, ask your health care provider. This is especially important if you take estrogen medicines.  Avoid wearing tight clothing around your legs or waist. General information  Wear compression stockings as told by your health care provider. These stockings help to prevent blood clots and reduce swelling in your legs. Do not let them bunch up when you are wearing them.  Avoid using medicines that contain estrogen if you do not need them. These include birth control pills and hormone replacement therapy.  Do not use pillows under your knees while lying down unless told by your health care provider.  Maintain a healthy weight.   Where to find more information  Centers for Disease Control and Prevention: www.cdc.gov  American Heart Association: www.heart.org Get help right away if:  You have chest pain or shortness of breath.  You cough up blood.  You have a rapid or irregular heartbeat.  You feel light-headed or dizzy.  You have new or increased pain, swelling, or redness in an arm or leg.  You have numbness or tingling in an arm or leg.  You have blood in your vomit, stool, or urine. These symptoms may represent a serious problem that is an emergency. Do not wait to see if the symptoms will go away. Get medical help right away. Call your local emergency services (911 in the [place] Do not drive yourself to the hospital. Summary  Venous thromboembolism (VTE) is a condition in which a blood clot (thrombus)develops in the body. A deep vein thrombosis (DVT) is a blood clot that usually occurs in a deep vein in the leg or the pelvis, but it can also occur in the arm.  VTE is a serious health condition that can cause disability or death. It is very important to get help right away. Do not ignore your symptoms.  If you are traveling, exercise your arms and legs every hour.  Stay active to help prevent blood clots.  Avoid sitting or lying in bed for long periods of time without moving your arms and legs. This information is not intended to replace advice given to you by your health care provider. Make sure you discuss any questions you have with your health care provider. Document Revised: 11/05/2018 Document Reviewed: 09/24/2018 [employer] Patient Education  2021 [employer]  

## 2020-11-22 NOTE — Discharge Summary (Signed)
Physician Discharge Summary  Kimberly Watson [id number] DOB: 1940-08-03 DOA: 11/19/2020  PCP: Unk Pinto, MD  Admit date: 11/19/2020 Discharge date: 11/22/2020  Admitted From: Home Disposition:  Home  Recommendations for Outpatient Follow-up:  1. Follow up with PCP in 2-3 weeks 2. 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 Patient is post to be undergoing work-up for possible upper GI source of bleeding. She frequently uses NSAIDs. She denies any chronic abdominal pain. She denies any nausea or vomiting or bright red blood per rectum. She denies any generalized weakness. 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 Pt had been continued on heparin gtt without bolus.  -Remained stable and eliquis was resumed Cleared by PT to d/c  Active Problems:  Acute blood loss anemia fromGI bleed due to NSAIDs -GI consulted and pt underwent EGD 4/23, findings of non-bleeding gastric ulcer -GI recommendation for continued PPI and to avoid NSAIDs  Chronic anticoagulation (apixaban)  due to DVT/PE- -Eliquis was on hold initially  -remained stable, resumed eliquis at time of d/c  Essential hypertension-home bp meds were held at time of presentation. BP remains well controlled  Myeloproliferative disorder (HCC)-stable at present. Would have pt f/u with hematology   Discharge Instructions   Allergies as of 11/22/2020      Reactions   Evista [raloxifene] Other (See Comments)   Blood clots - DVT and Pulmonary Embolism    Fosamax [alendronate Sodium]    esophagitis      Medication List    STOP taking these medications   amoxicillin 500 MG capsule Commonly known as: AMOXIL   furosemide 40 MG tablet Commonly known as: LASIX   hydroxyurea 500 MG capsule Commonly known as: HYDREA   metoprolol succinate 100 MG 24 hr tablet Commonly known as: TOPROL-XL   omeprazole 20 MG capsule Commonly known as: PRILOSEC     TAKE these medications   apixaban 5 MG Tabs tablet Commonly known as: ELIQUIS Take 1 tablet (5 mg total) by mouth 2 (two) times daily. What changed:   medication strength  See the new instructions.   calcium carbonate 600 MG Tabs tablet Commonly known as: OS-CAL Take 600 mg by mouth daily with breakfast.   Dialyvite Vitamin D 5000 125 MCG (5000 UT) capsule Generic drug: Cholecalciferol Take 5,000 Units by mouth daily.   ferrous sulfate 325 (65 FE) MG tablet Take 325 mg by mouth daily with breakfast.   MAGNESIUM PO Take 500 mg by mouth daily.   pantoprazole 40 MG tablet Commonly known as: PROTONIX Take 1 tablet (40 mg total) by mouth 2 (two) times daily before a meal.   polyvinyl alcohol  1.4 % ophthalmic solution Commonly known as: LIQUIFILM TEARS Place 1 drop into both eyes as needed for dry eyes.   sodium chloride 0.65 % Soln nasal spray Commonly known as: OCEAN Place 1 spray into both nostrils as needed for congestion.   sucralfate 1 g tablet Commonly known as: CARAFATE Take 1 tablet (1 g total) by mouth 4 (four)  times daily -  with meals and at bedtime.   VITAMIN C PO Take 1 tablet by mouth daily.       Follow-up Information    Unk Pinto, MD. Schedule an appointment as soon as possible for a visit in 2 day(s).   Specialty: Internal Medicine Contact information: [address] [place] [address] [phone]              Allergies  Allergen Reactions  . Evista [Raloxifene] Other (See Comments)    Blood clots - DVT and Pulmonary Embolism   . Fosamax [Alendronate Sodium]     esophagitis    Consultations:  Hematology  GI  Procedures/Studies: IR IVC FILTER PLMT / S&I /IMG GUID/MOD SED  Result Date: 11/20/2020 INDICATION: 80 year old woman with prior history of DVT and be treated with Eliquis needs to stop anticoagulation due to upper GI bleed. Interventional radiology consulted for IVC filter placement. EXAM: Retrievable IVC filter placement MEDICATIONS: None. ANESTHESIA/SEDATION: Two mg IV Versed; 20 mcg IV Fentanyl Moderate Sedation Time:  13 minutes The patient was continuously monitored during the procedure by the interventional radiology nurse under my direct supervision. FLUOROSCOPY TIME:  Fluoroscopy Time: 2 minutes 30 seconds (65 mGy). COMPLICATIONS: None immediate. PROCEDURE: Informed written consent was obtained from the patient after a thorough discussion of the procedural risks, benefits and alternatives. All questions were addressed. Maximal Sterile Barrier Technique was utilized including caps, mask, sterile gowns, sterile gloves, sterile drape, hand hygiene and skin antiseptic. A timeout was performed prior to the initiation of the procedure. Patient positioned supine on the angiography table. Right neck prepped and draped in usual sterile fashion. All elements of maximal sterile barrier were utilized including, cap, mask, sterile gown, sterile gloves, large sterile drape, hand scrubbing and 2% Chlorhexidine for skin cleaning. The right internal  jugular vein was evaluated with ultrasound and shown to be patent. A permanent ultrasound image was obtained and placed in the patient's medical record. Using sterile gel and a sterile probe cover, the right internal jugular vein was entered with a 21 ga needle during real time ultrasound guidance. 21 gauge needle exchanged for a transitional dilator set over 0.018 inch guidewire. 0.035 inch guidewire advanced to the IVC under fluoroscopic guidance. Transitional dilator set exchanged for 10 French sheath over 0.035 inch guidewire. 10 Fr sheath placed to the infrarenal IVC and venogram performed to delineate the position of the renal veins. Denali retrievable IVC filter deployed in the infrarenal location. Hemostasis achieved with manual compression. IMPRESSION: Retrievable Denali filter placed in infra renal IVC. PLAN: This IVC filter is potentially retrievable. The patient will be assessed for filter retrieval by Interventional Radiology in approximately 8-12 weeks. Further recommendations regarding filter retrieval, continued surveillance or declaration of device permanence, will be made at that time. Electronically Signed   By: Miachel Roux M.D.   On: 11/20/2020 09:52   VAS [place] LOWER EXTREMITY VENOUS (DVT) (ONLY [facility] & [facility] 7a-7p)  Result Date: 11/19/2020  Lower Venous DVT Study Patient Name:  AUNDRA BAISDEN  Date of Exam:   11/19/2020 Medical Rec #: [id number]  Accession #:    [id number] Date of Birth: 26-Jan-1941         Patient Gender: F Patient Age:   79Y Exam Location:  [facility] Procedure:      VAS [place] LOWER EXTREMITY VENOUS (DVT) Referring Phys: [id number] CLAUDIA J GIBBONS --------------------------------------------------------------------------------  Indications: Swelling.  Risk Factors: None identified. Limitations: Poor ultrasound/tissue interface. Comparison Study: No prior studies. Performing Technologist: Oliver Hum RVT  Examination Guidelines: A complete evaluation includes  B-mode imaging, spectral Doppler, color Doppler, and power Doppler as needed of all accessible portions of each vessel. Bilateral testing is considered an integral part of a complete examination. Limited examinations for reoccurring indications may be performed as noted. The reflux portion of the exam is performed with the patient in reverse Trendelenburg.  +---------+---------------+---------+-----------+----------+-------------------+ RIGHT    CompressibilityPhasicitySpontaneityPropertiesThrombus Aging      +---------+---------------+---------+-----------+----------+-------------------+ CFV      Full           Yes      Yes                                      +---------+---------------+---------+-----------+----------+-------------------+ SFJ      Full                                                             +---------+---------------+---------+-----------+----------+-------------------+ FV Prox  Full                                                             +---------+---------------+---------+-----------+----------+-------------------+ FV Mid   Full                                                             +---------+---------------+---------+-----------+----------+-------------------+ FV DistalFull                                                             +---------+---------------+---------+-----------+----------+-------------------+ PFV      Full                                                             +---------+---------------+---------+-----------+----------+-------------------+ POP      Full           Yes      Yes                                      +---------+---------------+---------+-----------+----------+-------------------+  PTV      Full                                                             +---------+---------------+---------+-----------+----------+-------------------+ PERO                                                   Not well visualized +---------+---------------+---------+-----------+----------+-------------------+   +---------+---------------+---------+-----------+----------+-------------------+ LEFT     CompressibilityPhasicitySpontaneityPropertiesThrombus Aging      +---------+---------------+---------+-----------+----------+-------------------+ CFV      Full           Yes      Yes                                      +---------+---------------+---------+-----------+----------+-------------------+ SFJ      Full                                                             +---------+---------------+---------+-----------+----------+-------------------+ FV Prox  None           No       No                   Acute               +---------+---------------+---------+-----------+----------+-------------------+ FV Mid   None           No       No                   Acute               +---------+---------------+---------+-----------+----------+-------------------+ FV DistalNone           No       No                   Acute               +---------+---------------+---------+-----------+----------+-------------------+ PFV      Full                                                             +---------+---------------+---------+-----------+----------+-------------------+ POP      None           No       No                   Acute               +---------+---------------+---------+-----------+----------+-------------------+ PTV      Full                                                             +---------+---------------+---------+-----------+----------+-------------------+  PERO                                                  Not well visualized +---------+---------------+---------+-----------+----------+-------------------+     Summary: RIGHT: - There is no evidence of deep vein thrombosis in the lower extremity.  - No cystic structure found in the  popliteal fossa.  LEFT: - Findings consistent with acute deep vein thrombosis involving the left femoral vein, left popliteal vein, and left gastrocnemius veins. - No cystic structure found in the popliteal fossa.  *See table(s) above for measurements and observations. Electronically signed by Jamelle Haring on 11/19/2020 at 5:23:55 PM.    Final      Subjective: Eager to go home  Discharge Exam: Vitals:   11/21/20 1419 11/21/20 2048  BP: 113/82 122/83  Pulse: 98 (!) 109  Resp: 16 18  Temp: 98.3 F (36.8 C) 98.7 F (37.1 C)  SpO2: 97% 95%   Vitals:   11/21/20 0435 11/21/20 0906 11/21/20 1419 11/21/20 2048  BP: 118/68 122/86 113/82 122/83  Pulse: 79 99 98 (!) 109  Resp: 16 18 16 18   Temp: 98.4 F (36.9 C) 98.4 F (36.9 C) 98.3 F (36.8 C) 98.7 F (37.1 C)  TempSrc: Oral   Oral  SpO2: 97% 95% 97% 95%  Weight:      Height:        General: Pt is alert, awake, not in acute distress Cardiovascular: RRR, S1/S2 +, no rubs, no gallops Respiratory: CTA bilaterally, no wheezing, no rhonchi Abdominal: Soft, NT, ND, bowel sounds + Extremities: no edema, no cyanosis   The results of significant diagnostics from this hospitalization (including imaging, microbiology, ancillary and laboratory) are listed below for reference.     Microbiology: Recent Results (from the past 240 hour(s))  Resp Panel by RT-PCR (Flu A&B, Covid) Nasopharyngeal Swab     Status: None   Collection Time: 11/19/20  3:02 PM   Specimen: Nasopharyngeal Swab; Nasopharyngeal(NP) swabs in vial transport medium  Result Value Ref Range Status   SARS Coronavirus 2 by RT PCR NEGATIVE NEGATIVE Final    Comment: (NOTE) SARS-CoV-2 target nucleic acids are NOT DETECTED.  The SARS-CoV-2 RNA is generally detectable in upper respiratory specimens during the acute phase of infection. The lowest concentration of SARS-CoV-2 viral copies this assay can detect is 138 copies/mL. A negative result does not preclude  SARS-Cov-2 infection and should not be used as the sole basis for treatment or other patient management decisions. A negative result may occur with  improper specimen collection/handling, submission of specimen other than nasopharyngeal swab, presence of viral mutation(s) within the areas targeted by this assay, and inadequate number of viral copies(<138 copies/mL). A negative result must be combined with clinical observations, patient history, and epidemiological information. The expected result is Negative.  Fact Sheet for Patients:  EntrepreneurPulse.com.au  Fact Sheet for Healthcare Providers:  IncredibleEmployment.be  This test is no t yet approved or cleared by the [place] FDA and  has been authorized for detection and/or diagnosis of SARS-CoV-2 by FDA under an Emergency Use Authorization (EUA). This EUA will remain  in effect (meaning this test can be used) for the duration of the COVID-19 declaration under Section 564(b)(1) of the Act, 21 U.S.C.section 360bbb-3(b)(1), unless the authorization is terminated  or revoked sooner.       Influenza A by  PCR NEGATIVE NEGATIVE Final   Influenza B by PCR NEGATIVE NEGATIVE Final    Comment: (NOTE) The Xpert Xpress SARS-CoV-2/FLU/RSV plus assay is intended as an aid in the diagnosis of influenza from Nasopharyngeal swab specimens and should not be used as a sole basis for treatment. Nasal washings and aspirates are unacceptable for Xpert Xpress SARS-CoV-2/FLU/RSV testing.  Fact Sheet for Patients: EntrepreneurPulse.com.au  Fact Sheet for Healthcare Providers: IncredibleEmployment.be  This test is not yet approved or cleared by the [place] FDA and has been authorized for detection and/or diagnosis of SARS-CoV-2 by FDA under an Emergency Use Authorization (EUA). This EUA will remain in effect (meaning this test can be used) for the duration of  the COVID-19 declaration under Section 564(b)(1) of the Act, 21 U.S.C. section 360bbb-3(b)(1), unless the authorization is terminated or revoked.  Performed at [facility], [place] [address], [place], [place] [address]      Labs: BNP (last 3 results) No results for input(s): BNP in the last 8760 hours. Basic Metabolic Panel: Recent Labs  Lab 11/19/20 1236 11/20/20 0920  NA 144 137  K 4.1 4.3  CL 109 105  CO2 25 24  GLUCOSE 97 87  BUN 26* 15  CREATININE 0.97 0.84  CALCIUM 9.9 9.1   Liver Function Tests: Recent Labs  Lab 11/19/20 1236  AST 17  ALT 12  ALKPHOS 62  BILITOT 1.1  PROT 7.7  ALBUMIN 4.0   No results for input(s): LIPASE, AMYLASE in the last 168 hours. No results for input(s): AMMONIA in the last 168 hours. CBC: Recent Labs  Lab 11/19/20 1132 11/20/20 0920 11/21/20 0754 11/22/20 0556  WBC 8.2 6.3 6.2 6.6  NEUTROABS 5.3  --   --   --   HGB 12.2 11.4* 10.8* 10.6*  HCT 39.8 36.4 35.4* 34.9*  MCV 102.8* 101.4* 102.9* 102.6*  PLT 252 231 202 203   Cardiac Enzymes: No results for input(s): CKTOTAL, CKMB, CKMBINDEX, TROPONINI in the last 168 hours. BNP: Invalid input(s): POCBNP CBG: No results for input(s): GLUCAP in the last 168 hours. D-Dimer No results for input(s): DDIMER in the last 72 hours. Hgb A1c No results for input(s): HGBA1C in the last 72 hours. Lipid Profile No results for input(s): CHOL, HDL, LDLCALC, TRIG, CHOLHDL, LDLDIRECT in the last 72 hours. Thyroid function studies No results for input(s): TSH, T4TOTAL, T3FREE, THYROIDAB in the last 72 hours.  Invalid input(s): FREET3 Anemia work up No results for input(s): VITAMINB12, FOLATE, FERRITIN, TIBC, IRON, RETICCTPCT in the last 72 hours. Urinalysis    Component Value Date/Time   COLORURINE YELLOW 03/03/2020 1003   APPEARANCEUR CLEAR 03/03/2020 1003   LABSPEC 1.010 03/03/2020 1003   PHURINE < OR = 5.0 03/03/2020 1003   GLUCOSEU NEGATIVE 03/03/2020 1003    HGBUR NEGATIVE 03/03/2020 1003   BILIRUBINUR NEGATIVE 08/24/2016 1205   KETONESUR NEGATIVE 03/03/2020 1003   PROTEINUR NEGATIVE 03/03/2020 1003   UROBILINOGEN 0.2 03/25/2014 1052   NITRITE NEGATIVE 03/03/2020 1003   LEUKOCYTESUR NEGATIVE 03/03/2020 1003   Sepsis Labs Invalid input(s): PROCALCITONIN,  WBC,  LACTICIDVEN Microbiology Recent Results (from the past 240 hour(s))  Resp Panel by RT-PCR (Flu A&B, Covid) Nasopharyngeal Swab     Status: None   Collection Time: 11/19/20  3:02 PM   Specimen: Nasopharyngeal Swab; Nasopharyngeal(NP) swabs in vial transport medium  Result Value Ref Range Status   SARS Coronavirus 2 by RT PCR NEGATIVE NEGATIVE Final    Comment: (NOTE) SARS-CoV-2 target nucleic acids are  NOT DETECTED.  The SARS-CoV-2 RNA is generally detectable in upper respiratory specimens during the acute phase of infection. The lowest concentration of SARS-CoV-2 viral copies this assay can detect is 138 copies/mL. A negative result does not preclude SARS-Cov-2 infection and should not be used as the sole basis for treatment or other patient management decisions. A negative result may occur with  improper specimen collection/handling, submission of specimen other than nasopharyngeal swab, presence of viral mutation(s) within the areas targeted by this assay, and inadequate number of viral copies(<138 copies/mL). A negative result must be combined with clinical observations, patient history, and epidemiological information. The expected result is Negative.  Fact Sheet for Patients:  EntrepreneurPulse.com.au  Fact Sheet for Healthcare Providers:  IncredibleEmployment.be  This test is no t yet approved or cleared by the [place] FDA and  has been authorized for detection and/or diagnosis of SARS-CoV-2 by FDA under an Emergency Use Authorization (EUA). This EUA will remain  in effect (meaning this test can be used) for the duration of  the COVID-19 declaration under Section 564(b)(1) of the Act, 21 U.S.C.section 360bbb-3(b)(1), unless the authorization is terminated  or revoked sooner.       Influenza A by PCR NEGATIVE NEGATIVE Final   Influenza B by PCR NEGATIVE NEGATIVE Final    Comment: (NOTE) The Xpert Xpress SARS-CoV-2/FLU/RSV plus assay is intended as an aid in the diagnosis of influenza from Nasopharyngeal swab specimens and should not be used as a sole basis for treatment. Nasal washings and aspirates are unacceptable for Xpert Xpress SARS-CoV-2/FLU/RSV testing.  Fact Sheet for Patients: EntrepreneurPulse.com.au  Fact Sheet for Healthcare Providers: IncredibleEmployment.be  This test is not yet approved or cleared by the [place] FDA and has been authorized for detection and/or diagnosis of SARS-CoV-2 by FDA under an Emergency Use Authorization (EUA). This EUA will remain in effect (meaning this test can be used) for the duration of the COVID-19 declaration under Section 564(b)(1) of the Act, 21 U.S.C. section 360bbb-3(b)(1), unless the authorization is terminated or revoked.  Performed at [facility], [place] [address], [place], [place] [address]    Time spent: 57min  SIGNED:   Marylu Lund, MD  [facility] 11/22/2020, 11:24 AM  If 7PM-7AM, please contact night-coverage

## 2020-11-23 ENCOUNTER — Other Ambulatory Visit: Payer: Self-pay | Admitting: Hematology and Oncology

## 2020-11-23 ENCOUNTER — Telehealth: Payer: Self-pay | Admitting: *Deleted

## 2020-11-23 ENCOUNTER — Telehealth: Payer: Self-pay

## 2020-11-23 DIAGNOSIS — K250 Acute gastric ulcer with hemorrhage: Secondary | ICD-10-CM

## 2020-11-23 DIAGNOSIS — I82402 Acute embolism and thrombosis of unspecified deep veins of left lower extremity: Secondary | ICD-10-CM

## 2020-11-23 NOTE — Telephone Encounter (Signed)
Called patient on 11/23/2020 , 10:22 AM in an attempt to reach the patient for a hospital follow up. Spoke with patient, who states she is feeling better.  Admit date: 11/19/20 Discharge: 11/22/20   She does not have any questions or concerns about medications from the hospital admission. The patient's medications were reviewed over the phone, they were counseled to bring in all current medications to the hospital follow up visit.   I advised the patient to call if any questions or concerns arise about the hospital admission or medications    Home health was not started in the hospital.  All questions were answered and a follow up appointment was made. Per Dr Melford Aase, no visit here is necessary, since patient has an appointment with Dr Alvy Bimler on 11/25/2020.  Prior to Admission medications   Medication Sig Start Date End Date Taking? Authorizing Provider  apixaban (ELIQUIS) 5 MG TABS tablet Take 1 tablet (5 mg total) by mouth 2 (two) times daily. 11/22/20 12/22/20  Donne Hazel, MD  Ascorbic Acid (VITAMIN C PO) Take 1 tablet by mouth daily.    [provider]  calcium carbonate (OS-CAL) 600 MG TABS tablet Take 600 mg by mouth daily with breakfast.     [provider]  Cholecalciferol (DIALYVITE VITAMIN D 5000) 125 MCG (5000 UT) capsule Take 5,000 Units by mouth daily.    [provider]  ferrous sulfate 325 (65 FE) MG tablet Take 325 mg by mouth daily with breakfast.    [provider]  MAGNESIUM PO Take 500 mg by mouth daily.    [provider]  pantoprazole (PROTONIX) 40 MG tablet Take 1 tablet (40 mg total) by mouth 2 (two) times daily before a meal. 11/22/20 12/22/20  Donne Hazel, MD  polyvinyl alcohol (LIQUIFILM TEARS) 1.4 % ophthalmic solution Place 1 drop into both eyes as needed for dry eyes.    [provider]  sodium chloride (OCEAN) 0.65 % SOLN nasal spray Place 1 spray into both nostrils as needed for congestion.    [provider]  sucralfate (CARAFATE) 1 g tablet Take 1 tablet (1 g total) by mouth 4 (four) times daily -  with meals and at bedtime. 11/22/20 12/22/20  Donne Hazel, MD

## 2020-11-23 NOTE — Telephone Encounter (Signed)
Prolia VOB initiated via [email or web address]  Last OV 10/28/20 Next OV Last Prolia inj 06/22/20 Next Prolia inj due 12/21/20

## 2020-11-25 ENCOUNTER — Other Ambulatory Visit: Payer: Self-pay

## 2020-11-25 ENCOUNTER — Encounter: Payer: Self-pay | Admitting: Hematology and Oncology

## 2020-11-25 ENCOUNTER — Inpatient Hospital Stay: Payer: Medicare PPO | Attending: Hematology and Oncology

## 2020-11-25 ENCOUNTER — Inpatient Hospital Stay: Payer: Medicare PPO | Admitting: Hematology and Oncology

## 2020-11-25 DIAGNOSIS — D471 Chronic myeloproliferative disease: Secondary | ICD-10-CM | POA: Insufficient documentation

## 2020-11-25 DIAGNOSIS — D500 Iron deficiency anemia secondary to blood loss (chronic): Secondary | ICD-10-CM | POA: Insufficient documentation

## 2020-11-25 DIAGNOSIS — I82402 Acute embolism and thrombosis of unspecified deep veins of left lower extremity: Secondary | ICD-10-CM

## 2020-11-25 DIAGNOSIS — K922 Gastrointestinal hemorrhage, unspecified: Secondary | ICD-10-CM | POA: Diagnosis not present

## 2020-11-25 DIAGNOSIS — I82412 Acute embolism and thrombosis of left femoral vein: Secondary | ICD-10-CM

## 2020-11-25 DIAGNOSIS — Z803 Family history of malignant neoplasm of breast: Secondary | ICD-10-CM | POA: Insufficient documentation

## 2020-11-25 DIAGNOSIS — Z86711 Personal history of pulmonary embolism: Secondary | ICD-10-CM | POA: Diagnosis not present

## 2020-11-25 DIAGNOSIS — M810 Age-related osteoporosis without current pathological fracture: Secondary | ICD-10-CM | POA: Diagnosis not present

## 2020-11-25 DIAGNOSIS — K250 Acute gastric ulcer with hemorrhage: Secondary | ICD-10-CM

## 2020-11-25 DIAGNOSIS — R600 Localized edema: Secondary | ICD-10-CM | POA: Diagnosis not present

## 2020-11-25 LAB — COMPREHENSIVE METABOLIC PANEL
ALT: 17 U/L (ref 0–44)
AST: 25 U/L (ref 15–41)
Albumin: 3.7 g/dL (ref 3.5–5.0)
Alkaline Phosphatase: 64 U/L (ref 38–126)
Anion gap: 11 (ref 5–15)
BUN: 16 mg/dL (ref 8–23)
CO2: 21 mmol/L — ABNORMAL LOW (ref 22–32)
Calcium: 9.3 mg/dL (ref 8.9–10.3)
Chloride: 110 mmol/L (ref 98–111)
Creatinine, Ser: 0.86 mg/dL (ref 0.44–1.00)
GFR, Estimated: >60 mL/min (ref >60)
Glucose, Bld: 92 mg/dL (ref 70–99)
Potassium: 3.6 mmol/L (ref 3.5–5.1)
Sodium: 142 mmol/L (ref 135–145)
Total Bilirubin: 0.5 mg/dL (ref 0.3–1.2)
Total Protein: 7 g/dL (ref 6.5–8.1)

## 2020-11-25 LAB — CBC WITH DIFFERENTIAL/PLATELET
Abs Immature Granulocytes: 0.06 10*3/uL (ref 0.00–0.07)
Basophils Absolute: 0.1 10*3/uL (ref 0.0–0.1)
Basophils Relative: 1 %
Eosinophils Absolute: 0.3 10*3/uL (ref 0.0–0.5)
Eosinophils Relative: 5 %
HCT: 35.5 % — ABNORMAL LOW (ref 36.0–46.0)
Hemoglobin: 11 g/dL — ABNORMAL LOW (ref 12.0–15.0)
Immature Granulocytes: 1 %
Lymphocytes Relative: 24 %
Lymphs Abs: 1.7 10*3/uL (ref 0.7–4.0)
MCH: 30.6 pg (ref 26.0–34.0)
MCHC: 31 g/dL (ref 30.0–36.0)
MCV: 98.9 fL (ref 80.0–100.0)
Monocytes Absolute: 0.8 10*3/uL (ref 0.1–1.0)
Monocytes Relative: 11 %
Neutro Abs: 4.2 10*3/uL (ref 1.7–7.7)
Neutrophils Relative %: 58 %
Platelets: 277 10*3/uL (ref 150–400)
RBC: 3.59 MIL/uL — ABNORMAL LOW (ref 3.87–5.11)
RDW: 14.1 % (ref 11.5–15.5)
WBC: 7.2 10*3/uL (ref 4.0–10.5)
nRBC: 0 % (ref 0.0–0.2)

## 2020-11-25 LAB — SAMPLE TO BLOOD BANK

## 2020-11-25 NOTE — Progress Notes (Signed)
[place] OFFICE PROGRESS NOTE  Unk Pinto, MD  ASSESSMENT & PLAN:  Myeloproliferative disorder [facility]) She is still mildly anemic and weak For now, I do not plan to resume hydroxyurea Her platelet count and hemoglobin are stable I will see her again in 2 weeks for further follow-up  Acute deep vein thrombosis (DVT) of femoral vein of left lower extremity ([place]) Due to her high risk situation, she has IVC filter in place along with anticoagulation therapy I do not recommend IVC filter removal in the short-term, for at least 3 months to ensure stability that she is not at risk of further bleeding while on anticoagulation therapy before consideration for IVC filter removal  Anemia due to gastrointestinal blood loss Her blood counts are stable She does not need transfusion support We will monitor carefully and she will return in 2 weeks for repeat blood work She will continue oral iron supplement   No orders of the defined types were placed in this encounter.   The total time spent in the appointment was 20 minutes encounter with patients including review of chart and various tests results, discussions about plan of care and coordination of care plan   All questions were answered. The patient knows to call the clinic with any problems, questions or concerns. No barriers to learning was detected.    [name], MD 4/28/20221:19 PM  INTERVAL HISTORY: Kimberly Watson 80 y.o. 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   SUMMARY OF HEMATOLOGIC HISTORY:  Kimberly Watson was referred here after recent diagnosis of severe left lower  extremity DVT with bilateral PE and cor pulmonale. I reviewed her records extensively and collaborated the history with the patient. The patient has been taking Evista for over 25 years due to strong family history of breast cancer and for osteoporosis management. In January 2017, she had viral gastroenteritis with severe diarrhea and dehydration. The patient has been taking Lasix chronically for mild bilateral lower extremity edema, left greater than the right. 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 She recalled having profuse diarrhea nonstop for 24 hours leading up to the blood clot event. Of note, prior to the diagnosis, she was not taking aspirin consistently  Prior to that she denies recent history of trauma, long distance travel, recent surgery, smoking or prolonged immobilization. She had no prior history or diagnosis of cancer. Her age appropriate screening programs are up-to-date. She had prior surgeries before and never had perioperative thromboembolic events. The patient had been exposed to birth control pills and never had thrombotic events. The patient had been pregnant before and denies history of peripartum thromboembolic event or history of recurrent miscarriages. There is no family history of blood clots or miscarriages.  On 08/18/2015, she presented to the emergency department due to symptoms of severe weakness, chest pressure, shortness of breath and near syncopal episode. She had extensive evaluation in the emergency room. D-dimer was elevated at greater than 20.  CT angiogram dated 08/19/2015 show massive saddle pulmonary embolus with evidence of severe right heart strain.  Left lower extremity venous Doppler on 08/19/2015 showed findings consistent with acute deep vein thrombosis  involving the left femoral vein, left popliteal vein, left posterior tibial vein and left peroneal vein.  Echocardiogram on 08/19/2015 show  preserved ejection fraction but with right ventricular dilatation, moderate tricuspid valvular regurgitation, oscillating density on TV(thought to be blood clot in transit) and moderate to severe increased right systolic pressure/evidence of severe pulmonary hypertension  The patient was admitted with IV heparin anticoagulation therapy and placement of IVC filter on 08/20/2015.  Echocardiogram was repeated on 08/23/2015 which showed persistent elevated pulmonary hypertension. The mass over the right tricuspid valve is no longer visible She was hospitalized on 08/18/2015 to 08/24/2015. She was discharged home on Eliquis.  Evista was discontinued. 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  Her anticoagulation was placed on hold recently and she developed acute DVT.  She had IVC filter placed on 11/19/2020 On 11/20/2020, she underwent repeat EGD due to GI bleed which showed - Z-line regular, 36 cm from the incisors. - No gross lesions in esophagus. - Non-bleeding gastric ulcer with a clean ulcer base ([place] Class III). - Normal examined duodenum. - No specimens collected.  I have reviewed the past medical history, past surgical history, social history and family history with the patient and they are unchanged from previous note.  ALLERGIES:  is allergic to evista [raloxifene] and fosamax [alendronate sodium].  MEDICATIONS:  Current Outpatient Medications  Medication Sig Dispense Refill  . apixaban (ELIQUIS) 5 MG TABS tablet Take 1 tablet (5 mg total) by mouth 2 (two) times daily. 60 tablet 0  . Ascorbic  Acid (VITAMIN C PO) Take 1 tablet by mouth daily.    . calcium carbonate (OS-CAL) 600 MG TABS tablet Take 600 mg by mouth daily with breakfast.     . Cholecalciferol (DIALYVITE VITAMIN D 5000) 125 MCG (5000 UT) capsule Take 5,000 Units by mouth daily.    . ferrous sulfate 325 (65 FE) MG tablet Take 325 mg by mouth daily with breakfast.    . MAGNESIUM PO Take 500 mg by mouth daily.    . pantoprazole (PROTONIX) 40 MG tablet Take 1 tablet (40 mg total) by mouth 2 (two) times daily before a meal. 60 tablet 0  . polyvinyl alcohol (LIQUIFILM TEARS) 1.4 % ophthalmic solution Place 1 drop into both eyes as needed for dry eyes.    . sodium chloride (OCEAN) 0.65 % SOLN nasal spray Place 1 spray into both nostrils as needed for congestion.    . sucralfate (CARAFATE) 1 g tablet Take 1 tablet (1 g total) by mouth 4 (four) times daily -  with meals and at bedtime. 120 tablet 0   No current facility-administered medications for this visit.     REVIEW OF SYSTEMS:   Constitutional: Denies fevers, chills or night sweats Eyes: Denies blurriness of vision Ears, nose, mouth, throat, and face: Denies mucositis or sore throat Respiratory: Denies cough, dyspnea or wheezes Cardiovascular: Denies palpitation, chest discomfort or lower extremity swelling Gastrointestinal:  Denies nausea, heartburn or change in bowel habits Skin: Denies abnormal skin rashes Lymphatics: Denies new lymphadenopathy or easy bruising Neurological:Denies numbness, tingling or new weaknesses Behavioral/Psych: Mood is stable, no new changes  All other systems were reviewed with the  patient and are negative.  PHYSICAL EXAMINATION: ECOG PERFORMANCE STATUS: 1 - Symptomatic but completely ambulatory  Vitals:   11/25/20 1249  BP: (!) 141/87  Pulse: (!) 101  Resp: 18  Temp: 98.3 F (36.8 C)  SpO2: 94%   Filed Weights   11/25/20 1249  Weight: 182 lb (82.6 kg)    GENERAL:alert, no distress and comfortable SKIN: skin color, texture,  turgor are normal, no rashes or significant lesions EYES: normal, Conjunctiva are pink and non-injected, sclera clear OROPHARYNX:no exudate, no erythema and lips, buccal mucosa, and tongue normal  NECK: supple, thyroid normal size, non-tender, without nodularity LYMPH:  no palpable lymphadenopathy in the cervical, axillary or inguinal LUNGS: clear to auscultation and percussion with normal breathing effort HEART: regular rate & rhythm and no murmurs with mild trace bilateral lower extremity edema ABDOMEN:abdomen soft, non-tender and normal bowel sounds Musculoskeletal:no cyanosis of digits and no clubbing  NEURO: alert & oriented x 3 with fluent speech, no focal motor/sensory deficits  LABORATORY DATA:  I have reviewed the data as listed     Component Value Date/Time   NA 142 11/25/2020 1224   K 3.6 11/25/2020 1224   CL 110 11/25/2020 1224   CO2 21 (L) 11/25/2020 1224   GLUCOSE 92 11/25/2020 1224   BUN 16 11/25/2020 1224   CREATININE 0.86 11/25/2020 1224   CREATININE 0.89 (H) 08/04/2020 1549   CALCIUM 9.3 11/25/2020 1224   PROT 7.0 11/25/2020 1224   ALBUMIN 3.7 11/25/2020 1224   AST 25 11/25/2020 1224   ALT 17 11/25/2020 1224   ALKPHOS 64 11/25/2020 1224   BILITOT 0.5 11/25/2020 1224   GFRNONAA >60 11/25/2020 1224   GFRNONAA 61 08/04/2020 1549   GFRAA 71 08/04/2020 1549    No results found for: SPEP, UPEP  Lab Results  Component Value Date   WBC 7.2 11/25/2020   NEUTROABS 4.2 11/25/2020   HGB 11.0 (L) 11/25/2020   HCT 35.5 (L) 11/25/2020   MCV 98.9 11/25/2020   PLT 277 11/25/2020      Chemistry      Component Value Date/Time   NA 142 11/25/2020 1224   K 3.6 11/25/2020 1224   CL 110 11/25/2020 1224   CO2 21 (L) 11/25/2020 1224   BUN 16 11/25/2020 1224   CREATININE 0.86 11/25/2020 1224   CREATININE 0.89 (H) 08/04/2020 1549      Component Value Date/Time   CALCIUM 9.3 11/25/2020 1224   ALKPHOS 64 11/25/2020 1224   AST 25 11/25/2020 1224   ALT 17 11/25/2020  1224   BILITOT 0.5 11/25/2020 1224

## 2020-11-25 NOTE — Assessment & Plan Note (Signed)
Due to her high risk situation, she has IVC filter in place along with anticoagulation therapy I do not recommend IVC filter removal in the short-term, for at least 3 months to ensure stability that she is not at risk of further bleeding while on anticoagulation therapy before consideration for IVC filter removal 

## 2020-11-25 NOTE — Assessment & Plan Note (Addendum)
Her blood counts are stable She does not need transfusion support We will monitor carefully and she will return in 2 weeks for repeat blood work She will continue oral iron supplement

## 2020-11-25 NOTE — Assessment & Plan Note (Addendum)
She is still mildly anemic and weak For now, I do not plan to resume hydroxyurea Her platelet count and hemoglobin are stable I will see her again in 2 weeks for further follow-up

## 2020-12-07 ENCOUNTER — Telehealth: Payer: Self-pay | Admitting: Oncology

## 2020-12-07 NOTE — Telephone Encounter (Signed)
Called Kimberly Watson and rescheduled her lab apt on 12/09/20 to 12:30 and with Dr. Alvy Bimler to 1:00.

## 2020-12-09 ENCOUNTER — Inpatient Hospital Stay: Payer: Medicare PPO | Attending: Hematology and Oncology

## 2020-12-09 ENCOUNTER — Encounter: Payer: Self-pay | Admitting: Hematology and Oncology

## 2020-12-09 ENCOUNTER — Other Ambulatory Visit: Payer: Self-pay

## 2020-12-09 ENCOUNTER — Inpatient Hospital Stay: Payer: Medicare PPO | Admitting: Hematology and Oncology

## 2020-12-09 ENCOUNTER — Inpatient Hospital Stay: Payer: Medicare PPO

## 2020-12-09 DIAGNOSIS — I82412 Acute embolism and thrombosis of left femoral vein: Secondary | ICD-10-CM | POA: Diagnosis not present

## 2020-12-09 DIAGNOSIS — I82402 Acute embolism and thrombosis of unspecified deep veins of left lower extremity: Secondary | ICD-10-CM

## 2020-12-09 DIAGNOSIS — D471 Chronic myeloproliferative disease: Secondary | ICD-10-CM

## 2020-12-09 DIAGNOSIS — Z86711 Personal history of pulmonary embolism: Secondary | ICD-10-CM | POA: Insufficient documentation

## 2020-12-09 DIAGNOSIS — D500 Iron deficiency anemia secondary to blood loss (chronic): Secondary | ICD-10-CM | POA: Insufficient documentation

## 2020-12-09 DIAGNOSIS — Z7901 Long term (current) use of anticoagulants: Secondary | ICD-10-CM | POA: Insufficient documentation

## 2020-12-09 DIAGNOSIS — K250 Acute gastric ulcer with hemorrhage: Secondary | ICD-10-CM

## 2020-12-09 DIAGNOSIS — Z79899 Other long term (current) drug therapy: Secondary | ICD-10-CM | POA: Insufficient documentation

## 2020-12-09 DIAGNOSIS — Z86718 Personal history of other venous thrombosis and embolism: Secondary | ICD-10-CM | POA: Insufficient documentation

## 2020-12-09 LAB — CBC WITH DIFFERENTIAL/PLATELET
Abs Immature Granulocytes: 0.04 10*3/uL (ref 0.00–0.07)
Basophils Absolute: 0.1 10*3/uL (ref 0.0–0.1)
Basophils Relative: 2 %
Eosinophils Absolute: 0.3 10*3/uL (ref 0.0–0.5)
Eosinophils Relative: 3 %
HCT: 42.2 % (ref 36.0–46.0)
Hemoglobin: 13.1 g/dL (ref 12.0–15.0)
Immature Granulocytes: 1 %
Lymphocytes Relative: 23 %
Lymphs Abs: 1.9 10*3/uL (ref 0.7–4.0)
MCH: 29.3 pg (ref 26.0–34.0)
MCHC: 31 g/dL (ref 30.0–36.0)
MCV: 94.4 fL (ref 80.0–100.0)
Monocytes Absolute: 0.8 10*3/uL (ref 0.1–1.0)
Monocytes Relative: 9 %
Neutro Abs: 5.3 10*3/uL (ref 1.7–7.7)
Neutrophils Relative %: 62 %
Platelets: 491 10*3/uL — ABNORMAL HIGH (ref 150–400)
RBC: 4.47 MIL/uL (ref 3.87–5.11)
RDW: 14.3 % (ref 11.5–15.5)
WBC: 8.5 10*3/uL (ref 4.0–10.5)
nRBC: 0 % (ref 0.0–0.2)

## 2020-12-09 LAB — COMPREHENSIVE METABOLIC PANEL
ALT: 14 U/L (ref 0–44)
AST: 20 U/L (ref 15–41)
Albumin: 4 g/dL (ref 3.5–5.0)
Alkaline Phosphatase: 70 U/L (ref 38–126)
Anion gap: 10 (ref 5–15)
BUN: 17 mg/dL (ref 8–23)
CO2: 24 mmol/L (ref 22–32)
Calcium: 9.9 mg/dL (ref 8.9–10.3)
Chloride: 106 mmol/L (ref 98–111)
Creatinine, Ser: 1.01 mg/dL — ABNORMAL HIGH (ref 0.44–1.00)
GFR, Estimated: 56 mL/min — ABNORMAL LOW (ref >60)
Glucose, Bld: 100 mg/dL — ABNORMAL HIGH (ref 70–99)
Potassium: 4.2 mmol/L (ref 3.5–5.1)
Sodium: 140 mmol/L (ref 135–145)
Total Bilirubin: 0.6 mg/dL (ref 0.3–1.2)
Total Protein: 7.6 g/dL (ref 6.5–8.1)

## 2020-12-09 LAB — SAMPLE TO BLOOD BANK

## 2020-12-09 NOTE — Assessment & Plan Note (Signed)
Her anemia has recovered I recommend discontinuation of iron sulfate Recommend resumption of hydroxyurea 500 mg daily She has lab appointment with primary care on June 1 I will check her blood count result; if her platelet count improves, I plan to see her in July

## 2020-12-09 NOTE — Progress Notes (Signed)
[facility] OFFICE PROGRESS NOTE  Patient Care Team: Unk Pinto, MD as PCP - General (Internal Medicine) Monna Fam, MD as Consulting Physician (Ophthalmology) Gatha Mayer, MD as Consulting Physician (Gastroenterology) Kathie Rhodes, MD (Inactive) as Consulting Physician (Urology) Meisinger, Sherren Mocha, MD as Consulting Physician (Obstetrics and Gynecology)  ASSESSMENT & PLAN:  Myeloproliferative disorder [facility]) Her anemia has recovered I recommend discontinuation of iron sulfate Recommend resumption of hydroxyurea 500 mg daily She has lab appointment with primary care on June 1 I will check her blood count result; if her platelet count improves, I plan to see her in July  Anemia due to gastrointestinal blood loss This is resolved I recommend discontinuation of oral iron sulfate because it discolors her stool It would also make her myeloproliferative disorder worse  Acute deep vein thrombosis (DVT) of femoral vein of left lower extremity ([place] [facility]) Due to her high risk situation, she has IVC filter in place along with anticoagulation therapy I do not recommend IVC filter removal in the short-term, for at least 3 months to ensure stability that she is not at risk of further bleeding while on anticoagulation therapy before consideration for IVC filter removal   No orders of the defined types were placed in this encounter.   All questions were answered. The patient knows to call the clinic with any problems, questions or concerns. The total time spent in the appointment was 20 minutes encounter with patients including review of chart and various tests results, discussions about plan of care and coordination of care plan   [name], MD 12/09/2020 2:47 PM  INTERVAL HISTORY: Please see below for problem oriented charting. She returns for further follow-up She is doing well No recent GI bleed She noted occasional dark stool but she felt it is due to her oral iron  supplement Her energy level is good She denies leg pain  SUMMARY OF ONCOLOGIC HISTORY:  Kimberly Watson was referred here after recent diagnosis of severe left lower extremity DVT with bilateral PE and cor pulmonale. I reviewed her records extensively and collaborated the history with the patient. The patient has been taking Evista for over 25 years due to strong family history of breast cancer and for osteoporosis management. In January 2017, she had viral gastroenteritis with severe diarrhea and dehydration. The patient has been taking Lasix chronically for mild bilateral lower extremity edema, left greater than the right. 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 She recalled having profuse diarrhea nonstop for 24 hours leading up to the blood clot event. Of note, prior to the diagnosis, she was not taking aspirin consistently  Prior to that she denies recent history of trauma, long distance travel, recent surgery, smoking or prolonged immobilization. She had no prior history or diagnosis of cancer. Her age appropriate screening programs are up-to-date. She had prior surgeries before and never had perioperative thromboembolic events. The patient had been exposed to birth control pills and never had thrombotic events. The patient had been pregnant before and denies history of peripartum thromboembolic event or history of recurrent miscarriages. There is no family history of blood clots or miscarriages.  On 08/18/2015, she presented to the emergency department due to symptoms of severe weakness, chest pressure, shortness of breath and near syncopal episode. She had extensive evaluation in the emergency room. D-dimer was elevated at greater than 20.  CT angiogram dated 08/19/2015 show massive saddle pulmonary embolus with evidence of severe right heart  strain.  Left lower extremity venous Doppler on 08/19/2015 showed findings consistent with  acute deep vein thrombosis involving the left femoral vein, left popliteal vein, left posterior tibial vein and left peroneal vein.  Echocardiogram on 08/19/2015 show preserved ejection fraction but with right ventricular dilatation, moderate tricuspid valvular regurgitation, oscillating density on TV(thought to be blood clot in transit) and moderate to severe increased right systolic pressure/evidence of severe pulmonary hypertension  The patient was admitted with IV heparin anticoagulation therapy and placement of IVC filter on 08/20/2015.  Echocardiogram was repeated on 08/23/2015 which showed persistent elevated pulmonary hypertension. The mass over the right tricuspid valve is no longer visible She was hospitalized on 08/18/2015 to 08/24/2015. She was discharged home on Eliquis.  Evista was discontinued. 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  Her anticoagulation was placed on hold recently and she developed acute DVT.  She had IVC filter placed on 11/19/2020 On 11/20/2020, she underwent repeat EGD due to GI bleed which showed - Z-line regular, 36 cm from the incisors. - No gross lesions in esophagus. - Non-bleeding gastric ulcer with a clean ulcer base ([place] Class III). - Normal examined duodenum. - No specimens collected. On Dec 09, 2020, she resume Hydrea  REVIEW OF SYSTEMS:   Constitutional: Denies fevers, chills or abnormal weight loss Eyes: Denies blurriness of vision Ears, nose, mouth, throat, and face: Denies mucositis or sore throat Respiratory: Denies cough, dyspnea or  wheezes Cardiovascular: Denies palpitation, chest discomfort or lower extremity swelling Gastrointestinal:  Denies nausea, heartburn or change in bowel habits Skin: Denies abnormal skin rashes Lymphatics: Denies new lymphadenopathy or easy bruising Neurological:Denies numbness, tingling or new weaknesses Behavioral/Psych: Mood is stable, no new changes  All other systems were reviewed with the patient and are negative.  I have reviewed the past medical history, past surgical history, social history and family history with the patient and they are unchanged from previous note.  ALLERGIES:  is allergic to evista [raloxifene] and fosamax [alendronate sodium].  MEDICATIONS:  Current Outpatient Medications  Medication Sig Dispense Refill  . hydroxyurea (HYDREA) 500 MG capsule Take 500 mg by mouth daily. May take with food to minimize GI side effects.    Marland Kitchen apixaban (ELIQUIS) 5 MG TABS tablet Take 1 tablet (5 mg total) by mouth 2 (two) times daily. 60 tablet 0  . Ascorbic Acid (VITAMIN C PO) Take 1 tablet by mouth daily.    . calcium carbonate (OS-CAL) 600 MG TABS tablet Take 600 mg by mouth daily with breakfast.     . Cholecalciferol (DIALYVITE VITAMIN D 5000) 125 MCG (5000 UT) capsule Take 5,000 Units by mouth daily.    Marland Kitchen MAGNESIUM PO Take 500 mg by mouth daily.    . pantoprazole (PROTONIX) 40 MG tablet Take 1 tablet (40 mg total) by mouth 2 (two) times daily before a meal. 60 tablet 0  . polyvinyl alcohol (LIQUIFILM TEARS) 1.4 % ophthalmic solution Place 1 drop into both eyes as needed for dry eyes.    . sodium chloride (OCEAN) 0.65 % SOLN nasal spray Place 1 spray into both nostrils as needed for congestion.    . sucralfate (CARAFATE) 1 g tablet  Take 1 tablet (1 g total) by mouth 4 (four) times daily -  with meals and at bedtime. 120 tablet 0   No current facility-administered medications for this visit.    PHYSICAL EXAMINATION: ECOG PERFORMANCE STATUS: 1 - Symptomatic but completely  ambulatory  Vitals:   12/09/20 1255  BP: (!) 147/92  Pulse: 94  Resp: 18  Temp: 98.1 F (36.7 C)  SpO2: 98%   Filed Weights   12/09/20 1255  Weight: 177 lb 12.8 oz (80.6 kg)    GENERAL:alert, no distress and comfortable SKIN: skin color, texture, turgor are normal, no rashes or significant lesions EYES: normal, Conjunctiva are pink and non-injected, sclera clear OROPHARYNX:no exudate, no erythema and lips, buccal mucosa, and tongue normal  NECK: supple, thyroid normal size, non-tender, without nodularity LYMPH:  no palpable lymphadenopathy in the cervical, axillary or inguinal LUNGS: clear to auscultation and percussion with normal breathing effort HEART: regular rate & rhythm and no murmurs and no lower extremity edema ABDOMEN:abdomen soft, non-tender and normal bowel sounds Musculoskeletal:no cyanosis of digits and no clubbing  NEURO: alert & oriented x 3 with fluent speech, no focal motor/sensory deficits  LABORATORY DATA:  I have reviewed the data as listed    Component Value Date/Time   NA 140 12/09/2020 1219   K 4.2 12/09/2020 1219   CL 106 12/09/2020 1219   CO2 24 12/09/2020 1219   GLUCOSE 100 (H) 12/09/2020 1219   BUN 17 12/09/2020 1219   CREATININE 1.01 (H) 12/09/2020 1219   CREATININE 0.89 (H) 08/04/2020 1549   CALCIUM 9.9 12/09/2020 1219   PROT 7.6 12/09/2020 1219   ALBUMIN 4.0 12/09/2020 1219   AST 20 12/09/2020 1219   ALT 14 12/09/2020 1219   ALKPHOS 70 12/09/2020 1219   BILITOT 0.6 12/09/2020 1219   GFRNONAA 56 (L) 12/09/2020 1219   GFRNONAA 61 08/04/2020 1549   GFRAA 71 08/04/2020 1549    No results found for: SPEP, UPEP  Lab Results  Component Value Date   WBC 8.5 12/09/2020   NEUTROABS 5.3 12/09/2020   HGB 13.1 12/09/2020   HCT 42.2 12/09/2020   MCV 94.4 12/09/2020   PLT 491 (H) 12/09/2020      Chemistry      Component Value Date/Time   NA 140 12/09/2020 1219   K 4.2 12/09/2020 1219   CL 106 12/09/2020 1219   CO2 24 12/09/2020  1219   BUN 17 12/09/2020 1219   CREATININE 1.01 (H) 12/09/2020 1219   CREATININE 0.89 (H) 08/04/2020 1549      Component Value Date/Time   CALCIUM 9.9 12/09/2020 1219   ALKPHOS 70 12/09/2020 1219   AST 20 12/09/2020 1219   ALT 14 12/09/2020 1219   BILITOT 0.6 12/09/2020 1219       RADIOGRAPHIC STUDIES: I have personally reviewed the radiological images as listed and agreed with the findings in the report. IR IVC FILTER PLMT / S&I Burke Keels GUID/MOD SED  Result Date: 11/20/2020 INDICATION: 80 year old woman with prior history of DVT and be treated with Eliquis needs to stop anticoagulation due to upper GI bleed. Interventional radiology consulted for IVC filter placement. EXAM: Retrievable IVC filter placement MEDICATIONS: None. ANESTHESIA/SEDATION: Two mg IV Versed; 20 mcg IV Fentanyl Moderate Sedation Time:  13 minutes The patient was continuously monitored during the procedure by the interventional radiology nurse under my direct supervision. FLUOROSCOPY TIME:  Fluoroscopy Time: 2 minutes 30 seconds (65 mGy). COMPLICATIONS: None immediate. PROCEDURE: Informed written consent was obtained from the patient  after a thorough discussion of the procedural risks, benefits and alternatives. All questions were addressed. Maximal Sterile Barrier Technique was utilized including caps, mask, sterile gowns, sterile gloves, sterile drape, hand hygiene and skin antiseptic. A timeout was performed prior to the initiation of the procedure. Patient positioned supine on the angiography table. Right neck prepped and draped in usual sterile fashion. All elements of maximal sterile barrier were utilized including, cap, mask, sterile gown, sterile gloves, large sterile drape, hand scrubbing and 2% Chlorhexidine for skin cleaning. The right internal jugular vein was evaluated with ultrasound and shown to be patent. A permanent ultrasound image was obtained and placed in the patient's medical record. Using sterile gel and a  sterile probe cover, the right internal jugular vein was entered with a 21 ga needle during real time ultrasound guidance. 21 gauge needle exchanged for a transitional dilator set over 0.018 inch guidewire. 0.035 inch guidewire advanced to the IVC under fluoroscopic guidance. Transitional dilator set exchanged for 10 French sheath over 0.035 inch guidewire. 10 Fr sheath placed to the infrarenal IVC and venogram performed to delineate the position of the renal veins. Denali retrievable IVC filter deployed in the infrarenal location. Hemostasis achieved with manual compression. IMPRESSION: Retrievable Denali filter placed in infra renal IVC. PLAN: This IVC filter is potentially retrievable. The patient will be assessed for filter retrieval by Interventional Radiology in approximately 8-12 weeks. Further recommendations regarding filter retrieval, continued surveillance or declaration of device permanence, will be made at that time. Electronically Signed   By: Miachel Roux M.D.   On: 11/20/2020 09:52   VAS [place] LOWER EXTREMITY VENOUS (DVT) (ONLY [facility] & [facility] 7a-7p)  Result Date: 11/19/2020  Lower Venous DVT Study Patient Name:  Kimberly Watson  Date of Exam:   11/19/2020 Medical Rec #: [id number]         Accession #:    [id number] Date of Birth: [birth year]         Patient Gender: F Patient Age:   79Y Exam Location:  [facility] Procedure:      VAS [place] LOWER EXTREMITY VENOUS (DVT) Referring Phys: [id number] CLAUDIA J GIBBONS --------------------------------------------------------------------------------  Indications: Swelling.  Risk Factors: None identified. Limitations: Poor ultrasound/tissue interface. Comparison Study: No prior studies. Performing Technologist: Oliver Hum RVT  Examination Guidelines: A complete evaluation includes B-mode imaging, spectral Doppler, color Doppler, and power Doppler as needed of all accessible portions of each vessel. Bilateral testing is considered an integral part of a complete  examination. Limited examinations for reoccurring indications may be performed as noted. The reflux portion of the exam is performed with the patient in reverse Trendelenburg.  +---------+---------------+---------+-----------+----------+-------------------+ RIGHT    CompressibilityPhasicitySpontaneityPropertiesThrombus Aging      +---------+---------------+---------+-----------+----------+-------------------+ CFV      Full           Yes      Yes                                      +---------+---------------+---------+-----------+----------+-------------------+ SFJ      Full                                                             +---------+---------------+---------+-----------+----------+-------------------+ FV Prox  Full                                                             +---------+---------------+---------+-----------+----------+-------------------+ FV Mid   Full                                                             +---------+---------------+---------+-----------+----------+-------------------+ FV DistalFull                                                             +---------+---------------+---------+-----------+----------+-------------------+ PFV      Full                                                             +---------+---------------+---------+-----------+----------+-------------------+ POP      Full           Yes      Yes                                      +---------+---------------+---------+-----------+----------+-------------------+ PTV      Full                                                             +---------+---------------+---------+-----------+----------+-------------------+ PERO                                                  Not well visualized +---------+---------------+---------+-----------+----------+-------------------+    +---------+---------------+---------+-----------+----------+-------------------+ LEFT     CompressibilityPhasicitySpontaneityPropertiesThrombus Aging      +---------+---------------+---------+-----------+----------+-------------------+ CFV      Full           Yes      Yes                                      +---------+---------------+---------+-----------+----------+-------------------+ SFJ      Full                                                             +---------+---------------+---------+-----------+----------+-------------------+ FV Prox  None  No       No                   Acute               +---------+---------------+---------+-----------+----------+-------------------+ FV Mid   None           No       No                   Acute               +---------+---------------+---------+-----------+----------+-------------------+ FV DistalNone           No       No                   Acute               +---------+---------------+---------+-----------+----------+-------------------+ PFV      Full                                                             +---------+---------------+---------+-----------+----------+-------------------+ POP      None           No       No                   Acute               +---------+---------------+---------+-----------+----------+-------------------+ PTV      Full                                                             +---------+---------------+---------+-----------+----------+-------------------+ PERO                                                  Not well visualized +---------+---------------+---------+-----------+----------+-------------------+     Summary: RIGHT: - There is no evidence of deep vein thrombosis in the lower extremity.  - No cystic structure found in the popliteal fossa.  LEFT: - Findings consistent with acute deep vein thrombosis involving the left femoral vein, left  popliteal vein, and left gastrocnemius veins. - No cystic structure found in the popliteal fossa.  *See table(s) above for measurements and observations. Electronically signed by Jamelle Haring on 11/19/2020 at 5:23:55 PM.    Final

## 2020-12-09 NOTE — Assessment & Plan Note (Signed)
This is resolved I recommend discontinuation of oral iron sulfate because it discolors her stool It would also make her myeloproliferative disorder worse

## 2020-12-09 NOTE — Assessment & Plan Note (Signed)
Due to her high risk situation, she has IVC filter in place along with anticoagulation therapy I do not recommend IVC filter removal in the short-term, for at least 3 months to ensure stability that she is not at risk of further bleeding while on anticoagulation therapy before consideration for IVC filter removal

## 2020-12-13 ENCOUNTER — Other Ambulatory Visit: Payer: Self-pay | Admitting: Internal Medicine

## 2020-12-13 DIAGNOSIS — D500 Iron deficiency anemia secondary to blood loss (chronic): Secondary | ICD-10-CM

## 2020-12-14 ENCOUNTER — Other Ambulatory Visit: Payer: Self-pay

## 2020-12-15 ENCOUNTER — Other Ambulatory Visit: Payer: Self-pay

## 2020-12-15 ENCOUNTER — Telehealth: Payer: Self-pay

## 2020-12-15 DIAGNOSIS — I10 Essential (primary) hypertension: Secondary | ICD-10-CM

## 2020-12-15 MED ORDER — METOPROLOL SUCCINATE ER 100 MG PO TB24: ORAL_TABLET | ORAL | 3 refills | Status: DC

## 2020-12-15 NOTE — Telephone Encounter (Signed)
Patient inquiring about whether or not she should continue taking the Metoprolol, 100mg . Was taken off of it while in the hospital. BP reading at home without meds was 131/90. Dr. Melford Aase aware and advised to start taking 50mg . Call with any questions or concerns.

## 2020-12-19 NOTE — Telephone Encounter (Signed)
MEDICAL BENEFIT SUMMARY Patient Out-of-Pocket Responsibility Coverage Available Authorization Required Deductible Co-pay/Coinsurance Prolia OOP COST PHYSICIAN FACILITY FEE ADMIN FEE PURCHASE OR REFERRAL: YES YES PA on File PRIMARY No SECONDARY No 0%* No* 0%* SPECIALTY PHARMACY (via Medical Benefit): YES YES PA on File 0%* No* 0%* *Reflects patient costs once plan deductible is met. Please see Medical Benefit Details for further information regarding patient costs. Patient costs may vary based on services rendered. BENEFITS VERIFIED FOR THE FOLLOWING DIAGNOSIS AND INSURANCE PLANS Verified for Diagnosis M81.0 Site of Care  MD Office   Policy Level: Primary Policy Status: Active Payer Name: [facility] Plan Name: Josephine Igo [facility] PPO NON GAte Policy Number: [id number] Employer Name: [employer] PLAN Plan Type: PPO Group Number: [id number]  Payer Phone: [phone] PRIMARY MEDICAL BENEFIT DETAILS (PHYSICIAN PURCHASE, OR REFERRAL TO TREATING SITE) COVERAGE AVAILABLE: Yes COVERAGE DETAILS: For the primary MD Purchase option, Prolia and administration will be covered at 100%. No deductible, coinsurance or out of pocket max applies. No referral required. We have provided in network benefits only.  AUTHORIZATION REQUIRED: Yes  PA PROCESS DETAILS: Prior authorization is on file (Authorization [id number]) and is valid from 4/26/2021through 07/30/2021. (Number of injections covered if other than 1(unlimited). The prior authorization department can be contacted at [phone].

## 2020-12-19 NOTE — Telephone Encounter (Signed)
Pt ready for scheduling on or after 12/21/20  Out-of-pocket cost due at time of visit: $0.00  Primary: [employer] Prolia co-insurance: does not apply Admin fee co-insurance: does not apply  Secondary: n/a Prolia co-insurance:  Admin fee co-insurance:   Deductible: does not apply  Prior Auth: APPROVED [id number] Valid: 11/24/19-07/30/21

## 2020-12-23 ENCOUNTER — Other Ambulatory Visit: Payer: Self-pay | Admitting: Hematology and Oncology

## 2020-12-23 ENCOUNTER — Other Ambulatory Visit: Payer: Self-pay | Admitting: Adult Health

## 2020-12-29 ENCOUNTER — Other Ambulatory Visit (INDEPENDENT_AMBULATORY_CARE_PROVIDER_SITE_OTHER): Payer: Medicare PPO

## 2020-12-29 ENCOUNTER — Other Ambulatory Visit: Payer: Self-pay

## 2020-12-29 DIAGNOSIS — D500 Iron deficiency anemia secondary to blood loss (chronic): Secondary | ICD-10-CM

## 2020-12-29 DIAGNOSIS — E673 Hypervitaminosis D: Secondary | ICD-10-CM | POA: Diagnosis not present

## 2020-12-29 DIAGNOSIS — E559 Vitamin D deficiency, unspecified: Secondary | ICD-10-CM | POA: Diagnosis not present

## 2020-12-29 LAB — CBC WITH DIFFERENTIAL/PLATELET
Basophils Absolute: 0.2 10*3/uL — ABNORMAL HIGH (ref 0.0–0.1)
Basophils Relative: 2.3 % (ref 0.0–3.0)
Eosinophils Absolute: 0.3 10*3/uL (ref 0.0–0.7)
Eosinophils Relative: 4.8 % (ref 0.0–5.0)
HCT: 42 % (ref 36.0–46.0)
Hemoglobin: 13.1 g/dL (ref 12.0–15.0)
Lymphocytes Relative: 28.1 % (ref 12.0–46.0)
Lymphs Abs: 1.9 10*3/uL (ref 0.7–4.0)
MCHC: 31.2 g/dL (ref 30.0–36.0)
MCV: 87.7 fl (ref 78.0–100.0)
Monocytes Absolute: 0.6 10*3/uL (ref 0.1–1.0)
Monocytes Relative: 9.3 % (ref 3.0–12.0)
Neutro Abs: 3.8 10*3/uL (ref 1.4–7.7)
Neutrophils Relative %: 55.5 % (ref 43.0–77.0)
Platelets: 454 10*3/uL — ABNORMAL HIGH (ref 150.0–400.0)
RBC: 4.79 Mil/uL (ref 3.87–5.11)
RDW: 17.5 % — ABNORMAL HIGH (ref 11.5–15.5)
WBC: 6.8 10*3/uL (ref 4.0–10.5)

## 2020-12-30 ENCOUNTER — Telehealth: Payer: Self-pay

## 2020-12-30 ENCOUNTER — Other Ambulatory Visit: Payer: Medicare PPO

## 2020-12-30 ENCOUNTER — Telehealth: Payer: Self-pay | Admitting: Hematology and Oncology

## 2020-12-30 ENCOUNTER — Ambulatory Visit: Payer: Medicare PPO | Admitting: Hematology and Oncology

## 2020-12-30 LAB — VITAMIN D 25 HYDROXY (VIT D DEFICIENCY, FRACTURES): Vit D, 25-Hydroxy: 95.6 ng/mL (ref 30.0–100.0)

## 2020-12-30 NOTE — Telephone Encounter (Signed)
-----   Message from [name], MD sent at 12/30/2020  8:01 AM EDT ----- I saw her labs from yesterday Looks good Continue hydrea I will send LOS for scheduler for appt end of July

## 2020-12-30 NOTE — Telephone Encounter (Signed)
Returning call.

## 2020-12-30 NOTE — Telephone Encounter (Signed)
Called and left below message. Ask her to call the office for questions. ?

## 2020-12-30 NOTE — Telephone Encounter (Addendum)
Called and left a message for pt to call back ----- Message from Philemon Kingdom, MD sent at 12/30/2020  8:39 AM EDT ----- Can you please call pt: Vitamin D level is now within the normal range.  However, I would recommend to take the 5000 units vitamin D only 6 out of 7 days, since the level is still close to the upper limit of normal.  Her CBC is also back and these have been already reviewed by Dr. Alvy Bimler for review of the chart.

## 2020-12-30 NOTE — Telephone Encounter (Signed)
Scheduled appts per 6/2 sch msg. Called pt, no answer. Left msg with appts date and times.  

## 2021-01-02 NOTE — Telephone Encounter (Signed)
Pt overdue for Prolia inj as of 12/21/20.  Please contact pt to schedule ASAP (see below for coverage information).

## 2021-01-03 NOTE — Telephone Encounter (Signed)
appt set for 01/11/21

## 2021-01-05 NOTE — Telephone Encounter (Signed)
Pt was notified.  

## 2021-01-11 ENCOUNTER — Other Ambulatory Visit: Payer: Self-pay

## 2021-01-11 ENCOUNTER — Ambulatory Visit (INDEPENDENT_AMBULATORY_CARE_PROVIDER_SITE_OTHER): Payer: Medicare PPO

## 2021-01-11 DIAGNOSIS — M810 Age-related osteoporosis without current pathological fracture: Secondary | ICD-10-CM

## 2021-01-11 MED ORDER — DENOSUMAB 60 MG/ML ~~LOC~~ SOSY
60.0000 mg | PREFILLED_SYRINGE | Freq: Once | SUBCUTANEOUS | Status: AC
  Administered 2021-01-11: 60 mg via SUBCUTANEOUS

## 2021-01-11 NOTE — Progress Notes (Signed)
Prolia injection administered to pt's left arm. Pt tolerated well. °

## 2021-01-12 NOTE — Telephone Encounter (Signed)
Pt received Prolia inj 01/11/21 Next inj due 07/14/21

## 2021-01-26 ENCOUNTER — Telehealth: Payer: Self-pay | Admitting: Hematology and Oncology

## 2021-01-26 NOTE — Telephone Encounter (Signed)
R/s appts per 6/29 sch msg. Called pt, no answer. Left msg with appts date and time.

## 2021-01-27 ENCOUNTER — Other Ambulatory Visit: Payer: Self-pay | Admitting: Adult Health

## 2021-01-27 NOTE — Telephone Encounter (Signed)
Med was d/c'd at recent discharge.

## 2021-02-21 ENCOUNTER — Other Ambulatory Visit: Payer: Medicare PPO

## 2021-02-21 ENCOUNTER — Ambulatory Visit: Payer: Medicare PPO | Admitting: Hematology and Oncology

## 2021-02-28 ENCOUNTER — Inpatient Hospital Stay: Payer: Medicare PPO | Admitting: Hematology and Oncology

## 2021-02-28 ENCOUNTER — Encounter: Payer: Self-pay | Admitting: Hematology and Oncology

## 2021-02-28 ENCOUNTER — Other Ambulatory Visit: Payer: Self-pay

## 2021-02-28 ENCOUNTER — Inpatient Hospital Stay: Payer: Medicare PPO | Attending: Hematology and Oncology

## 2021-02-28 DIAGNOSIS — I10 Essential (primary) hypertension: Secondary | ICD-10-CM | POA: Diagnosis not present

## 2021-02-28 DIAGNOSIS — Z86718 Personal history of other venous thrombosis and embolism: Secondary | ICD-10-CM | POA: Diagnosis not present

## 2021-02-28 DIAGNOSIS — E860 Dehydration: Secondary | ICD-10-CM | POA: Insufficient documentation

## 2021-02-28 DIAGNOSIS — D471 Chronic myeloproliferative disease: Secondary | ICD-10-CM | POA: Diagnosis not present

## 2021-02-28 DIAGNOSIS — K250 Acute gastric ulcer with hemorrhage: Secondary | ICD-10-CM

## 2021-02-28 DIAGNOSIS — Z79899 Other long term (current) drug therapy: Secondary | ICD-10-CM | POA: Diagnosis not present

## 2021-02-28 DIAGNOSIS — C946 Myelodysplastic disease, not classified: Secondary | ICD-10-CM | POA: Insufficient documentation

## 2021-02-28 DIAGNOSIS — I82412 Acute embolism and thrombosis of left femoral vein: Secondary | ICD-10-CM | POA: Diagnosis not present

## 2021-02-28 DIAGNOSIS — Z7901 Long term (current) use of anticoagulants: Secondary | ICD-10-CM | POA: Insufficient documentation

## 2021-02-28 DIAGNOSIS — I82402 Acute embolism and thrombosis of unspecified deep veins of left lower extremity: Secondary | ICD-10-CM

## 2021-02-28 LAB — CBC WITH DIFFERENTIAL/PLATELET
Abs Immature Granulocytes: 0.02 10*3/uL (ref 0.00–0.07)
Basophils Absolute: 0.2 10*3/uL — ABNORMAL HIGH (ref 0.0–0.1)
Basophils Relative: 2 %
Eosinophils Absolute: 0.2 10*3/uL (ref 0.0–0.5)
Eosinophils Relative: 2 %
HCT: 47 % — ABNORMAL HIGH (ref 36.0–46.0)
Hemoglobin: 14.3 g/dL (ref 12.0–15.0)
Immature Granulocytes: 0 %
Lymphocytes Relative: 25 %
Lymphs Abs: 2 10*3/uL (ref 0.7–4.0)
MCH: 25.5 pg — ABNORMAL LOW (ref 26.0–34.0)
MCHC: 30.4 g/dL (ref 30.0–36.0)
MCV: 83.8 fL (ref 80.0–100.0)
Monocytes Absolute: 0.8 10*3/uL (ref 0.1–1.0)
Monocytes Relative: 9 %
Neutro Abs: 4.9 10*3/uL (ref 1.7–7.7)
Neutrophils Relative %: 62 %
Platelets: 430 10*3/uL — ABNORMAL HIGH (ref 150–400)
RBC: 5.61 MIL/uL — ABNORMAL HIGH (ref 3.87–5.11)
RDW: 18.3 % — ABNORMAL HIGH (ref 11.5–15.5)
WBC: 8 10*3/uL (ref 4.0–10.5)
nRBC: 0 % (ref 0.0–0.2)

## 2021-02-28 MED ORDER — HYDROXYUREA 500 MG PO CAPS: ORAL_CAPSULE | ORAL | 3 refills | Status: DC

## 2021-02-28 NOTE — Progress Notes (Signed)
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[facility]) Even though her platelet count is better, she has slight worsening erythrocytosis I suspect there is a component of dehydration I recommend increasing hydroxyurea to 1000 mg on Sundays but keep the rest of the week at 500 mg I plan to see her again in the month for further follow-up  Essential hypertension She has worsening blood pressure control likely secondary to secondary erythrocytosis I recommend her to monitor her blood pressure carefully at home and to follow-up with primary care doctor for medication adjustment  Acute deep vein thrombosis (DVT) of femoral vein of left lower extremity ([place]) She is at risk of recurrent clots due to poor oral fluid intake and slight dehydration She will continue anticoagulation therapy indefinitely but I recommend her to increase oral fluid intake as much as possible  No orders of the defined types were placed in this encounter.   All questions were answered. The patient knows to call the clinic with any problems, questions or concerns. The total time spent in the appointment was 20 minutes encounter with patients including review of chart and various tests results, discussions about plan of care and coordination of care plan   [name], MD 02/28/2021 1:17 PM  INTERVAL HISTORY: Please see below for problem oriented charting. She returns for further follow-up She denies recent chest pain or shortness of breath Has occasional rare dizziness She admits she is not drinking enough water On average, she would  drink approximately 30 ounces of water The patient denies any recent signs or symptoms of bleeding such as spontaneous epistaxis, hematuria or hematochezia.  SUMMARY OF ONCOLOGIC HISTORY:  Kimberly Watson was referred here after recent diagnosis of severe left lower extremity DVT with bilateral PE and cor pulmonale. I reviewed her records extensively and collaborated the history with the patient. The patient has been taking Evista for over 25 years due to strong family history of breast cancer and for osteoporosis management. In January 2017, she had viral gastroenteritis with severe diarrhea and dehydration. The patient has been taking Lasix chronically for mild bilateral lower extremity edema, left greater than the right. 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 She recalled having profuse diarrhea nonstop for 24 hours leading up to the blood clot event. Of note, prior to the diagnosis, she was not taking aspirin consistently  Prior to that she denies recent history of trauma, long distance travel, recent surgery, smoking or prolonged immobilization. She had no prior history or diagnosis of cancer. Her age appropriate screening programs are up-to-date. She had prior surgeries before and never had perioperative thromboembolic events. The patient had been exposed to birth control pills and never had thrombotic events. The patient had been pregnant before and denies history of peripartum thromboembolic event or history of recurrent miscarriages. There is no family history of blood clots or miscarriages.  On 08/18/2015, she presented to the emergency department due to symptoms of severe weakness, chest pressure, shortness of breath and near syncopal episode. She had extensive evaluation in the emergency room. D-dimer was elevated at greater than 20.  CT angiogram dated 08/19/2015 show massive saddle pulmonary embolus  with evidence of severe  right heart strain.  Left lower extremity venous Doppler on 08/19/2015 showed findings consistent with acute deep vein thrombosis involving the left femoral vein, left popliteal vein, left posterior tibial vein and left peroneal vein.  Echocardiogram on 08/19/2015 show preserved ejection fraction but with right ventricular dilatation, moderate tricuspid valvular regurgitation, oscillating density on TV(thought to be blood clot in transit) and moderate to severe increased right systolic pressure/evidence of severe pulmonary hypertension  The patient was admitted with IV heparin anticoagulation therapy and placement of IVC filter on 08/20/2015.  Echocardiogram was repeated on 08/23/2015 which showed persistent elevated pulmonary hypertension. The mass over the right tricuspid valve is no longer visible She was hospitalized on 08/18/2015 to 08/24/2015. She was discharged home on Eliquis.  Evista was discontinued. 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  Her anticoagulation was placed on hold recently and she developed acute DVT.  She had IVC filter placed on 11/19/2020 On 11/20/2020, she underwent repeat EGD due to GI bleed which showed - Z-line regular, 36 cm from the incisors. - No gross lesions in esophagus. - Non-bleeding gastric ulcer with a clean ulcer base ([place] Class III). - Normal examined duodenum. - No specimens collected. On Dec 09, 2020, she resume Hydrea On February 28, 2021, the dose of hydroxyurea is changed to 500 mg daily except on Sundays she will take 1000 mg by mouth  REVIEW OF  SYSTEMS:   Constitutional: Denies fevers, chills or abnormal weight loss Eyes: Denies blurriness of vision Ears, nose, mouth, throat, and face: Denies mucositis or sore throat Respiratory: Denies cough, dyspnea or wheezes Cardiovascular: Denies palpitation, chest discomfort or lower extremity swelling Gastrointestinal:  Denies nausea, heartburn or change in bowel habits Skin: Denies abnormal skin rashes Lymphatics: Denies new lymphadenopathy or easy bruising Neurological:Denies numbness, tingling or new weaknesses Behavioral/Psych: Mood is stable, no new changes  All other systems were reviewed with the patient and are negative.  I have reviewed the past medical history, past surgical history, social history and family history with the patient and they are unchanged from previous note.  ALLERGIES:  is allergic to evista [raloxifene] and fosamax [alendronate sodium].  MEDICATIONS:  Current Outpatient Medications  Medication Sig Dispense Refill   Magnesium 100 MG TABS Take 100 mg by mouth daily.     vitamin C (ASCORBIC ACID) 500 MG tablet Take 500 mg by mouth daily. With meal     apixaban (ELIQUIS) 5 MG TABS tablet Take 1 tablet (5 mg total) by mouth 2 (two) times daily. 60 tablet 0   Ascorbic Acid (VITAMIN C PO) Take 1 tablet by mouth daily.     calcium carbonate (OS-CAL) 600 MG TABS tablet Take 600 mg by mouth daily with breakfast.      Cholecalciferol (DIALYVITE VITAMIN D 5000) 125 MCG (5000 UT) capsule Take 5,000 Units by mouth daily. Take Monday thru Saturday only.     hydroxyurea (HYDREA) 500 MG capsule Take 1 capsule daily except on Sundays take 2 capsules 90 capsule 3   MAGNESIUM PO Take 500 mg by mouth daily.     metoprolol succinate (TOPROL-XL) 100 MG 24 hr tablet TAKE 1/2 TABLET  DAILY FOR BLOOD PRESSURE 90 tablet 3   polyvinyl alcohol (LIQUIFILM TEARS) 1.4 % ophthalmic solution Place 1 drop into both eyes as needed for dry eyes.     sodium chloride (OCEAN) 0.65 % SOLN nasal  spray Place 1 spray into both nostrils as needed for congestion.     sucralfate (CARAFATE) 1 g tablet Take 1 tablet (1 g total) by mouth 4 (four) times daily -  with meals and at bedtime. 120 tablet 0   No current facility-administered medications for this visit.    PHYSICAL EXAMINATION: ECOG PERFORMANCE STATUS: 1 - Symptomatic but completely ambulatory  Vitals:   02/28/21 1211  BP: (!) 194/94  Pulse: 74  Resp: 18  Temp: (!) 97.5 F (36.4 C)  SpO2: 96%   Filed Weights   02/28/21 1211  Weight: 178 lb 3.2 oz (80.8 kg)    GENERAL:alert, no distress and comfortable Musculoskeletal:no cyanosis of digits and no clubbing  NEURO: alert & oriented x 3 with fluent speech, no focal motor/sensory deficits  LABORATORY DATA:  I have reviewed the data as listed    Component Value Date/Time   NA 140 12/09/2020 1219   K 4.2 12/09/2020 1219   CL 106 12/09/2020 1219   CO2 24 12/09/2020 1219   GLUCOSE 100 (H) 12/09/2020 1219   BUN 17 12/09/2020 1219   CREATININE 1.01 (H) 12/09/2020 1219   CREATININE 0.89 (H) 08/04/2020 1549   CALCIUM 9.9 12/09/2020 1219   PROT 7.6 12/09/2020 1219   ALBUMIN 4.0 12/09/2020 1219   AST 20 12/09/2020 1219   ALT 14 12/09/2020 1219   ALKPHOS 70 12/09/2020 1219   BILITOT 0.6 12/09/2020 1219   GFRNONAA 56 (L) 12/09/2020 1219   GFRNONAA 61 08/04/2020 1549   GFRAA 71 08/04/2020 1549    No results found for: SPEP, UPEP  Lab Results  Component Value Date   WBC 8.0 02/28/2021   NEUTROABS 4.9 02/28/2021   HGB 14.3 02/28/2021   HCT 47.0 (H) 02/28/2021   MCV 83.8 02/28/2021   PLT 430 (H) 02/28/2021      Chemistry      Component Value Date/Time   NA 140 12/09/2020 1219   K 4.2 12/09/2020 1219   CL 106 12/09/2020 1219   CO2 24 12/09/2020 1219   BUN 17 12/09/2020 1219   CREATININE 1.01 (H) 12/09/2020 1219   CREATININE 0.89 (H) 08/04/2020 1549      Component Value Date/Time   CALCIUM 9.9 12/09/2020 1219   ALKPHOS 70 12/09/2020 1219   AST 20  12/09/2020 1219   ALT 14 12/09/2020 1219   BILITOT 0.6 12/09/2020 1219

## 2021-02-28 NOTE — Assessment & Plan Note (Signed)
She is at risk of recurrent clots due to poor oral fluid intake and slight dehydration She will continue anticoagulation therapy indefinitely but I recommend her to increase oral fluid intake as much as possible

## 2021-02-28 NOTE — Patient Instructions (Signed)
1) Increase Hydrea, take 2 capsules on Sundays, the rest of the week take 1 capsule 2) Drink at least 64 ounces of water per day

## 2021-02-28 NOTE — Assessment & Plan Note (Signed)
Even though her platelet count is better, she has slight worsening erythrocytosis I suspect there is a component of dehydration I recommend increasing hydroxyurea to 1000 mg on Sundays but keep the rest of the week at 500 mg I plan to see her again in the month for further follow-up

## 2021-02-28 NOTE — Assessment & Plan Note (Signed)
She has worsening blood pressure control likely secondary to secondary erythrocytosis I recommend her to monitor her blood pressure carefully at home and to follow-up with primary care doctor for medication adjustment

## 2021-03-09 ENCOUNTER — Encounter: Payer: Self-pay | Admitting: Internal Medicine

## 2021-03-09 ENCOUNTER — Other Ambulatory Visit: Payer: Self-pay

## 2021-03-09 ENCOUNTER — Ambulatory Visit: Payer: Medicare PPO | Admitting: Internal Medicine

## 2021-03-09 VITALS — BP 140/86 | HR 80 | Temp 97.5°F | Resp 17 | Ht 63.0 in | Wt 172.4 lb

## 2021-03-09 DIAGNOSIS — Z8719 Personal history of other diseases of the digestive system: Secondary | ICD-10-CM

## 2021-03-09 DIAGNOSIS — Z79899 Other long term (current) drug therapy: Secondary | ICD-10-CM | POA: Diagnosis not present

## 2021-03-09 DIAGNOSIS — Z0001 Encounter for general adult medical examination with abnormal findings: Secondary | ICD-10-CM

## 2021-03-09 DIAGNOSIS — E559 Vitamin D deficiency, unspecified: Secondary | ICD-10-CM | POA: Diagnosis not present

## 2021-03-09 DIAGNOSIS — I10 Essential (primary) hypertension: Secondary | ICD-10-CM

## 2021-03-09 DIAGNOSIS — Z7901 Long term (current) use of anticoagulants: Secondary | ICD-10-CM | POA: Diagnosis not present

## 2021-03-09 DIAGNOSIS — Z0000 Encounter for general adult medical examination without abnormal findings: Secondary | ICD-10-CM | POA: Diagnosis not present

## 2021-03-09 DIAGNOSIS — R7309 Other abnormal glucose: Secondary | ICD-10-CM

## 2021-03-09 DIAGNOSIS — Z8249 Family history of ischemic heart disease and other diseases of the circulatory system: Secondary | ICD-10-CM

## 2021-03-09 DIAGNOSIS — Z86718 Personal history of other venous thrombosis and embolism: Secondary | ICD-10-CM | POA: Diagnosis not present

## 2021-03-09 DIAGNOSIS — D471 Chronic myeloproliferative disease: Secondary | ICD-10-CM | POA: Diagnosis not present

## 2021-03-09 DIAGNOSIS — Z136 Encounter for screening for cardiovascular disorders: Secondary | ICD-10-CM

## 2021-03-09 DIAGNOSIS — Z1211 Encounter for screening for malignant neoplasm of colon: Secondary | ICD-10-CM

## 2021-03-09 DIAGNOSIS — E782 Mixed hyperlipidemia: Secondary | ICD-10-CM | POA: Diagnosis not present

## 2021-03-09 DIAGNOSIS — Z1212 Encounter for screening for malignant neoplasm of rectum: Secondary | ICD-10-CM

## 2021-03-09 DIAGNOSIS — K219 Gastro-esophageal reflux disease without esophagitis: Secondary | ICD-10-CM | POA: Diagnosis not present

## 2021-03-09 NOTE — Progress Notes (Signed)
Annual Screening/Preventative Visit & Comprehensive Evaluation &  Examination  Future Appointments  Date Time Provider [place]  03/09/2021 11:00 AM Unk Pinto, MD [facility] None  04/01/2021 12:20 PM [name], MD [facility] None  08/04/2021  2:00 PM Liane Comber, NP [facility] None  11/03/2021  2:00 PM Philemon Kingdom, MD [facility] None  03/09/2022 11:00 AM Unk Pinto, MD [facility] None        This very nice 80 y.o. DWF presents for a Screening /Preventative Visit & comprehensive evaluation and management of multiple medical co-morbidities.  Patient has been followed for HTN, HLD, Prediabetes  and Vitamin D Deficiency. Patient is followed by Dr Cruzita Lederer for Osteoporosis.                                            Patient has hx/o intolerance to Fosamax and Reclast and in 2017 had DVT & PE's consequent of Evista Tx and is followed now By Dr C. Gherghe treating her Osteoporosis with Prolia. 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          HTN predates since 1980's. Patient's BP has been controlled at home and patient denies any cardiac symptoms as chest pain, palpitations, shortness of breath, dizziness or ankle swelling. Today's BP was initially elevated & rechecked at goal  - 140/86.       Patient's hyperlipidemia is controlled with diet and medications. Patient denies myalgias or other medication SE's. Last lipids were at goal:  Lab Results  Component Value Date   CHOL 155 03/09/2021   HDL 57 03/09/2021   LDLCALC 80 03/09/2021   TRIG 99 03/09/2021   CHOLHDL 2.7 03/09/2021         Patient  is monitored expectantly for glucose intolerance and patient denies reactive hypoglycemic symptoms, visual blurring, diabetic polys or paresthesias. Last A1c was normal  & at goal:  Lab Results  Component Value Date   HGBA1C 5.5 03/09/2021         Finally, patient has history of Vitamin D  Deficiency and last Vitamin D was at goal:  Lab Results  Component Value Date   VD25OH 77 03/09/2021     Current Outpatient Medications on File Prior to Visit  Medication Sig   ELIQUIS  5 MG TABS tablet Take 1 tablet 2  times daily.   VITAMIN C  Take 1 tablet daily.   OS-CAL 600 MG TABS tablet Take daily with breakfast.    VITAMIN D 5000  u Take 5,000 Units Monday thru Saturday only.   hydroxyurea 500 MG capsule Take 1 capsule daily except on Sundays take 2 capsules   Magnesium 100 MG TABS Take daily.   MAGNESIUM 500 mg  Take  daily.   metoprolol succinate-XL 100 MG  TAKE 1/2 TABLET DAILY FOR BLOOD PRESSURE   LIQUIFILM TEARS 1.4 % ophth  soln Place 1 drop into both eyes as needed for dry eyes.   sodium chloride  0.65 % SOLN nasal spray Place 1 spray into both nostrils as needed for congestion.   sucralfate 1 g tablet Take 1 tablet 4 times daily - meals & bedtime.   vitamin C 500 MG tablet Take daily    Allergies  Allergen Reactions   Evista [Raloxifene] Other (See Comments)    Blood clots -  DVT and Pulmonary Embolism    Fosamax [Alendronate Sodium]     esophagitis    Past Medical History:  Diagnosis Date   AKI (acute kidney injury) ([place]) 08/19/2015   Cataract    Clotting disorder ([place])    DVT of lower extremity (deep venous thrombosis) ([facility])    left, resolved   Elevated hemoglobin A1c    GERD (gastroesophageal reflux disease)    H/O left knee surgery    Hypertension    JAK2 gene mutation    Leiomyoma    adnexal Leiomyoma removal in 1/11   Myeloproliferative disorder ([place])    ? P vera   Nephrolithiasis    Osteoporosis    PE (pulmonary embolism) 08/2015     Health Maintenance  Topic Date Due   Zoster Vaccines- Shingrix (1 of 2) Never done   INFLUENZA VACCINE  02/28/2021   COVID-19 Vaccine (5 - Booster for Pfizer series) 04/12/2021   MAMMOGRAM  07/20/2021   TETANUS/TDAP  12/26/2028   DEXA SCAN  Completed   PNA vac Low Risk Adult  Completed   HPV VACCINES   Aged Out     Immunization History  Administered Date(s) Administered   DT (Pediatric) 03/30/2015   Fluad Quad(high Dose 65+) 07/01/2020   Influenza, High Dose Seasonal PF 04/20/2016, 05/02/2017, 05/20/2018   PFIZER Comirnaty(Gray Top)Covid-19 Tri-Sucrose Vaccine 12/10/2020   PFIZER(Purple Top)SARS-COV-2 Vaccination 09/13/2019, 10/06/2019, 06/15/2020   Pneumococcal Conjugate-13 03/25/2014   Tdap 12/27/2018   Zoster, Live 12/27/2005     Last Colon - 11/01/2017 - Dr Carlean Purl - Recc no f/u   Last MGM - 07/20/2020    Past Surgical History:  Procedure Laterality Date   CHOLECYSTECTOMY     cyst removed off spine     ESOPHAGOGASTRODUODENOSCOPY (EGD) WITH PROPOFOL N/A 11/20/2020   Procedure: ESOPHAGOGASTRODUODENOSCOPY (EGD) WITH PROPOFOL;  Surgeon: Mauri Pole, MD;  Location: [facility];  Service: Endoscopy;  Laterality: N/A;   IR IVC FILTER PLMT / S&I /IMG GUID/MOD SED  11/19/2020   IVC FILTER PLACEMENT ([place] HX)  08/2015   JOINT REPLACEMENT Left total knee   TONSILLECTOMY     UTERINE FIBROID SURGERY      Family History  Problem Relation Age of Onset   Hypertension Mother    Diabetes Mother    Breast cancer Mother    Congestive Heart Failure Mother        Died age 60   Osteoporosis Father    Colon cancer Neg Hx    Colon polyps Neg Hx    Esophageal cancer Neg Hx    Rectal cancer Neg Hx    Stomach cancer Neg Hx     Social History   Tobacco Use   Smoking status: Never   Smokeless tobacco: Never  Vaping Use   Vaping Use: Never used  Substance Use Topics   Alcohol use: No   Drug use: No     ROS Constitutional: Denies fever, chills, weight loss/gain, headaches, insomnia,  night sweats, and change in appetite. Does c/o fatigue. Eyes: Denies redness, blurred vision, diplopia, discharge, itchy, watery eyes.  ENT: Denies discharge, congestion, post nasal drip, epistaxis, sore throat, earache, hearing loss, dental pain, Tinnitus, Vertigo, Sinus pain, snoring.   Cardio: Denies chest pain, palpitations, irregular heartbeat, syncope, dyspnea, diaphoresis, orthopnea, PND, claudication, edema Respiratory: denies cough, dyspnea, DOE, pleurisy, hoarseness, laryngitis, wheezing.  Gastrointestinal: Denies dysphagia, heartburn, reflux, water brash, pain, cramps, nausea, vomiting, bloating, diarrhea, constipation, hematemesis, melena, hematochezia, jaundice, hemorrhoids Genitourinary: Denies dysuria, frequency, urgency,  nocturia, hesitancy, discharge, hematuria, flank pain Breast: Breast lumps, nipple discharge, bleeding.  Musculoskeletal: Denies arthralgia, myalgia, stiffness, Jt. Swelling, pain, limp, and strain/sprain. Denies falls. Skin: Denies puritis, rash, hives, warts, acne, eczema, changing in skin lesion Neuro: No weakness, tremor, incoordination, spasms, paresthesia, pain Psychiatric: Denies confusion, memory loss, sensory loss. Denies Depression. Endocrine: Denies change in weight, skin, hair change, nocturia, and paresthesia, diabetic polys, visual blurring, hyper / hypo glycemic episodes.  Heme/Lymph: No excessive bleeding, bruising, enlarged lymph nodes.  Physical Exam  BP 140/86   Pulse 80   Temp (!) 97.5 F (36.4 C)   Resp 17   Ht '5\' 3"'$  (1.6 m)   Wt 172 lb 6.4 oz (78.2 kg)   SpO2 96%   BMI 30.54 kg/m   General Appearance: Well nourished, well groomed and in no apparent distress.  Eyes: PERRLA, EOMs, conjunctiva no swelling or erythema, normal fundi and vessels. Sinuses: No frontal/maxillary tenderness ENT/Mouth: EACs patent / TMs  nl. Nares clear without erythema, swelling, mucoid exudates. Oral hygiene is good. No erythema, swelling, or exudate. Tongue normal, non-obstructing. Tonsils not swollen or erythematous. Hearing normal.  Neck: Supple, thyroid not palpable. No bruits, nodes or JVD. Respiratory: Respiratory effort normal.  BS equal and clear bilateral without rales, rhonci, wheezing or stridor. Cardio: Heart sounds are normal  with regular rate and rhythm and no murmurs, rubs or gallops. Peripheral pulses are normal and equal bilaterally without edema. No aortic or femoral bruits. Chest: symmetric with normal excursions and percussion. Breasts: Symmetric, without lumps, nipple discharge, retractions, or fibrocystic changes.  Abdomen: Flat, soft with bowel sounds active. Nontender, no guarding, rebound, hernias, masses, or organomegaly.  Lymphatics: Non tender without lymphadenopathy.  Genitourinary:  Musculoskeletal: Full ROM all peripheral extremities, joint stability, 5/5 strength, and normal gait. Skin: Warm and dry without rashes, lesions, cyanosis, clubbing or  ecchymosis.  Neuro: Cranial nerves intact, reflexes equal bilaterally. Normal muscle tone, no cerebellar symptoms. Sensation intact.  Pysch: Alert and oriented X 3, normal affect, Insight and Judgment appropriate.    Assessment and Plan  1. Annual Preventative Screening Examination   2. Essential hypertension  - EKG 12-Lead - Urinalysis, Routine w reflex microscopic - Microalbumin / creatinine urine ratio - CBC with Differential/Platelet - COMPLETE METABOLIC PANEL WITH GFR - Magnesium - TSH  3. Hyperlipidemia, mixed  - EKG 12-Lead - Lipid panel - TSH  4. Abnormal glucose  - EKG 12-Lead - Hemoglobin A1c - Insulin, random  5. Vitamin D deficiency  - VITAMIN D 25 Hydroxy (  6. Myeloproliferative disorder ([facility])  - CBC with Differential/Platelet  7. Gastroesophageal reflux disease  - CBC with Differential/Platelet  8. History of recurrent deep vein thrombosis (DVT)  - CBC with Differential/Platelet  9. 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 Screening for ischemic heart disease  - EKG 12-Lead  11. FHx: heart disease  - EKG 12-Lead  12. History of GI bleed  - POC Hemoccult Bld/Stl   13. Screening for colorectal cancer  - POC Hemoccult Bld/Stl   14. Medication  management  - Urinalysis, Routine w reflex microscopic - Microalbumin / creatinine urine ratio - CBC with Differential/Platelet - COMPLETE METABOLIC PANEL WITH GFR - Magnesium - Lipid panel - TSH - Hemoglobin A1c - Insulin, random - VITAMIN D 25 Hydroxy          Patient was counseled in prudent diet to achieve/maintain BMI less than 25 for weight control, BP monitoring, regular exercise and  medications. Discussed med's effects and SE's. Screening labs and tests as requested with regular follow-up as recommended. Over 40 minutes of exam, counseling, chart review and high complex critical decision making was performed.   Kirtland Bouchard, MD

## 2021-03-09 NOTE — Patient Instructions (Signed)

## 2021-03-10 LAB — LIPID PANEL
Cholesterol: 155 mg/dL (ref <200)
HDL: 57 mg/dL (ref >=50)
LDL Cholesterol (Calc): 80 mg/dL (calc)
Non-HDL Cholesterol (Calc): 98 mg/dL (calc) (ref <130)
Total CHOL/HDL Ratio: 2.7 (calc) (ref <5.0)
Triglycerides: 99 mg/dL (ref <150)

## 2021-03-10 LAB — CBC WITH DIFFERENTIAL/PLATELET
Absolute Monocytes: 628 cells/uL (ref 200–950)
Basophils Absolute: 138 cells/uL (ref 0–200)
Basophils Relative: 2 %
Eosinophils Absolute: 173 cells/uL (ref 15–500)
Eosinophils Relative: 2.5 %
HCT: 49.3 % — ABNORMAL HIGH (ref 35.0–45.0)
Hemoglobin: 15.1 g/dL (ref 11.7–15.5)
Lymphs Abs: 2042 cells/uL (ref 850–3900)
MCH: 24.7 pg — ABNORMAL LOW (ref 27.0–33.0)
MCHC: 30.6 g/dL — ABNORMAL LOW (ref 32.0–36.0)
MCV: 80.6 fL (ref 80.0–100.0)
MPV: 10.6 fL (ref 7.5–12.5)
Monocytes Relative: 9.1 %
Neutro Abs: 3919 cells/uL (ref 1500–7800)
Neutrophils Relative %: 56.8 %
Platelets: 472 10*3/uL — ABNORMAL HIGH (ref 140–400)
RBC: 6.12 10*6/uL — ABNORMAL HIGH (ref 3.80–5.10)
RDW: 18.3 % — ABNORMAL HIGH (ref 11.0–15.0)
Total Lymphocyte: 29.6 %
WBC: 6.9 10*3/uL (ref 3.8–10.8)

## 2021-03-10 LAB — COMPLETE METABOLIC PANEL WITH GFR
AG Ratio: 1.4 (calc) (ref 1.0–2.5)
ALT: 14 U/L (ref 6–29)
AST: 19 U/L (ref 10–35)
Albumin: 4.3 g/dL (ref 3.6–5.1)
Alkaline phosphatase (APISO): 62 U/L (ref 37–153)
BUN: 17 mg/dL (ref 7–25)
CO2: 25 mmol/L (ref 20–32)
Calcium: 9.7 mg/dL (ref 8.6–10.4)
Chloride: 103 mmol/L (ref 98–110)
Creat: 0.87 mg/dL (ref 0.60–0.95)
Globulin: 3.1 g/dL (calc) (ref 1.9–3.7)
Glucose, Bld: 86 mg/dL (ref 65–99)
Potassium: 4.8 mmol/L (ref 3.5–5.3)
Sodium: 137 mmol/L (ref 135–146)
Total Bilirubin: 0.7 mg/dL (ref 0.2–1.2)
Total Protein: 7.4 g/dL (ref 6.1–8.1)
eGFR: 67 mL/min/{1.73_m2} (ref >=60)

## 2021-03-10 LAB — MICROSCOPIC MESSAGE

## 2021-03-10 LAB — INSULIN, RANDOM: Insulin: 9.1 u[IU]/mL

## 2021-03-10 LAB — TSH: TSH: 1.23 mIU/L (ref 0.40–4.50)

## 2021-03-10 LAB — HEMOGLOBIN A1C
Hgb A1c MFr Bld: 5.5 % of total Hgb (ref <5.7)
Mean Plasma Glucose: 111 mg/dL
eAG (mmol/L): 6.2 mmol/L

## 2021-03-10 LAB — URINALYSIS, ROUTINE W REFLEX MICROSCOPIC
Bacteria, UA: NONE SEEN /HPF
Bilirubin Urine: NEGATIVE
Glucose, UA: NEGATIVE
Hgb urine dipstick: NEGATIVE
Hyaline Cast: NONE SEEN /LPF
Ketones, ur: NEGATIVE
Nitrite: NEGATIVE
Protein, ur: NEGATIVE
RBC / HPF: NONE SEEN /HPF (ref 0–2)
Specific Gravity, Urine: 1.004 (ref 1.001–1.035)
pH: 5.5 (ref 5.0–8.0)

## 2021-03-10 LAB — MICROALBUMIN / CREATININE URINE RATIO
Creatinine, Urine: 19 mg/dL — ABNORMAL LOW (ref 20–275)
Microalb Creat Ratio: 84 mcg/mg creat — ABNORMAL HIGH (ref <30)
Microalb, Ur: 1.6 mg/dL

## 2021-03-10 LAB — VITAMIN D 25 HYDROXY (VIT D DEFICIENCY, FRACTURES): Vit D, 25-Hydroxy: 77 ng/mL (ref 30–100)

## 2021-03-10 LAB — MAGNESIUM: Magnesium: 1.8 mg/dL (ref 1.5–2.5)

## 2021-03-12 NOTE — Progress Notes (Signed)
============================================================ ============================================================  -     Magnesium  -  1.6    -  very  low- goal is betw 2.0 - 2.5,   - So..............Marland Kitchen  Recommend that you take  Magnesium 500 mg tablet daily   - also important to eat lots of  leafy green vegetables   - spinach - Kale - collards - greens - okra - asparagus  - broccoli - quinoa - squash - almonds   - black, red, white beans  -  peas - green beans ============================================================ ============================================================  -  Total Chol = 155    &    LDL  Chol = 80    - Both  Excellent   - Very low risk for Heart Attack  / Stroke ============================================================ ============================================================  -  A1c - Normal - No Diabetes - Great ! ============================================================ ============================================================  -  Vitamin D = 77 - excellent  ============================================================ ============================================================  -  All Else - CBC - Kidneys - Electrolytes - Liver - Magnesium & Thyroid    - all  Normal / OK ============================================================ ============================================================  -  Keep up the [place] Work  !  ============================================================ ============================================================

## 2021-03-14 ENCOUNTER — Encounter: Payer: Medicare PPO | Admitting: Internal Medicine

## 2021-03-21 ENCOUNTER — Telehealth: Payer: Self-pay

## 2021-03-21 NOTE — Telephone Encounter (Signed)
Returned her call. She is requesting a  90 day Rx refill of the Eliquis. She ask that the Rx be sent to [facility].

## 2021-03-22 ENCOUNTER — Other Ambulatory Visit: Payer: Self-pay | Admitting: Hematology and Oncology

## 2021-03-22 MED ORDER — APIXABAN 5 MG PO TABS: 5.0000 mg | ORAL_TABLET | Freq: Two times a day (BID) | ORAL | 0 refills | Status: DC

## 2021-03-22 NOTE — Telephone Encounter (Signed)
Done, just because I wrote 90 days supply does not guarantee that's what her insurance will allow, FYI

## 2021-04-01 ENCOUNTER — Encounter: Payer: Self-pay | Admitting: Hematology and Oncology

## 2021-04-01 ENCOUNTER — Other Ambulatory Visit: Payer: Self-pay

## 2021-04-01 ENCOUNTER — Inpatient Hospital Stay: Payer: Medicare PPO | Attending: Hematology and Oncology

## 2021-04-01 ENCOUNTER — Inpatient Hospital Stay: Payer: Medicare PPO | Admitting: Hematology and Oncology

## 2021-04-01 DIAGNOSIS — I82402 Acute embolism and thrombosis of unspecified deep veins of left lower extremity: Secondary | ICD-10-CM

## 2021-04-01 DIAGNOSIS — Z79899 Other long term (current) drug therapy: Secondary | ICD-10-CM | POA: Diagnosis not present

## 2021-04-01 DIAGNOSIS — D471 Chronic myeloproliferative disease: Secondary | ICD-10-CM

## 2021-04-01 DIAGNOSIS — I10 Essential (primary) hypertension: Secondary | ICD-10-CM | POA: Diagnosis not present

## 2021-04-01 DIAGNOSIS — I82412 Acute embolism and thrombosis of left femoral vein: Secondary | ICD-10-CM

## 2021-04-01 DIAGNOSIS — Z7901 Long term (current) use of anticoagulants: Secondary | ICD-10-CM | POA: Insufficient documentation

## 2021-04-01 DIAGNOSIS — K250 Acute gastric ulcer with hemorrhage: Secondary | ICD-10-CM

## 2021-04-01 DIAGNOSIS — Z86718 Personal history of other venous thrombosis and embolism: Secondary | ICD-10-CM | POA: Diagnosis not present

## 2021-04-01 DIAGNOSIS — D751 Secondary polycythemia: Secondary | ICD-10-CM | POA: Diagnosis not present

## 2021-04-01 LAB — CBC WITH DIFFERENTIAL/PLATELET
Abs Immature Granulocytes: 0.01 10*3/uL (ref 0.00–0.07)
Basophils Absolute: 0.1 10*3/uL (ref 0.0–0.1)
Basophils Relative: 2 %
Eosinophils Absolute: 0.1 10*3/uL (ref 0.0–0.5)
Eosinophils Relative: 1 %
HCT: 49 % — ABNORMAL HIGH (ref 36.0–46.0)
Hemoglobin: 15 g/dL (ref 12.0–15.0)
Immature Granulocytes: 0 %
Lymphocytes Relative: 24 %
Lymphs Abs: 1.7 10*3/uL (ref 0.7–4.0)
MCH: 25.7 pg — ABNORMAL LOW (ref 26.0–34.0)
MCHC: 30.6 g/dL (ref 30.0–36.0)
MCV: 84 fL (ref 80.0–100.0)
Monocytes Absolute: 0.6 10*3/uL (ref 0.1–1.0)
Monocytes Relative: 9 %
Neutro Abs: 4.5 10*3/uL (ref 1.7–7.7)
Neutrophils Relative %: 64 %
Platelets: 424 10*3/uL — ABNORMAL HIGH (ref 150–400)
RBC: 5.83 MIL/uL — ABNORMAL HIGH (ref 3.87–5.11)
RDW: 21.4 % — ABNORMAL HIGH (ref 11.5–15.5)
WBC: 7.1 10*3/uL (ref 4.0–10.5)
nRBC: 0 % (ref 0.0–0.2)

## 2021-04-01 MED ORDER — HYDROXYUREA 500 MG PO CAPS: ORAL_CAPSULE | ORAL | 3 refills | Status: DC

## 2021-04-01 MED ORDER — APIXABAN 5 MG PO TABS
5.0000 mg | ORAL_TABLET | Freq: Two times a day (BID) | ORAL | 3 refills | Status: DC
Start: 2021-04-01 — End: 2022-08-10

## 2021-04-01 NOTE — Progress Notes (Signed)
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 and that likely contributed to her hypertension and could potentially increase her risk of blood clots I recommend increasing hydroxyurea further: She will increase hydroxyurea to 1000 mg to be taken on Saturdays and Sundays and to take 500 mg for the rest of the week Plan to see her again in about 6 weeks for further follow-up  Essential hypertension She has worsening blood pressure control 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   All questions were answered. The patient knows to call the clinic with any problems, questions or concerns. The total time spent in the appointment was 20 minutes encounter with patients including review of chart and various tests results, discussions about plan of care and coordination of care plan   [name], MD 04/01/2021 12:32 PM  INTERVAL HISTORY: Please see below for problem oriented charting. she returns for treatment follow-up for history of DVT and myeloproliferative disorder She  denies missing doses According to the patient, her documented blood pressure at home is much better than today She denies recent headaches No recent bleeding complications from her anticoagulation therapy  REVIEW OF SYSTEMS:   Constitutional: Denies fevers, chills or abnormal weight loss Eyes: Denies blurriness of vision Ears, nose, mouth, throat, and face: Denies mucositis or sore throat Respiratory: Denies cough, dyspnea or wheezes Cardiovascular: Denies palpitation, chest discomfort or lower extremity swelling Gastrointestinal:  Denies nausea, heartburn or change in bowel habits Skin: Denies abnormal skin rashes Lymphatics: Denies new lymphadenopathy or easy bruising Neurological:Denies numbness, tingling or new weaknesses Behavioral/Psych: Mood is stable, no new changes  All other systems were reviewed with the patient and are negative.  I have reviewed the past medical history, past surgical history, social history and family history with the patient and they are unchanged from previous note.  ALLERGIES:  is allergic to evista [raloxifene] and fosamax [alendronate sodium].  MEDICATIONS:  Current Outpatient Medications  Medication Sig Dispense Refill   apixaban (ELIQUIS) 5 MG TABS tablet Take 1 tablet (5 mg total) by mouth 2 (two) times daily. 180 tablet 3   Ascorbic Acid (VITAMIN C PO) Take 1 tablet by mouth daily.     calcium carbonate (OS-CAL) 600 MG TABS tablet Take 600 mg by mouth daily with breakfast.      Cholecalciferol (DIALYVITE VITAMIN D 5000) 125 MCG (5000 UT) capsule Take 5,000 Units by mouth daily. Take Monday thru Saturday only.     hydroxyurea (HYDREA) 500 MG capsule Take 1 capsule daily except on Saturdays and Sundays take 2 capsules 90 capsule 3   Magnesium 100 MG TABS Take 100 mg by mouth daily.     MAGNESIUM PO Take 500 mg by  mouth daily.     metoprolol succinate (TOPROL-XL) 100 MG 24 hr tablet TAKE 1/2 TABLET DAILY FOR BLOOD PRESSURE 90 tablet 3   polyvinyl  alcohol (LIQUIFILM TEARS) 1.4 % ophthalmic solution Place 1 drop into both eyes as needed for dry eyes.     sodium chloride (OCEAN) 0.65 % SOLN nasal spray Place 1 spray into both nostrils as needed for congestion.     sucralfate (CARAFATE) 1 g tablet Take 1 tablet (1 g total) by mouth 4 (four) times daily -  with meals and at bedtime. 120 tablet 0   vitamin C (ASCORBIC ACID) 500 MG tablet Take 500 mg by mouth daily. With meal     No current facility-administered medications for this visit.    SUMMARY OF ONCOLOGIC HISTORY: Oncology History   No history exists.    PHYSICAL EXAMINATION: ECOG PERFORMANCE STATUS: 1 - Symptomatic but completely ambulatory  Vitals:   04/01/21 1147  BP: (!) 184/111  Pulse: 61  Resp: 18  Temp: 98 F (36.7 C)  SpO2: 97%   Filed Weights   04/01/21 1147  Weight: 173 lb (78.5 kg)    GENERAL:alert, no distress and comfortable NEURO: alert & oriented x 3 with fluent speech, no focal motor/sensory deficits  LABORATORY DATA:  I have reviewed the data as listed    Component Value Date/Time   NA 137 03/09/2021 1055   K 4.8 03/09/2021 1055   CL 103 03/09/2021 1055   CO2 25 03/09/2021 1055   GLUCOSE 86 03/09/2021 1055   BUN 17 03/09/2021 1055   CREATININE 0.87 03/09/2021 1055   CALCIUM 9.7 03/09/2021 1055   PROT 7.4 03/09/2021 1055   ALBUMIN 4.0 12/09/2020 1219   AST 19 03/09/2021 1055   ALT 14 03/09/2021 1055   ALKPHOS 70 12/09/2020 1219   BILITOT 0.7 03/09/2021 1055   GFRNONAA 56 (L) 12/09/2020 1219   GFRNONAA 61 08/04/2020 1549   GFRAA 71 08/04/2020 1549    No results found for: SPEP, UPEP  Lab Results  Component Value Date   WBC 7.1 04/01/2021   NEUTROABS 4.5 04/01/2021   HGB 15.0 04/01/2021   HCT 49.0 (H) 04/01/2021   MCV 84.0 04/01/2021   PLT 424 (H) 04/01/2021      Chemistry      Component Value Date/Time   NA 137 03/09/2021 1055   K 4.8 03/09/2021 1055   CL 103 03/09/2021 1055   CO2 25 03/09/2021 1055   BUN 17 03/09/2021  1055   CREATININE 0.87 03/09/2021 1055      Component Value Date/Time   CALCIUM 9.7 03/09/2021 1055   ALKPHOS 70 12/09/2020 1219   AST 19 03/09/2021 1055   ALT 14 03/09/2021 1055   BILITOT 0.7 03/09/2021 1055

## 2021-04-01 NOTE — Assessment & Plan Note (Signed)
She denies bleeding complication from Eliquis I recommend increasing dose of hydroxyurea as above to reduce her risk of thrombosis

## 2021-04-01 NOTE — Assessment & Plan Note (Signed)
She has worsening blood pressure control likely secondary to secondary erythrocytosis I recommend her to monitor her blood pressure carefully at home and to follow-up with primary care doctor for medication adjustment

## 2021-04-01 NOTE — Assessment & Plan Note (Signed)
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

## 2021-04-11 ENCOUNTER — Other Ambulatory Visit: Payer: Self-pay

## 2021-04-11 DIAGNOSIS — Z1211 Encounter for screening for malignant neoplasm of colon: Secondary | ICD-10-CM

## 2021-04-11 DIAGNOSIS — Z8719 Personal history of other diseases of the digestive system: Secondary | ICD-10-CM

## 2021-04-11 LAB — POC HEMOCCULT BLD/STL (HOME/3-CARD/SCREEN)
Card #2 Fecal Occult Blod, POC: NEGATIVE
Card #3 Fecal Occult Blood, POC: NEGATIVE
Fecal Occult Blood, POC: NEGATIVE

## 2021-04-12 DIAGNOSIS — Z1212 Encounter for screening for malignant neoplasm of rectum: Secondary | ICD-10-CM

## 2021-04-12 DIAGNOSIS — Z1211 Encounter for screening for malignant neoplasm of colon: Secondary | ICD-10-CM

## 2021-05-09 ENCOUNTER — Telehealth: Payer: Self-pay

## 2021-05-09 NOTE — Telephone Encounter (Signed)
Called and left a message asking her to call the office back regarding tomorrows appt. Need to move appt time tomorrow.

## 2021-05-09 NOTE — Telephone Encounter (Signed)
Called back and moved lab to 2 pm tomorrow and appt with Dr. Alvy Bimler at 2:45 pm. She is aware of appt times.

## 2021-05-10 ENCOUNTER — Inpatient Hospital Stay: Payer: Medicare PPO | Admitting: Hematology and Oncology

## 2021-05-10 ENCOUNTER — Inpatient Hospital Stay: Payer: Medicare PPO | Attending: Hematology and Oncology

## 2021-05-10 ENCOUNTER — Other Ambulatory Visit: Payer: Self-pay

## 2021-05-10 ENCOUNTER — Encounter: Payer: Self-pay | Admitting: Hematology and Oncology

## 2021-05-10 ENCOUNTER — Inpatient Hospital Stay: Payer: Medicare PPO

## 2021-05-10 VITALS — BP 133/92 | HR 73 | Temp 98.0°F | Resp 17

## 2021-05-10 DIAGNOSIS — Z79899 Other long term (current) drug therapy: Secondary | ICD-10-CM | POA: Diagnosis not present

## 2021-05-10 DIAGNOSIS — D471 Chronic myeloproliferative disease: Secondary | ICD-10-CM | POA: Diagnosis not present

## 2021-05-10 DIAGNOSIS — D751 Secondary polycythemia: Secondary | ICD-10-CM | POA: Diagnosis not present

## 2021-05-10 DIAGNOSIS — I10 Essential (primary) hypertension: Secondary | ICD-10-CM | POA: Diagnosis not present

## 2021-05-10 DIAGNOSIS — Z7901 Long term (current) use of anticoagulants: Secondary | ICD-10-CM | POA: Diagnosis not present

## 2021-05-10 DIAGNOSIS — I82402 Acute embolism and thrombosis of unspecified deep veins of left lower extremity: Secondary | ICD-10-CM

## 2021-05-10 DIAGNOSIS — K250 Acute gastric ulcer with hemorrhage: Secondary | ICD-10-CM

## 2021-05-10 LAB — CBC WITH DIFFERENTIAL/PLATELET
Abs Immature Granulocytes: 0.01 10*3/uL (ref 0.00–0.07)
Basophils Absolute: 0.1 10*3/uL (ref 0.0–0.1)
Basophils Relative: 2 %
Eosinophils Absolute: 0.2 10*3/uL (ref 0.0–0.5)
Eosinophils Relative: 3 %
HCT: 49.4 % — ABNORMAL HIGH (ref 36.0–46.0)
Hemoglobin: 15.5 g/dL — ABNORMAL HIGH (ref 12.0–15.0)
Immature Granulocytes: 0 %
Lymphocytes Relative: 34 %
Lymphs Abs: 2.3 10*3/uL (ref 0.7–4.0)
MCH: 28.2 pg (ref 26.0–34.0)
MCHC: 31.4 g/dL (ref 30.0–36.0)
MCV: 89.8 fL (ref 80.0–100.0)
Monocytes Absolute: 0.7 10*3/uL (ref 0.1–1.0)
Monocytes Relative: 10 %
Neutro Abs: 3.4 10*3/uL (ref 1.7–7.7)
Neutrophils Relative %: 51 %
Platelets: 399 10*3/uL (ref 150–400)
RBC: 5.5 MIL/uL — ABNORMAL HIGH (ref 3.87–5.11)
RDW: 21 % — ABNORMAL HIGH (ref 11.5–15.5)
WBC: 6.6 10*3/uL (ref 4.0–10.5)
nRBC: 0 % (ref 0.0–0.2)

## 2021-05-10 MED ORDER — HYDROXYUREA 500 MG PO CAPS: ORAL_CAPSULE | ORAL | 3 refills | Status: DC

## 2021-05-10 NOTE — Progress Notes (Signed)
[place] OFFICE PROGRESS NOTE  Patient Care Team: Unk Pinto, MD as PCP - General (Internal Medicine) Monna Fam, MD as Consulting Physician (Ophthalmology) Gatha Mayer, MD as Consulting Physician (Gastroenterology) Kathie Rhodes, MD (Inactive) as Consulting Physician (Urology) Meisinger, Sherren Mocha, MD as Consulting Physician (Obstetrics and Gynecology)  ASSESSMENT & PLAN:  Myeloproliferative disorder [facility]) Recent changes of hydroxyurea has improved control of her platelet count but she has significant signs of erythrocytosis Her elevated blood pressure is likely due to this I recommend 1 unit of blood removed along with mild changes to her hydroxyurea Moving forward, she will take 1000 mg 3 times a week on Mondays, Wednesdays and Fridays and to take 500 mg for the rest of the week The risk, benefits, side effects of phlebotomy is discussed and she is in agreement to proceed  Essential hypertension Her elevated blood pressure today is due to secondary erythrocytosis We discussed importance of phlebotomy to avoid risk of blood clot and she is in agreement  No orders of the defined types were placed in this encounter.   All questions were answered. The patient knows to call the clinic with any problems, questions or concerns. The total time spent in the appointment was 20 minutes encounter with patients including review of chart and various tests results, discussions about plan of care and coordination of care plan   [name], MD 05/10/2021 5:04 PM  INTERVAL HISTORY: Please see below for problem oriented charting. she returns for treatment follow-up on hydroxyurea for JAK2 positive myeloproliferative disorder She is compliant taking her medications as directed Denies recent headache or changes in vision No chest pain or shortness of breath  REVIEW OF SYSTEMS:   Constitutional: Denies fevers, chills or abnormal weight loss Eyes: Denies blurriness of  vision Ears, nose, mouth, throat, and face: Denies mucositis or sore throat Respiratory: Denies cough, dyspnea or wheezes Cardiovascular: Denies palpitation, chest discomfort or lower extremity swelling Gastrointestinal:  Denies nausea, heartburn or change in bowel habits Skin: Denies abnormal skin rashes Lymphatics: Denies new lymphadenopathy or easy bruising Neurological:Denies numbness, tingling or new weaknesses Behavioral/Psych: Mood is stable, no new changes  All other systems were reviewed with the patient and are negative.  I have reviewed the past medical history, past surgical history, social history and family history with the patient and they are unchanged from previous note.  ALLERGIES:  is allergic to evista [raloxifene] and fosamax [alendronate sodium].  MEDICATIONS:  Current Outpatient Medications  Medication Sig Dispense Refill   apixaban (ELIQUIS) 5 MG TABS tablet Take 1 tablet (5 mg total) by mouth 2 (two) times daily. 180 tablet 3   Ascorbic Acid (VITAMIN C PO) Take 1 tablet by mouth daily.     calcium carbonate (OS-CAL) 600 MG TABS tablet Take 600 mg by mouth daily with breakfast.      Cholecalciferol (DIALYVITE VITAMIN D 5000) 125 MCG (5000 UT) capsule Take 5,000 Units by mouth daily. Take Monday thru Saturday only.     hydroxyurea (HYDREA) 500 MG capsule Take 1 capsule daily except on Mondays, Wednesdays and Fridays take 2 capsules 90 capsule 3   Magnesium 100 MG TABS Take 100 mg by mouth daily.     MAGNESIUM PO Take 500 mg by mouth daily.     metoprolol succinate (TOPROL-XL) 100 MG 24 hr tablet TAKE 1/2 TABLET DAILY FOR BLOOD PRESSURE 90 tablet 3   polyvinyl alcohol (LIQUIFILM TEARS) 1.4 % ophthalmic solution Place 1 drop into both eyes as needed for  dry eyes.     sodium chloride (OCEAN) 0.65 % SOLN nasal spray Place 1 spray into both nostrils as needed for congestion.     sucralfate (CARAFATE) 1 g tablet Take 1 tablet (1 g total) by mouth 4 (four) times daily -   with meals and at bedtime. 120 tablet 0   vitamin C (ASCORBIC ACID) 500 MG tablet Take 500 mg by mouth daily. With meal     No current facility-administered medications for this visit.    SUMMARY OF ONCOLOGIC HISTORY: Oncology History   No history exists.    PHYSICAL EXAMINATION: ECOG PERFORMANCE STATUS: 1 - Symptomatic but completely ambulatory  Vitals:   05/10/21 1438  BP: (!) 142/106  Pulse: 68  Resp: 18  Temp: (!) 97.4 F (36.3 C)  SpO2: 97%   Filed Weights   05/10/21 1438  Weight: 174 lb 12.8 oz (79.3 kg)    GENERAL:alert, no distress and comfortable SKIN: skin color, texture, turgor are normal, no rashes or significant lesions EYES: normal, Conjunctiva are pink and non-injected, sclera clear OROPHARYNX:no exudate, no erythema and lips, buccal mucosa, and tongue normal  NECK: supple, thyroid normal size, non-tender, without nodularity LYMPH:  no palpable lymphadenopathy in the cervical, axillary or inguinal LUNGS: clear to auscultation and percussion with normal breathing effort HEART: regular rate & rhythm and no murmurs and no lower extremity edema ABDOMEN:abdomen soft, non-tender and normal bowel sounds Musculoskeletal:no cyanosis of digits and no clubbing  NEURO: alert & oriented x 3 with fluent speech, no focal motor/sensory deficits  LABORATORY DATA:  I have reviewed the data as listed    Component Value Date/Time   NA 137 03/09/2021 1055   K 4.8 03/09/2021 1055   CL 103 03/09/2021 1055   CO2 25 03/09/2021 1055   GLUCOSE 86 03/09/2021 1055   BUN 17 03/09/2021 1055   CREATININE 0.87 03/09/2021 1055   CALCIUM 9.7 03/09/2021 1055   PROT 7.4 03/09/2021 1055   ALBUMIN 4.0 12/09/2020 1219   AST 19 03/09/2021 1055   ALT 14 03/09/2021 1055   ALKPHOS 70 12/09/2020 1219   BILITOT 0.7 03/09/2021 1055   GFRNONAA 56 (L) 12/09/2020 1219   GFRNONAA 61 08/04/2020 1549   GFRAA 71 08/04/2020 1549    No results found for: SPEP, UPEP  Lab Results  Component  Value Date   WBC 6.6 05/10/2021   NEUTROABS 3.4 05/10/2021   HGB 15.5 (H) 05/10/2021   HCT 49.4 (H) 05/10/2021   MCV 89.8 05/10/2021   PLT 399 05/10/2021      Chemistry      Component Value Date/Time   NA 137 03/09/2021 1055   K 4.8 03/09/2021 1055   CL 103 03/09/2021 1055   CO2 25 03/09/2021 1055   BUN 17 03/09/2021 1055   CREATININE 0.87 03/09/2021 1055      Component Value Date/Time   CALCIUM 9.7 03/09/2021 1055   ALKPHOS 70 12/09/2020 1219   AST 19 03/09/2021 1055   ALT 14 03/09/2021 1055   BILITOT 0.7 03/09/2021 1055

## 2021-05-10 NOTE — Assessment & Plan Note (Signed)
Recent changes of hydroxyurea has improved control of her platelet count but she has significant signs of erythrocytosis Her elevated blood pressure is likely due to this I recommend 1 unit of blood removed along with mild changes to her hydroxyurea Moving forward, she will take 1000 mg 3 times a week on Mondays, Wednesdays and Fridays and to take 500 mg for the rest of the week The risk, benefits, side effects of phlebotomy is discussed and she is in agreement to proceed

## 2021-05-10 NOTE — Patient Instructions (Signed)

## 2021-05-10 NOTE — Progress Notes (Signed)
Pt tolerated phlebotomy well. Post 30 min monitoring completed, offered pt food and beverages, VSS. All questions answered.

## 2021-05-10 NOTE — Assessment & Plan Note (Signed)
Her elevated blood pressure today is due to secondary erythrocytosis We discussed importance of phlebotomy to avoid risk of blood clot and she is in agreement

## 2021-06-09 ENCOUNTER — Ambulatory Visit: Payer: Medicare PPO | Admitting: Adult Health

## 2021-06-09 NOTE — Telephone Encounter (Signed)
Pt ready for scheduling on or after 07/14/21  Out-of-pocket cost due at time of visit: $0.00  Primary: [employer] [place] co-insurance: 0% Admin fee co-insurance: 0%  Secondary: n/a [place] co-insurance:  Admin fee co-insurance:   Deductible: does not apply  Prior Auth: APPROVED PA# [id number] Valid: 11/24/19-07/30/21    ** This summary of benefits is an estimation of the patient's out-of-pocket cost. Exact cost may very based on individual plan coverage.

## 2021-06-20 ENCOUNTER — Other Ambulatory Visit: Payer: Self-pay | Admitting: Hematology and Oncology

## 2021-06-20 MED ORDER — HYDROXYUREA 500 MG PO CAPS: ORAL_CAPSULE | ORAL | 3 refills | Status: DC

## 2021-06-27 ENCOUNTER — Telehealth: Payer: Self-pay | Admitting: *Deleted

## 2021-06-27 NOTE — Telephone Encounter (Signed)
Patient called. Was informed by [facility] that her prescription for hydroxyurea 500 mg could not be filled until December 3. She is out of medicine. States pharmacy gave her 6 capsules last week, but now out of med.  Patient thinks problem with refill sent in on 06/20/21 may be due to change in prescription directions.  Note: On 05/10/21, Dr. Alvy Bimler changed directions to 2 capsules Mon-Wed-Fri and 1 capsule all other days. [facility]/20/[facility]  Contacted [facility]. Spoke with Ronalee Belts in pharmacy. He states insurance was issue. Per prescription, #40 capsules needed for month's supply and prescription is written for #90 for month. Advised him that medication adjustments/directions were changed by MD during course of previous prescription MD and that patient needed more of it to follow those directions. Ronalee Belts states he made note on patient profile and was able to process new Rx sent 06/20/21 - it will be available for pick up later today.  Contacted patient - LVM on named VM with information as stated above and that Rx will be available for pick up today.

## 2021-06-28 ENCOUNTER — Other Ambulatory Visit: Payer: Self-pay | Admitting: Internal Medicine

## 2021-06-28 DIAGNOSIS — I10 Essential (primary) hypertension: Secondary | ICD-10-CM

## 2021-06-29 ENCOUNTER — Ambulatory Visit: Payer: Medicare PPO | Admitting: Adult Health

## 2021-07-12 ENCOUNTER — Inpatient Hospital Stay: Payer: Medicare PPO

## 2021-07-12 ENCOUNTER — Other Ambulatory Visit: Payer: Self-pay

## 2021-07-12 ENCOUNTER — Inpatient Hospital Stay: Payer: Medicare PPO | Attending: Hematology and Oncology

## 2021-07-12 ENCOUNTER — Inpatient Hospital Stay: Payer: Medicare PPO | Admitting: Hematology and Oncology

## 2021-07-12 VITALS — BP 110/82 | HR 69 | Temp 97.9°F | Resp 16

## 2021-07-12 DIAGNOSIS — Z79899 Other long term (current) drug therapy: Secondary | ICD-10-CM | POA: Diagnosis not present

## 2021-07-12 DIAGNOSIS — D751 Secondary polycythemia: Secondary | ICD-10-CM | POA: Diagnosis not present

## 2021-07-12 DIAGNOSIS — I10 Essential (primary) hypertension: Secondary | ICD-10-CM | POA: Diagnosis not present

## 2021-07-12 DIAGNOSIS — Z7901 Long term (current) use of anticoagulants: Secondary | ICD-10-CM | POA: Diagnosis not present

## 2021-07-12 DIAGNOSIS — D471 Chronic myeloproliferative disease: Secondary | ICD-10-CM | POA: Insufficient documentation

## 2021-07-12 DIAGNOSIS — I82402 Acute embolism and thrombosis of unspecified deep veins of left lower extremity: Secondary | ICD-10-CM

## 2021-07-12 DIAGNOSIS — Z86718 Personal history of other venous thrombosis and embolism: Secondary | ICD-10-CM | POA: Insufficient documentation

## 2021-07-12 DIAGNOSIS — K250 Acute gastric ulcer with hemorrhage: Secondary | ICD-10-CM

## 2021-07-12 LAB — CBC WITH DIFFERENTIAL/PLATELET
Abs Immature Granulocytes: 0.02 10*3/uL (ref 0.00–0.07)
Basophils Absolute: 0.1 10*3/uL (ref 0.0–0.1)
Basophils Relative: 1 %
Eosinophils Absolute: 0.1 10*3/uL (ref 0.0–0.5)
Eosinophils Relative: 2 %
HCT: 49.5 % — ABNORMAL HIGH (ref 36.0–46.0)
Hemoglobin: 16 g/dL — ABNORMAL HIGH (ref 12.0–15.0)
Immature Granulocytes: 0 %
Lymphocytes Relative: 31 %
Lymphs Abs: 2.2 10*3/uL (ref 0.7–4.0)
MCH: 30.8 pg (ref 26.0–34.0)
MCHC: 32.3 g/dL (ref 30.0–36.0)
MCV: 95.4 fL (ref 80.0–100.0)
Monocytes Absolute: 0.6 10*3/uL (ref 0.1–1.0)
Monocytes Relative: 8 %
Neutro Abs: 4.2 10*3/uL (ref 1.7–7.7)
Neutrophils Relative %: 58 %
Platelets: 309 10*3/uL (ref 150–400)
RBC: 5.19 MIL/uL — ABNORMAL HIGH (ref 3.87–5.11)
RDW: 15.4 % (ref 11.5–15.5)
WBC: 7.2 10*3/uL (ref 4.0–10.5)
nRBC: 0 % (ref 0.0–0.2)

## 2021-07-12 MED ORDER — HYDROXYUREA 500 MG PO CAPS: ORAL_CAPSULE | ORAL | 3 refills | Status: DC

## 2021-07-12 NOTE — Progress Notes (Signed)
Kimberly Watson presents today for phlebotomy per MD ordrs. Phlebotomy procedure started at 1242 and ended at 1250. 516 grams removed. Patient observed for 30 minutes after procedure without any incident. Patient tolerated procedure well. IV needle removed intact. Food and fluids given.

## 2021-07-12 NOTE — Patient Instructions (Signed)

## 2021-07-13 ENCOUNTER — Encounter: Payer: Self-pay | Admitting: Hematology and Oncology

## 2021-07-13 NOTE — Assessment & Plan Note (Signed)
Her elevated blood pressure today is due to secondary erythrocytosis We discussed importance of phlebotomy to avoid risk of blood clot and she is in agreement

## 2021-07-13 NOTE — Assessment & Plan Note (Signed)
She denies bleeding complication from Eliquis I recommend increasing dose of hydroxyurea as above to reduce her risk of thrombosis

## 2021-07-13 NOTE — Assessment & Plan Note (Signed)
Recent changes of hydroxyurea has improved control of her platelet count but she has significant signs of erythrocytosis Her elevated blood pressure is likely due to this I recommend 1 unit of blood removed along with mild changes to her hydroxyurea Moving forward, she will take 1000 mg 4 times a week on Mondays, Wednesdays, Fridays and Saturdays and to take 500 mg for the rest of the week The risk, benefits, side effects of phlebotomy is discussed and she is in agreement to proceed

## 2021-07-13 NOTE — Progress Notes (Signed)
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[facility]) Recent changes of hydroxyurea has improved control of her platelet count but she has significant signs of erythrocytosis Her elevated blood pressure is likely due to this I recommend 1 unit of blood removed along with mild changes to her hydroxyurea Moving forward, she will take 1000 mg 4 times a week on Mondays, Wednesdays, Fridays and Saturdays and to take 500 mg for the rest of the week The risk, benefits, side effects of phlebotomy is discussed and she is in agreement to proceed  History of recurrent deep vein thrombosis (DVT) She denies bleeding complication from Eliquis I recommend increasing dose of hydroxyurea as above to reduce her risk of thrombosis  Essential hypertension Her elevated blood pressure today is due to secondary erythrocytosis We discussed importance of phlebotomy to avoid risk of blood clot and she is in agreement  No orders of the defined types were placed in this encounter.   All questions were answered. The patient knows to call the clinic with any problems, questions or concerns. The total time spent in the appointment was 30 minutes encounter with patients including review of chart and various tests results, discussions about plan of care and coordination of care plan   [name], MD 07/13/2021 10:39 AM  INTERVAL HISTORY: Please see below for problem oriented charting. she returns for treatment follow-up She tolerated recent phlebotomy She is taking medications as instructed but missed 2 doses recently due to  medication running out No recent infection, fever or chills Denies leg swelling No bleeding complications from anticoagulation therapy  REVIEW OF SYSTEMS:   Constitutional: Denies fevers, chills or abnormal weight loss Eyes: Denies blurriness of vision Ears, nose, mouth, throat, and face: Denies mucositis or sore throat Respiratory: Denies cough, dyspnea or wheezes Cardiovascular: Denies palpitation, chest discomfort or lower extremity swelling Gastrointestinal:  Denies nausea, heartburn or change in bowel habits Skin: Denies abnormal skin rashes Lymphatics: Denies new lymphadenopathy or easy bruising Neurological:Denies numbness, tingling or new weaknesses Behavioral/Psych: Mood is stable, no new changes  All other systems were reviewed with the patient and are negative.  I have reviewed the past medical history, past surgical history, social history and family history with the patient and they are unchanged from previous note.  ALLERGIES:  is allergic to evista [raloxifene] and fosamax [alendronate sodium].  MEDICATIONS:  Current Outpatient Medications  Medication Sig Dispense Refill   apixaban (ELIQUIS) 5 MG TABS tablet Take 1 tablet (5 mg total) by mouth 2 (two) times daily. 180 tablet 3   Ascorbic Acid (VITAMIN C PO) Take 1 tablet by mouth daily.     calcium carbonate (OS-CAL) 600 MG TABS tablet Take 600 mg by mouth daily with breakfast.      Cholecalciferol (DIALYVITE VITAMIN D 5000) 125 MCG (5000 UT) capsule Take 5,000 Units by mouth daily. Take Monday thru Saturday only.     hydroxyurea (HYDREA) 500 MG capsule Take 1 capsule daily except on Mondays, Wednesdays, Fridays and Saturdays, take 2 capsules 90 capsule 3   Magnesium 100 MG TABS Take 100 mg by mouth daily.     MAGNESIUM PO Take 500 mg by mouth daily.     metoprolol  succinate (TOPROL-XL) 100 MG 24 hr tablet Take  1 tablet  Daily for BP                                                      /                   TAKE 1 TABLET  BY MOUTH 90 tablet 3   polyvinyl alcohol (LIQUIFILM TEARS) 1.4 % ophthalmic solution Place 1 drop into both eyes as needed for dry eyes.     sodium chloride (OCEAN) 0.65 % SOLN nasal spray Place 1 spray into both nostrils as needed for congestion.     sucralfate (CARAFATE) 1 g tablet Take 1 tablet (1 g total) by mouth 4 (four) times daily -  with meals and at bedtime. 120 tablet 0   vitamin C (ASCORBIC ACID) 500 MG tablet Take 500 mg by mouth daily. With meal     No current facility-administered medications for this visit.    SUMMARY OF ONCOLOGIC HISTORY:  JEREMIE ABDELAZIZ was referred here after recent diagnosis of severe left lower extremity DVT with bilateral PE and cor pulmonale. I reviewed her records extensively and collaborated the history with the patient. The patient has been taking Evista for over 25 years due to strong family history of breast cancer and for osteoporosis management. In January 2017, she had viral gastroenteritis with severe diarrhea and dehydration. The patient has been taking Lasix chronically for mild bilateral lower extremity edema, left greater than the right. 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 She recalled having profuse diarrhea nonstop for 24 hours leading up to the blood clot event. Of note, prior to the diagnosis, she was not taking aspirin consistently  Prior to that she denies recent history of trauma, long distance travel, recent surgery, smoking or prolonged immobilization. She had no prior history or diagnosis of cancer. Her age appropriate screening programs are up-to-date. She had prior surgeries before and never had perioperative thromboembolic events. The patient had been exposed to birth control pills and never had thrombotic events. The patient had been pregnant before and denies history of peripartum thromboembolic event or history of recurrent miscarriages. There is no family history of  blood clots or miscarriages.  On 08/18/2015, she presented to the emergency department due to symptoms of severe weakness, chest pressure, shortness of breath and near syncopal episode. She had extensive evaluation in the emergency room. D-dimer was elevated at greater than 20.  CT angiogram dated 08/19/2015 show massive saddle pulmonary embolus with evidence of severe right heart strain.  Left lower extremity venous Doppler on 08/19/2015 showed findings consistent with acute deep vein thrombosis involving the left femoral vein, left popliteal vein, left posterior tibial vein and left peroneal vein.  Echocardiogram on 08/19/2015 show preserved ejection fraction but with right ventricular dilatation, moderate tricuspid valvular regurgitation, oscillating density on TV(thought to be blood clot in transit) and moderate to severe increased right systolic pressure/evidence of severe pulmonary hypertension  The patient was admitted with IV heparin anticoagulation therapy and placement of IVC filter on 08/20/2015.  Echocardiogram was repeated on 08/23/2015 which showed persistent elevated pulmonary hypertension. The mass over the right tricuspid valve is no longer visible She was hospitalized on 08/18/2015 to 08/24/2015. She was discharged  home on Eliquis.  Evista was discontinued. 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  Her anticoagulation was placed on hold recently and she developed acute DVT.  She had IVC filter placed on 11/19/2020 On 11/20/2020, she underwent repeat EGD due to GI bleed which showed - Z-line  regular, 36 cm from the incisors. - No gross lesions in esophagus. - Non-bleeding gastric ulcer with a clean ulcer base (Forrest Class III). - Normal examined duodenum. - No specimens collected. On Dec 09, 2020, she resume Hydrea On February 28, 2021, the dose of hydroxyurea is changed to 500 mg daily except on Sundays she will take 1000 mg by mouth On July 12, 2021, the dose of hydroxyurea is changed to 1000 mg 4 days a week and 500 mg the rest of the week  PHYSICAL EXAMINATION: ECOG PERFORMANCE STATUS: 1 - Symptomatic but completely ambulatory  Vitals:   07/12/21 1209  BP: (!) 151/92  Pulse: 66  Resp: 18  Temp: (!) 97.4 F (36.3 C)  SpO2: 96%   Filed Weights   07/12/21 1209  Weight: 174 lb (78.9 kg)    GENERAL:alert, no distress and comfortable  NEURO: alert & oriented x 3 with fluent speech, no focal motor/sensory deficits  LABORATORY DATA:  I have reviewed the data as listed    Component Value Date/Time   NA 137 03/09/2021 1055   K 4.8 03/09/2021 1055   CL 103 03/09/2021 1055   CO2 25 03/09/2021 1055   GLUCOSE 86 03/09/2021 1055   BUN 17 03/09/2021 1055   CREATININE 0.87 03/09/2021 1055   CALCIUM 9.7 03/09/2021 1055   PROT 7.4 03/09/2021 1055   ALBUMIN 4.0 12/09/2020 1219   AST 19 03/09/2021 1055   ALT 14 03/09/2021 1055   ALKPHOS 70 12/09/2020 1219   BILITOT 0.7 03/09/2021 1055   GFRNONAA 56 (L) 12/09/2020 1219   GFRNONAA 61 08/04/2020 1549   GFRAA 71 08/04/2020 1549    No results found for: SPEP, UPEP  Lab Results  Component Value Date   WBC 7.2 07/12/2021   NEUTROABS 4.2 07/12/2021   HGB 16.0 (H) 07/12/2021   HCT 49.5 (H) 07/12/2021   MCV 95.4 07/12/2021   PLT 309 07/12/2021      Chemistry      Component Value Date/Time   NA 137 03/09/2021 1055   K 4.8 03/09/2021 1055   CL 103 03/09/2021 1055   CO2 25 03/09/2021 1055   BUN 17 03/09/2021 1055   CREATININE 0.87 03/09/2021 1055      Component Value Date/Time   CALCIUM 9.7 03/09/2021  1055   ALKPHOS 70 12/09/2020 1219   AST 19 03/09/2021 1055   ALT 14 03/09/2021 1055   BILITOT 0.7 03/09/2021 1055

## 2021-07-15 ENCOUNTER — Ambulatory Visit: Payer: Medicare PPO

## 2021-07-20 ENCOUNTER — Other Ambulatory Visit: Payer: Self-pay

## 2021-07-20 ENCOUNTER — Ambulatory Visit (INDEPENDENT_AMBULATORY_CARE_PROVIDER_SITE_OTHER): Payer: Medicare PPO

## 2021-07-20 DIAGNOSIS — M810 Age-related osteoporosis without current pathological fracture: Secondary | ICD-10-CM | POA: Diagnosis not present

## 2021-07-20 MED ORDER — DENOSUMAB 60 MG/ML ~~LOC~~ SOSY
60.0000 mg | PREFILLED_SYRINGE | Freq: Once | SUBCUTANEOUS | Status: AC
  Administered 2021-07-20: 14:00:00 60 mg via SUBCUTANEOUS

## 2021-07-20 NOTE — Progress Notes (Signed)
Prolia injection administered to pt's right arm. Pt tolerated well.

## 2021-08-04 ENCOUNTER — Other Ambulatory Visit: Payer: Self-pay

## 2021-08-04 ENCOUNTER — Ambulatory Visit: Payer: Medicare PPO | Admitting: Adult Health

## 2021-08-04 ENCOUNTER — Encounter: Payer: Self-pay | Admitting: Adult Health

## 2021-08-04 VITALS — BP 136/88 | HR 70 | Temp 97.2°F | Wt 175.0 lb

## 2021-08-04 DIAGNOSIS — D471 Chronic myeloproliferative disease: Secondary | ICD-10-CM

## 2021-08-04 DIAGNOSIS — M810 Age-related osteoporosis without current pathological fracture: Secondary | ICD-10-CM

## 2021-08-04 DIAGNOSIS — E782 Mixed hyperlipidemia: Secondary | ICD-10-CM | POA: Diagnosis not present

## 2021-08-04 DIAGNOSIS — K449 Diaphragmatic hernia without obstruction or gangrene: Secondary | ICD-10-CM

## 2021-08-04 DIAGNOSIS — E669 Obesity, unspecified: Secondary | ICD-10-CM

## 2021-08-04 DIAGNOSIS — Z79899 Other long term (current) drug therapy: Secondary | ICD-10-CM

## 2021-08-04 DIAGNOSIS — Z86718 Personal history of other venous thrombosis and embolism: Secondary | ICD-10-CM

## 2021-08-04 DIAGNOSIS — R6889 Other general symptoms and signs: Secondary | ICD-10-CM | POA: Diagnosis not present

## 2021-08-04 DIAGNOSIS — K219 Gastro-esophageal reflux disease without esophagitis: Secondary | ICD-10-CM | POA: Diagnosis not present

## 2021-08-04 DIAGNOSIS — Z0000 Encounter for general adult medical examination without abnormal findings: Secondary | ICD-10-CM

## 2021-08-04 DIAGNOSIS — Z0001 Encounter for general adult medical examination with abnormal findings: Secondary | ICD-10-CM

## 2021-08-04 DIAGNOSIS — I10 Essential (primary) hypertension: Secondary | ICD-10-CM | POA: Diagnosis not present

## 2021-08-04 DIAGNOSIS — M1711 Unilateral primary osteoarthritis, right knee: Secondary | ICD-10-CM

## 2021-08-04 DIAGNOSIS — R7309 Other abnormal glucose: Secondary | ICD-10-CM

## 2021-08-04 DIAGNOSIS — E559 Vitamin D deficiency, unspecified: Secondary | ICD-10-CM

## 2021-08-04 DIAGNOSIS — N200 Calculus of kidney: Secondary | ICD-10-CM | POA: Diagnosis not present

## 2021-08-04 DIAGNOSIS — Z7901 Long term (current) use of anticoagulants: Secondary | ICD-10-CM | POA: Diagnosis not present

## 2021-08-04 DIAGNOSIS — K5790 Diverticulosis of intestine, part unspecified, without perforation or abscess without bleeding: Secondary | ICD-10-CM | POA: Diagnosis not present

## 2021-08-04 NOTE — Progress Notes (Signed)
Medicare wellness  Assessment:   Annual Medicare Wellness Visit Due annually  Health maintenance reviewed  Essential hypertension - continue medications, DASH diet, exercise and monitor at home. Call if greater than 130/80.  - CBC with Differential/Platelet - CMP/GFR  Mixed hyperlipidemia -continue medications, check lipids, decrease fatty foods, increase activity.  - Lipid panel - TSH  Other abnormal glucose Recent A1Cs at goal Discussed diet/exercise, weight management  Defer A1C; check CMP  Osteoporosis Continue CA and vitamin D Dr. Cruzita Lederer following for prolia    Vitamin D deficiency At goal at recent check; continue to recommend supplementation for goal of 60-100 Defer vitamin D level  Medication management - Magnesium  Nephrolithiasis Increase fluids  GERD/hiatal hernia Currently well managed by lifestyle  Obesity - BMI 30 Obesity with co morbidities- long discussion about weight loss, diet, and exercise  History of recurrent deep vein thrombosis (DVT) and PE; chronic anticoagulation Continue eliquis, no bleeding sx; Dr. Alvy Bimler is following  Myeloproliferative disorder [facility]) Dr. Alvy Bimler following, good response on hydroxyurea  R knee arthritis Managing with tylenol/rest  Dr. Wynelle Link follows but high risk for surgery- deferring Continue regular exercise for strength and balance     Future Appointments  Date Time Provider [place]  09/20/2021 11:45 AM [facility] None  09/20/2021 12:20 PM [name], MD [facility] None  11/03/2021  2:00 PM Philemon Kingdom, MD [facility] None  11/07/2021  2:30 PM Unk Pinto, MD [facility] None  03/09/2022 11:00 AM Unk Pinto, MD [facility] None  08/04/2022 11:00 AM Liane Comber, NP [facility] None     Plan:   During the course of the visit the patient was educated and counseled about appropriate screening and preventive services including:  6 month Influenza  vaccine- Screening electrocardiogram Screening mammography- yearly Bone densitometry screening Colorectal cancer screening Diabetes screening- annually Glaucoma screening- yearly Nutrition counseling  Advanced directives:    Subjective:   Kimberly Watson is a 81 y.o. female who presents for Medicare Annual Wellness Visit and 3 month follow up. She has Essential hypertension; Hyperlipidemia, mixed; Vitamin D deficiency; GERD (gastroesophageal reflux disease); Nephrolithiasis; Osteoporosis; Medication management; Obesity (BMI 30.0-34.9); History of recurrent deep vein thrombosis (DVT); Hiatal hernia; Diverticulosis; Osteoarthritis of right knee; Myeloproliferative disorder ([place]); 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  She has R knee bone on bone arthritis, follows with Dr. Wynelle Link, intermittently uses cane at home if needed for instability but reports limited pain.   She was on evista for osteoporosis, she was taken off due to submassive bilateral saddle PE with right heart strain in 2017. Has had recurrent DVT/PEs. She follows with Dr. Alvy Bimler, chronically on eliquis 5 mg BID. She was also diagnosed with myeloproliferative disorder, has therapeutic phlebotomies on hydroxyurea with apparent good response.   Now follows with Dr. Cruzita Lederer for osteoporosis, doing prolia. Last DEXA 07/2019, T-3.5 She has history of nephrolithiasis, follows with Dr. Karsten Ro.     GERD doing well with lifestyle modification. She does have hiatal hernia, hx of esophageal stricture, had dilation x 3, last in 2019.   BMI is Body mass index is 31 kg/m., she has not been working on diet and exercise. She was doing chair yoga in AM, weights in [place] with sister, got off track during the holidays, plans to restart. She does supine leg exercises Wt Readings from Last 3 Encounters:  08/04/21 175 lb (79.4 kg)  07/12/21 174 lb  (78.9 kg)  05/10/21 174 lb 12.8 oz (79.3 kg)   Her blood pressure has been controlled at home (intermittently elevated due to myeloproliferative disorder), today their BP is BP: 136/88  She denies chest pain, shortness of breath, dizziness.   Her cholesterol is diet controlled.  Her cholesterol is controlled. The cholesterol last visit was:   Lab Results  Component Value Date   CHOL 155 03/09/2021   HDL 57 03/09/2021   LDLCALC 80 03/09/2021   TRIG 99 03/09/2021   CHOLHDL 2.7 03/09/2021   She has been working on diet and exercise for glucose management, and has no polyuria or polydipsia, no chest pain, dyspnea or TIAs, no numbness, tingling or pain in extremities. Last A1C in the office was:  Lab Results  Component Value Date   HGBA1C 5.5 03/09/2021   She is working to get in 64 fluid ounces daily. Last GFR:  Lab Results  Component Value Date   GFRNONAA 56 (L) 12/09/2020   Patient is on Vitamin D supplement.  Lab Results  Component Value Date   VD25OH 77 03/09/2021        Medication Review Current Outpatient Medications on File Prior to Visit  Medication Sig Dispense Refill   apixaban (ELIQUIS) 5 MG TABS tablet Take 1 tablet (5 mg total) by mouth 2 (two) times daily. 180 tablet 3   Ascorbic Acid (VITAMIN C PO) Take 1 tablet by mouth daily.     calcium carbonate (OS-CAL) 600 MG TABS tablet Take 600 mg by mouth daily with breakfast.      Cholecalciferol (DIALYVITE VITAMIN D 5000) 125 MCG (5000 UT) capsule Take 5,000 Units by mouth daily. Take Monday thru Saturday only.     hydroxyurea (HYDREA) 500 MG capsule Take 1 capsule daily except on Mondays, Wednesdays, Fridays and Saturdays, take 2 capsules 90 capsule 3   MAGNESIUM PO Take 500 mg by mouth daily.     metoprolol succinate (TOPROL-XL) 100 MG 24 hr tablet Take  1 tablet  Daily for BP                                                      /                   TAKE 1 TABLET BY MOUTH 90 tablet 3   polyvinyl alcohol (LIQUIFILM  TEARS) 1.4 % ophthalmic solution Place 1 drop into both eyes as needed for dry eyes.     sodium chloride (OCEAN) 0.65 % SOLN nasal spray Place 1 spray into both nostrils as needed for congestion.     vitamin C (ASCORBIC ACID) 500 MG tablet Take 500 mg by mouth daily. With meal     sucralfate (CARAFATE) 1 g tablet Take 1 tablet (1 g total) by mouth 4 (four) times daily -  with meals and at bedtime. 120 tablet 0   No current facility-administered medications on file prior to visit.    Current Problems (verified) Patient Active Problem List   Diagnosis Date Noted   History of esophageal dilatation 11/02/2020   Poor vision 10/02/2019   Abnormal glucose 09/18/2019   Chronic anticoagulation (apixaban) due to DVT/PE 10/08/2017   Myeloproliferative disorder ([place]) 08/27/2017   Hiatal hernia 08/09/2017   Diverticulosis 08/09/2017   Osteoarthritis of right knee 08/09/2017   History of recurrent deep vein thrombosis (  DVT)    Osteoporosis 03/30/2015   Medication management 03/30/2015   Obesity (BMI 30.0-34.9) 03/30/2015   Essential hypertension 06/18/2013   Hyperlipidemia, mixed 06/18/2013   Vitamin D deficiency 06/18/2013   GERD (gastroesophageal reflux disease) 06/18/2013   Nephrolithiasis 06/18/2013   Screening Tests Immunization History  Administered Date(s) Administered   DT (Pediatric) 03/30/2015   Fluad Quad(high Dose 65+) 07/01/2020   Influenza, High Dose Seasonal PF 04/20/2016, 05/02/2017, 05/20/2018, 05/28/2021   PFIZER Comirnaty(Gray Top)Covid-19 Tri-Sucrose Vaccine 12/10/2020   PFIZER(Purple Top)SARS-COV-2 Vaccination 09/13/2019, 10/06/2019, 06/15/2020   Pneumococcal Conjugate-13 03/25/2014   Tdap 12/27/2018   Zoster, Live 12/27/2005    Preventative care: Last colonoscopy: 10/2017, no polyps, diverticulosis, Dr. Carlean Purl, no follow up recommended EGD: 10/2017 Last mammogram: 07/20/2020, has upcoming scheduled 08/2021 Last pap smear/pelvic exam: remote declines another DEXA:  07/03/2019 osteoporosis- had (reclast 08,09,11) - following Dr. Cruzita Lederer  Prior vaccinations: TD or Tdap: 2016 Influenza: 04/2021 Prevnar 13: 2015 Pneumococcal: 2008  Shingles/Zostavax: 2007 Covid 19: 2/2, 2021, pfizer last boosted 11/2020  Names of Other Physician/Practitioners you currently use: 1.  [facility]- here for primary care 2. Dr.Hecker, eye doctor, last visit Glasses/contacts, last 2021, has follow up planned Feb 2023 3. Judeth Horn Dentist, last visit 2022, every 6 months  Patient Care Team: Unk Pinto, MD as PCP - General (Internal Medicine) Monna Fam, MD as Consulting Physician (Ophthalmology) Gatha Mayer, MD as Consulting Physician (Gastroenterology) Kathie Rhodes, MD (Inactive) as Consulting Physician (Urology) Cheri Fowler, MD as Consulting Physician (Obstetrics and Gynecology)   Allergies Allergies  Allergen Reactions   Evista [Raloxifene] Other (See Comments)    Blood clots - DVT and Pulmonary Embolism    Fosamax [Alendronate Sodium]     esophagitis    SURGICAL HISTORY She  has a past surgical history that includes Joint replacement (Left, total knee); Uterine fibroid surgery; Cholecystectomy; IVC FILTER PLACEMENT ([facility] HX) (08/2015); Tonsillectomy; cyst removed off spine; IR IVC FILTER PLMT / S&I /IMG GUID/MOD SED (11/19/2020); and Esophagogastroduodenoscopy (egd) with propofol (N/A, 11/20/2020). FAMILY HISTORY Her family history includes Breast cancer in her mother; Congestive Heart Failure in her mother; Diabetes in her mother; Hypertension in her mother; Osteoporosis in her father. SOCIAL HISTORY She  reports that she has never smoked. She has never used smokeless tobacco. She reports that she does not drink alcohol and does not use drugs.  MEDICARE WELLNESS OBJECTIVES: Physical activity: Current Exercise Habits: The patient does not participate in regular exercise at present, Exercise limited by: orthopedic  condition(s) Cardiac risk factors: Cardiac Risk Factors include: advanced age (>61men, >24 women);dyslipidemia;hypertension;obesity (BMI >30kg/m2);sedentary lifestyle Depression/mood screen:   Depression screen [facility] 2/9 08/04/2021  Decreased Interest 0  Down, Depressed, Hopeless 0  PHQ - 2 Score 0    ADLs:  In your present state of health, do you have any difficulty performing the following activities: 08/04/2021 11/19/2020  Hearing? N -  Vision? N -  Comment managing, has some cataracts -  Difficulty concentrating or making decisions? N -  Walking or climbing stairs? N -  Comment uses cane if needed -  Dressing or bathing? N -  Doing errands, shopping? N N  Some recent data might be hidden    Cognitive Testing  Alert? Yes  Normal Appearance?Yes  Oriented to person? Yes  Place? Yes   Time? Yes  Recall of three objects?  Yes  Can perform simple calculations? Yes  Displays appropriate judgment?Yes  Can read the correct time from a watch face?Yes  EOL planning: Does Patient Have a Medical Advance Directive?: Yes Type of Advance Directive: Healthcare Power of Attorney, Living will Does patient want to make changes to medical advance directive?: No - Patient declined Copy of [place] in Chart?: No - copy requested   Review of Systems  Constitutional:  Negative for malaise/fatigue and weight loss.  HENT:  Negative for hearing loss and tinnitus.   Eyes:  Negative for blurred vision and double vision.  Respiratory:  Negative for cough, sputum production, shortness of breath and wheezing.   Cardiovascular:  Negative for chest pain, palpitations, orthopnea, claudication, leg swelling and PND.  Gastrointestinal:  Negative for abdominal pain, blood in stool, constipation, diarrhea, heartburn, melena, nausea and vomiting.  Genitourinary: Negative.   Musculoskeletal:  Positive for joint pain (R knee, postponing surgery ). Negative for back pain, falls and myalgias.  Skin:   Negative for rash.  Neurological:  Negative for dizziness, tingling, sensory change, weakness and headaches.  Endo/Heme/Allergies:  Negative for polydipsia.  Psychiatric/Behavioral: Negative.  Negative for depression, memory loss, substance abuse and suicidal ideas. The patient is not nervous/anxious and does not have insomnia.   All other systems reviewed and are negative.   Objective:   Blood pressure 136/88, pulse 70, temperature (!) 97.2 F (36.2 C), weight 175 lb (79.4 kg), SpO2 98 %. Body mass index is 31 kg/m.  General appearance: alert, no distress, WD/WN,  female HEENT: normocephalic, sclerae anicteric, R canal obstructed by hard wax, L clear, L TM pearly, nares patent, no discharge or erythema, pharynx normal Oral cavity: MMM, no lesions Neck: supple, no lymphadenopathy, no thyromegaly, no masses Heart: RRR, normal S1, S2, no murmurs Lungs: CTA bilaterally, no wheezes, rhonchi, or rales Abdomen: +bs, soft, non tender, non distended, no masses, no hepatomegaly, no splenomegaly Musculoskeletal: nontender, no swelling, no obvious deformity,  Extremities: no edema, no cyanosis, no clubbing Pulses: 2+ symmetric, upper and lower extremities, normal cap refill Neurological: alert, oriented x 3, CN2-12 intact, strength normal upper extremities and lower extremities, sensation normal throughout, DTRs 2+ throughout, no cerebellar signs, gait antalgic Psychiatric: normal affect, behavior normal, pleasant    Medicare Attestation I have personally reviewed: The patient's medical and social history Their use of alcohol, tobacco or illicit drugs Their current medications and supplements The patient's functional ability including ADLs,fall risks, home safety risks, cognitive, and hearing and visual impairment Diet and physical activities Evidence for depression or mood disorders  The patient's weight, height, BMI, and visual acuity have been recorded in the chart.  I have made  referrals, counseling, and provided education to the patient based on review of the above and I have provided the patient with a written personalized care plan for preventive services.     Izora Ribas, NP   08/04/2021

## 2021-08-05 LAB — CBC WITH DIFFERENTIAL/PLATELET
Absolute Monocytes: 564 cells/uL (ref 200–950)
Basophils Absolute: 81 cells/uL (ref 0–200)
Basophils Relative: 1.3 %
Eosinophils Absolute: 217 cells/uL (ref 15–500)
Eosinophils Relative: 3.5 %
HCT: 43.9 % (ref 35.0–45.0)
Hemoglobin: 14.6 g/dL (ref 11.7–15.5)
Lymphs Abs: 2399 cells/uL (ref 850–3900)
MCH: 32 pg (ref 27.0–33.0)
MCHC: 33.3 g/dL (ref 32.0–36.0)
MCV: 96.3 fL (ref 80.0–100.0)
MPV: 10.8 fL (ref 7.5–12.5)
Monocytes Relative: 9.1 %
Neutro Abs: 2939 cells/uL (ref 1500–7800)
Neutrophils Relative %: 47.4 %
Platelets: 263 10*3/uL (ref 140–400)
RBC: 4.56 10*6/uL (ref 3.80–5.10)
RDW: 14.5 % (ref 11.0–15.0)
Total Lymphocyte: 38.7 %
WBC: 6.2 10*3/uL (ref 3.8–10.8)

## 2021-08-05 LAB — COMPLETE METABOLIC PANEL WITH GFR
AG Ratio: 1.4 (calc) (ref 1.0–2.5)
ALT: 13 U/L (ref 6–29)
AST: 18 U/L (ref 10–35)
Albumin: 4.2 g/dL (ref 3.6–5.1)
Alkaline phosphatase (APISO): 63 U/L (ref 37–153)
BUN: 21 mg/dL (ref 7–25)
CO2: 29 mmol/L (ref 20–32)
Calcium: 9.8 mg/dL (ref 8.6–10.4)
Chloride: 102 mmol/L (ref 98–110)
Creat: 0.9 mg/dL (ref 0.60–0.95)
Globulin: 3 g/dL (calc) (ref 1.9–3.7)
Glucose, Bld: 85 mg/dL (ref 65–99)
Potassium: 4.2 mmol/L (ref 3.5–5.3)
Sodium: 139 mmol/L (ref 135–146)
Total Bilirubin: 0.8 mg/dL (ref 0.2–1.2)
Total Protein: 7.2 g/dL (ref 6.1–8.1)
eGFR: 64 mL/min/{1.73_m2} (ref >=60)

## 2021-08-05 LAB — LIPID PANEL
Cholesterol: 136 mg/dL (ref <200)
HDL: 52 mg/dL (ref >=50)
LDL Cholesterol (Calc): 66 mg/dL (calc)
Non-HDL Cholesterol (Calc): 84 mg/dL (calc) (ref <130)
Total CHOL/HDL Ratio: 2.6 (calc) (ref <5.0)
Triglycerides: 95 mg/dL (ref <150)

## 2021-08-05 LAB — MAGNESIUM: Magnesium: 2 mg/dL (ref 1.5–2.5)

## 2021-08-05 LAB — TSH: TSH: 1.38 mIU/L (ref 0.40–4.50)

## 2021-08-05 NOTE — Progress Notes (Signed)
Spoke to the pt, she is aware of results and recommendations. Did not have any questions for the provider or the nurse

## 2021-08-05 NOTE — Progress Notes (Signed)
Lvm for pt to call back with results

## 2021-09-20 ENCOUNTER — Ambulatory Visit: Payer: Medicare PPO | Admitting: Hematology and Oncology

## 2021-09-20 ENCOUNTER — Other Ambulatory Visit: Payer: Medicare PPO

## 2021-09-21 NOTE — Telephone Encounter (Signed)
Last Prolia inj 07/20/21 Next Prolia inj due 01/19/22

## 2021-09-22 DIAGNOSIS — M810 Age-related osteoporosis without current pathological fracture: Secondary | ICD-10-CM | POA: Diagnosis not present

## 2021-09-22 DIAGNOSIS — M85852 Other specified disorders of bone density and structure, left thigh: Secondary | ICD-10-CM | POA: Diagnosis not present

## 2021-09-22 DIAGNOSIS — Z1231 Encounter for screening mammogram for malignant neoplasm of breast: Secondary | ICD-10-CM | POA: Diagnosis not present

## 2021-09-22 LAB — HM MAMMOGRAPHY

## 2021-09-22 LAB — HM DEXA SCAN

## 2021-09-26 ENCOUNTER — Encounter: Payer: Self-pay | Admitting: Internal Medicine

## 2021-09-26 DIAGNOSIS — H25013 Cortical age-related cataract, bilateral: Secondary | ICD-10-CM | POA: Diagnosis not present

## 2021-09-26 DIAGNOSIS — H47321 Drusen of optic disc, right eye: Secondary | ICD-10-CM | POA: Diagnosis not present

## 2021-09-26 DIAGNOSIS — H25041 Posterior subcapsular polar age-related cataract, right eye: Secondary | ICD-10-CM | POA: Diagnosis not present

## 2021-09-26 DIAGNOSIS — H524 Presbyopia: Secondary | ICD-10-CM | POA: Diagnosis not present

## 2021-09-26 DIAGNOSIS — H2513 Age-related nuclear cataract, bilateral: Secondary | ICD-10-CM | POA: Diagnosis not present

## 2021-09-27 ENCOUNTER — Encounter: Payer: Self-pay | Admitting: Internal Medicine

## 2021-09-27 ENCOUNTER — Telehealth: Payer: Self-pay

## 2021-09-27 NOTE — Telephone Encounter (Signed)
Called and lvm for pt to call back to discuss results. Can you please call pt.:  Her bone density report from 09/22/2021 is back and it shows improvement compared to the BMD scores from 2020 as follows:  -Left Forearm: Improved from -3.5 to -2.5 (12% increase)  -Right hip: Improved from -3.4 to -3.2  -Left hip: Stable, at -2.2  Overall, these are good results-Let's continue Prolia.  Ty!  C

## 2021-10-03 ENCOUNTER — Encounter: Payer: Self-pay | Admitting: Hematology and Oncology

## 2021-10-03 ENCOUNTER — Inpatient Hospital Stay: Payer: Medicare PPO | Admitting: Hematology and Oncology

## 2021-10-03 ENCOUNTER — Other Ambulatory Visit: Payer: Self-pay

## 2021-10-03 ENCOUNTER — Inpatient Hospital Stay: Payer: Medicare PPO | Attending: Hematology and Oncology

## 2021-10-03 DIAGNOSIS — I10 Essential (primary) hypertension: Secondary | ICD-10-CM | POA: Diagnosis not present

## 2021-10-03 DIAGNOSIS — Z79899 Other long term (current) drug therapy: Secondary | ICD-10-CM | POA: Diagnosis not present

## 2021-10-03 DIAGNOSIS — Z7901 Long term (current) use of anticoagulants: Secondary | ICD-10-CM | POA: Insufficient documentation

## 2021-10-03 DIAGNOSIS — D471 Chronic myeloproliferative disease: Secondary | ICD-10-CM | POA: Diagnosis not present

## 2021-10-03 DIAGNOSIS — Z86718 Personal history of other venous thrombosis and embolism: Secondary | ICD-10-CM

## 2021-10-03 DIAGNOSIS — K250 Acute gastric ulcer with hemorrhage: Secondary | ICD-10-CM

## 2021-10-03 DIAGNOSIS — D751 Secondary polycythemia: Secondary | ICD-10-CM | POA: Insufficient documentation

## 2021-10-03 DIAGNOSIS — I82402 Acute embolism and thrombosis of unspecified deep veins of left lower extremity: Secondary | ICD-10-CM

## 2021-10-03 LAB — CBC WITH DIFFERENTIAL/PLATELET
Abs Immature Granulocytes: 0.02 10*3/uL (ref 0.00–0.07)
Basophils Absolute: 0.1 10*3/uL (ref 0.0–0.1)
Basophils Relative: 2 %
Eosinophils Absolute: 0.2 10*3/uL (ref 0.0–0.5)
Eosinophils Relative: 3 %
HCT: 44.1 % (ref 36.0–46.0)
Hemoglobin: 14.5 g/dL (ref 12.0–15.0)
Immature Granulocytes: 0 %
Lymphocytes Relative: 29 %
Lymphs Abs: 1.6 10*3/uL (ref 0.7–4.0)
MCH: 32.7 pg (ref 26.0–34.0)
MCHC: 32.9 g/dL (ref 30.0–36.0)
MCV: 99.5 fL (ref 80.0–100.0)
Monocytes Absolute: 0.5 10*3/uL (ref 0.1–1.0)
Monocytes Relative: 10 %
Neutro Abs: 3.1 10*3/uL (ref 1.7–7.7)
Neutrophils Relative %: 56 %
Platelets: 253 10*3/uL (ref 150–400)
RBC: 4.43 MIL/uL (ref 3.87–5.11)
RDW: 14.9 % (ref 11.5–15.5)
WBC: 5.5 10*3/uL (ref 4.0–10.5)
nRBC: 0 % (ref 0.0–0.2)

## 2021-10-03 NOTE — Assessment & Plan Note (Signed)
Her elevated blood pressure today is likely due to anxiety Monitor carefully 

## 2021-10-03 NOTE — Assessment & Plan Note (Signed)
She is compliant taking medication as directed ?Her CBC is normal ?There is no contraindication for her to proceed with cataract surgery ?I will see her in 3 months for further follow-up ?

## 2021-10-03 NOTE — Assessment & Plan Note (Signed)
She denies bleeding complication from Eliquis ?There is no contraindication for her to proceed with cataract surgery ?

## 2021-10-03 NOTE — Progress Notes (Signed)
[place] ?OFFICE PROGRESS NOTE ? ?Patient Care Team: ?Unk Pinto, MD as PCP - General (Internal Medicine) ?Monna Fam, MD as Consulting Physician (Ophthalmology) ?Gatha Mayer, MD as Consulting Physician (Gastroenterology) ?Kathie Rhodes, MD (Inactive) as Consulting Physician (Urology) ?Cheri Fowler, MD as Consulting Physician (Obstetrics and Gynecology) ? 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 ?History of recurrent deep vein thrombosis (DVT) ?She denies bleeding complication from Eliquis ?There is no contraindication for her to proceed with cataract surgery ? ?Essential hypertension ?Her elevated blood pressure today is likely due to anxiety ?Monitor carefully ? ?No orders of the defined types were placed in this encounter. ? ? ?All questions were answered. The patient knows to call the clinic with any problems, questions or concerns. ?The total time spent in the appointment was 20 minutes encounter with patients including review of chart and various tests results, discussions about plan of care and coordination of care plan ?  ?[name], MD ?10/03/2021 12:10 PM ? ?INTERVAL HISTORY: ?Please see below for problem oriented charting. ?she returns for treatment follow-up on hydroxyurea for myeloproliferative disorder ?She is also on Eliquis due to history of recurrent DVT ?She is doing well ?She is compliant taking medications as directed ?She is scheduled for cataract surgery next week ?No recent bleeding or infection ? ?REVIEW OF SYSTEMS:   ?Constitutional: Denies fevers, chills or abnormal weight loss ?Eyes: Denies blurriness of vision ?Ears, nose, mouth, throat, and face: Denies mucositis or sore throat ?Respiratory: Denies cough, dyspnea or wheezes ?Cardiovascular: Denies  palpitation, chest discomfort or lower extremity swelling ?Gastrointestinal:  Denies nausea, heartburn or change in bowel habits ?Skin: Denies abnormal skin rashes ?Lymphatics: Denies new lymphadenopathy or easy bruising ?Neurological:Denies numbness, tingling or new weaknesses ?Behavioral/Psych: Mood is stable, no new changes  ?All other systems were reviewed with the patient and are negative. ? ?I have reviewed the past medical history, past surgical history, social history and family history with the patient and they are unchanged from previous note. ? ?ALLERGIES:  is allergic to evista [raloxifene] and fosamax [alendronate sodium]. ? ?MEDICATIONS:  ?Current Outpatient Medications  ?Medication Sig Dispense Refill  ? apixaban (ELIQUIS) 5 MG TABS tablet Take 1 tablet (5 mg total) by mouth 2 (two) times daily. 180 tablet 3  ? Ascorbic Acid (VITAMIN C PO) Take 1 tablet by mouth daily.    ? calcium carbonate (OS-CAL) 600 MG TABS tablet Take 600 mg by mouth daily with breakfast.     ? Cholecalciferol (DIALYVITE VITAMIN D 5000) 125 MCG (5000 UT) capsule Take 5,000 Units by mouth daily. Take Monday thru Saturday only.    ? hydroxyurea (HYDREA) 500 MG capsule Take 1 capsule daily except on Mondays, Wednesdays, Fridays and Saturdays, take 2 capsules 90 capsule 3  ? MAGNESIUM PO Take 500 mg by mouth daily.    ? metoprolol succinate (TOPROL-XL) 100 MG 24 hr tablet Take  1 tablet  Daily for BP                                                      /  TAKE 1 TABLET BY MOUTH 90 tablet 3  ? polyvinyl alcohol (LIQUIFILM TEARS) 1.4 % ophthalmic solution Place 1 drop into both eyes as needed for dry eyes.    ? sodium chloride (OCEAN) 0.65 % SOLN nasal spray Place 1 spray into both nostrils as needed for congestion.    ? sucralfate (CARAFATE) 1 g tablet Take 1 tablet (1 g total) by mouth 4 (four) times daily -  with meals and at bedtime. 120 tablet 0  ? vitamin C (ASCORBIC ACID) 500 MG tablet Take 500 mg by mouth  daily. With meal    ? ?No current facility-administered medications for this visit.  ? ? ?SUMMARY OF ONCOLOGIC HISTORY: ?Oncology History  ? No history exists.  ? ? ?PHYSICAL EXAMINATION: ?ECOG PERFORMANCE STATUS: 0 - Asymptomatic ? ?Vitals:  ? 10/03/21 1040  ?BP: (!) 165/90  ?Pulse: 66  ?Resp: 18  ?Temp: 98 ?F (36.7 ?C)  ?SpO2: 97%  ? ?Filed Weights  ? 10/03/21 1040  ?Weight: 174 lb (78.9 kg)  ? ? ?GENERAL:alert, no distress and comfortable ?NEURO: alert & oriented x 3 with fluent speech, no focal motor/sensory deficits ? ?LABORATORY DATA:  ?I have reviewed the data as listed ?   ?Component Value Date/Time  ? NA 139 08/04/2021 1536  ? K 4.2 08/04/2021 1536  ? CL 102 08/04/2021 1536  ? CO2 29 08/04/2021 1536  ? GLUCOSE 85 08/04/2021 1536  ? BUN 21 08/04/2021 1536  ? CREATININE 0.90 08/04/2021 1536  ? CALCIUM 9.8 08/04/2021 1536  ? PROT 7.2 08/04/2021 1536  ? ALBUMIN 4.0 12/09/2020 1219  ? AST 18 08/04/2021 1536  ? ALT 13 08/04/2021 1536  ? ALKPHOS 70 12/09/2020 1219  ? BILITOT 0.8 08/04/2021 1536  ? GFRNONAA 56 (L) 12/09/2020 1219  ? GFRNONAA 61 08/04/2020 1549  ? GFRAA 71 08/04/2020 1549  ? ? ?No results found for: SPEP, UPEP ? ?Lab Results  ?Component Value Date  ? WBC 5.5 10/03/2021  ? NEUTROABS 3.1 10/03/2021  ? HGB 14.5 10/03/2021  ? HCT 44.1 10/03/2021  ? MCV 99.5 10/03/2021  ? PLT 253 10/03/2021  ? ? ?  Chemistry   ?   ?Component Value Date/Time  ? NA 139 08/04/2021 1536  ? K 4.2 08/04/2021 1536  ? CL 102 08/04/2021 1536  ? CO2 29 08/04/2021 1536  ? BUN 21 08/04/2021 1536  ? CREATININE 0.90 08/04/2021 1536  ?    ?Component Value Date/Time  ? CALCIUM 9.8 08/04/2021 1536  ? ALKPHOS 70 12/09/2020 1219  ? AST 18 08/04/2021 1536  ? ALT 13 08/04/2021 1536  ? BILITOT 0.8 08/04/2021 1536  ?  ? ?

## 2021-10-11 DIAGNOSIS — H2512 Age-related nuclear cataract, left eye: Secondary | ICD-10-CM | POA: Diagnosis not present

## 2021-10-11 DIAGNOSIS — H25812 Combined forms of age-related cataract, left eye: Secondary | ICD-10-CM | POA: Diagnosis not present

## 2021-11-02 DIAGNOSIS — H25041 Posterior subcapsular polar age-related cataract, right eye: Secondary | ICD-10-CM | POA: Diagnosis not present

## 2021-11-02 DIAGNOSIS — H25011 Cortical age-related cataract, right eye: Secondary | ICD-10-CM | POA: Diagnosis not present

## 2021-11-02 DIAGNOSIS — H2511 Age-related nuclear cataract, right eye: Secondary | ICD-10-CM | POA: Diagnosis not present

## 2021-11-03 ENCOUNTER — Ambulatory Visit: Payer: Medicare PPO | Admitting: Internal Medicine

## 2021-11-07 ENCOUNTER — Ambulatory Visit: Payer: Medicare PPO | Admitting: Internal Medicine

## 2021-11-07 ENCOUNTER — Ambulatory Visit: Payer: Medicare PPO | Admitting: Nurse Practitioner

## 2021-11-07 ENCOUNTER — Encounter: Payer: Self-pay | Admitting: Nurse Practitioner

## 2021-11-07 VITALS — BP 158/90 | HR 70 | Temp 97.9°F | Resp 16 | Ht 63.0 in | Wt 177.0 lb

## 2021-11-07 DIAGNOSIS — E119 Type 2 diabetes mellitus without complications: Secondary | ICD-10-CM | POA: Diagnosis not present

## 2021-11-07 DIAGNOSIS — M810 Age-related osteoporosis without current pathological fracture: Secondary | ICD-10-CM

## 2021-11-07 DIAGNOSIS — E669 Obesity, unspecified: Secondary | ICD-10-CM | POA: Diagnosis not present

## 2021-11-07 DIAGNOSIS — M1711 Unilateral primary osteoarthritis, right knee: Secondary | ICD-10-CM | POA: Diagnosis not present

## 2021-11-07 DIAGNOSIS — I10 Essential (primary) hypertension: Secondary | ICD-10-CM

## 2021-11-07 NOTE — Progress Notes (Signed)
?FOLLOW UP ? ?Assessment and Plan:  ? ?Hypertension/Obesity ?Elevated. ?She would like to try dietary modifications before adding another medication. ?Monitor blood pressure at home;  BP Log. ?Continue Metoprolol. ?Patient to call if consistently greater than 130/80 ?Continue DASH diet.   ?Reminder to go to the ER if any CP, SOB, nausea, dizziness, severe HA, changes vision/speech, left arm numbness and tingling and jaw pain. ?Will reassess in 1 mo ? ?-CMP ? ?Diabetes without complications ?A1C at goal. ?Controlled with diet and exercise. ?Perform daily foot/skin check, notify office of any concerning changes.  ? 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 ?Reviewed DEXA scan.   ?Improved with Prolia  ?Continue Ca and Vit D ?Continue to f/u with Dr. Alvy Bimler. ? ? ?Continue diet and meds as discussed. Further disposition pending results of labs. Discussed med's effects and SE's.   ?Over 30 minutes of exam, counseling, chart review, and critical decision making was performed.  ? ?Future Appointments  ?Date Time Provider [place]  ?11/10/2021 11:00 AM Philemon Kingdom, MD [facility] None  ?01/03/2022 11:30 AM [facility] None  ?01/03/2022 12:00 PM [name], MD [facility] None  ?03/09/2022 11:00 AM Unk Pinto, MD [facility] None  ?08/04/2022 11:00 AM Liane Comber, NP [facility] None  ? ? ?---------------------------------------------------------------------------------------------------------------------- ? ?HPI ?81 y.o. female  presents for 3 month follow up on hypertension, cholesterol, diabetes, weight and vitamin D deficiency.  ? ?BMI is Body mass index is 31.35 kg/m?., she has been working on diet and exercise.  She has gained 3 lb over the last month.  ?Wt Readings from Last 3 Encounters:  ?11/07/21 177 lb (80.3 kg)  ?10/03/21 174 lb (78.9 kg)  ?08/04/21 175 lb (79.4 kg)  ? ? ?Her blood pressure has been controlled at home, today their BP is BP: (!) 158/90.   She takes Metoprolol ? She does not workout. She denies chest pain, shortness of breath, dizziness. ? ? She is not on cholesterol medication.  Controlled by diet. Her cholesterol is at goal. The cholesterol last visit was:   ?Lab Results  ?Component Value Date  ? CHOL 136 08/04/2021  ? HDL 52 08/04/2021  ? [place] 66 08/04/2021  ? TRIG 95 08/04/2021  ? CHOLHDL 2.6 08/04/2021  ? ? She has been working on diet and exercise for prediabetes, and denies increased appetite, nausea, polydipsia, and polyuria. Last A1C in the office was:  ?Lab Results  ?Component Value Date  ? HGBA1C 5.5 03/09/2021  ? ?Patient is on Vitamin D supplement.   ?Lab Results  ?Component Value Date  ? VD25OH 77 03/09/2021  ?   ? ? ? ?Current Medications:  ?Current Outpatient Medications on File Prior to Visit  ?Medication Sig  ? apixaban (ELIQUIS) 5 MG TABS tablet Take 1 tablet (5 mg total) by mouth 2 (two) times daily.  ? Ascorbic Acid (VITAMIN C PO) Take 1 tablet by mouth daily.  ? calcium carbonate (OS-CAL) 600 MG TABS tablet Take 600 mg by mouth daily with breakfast.   ? Cholecalciferol (DIALYVITE VITAMIN D 5000) 125 MCG (5000 UT) capsule Take 5,000 Units by mouth daily. Take Monday thru Saturday only.  ? hydroxyurea (HYDREA) 500 MG capsule Take 1 capsule daily except on Mondays, Wednesdays, Fridays and Saturdays, take 2 capsules  ? MAGNESIUM PO Take 500 mg by mouth daily.  ? metoprolol succinate (TOPROL-XL) 100 MG 24 hr tablet Take  1 tablet  Daily for BP                                                      /  TAKE 1 TABLET BY MOUTH  ? polyvinyl alcohol (LIQUIFILM TEARS) 1.4 % ophthalmic solution Place 1 drop into both eyes as needed for dry eyes.  ? sodium chloride (OCEAN) 0.65 % SOLN nasal spray Place 1 spray into both nostrils as needed for congestion.  ? vitamin C (ASCORBIC ACID) 500 MG tablet Take 500 mg by mouth daily. With meal  ? sucralfate (CARAFATE) 1 g tablet Take 1 tablet (1 g total) by mouth 4 (four) times daily -   with meals and at bedtime.  ? ?No current facility-administered medications on file prior to visit.  ? ? ? ?Allergies:  ?Allergies  ?Allergen Reactions  ? Evista [Raloxifene] Other (See Comments)  ?  Blood clots - DVT and Pulmonary Embolism   ? Fosamax [Alendronate Sodium]   ?  esophagitis  ?  ? ?Medical History:  ?Past Medical History:  ?Diagnosis Date  ? Acute gastric ulcer with hemorrhage   ? AKI (acute kidney injury) ([place]) 08/19/2015  ? Anemia due to gastrointestinal blood loss 11/03/2020  ? Cataract   ? Clotting disorder ([place])   ? DVT of lower extremity (deep venous thrombosis) ([place])   ? left, resolved  ? Elevated hemoglobin A1c   ? GERD (gastroesophageal reflux disease)   ? GI bleed due to NSAIDs   ? H/O left knee surgery   ? Hypertension   ? JAK2 gene mutation   ? Leiomyoma   ? adnexal Leiomyoma removal in 1/11  ? 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 ? P vera  ? Nephrolithiasis   ? Osteoporosis   ? PE (pulmonary embolism) 08/2015  ? 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 ?Family history- Reviewed and unchanged ?Social history- Reviewed and unchanged ? ? ?Review of Systems:  ?Review of Systems  ?Constitutional: Negative.   ?HENT: Negative.    ?Eyes: Negative.   ?Respiratory: Negative.    ?Cardiovascular: Negative.   ?Gastrointestinal: Negative.   ?Genitourinary: Negative.   ?Musculoskeletal: Negative.   ?Skin: Negative.   ?Neurological: Negative.   ?Endo/Heme/Allergies: Negative.   ?Psychiatric/Behavioral: Negative.    ? ?Physical Exam: ?BP (!) 158/90   Pulse 70   Temp 97.9 ?F (36.6 ?C)   Resp 16   Ht '5\' 3"'$  (1.6 m)   Wt 177 lb (80.3 kg)   SpO2 97%   BMI 31.35 kg/m?  ?Wt Readings from Last 3 Encounters:  ?11/07/21 177 lb (80.3 kg)  ?10/03/21 174 lb (78.9 kg)  ?08/04/21 175 lb (79.4 kg)  ? ?General Appearance: Well nourished, in no apparent distress. ?Eyes: PERRLA, EOMs, conjunctiva no swelling or erythema ?Sinuses: No Frontal/maxillary tenderness ?ENT/Mouth: Ext aud canals clear,  TMs without erythema, bulging. No erythema, swelling, or exudate on post pharynx.  Tonsils not swollen or erythematous. Hearing normal.  ?Neck: Supple, thyroid normal.  ?Respiratory: Respiratory effort normal, BS equal bilaterally without rales, rhonchi, wheezing or stridor.  ?Cardio: RRR with no MRGs. Brisk peripheral pulses without edema.  ?Abdomen: Soft, + BS.  Non tender, no guarding, rebound, hernias, masses. ?Lymphatics: Non tender without lymphadenopathy.  ?Musculoskeletal: Full ROM, 5/5 strength, Normal gait ?Skin: Warm, dry without rashes, lesions, ecchymosis.  ?Neuro: Cranial nerves intact. No cerebellar symptoms.  ?Psych: Awake and oriented X 3, normal affect, Insight and Judgment appropriate.  ? ? ?Darrol Jump, NP ?3:53 PM ?[facility] ? ?

## 2021-11-08 LAB — COMPLETE METABOLIC PANEL WITH GFR
AG Ratio: 1.4 (calc) (ref 1.0–2.5)
ALT: 16 U/L (ref 6–29)
AST: 21 U/L (ref 10–35)
Albumin: 4.6 g/dL (ref 3.6–5.1)
Alkaline phosphatase (APISO): 53 U/L (ref 37–153)
BUN: 17 mg/dL (ref 7–25)
CO2: 27 mmol/L (ref 20–32)
Calcium: 10.3 mg/dL (ref 8.6–10.4)
Chloride: 101 mmol/L (ref 98–110)
Creat: 0.79 mg/dL (ref 0.60–0.95)
Globulin: 3.3 g/dL (calc) (ref 1.9–3.7)
Glucose, Bld: 90 mg/dL (ref 65–99)
Potassium: 4.2 mmol/L (ref 3.5–5.3)
Sodium: 139 mmol/L (ref 135–146)
Total Bilirubin: 0.9 mg/dL (ref 0.2–1.2)
Total Protein: 7.9 g/dL (ref 6.1–8.1)
eGFR: 75 mL/min/{1.73_m2} (ref >=60)

## 2021-11-09 ENCOUNTER — Ambulatory Visit: Payer: Medicare PPO | Admitting: Internal Medicine

## 2021-11-10 ENCOUNTER — Ambulatory Visit: Payer: Medicare PPO | Admitting: Internal Medicine

## 2021-11-10 ENCOUNTER — Encounter: Payer: Self-pay | Admitting: Internal Medicine

## 2021-11-10 VITALS — BP 118/72 | HR 74 | Ht 63.6 in | Wt 177.6 lb

## 2021-11-10 DIAGNOSIS — M810 Age-related osteoporosis without current pathological fracture: Secondary | ICD-10-CM | POA: Diagnosis not present

## 2021-11-10 DIAGNOSIS — E673 Hypervitaminosis D: Secondary | ICD-10-CM | POA: Diagnosis not present

## 2021-11-10 LAB — VITAMIN D 25 HYDROXY (VIT D DEFICIENCY, FRACTURES): VITD: 64.33 ng/mL (ref 30.00–100.00)

## 2021-11-10 NOTE — Progress Notes (Signed)
Patient ID: Kimberly Watson, female   DOB: [birth year], 81 y.o.   MRN: [id number]  ? ?This visit occurred during the SARS-CoV-2 public health emergency.  Safety protocols were in place, including screening questions prior to the visit, additional usage of staff PPE, and extensive cleaning of exam room while observing appropriate contact time as indicated for disinfecting solutions.  ? ?HPI  ?Kimberly Watson is a 81 y.o.-year-old female, initially referred by her PCP, Dr. Melford Aase, returning for follow-up for osteoporosis (OP).  Last visit 1 year ago. ? ?Interim history: ?She had no falls or fractures since last OV.  She continues to walk with a cane.  She has knee pain. ?No dizziness/vertigo/orthostasis/poor vision.   ?She has myeloproliferative ds - dx'ed in 2017, worse last summer >> improved with hydration and 2 phlebotomies. ? ?Reviewed and addended hx: ?Pt was dx with OP in 1990s -in her late 46s. ? ?I reviewed pt's DXA scan reports: ?Date L1-L4 T score FN T score 33% distal Radius Ultra distal radius  ?09/22/2021 ([facility]) N/a RFN: -3.2 ?LFN: -2.2 -2.5 (+12%!) N/a  ?07/03/2019 ([facility]) n/a RFN: -3.4 ?LFN: -2.2 -3.5 n/a  ?05/16/2017 ([facility]) n/a RFN: -2.9 ?LFN: -2.0 -3.1 n/a  ?04/09/2013 ([facility]) n/a RFN: -2.2 ?LFN: -1.8 n/a n/a  ? ?Date L1-L4 T score FN T score 33% distal Radius Ultra distal radius  ?04/27/2015 ([facility] [facility] [employer] [facility]) n/a RFN: -2.0 ?LFN: -2.2 -2.1 -4.4  ? ?She fell 10/2018 >> no fractures. ? ?She needs another knee replacement (R). She had steroid 1x inj. in 2019 in R knee. ? 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 ?No h/o vitamin D deficiency. Reviewed available vit D levels: ?Lab Results  ?Component Value Date  ?  VD25OH 77 03/09/2021  ? VD25OH 95.6 12/29/2020  ? VD25OH 109.0 (H) 10/28/2020  ? VD25OH 84.2 12/31/2019  ? VD25OH 90 09/17/2019  ? VD25OH 78 01/27/2019  ? VD25OH 94 11/14/2017  ? VD25OH 46 12/22/2016  ? VD25OH 81 01/19/2016  ? VD25OH 46 07/13/2015  ? ?Pt is on: ?- calcium carbonate 600 mg 2x a day >> 1x a day  ?- vitamin D 15,000 daily (5000 units 3x a day) >> 10,000 units daily >> 5000 units daily >> 5000 units 6/7 days ? ?She was exercising at the gym before the Covid pandemic.  She was previously exercising along with her sister: Pushups, marching in place, touching toes, handweights.  She has not been exercising much lately due to knee pain. ? ?She does not take high vitamin A doses. ? ?Menopause was at 81 y/o.  ? ?FH of osteoporosis: father in his 15s, sisters with osteopenia. ? ?No h/o hyper/hypocalcemia or hyperparathyroidism. No h/o kidney stones. ?Lab Results  ?Component Value Date  ? CALCIUM 10.3 11/07/2021  ? CALCIUM 9.8 08/04/2021  ? CALCIUM 9.7 03/09/2021  ? CALCIUM 9.9 12/09/2020  ? CALCIUM 9.3 11/25/2020  ? CALCIUM 9.1 11/20/2020  ? CALCIUM 9.9 11/19/2020  ? CALCIUM 9.9 08/04/2020  ? CALCIUM 9.6 07/01/2020  ? CALCIUM 9.9 03/03/2020  ? ?No h/o thyrotoxicosis. Reviewed TSH recent levels:  ?Lab Results  ?Component Value Date  ? TSH 1.38 08/04/2021  ? TSH 1.23 03/09/2021  ? TSH 1.25  08/04/2020  ? TSH 1.51 03/03/2020  ? TSH 1.16 09/17/2019  ? ?No h/o CKD. Last BUN/Cr: ?Lab Results  ?Component Value Date  ? BUN 17 11/07/2021  ? CREATININE 0.79 11/07/2021  ? ?She has esophageal strictures (had Es stretching in the past) and hiatal hernia. ? ?ROS: ?+ see HPI ?+ hypoacusis ?+ joint aches ? ?I reviewed pt's medications, allergies, PMH, social hx, family hx, and changes were documented in the history of present illness. Otherwise, unchanged from my initial visit note. ? ?Past Medical History:  ?Diagnosis Date  ? Acute gastric ulcer with hemorrhage   ? AKI (acute kidney injury) ([place]) 08/19/2015  ? Anemia due to  gastrointestinal blood loss 11/03/2020  ? Cataract   ? Clotting disorder ([place])   ? DVT of lower extremity (deep venous thrombosis) ([place])   ? left, resolved  ? Elevated hemoglobin A1c   ? GERD (gastroesophageal reflux disease)   ? GI bleed due to NSAIDs   ? H/O left knee surgery   ? Hypertension   ? JAK2 gene mutation   ? Leiomyoma   ? adnexal Leiomyoma removal in 1/11  ? 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 ? P vera  ? Nephrolithiasis   ? Osteoporosis   ? PE (pulmonary embolism) 08/2015  ? 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 ?Past Surgical History:  ?Procedure Laterality Date  ? CHOLECYSTECTOMY    ? cyst removed off spine    ? ESOPHAGOGASTRODUODENOSCOPY (EGD) WITH PROPOFOL N/A 11/20/2020  ? Procedure: ESOPHAGOGASTRODUODENOSCOPY (EGD) WITH PROPOFOL;  Surgeon: Mauri Pole, MD;  Location: [facility];  Service: Endoscopy;  Laterality: N/A;  ? IR IVC FILTER PLMT / S&I /IMG GUID/MOD SED  11/19/2020  ? IVC FILTER PLACEMENT ([facility] HX)  08/2015  ? JOINT REPLACEMENT Left total knee  ? TONSILLECTOMY    ? UTERINE FIBROID SURGERY    ? ?Social History  ? ?Socioeconomic History  ? Marital status: Divorced  ?  Spouse name: Not on file  ? Number of children: 2  ? Years of education: Not on file  ? Highest education level: Not on file  ?Occupational History  ? Occupation: Pharmacist, hospital  ?  Comment: Retired  ?Tobacco Use  ? Smoking status: Never  ? Smokeless tobacco: Never  ?Vaping Use  ? Vaping Use: Never used  ?Substance and Sexual Activity  ? Alcohol use: No  ? Drug use: No  ? Sexual activity: Not on file  ?  Comment: retired, next of kin, 1 son and daughter.  ?Other Topics Concern  ? Not on file  ?Social History Narrative  ? Divorced, 2 children lives alone.  Lived with a smoker for years.   ? Retired Radio producer  ? No alcohol tobacco or drug use  ? 10/08/2017  ?   ? ?Social Determinants of Health  ? ?Financial Resource Strain: Not on file  ?Food Insecurity: Not on file  ?Transportation Needs: Not on  file  ?Physical Activity: Not on file  ?Stress: Not on file  ?Social Connections: Not on file  ?Intimate Partner Violence: Not on file  ? ?Current Outpatient Medications on File Prior to Visit  ?Medication Sig Dispense Refill  ? apixaban (ELIQUIS) 5 MG TABS tablet Take 1 tablet (5 mg total) by mouth 2 (two) times daily. 180 tablet 3  ? Ascorbic Acid (VITAMIN C PO) Take 1 tablet by mouth daily.    ? calcium carbonate (OS-CAL) 600 MG TABS tablet Take 600 mg by mouth daily with breakfast.     ?  Cholecalciferol (DIALYVITE VITAMIN D 5000) 125 MCG (5000 UT) capsule Take 5,000 Units by mouth daily. Take Monday thru Saturday only.    ? hydroxyurea (HYDREA) 500 MG capsule Take 1 capsule daily except on Mondays, Wednesdays, Fridays and Saturdays, take 2 capsules 90 capsule 3  ? MAGNESIUM PO Take 500 mg by mouth daily.    ? metoprolol succinate (TOPROL-XL) 100 MG 24 hr tablet Take  1 tablet  Daily for BP                                                      /                   TAKE 1 TABLET BY MOUTH 90 tablet 3  ? polyvinyl alcohol (LIQUIFILM TEARS) 1.4 % ophthalmic solution Place 1 drop into both eyes as needed for dry eyes.    ? sodium chloride (OCEAN) 0.65 % SOLN nasal spray Place 1 spray into both nostrils as needed for congestion.    ? sucralfate (CARAFATE) 1 g tablet Take 1 tablet (1 g total) by mouth 4 (four) times daily -  with meals and at bedtime. 120 tablet 0  ? vitamin C (ASCORBIC ACID) 500 MG tablet Take 500 mg by mouth daily. With meal    ? ?No current facility-administered medications on file prior to visit.  ? ?Allergies  ?Allergen Reactions  ? Evista [Raloxifene] Other (See Comments)  ?  Blood clots - DVT and Pulmonary Embolism   ? Fosamax [Alendronate Sodium]   ?  esophagitis  ? ?Family History  ?Problem Relation Age of Onset  ? Hypertension Mother   ? Diabetes Mother   ? Breast cancer Mother   ? Congestive Heart Failure Mother   ?     Died age 108  ? Osteoporosis Father   ? Colon cancer Neg Hx   ? Colon polyps  Neg Hx   ? Esophageal cancer Neg Hx   ? Rectal cancer Neg Hx   ? Stomach cancer Neg Hx   ? ?PE: ?BP 118/72 (BP Location: Left Arm, Patient Position: Sitting, Cuff Size: Normal)   Pulse 74   Ht 5' 3.6" (1.6

## 2021-11-10 NOTE — Patient Instructions (Signed)
Please continue calcium 600 mg daily and vitamin D 5,000 units 6/7 days.  ? ?Also, continue Prolia. ? ?Please stop at the lab. ? ?Please come back for a follow-up appointment in 1 year. ?

## 2021-11-15 DIAGNOSIS — H25811 Combined forms of age-related cataract, right eye: Secondary | ICD-10-CM | POA: Diagnosis not present

## 2021-11-15 DIAGNOSIS — H25011 Cortical age-related cataract, right eye: Secondary | ICD-10-CM | POA: Diagnosis not present

## 2021-11-15 DIAGNOSIS — H2511 Age-related nuclear cataract, right eye: Secondary | ICD-10-CM | POA: Diagnosis not present

## 2021-11-15 DIAGNOSIS — H25041 Posterior subcapsular polar age-related cataract, right eye: Secondary | ICD-10-CM | POA: Diagnosis not present

## 2021-12-03 DIAGNOSIS — J029 Acute pharyngitis, unspecified: Secondary | ICD-10-CM | POA: Diagnosis not present

## 2021-12-03 DIAGNOSIS — J019 Acute sinusitis, unspecified: Secondary | ICD-10-CM | POA: Diagnosis not present

## 2021-12-03 DIAGNOSIS — Z03818 Encounter for observation for suspected exposure to other biological agents ruled out: Secondary | ICD-10-CM | POA: Diagnosis not present

## 2021-12-03 DIAGNOSIS — Z20822 Contact with and (suspected) exposure to covid-19: Secondary | ICD-10-CM | POA: Diagnosis not present

## 2021-12-07 NOTE — Telephone Encounter (Signed)
Prolia VOB initiated via [email or web address] ? ?Last Prolia inj 07/20/21 ?Next Prolia inj due 01/19/22 ? ?PA expired 07/30/21 ?

## 2022-01-03 ENCOUNTER — Other Ambulatory Visit: Payer: Self-pay

## 2022-01-03 ENCOUNTER — Inpatient Hospital Stay: Payer: Medicare PPO | Attending: Hematology and Oncology

## 2022-01-03 ENCOUNTER — Encounter: Payer: Self-pay | Admitting: Hematology and Oncology

## 2022-01-03 ENCOUNTER — Inpatient Hospital Stay (HOSPITAL_BASED_OUTPATIENT_CLINIC_OR_DEPARTMENT_OTHER): Payer: Medicare PPO | Admitting: Hematology and Oncology

## 2022-01-03 DIAGNOSIS — K250 Acute gastric ulcer with hemorrhage: Secondary | ICD-10-CM

## 2022-01-03 DIAGNOSIS — I82402 Acute embolism and thrombosis of unspecified deep veins of left lower extremity: Secondary | ICD-10-CM

## 2022-01-03 DIAGNOSIS — D471 Chronic myeloproliferative disease: Secondary | ICD-10-CM

## 2022-01-03 DIAGNOSIS — Z86718 Personal history of other venous thrombosis and embolism: Secondary | ICD-10-CM | POA: Insufficient documentation

## 2022-01-03 DIAGNOSIS — Z79899 Other long term (current) drug therapy: Secondary | ICD-10-CM | POA: Insufficient documentation

## 2022-01-03 DIAGNOSIS — Z7901 Long term (current) use of anticoagulants: Secondary | ICD-10-CM | POA: Diagnosis not present

## 2022-01-03 LAB — CBC WITH DIFFERENTIAL/PLATELET
Abs Immature Granulocytes: 0.02 10*3/uL (ref 0.00–0.07)
Basophils Absolute: 0.1 10*3/uL (ref 0.0–0.1)
Basophils Relative: 1 %
Eosinophils Absolute: 0.1 10*3/uL (ref 0.0–0.5)
Eosinophils Relative: 2 %
HCT: 43.8 % (ref 36.0–46.0)
Hemoglobin: 14.5 g/dL (ref 12.0–15.0)
Immature Granulocytes: 0 %
Lymphocytes Relative: 32 %
Lymphs Abs: 1.8 10*3/uL (ref 0.7–4.0)
MCH: 34.4 pg — ABNORMAL HIGH (ref 26.0–34.0)
MCHC: 33.1 g/dL (ref 30.0–36.0)
MCV: 104 fL — ABNORMAL HIGH (ref 80.0–100.0)
Monocytes Absolute: 0.6 10*3/uL (ref 0.1–1.0)
Monocytes Relative: 11 %
Neutro Abs: 3.1 10*3/uL (ref 1.7–7.7)
Neutrophils Relative %: 54 %
Platelets: 252 10*3/uL (ref 150–400)
RBC: 4.21 MIL/uL (ref 3.87–5.11)
RDW: 14 % (ref 11.5–15.5)
WBC: 5.8 10*3/uL (ref 4.0–10.5)
nRBC: 0 % (ref 0.0–0.2)

## 2022-01-03 NOTE — Assessment & Plan Note (Signed)
She is compliant taking medication as directed Her CBC is normal I will see her in 3 months for further follow-up

## 2022-01-03 NOTE — Progress Notes (Signed)
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   All questions were answered. The patient knows to call the clinic with any problems, questions or concerns. The total time spent in the appointment was 20 minutes encounter with patients including review of chart and various tests results, discussions about plan of care and coordination of care plan   [name], MD 01/03/2022 2:56 PM  INTERVAL HISTORY: Please see below for problem oriented charting. she returns for treatment follow-up She is on hydroxyurea for myeloproliferative disorder and Eliquis for history of recurrent deep vein thrombosis She is compliant taking her medications as directed Denies recent infection No recent bleeding  REVIEW OF SYSTEMS:   Constitutional: Denies fevers, chills or abnormal weight loss Eyes: Denies blurriness of vision Ears, nose, mouth, throat, and face: Denies mucositis or sore throat Respiratory: Denies cough, dyspnea or wheezes Cardiovascular: Denies palpitation, chest discomfort or lower extremity  swelling Gastrointestinal:  Denies nausea, heartburn or change in bowel habits Skin: Denies abnormal skin rashes Lymphatics: Denies new lymphadenopathy or easy bruising Neurological:Denies numbness, tingling or new weaknesses Behavioral/Psych: Mood is stable, no new changes  All other systems were reviewed with the patient and are negative.  I have reviewed the past medical history, past surgical history, social history and family history with the patient and they are unchanged from previous note.  ALLERGIES:  is allergic to evista [raloxifene] and fosamax [alendronate sodium].  MEDICATIONS:  Current Outpatient Medications  Medication Sig Dispense Refill   apixaban (ELIQUIS) 5 MG TABS tablet Take 1 tablet (5 mg total) by mouth 2 (two) times daily. 180 tablet 3   Ascorbic Acid (VITAMIN C PO) Take 1 tablet by mouth daily.     calcium carbonate (OS-CAL) 600 MG TABS tablet Take 600 mg by mouth daily with breakfast.      Cholecalciferol (DIALYVITE VITAMIN D 5000) 125 MCG (5000 UT) capsule Take 5,000 Units by mouth daily. Take Monday thru Saturday only.     hydroxyurea (HYDREA) 500 MG capsule Take 1 capsule daily except on Mondays, Wednesdays, Fridays and Saturdays, take 2 capsules 90 capsule 3   MAGNESIUM PO Take 500 mg by mouth daily.     metoprolol succinate (TOPROL-XL) 100 MG 24 hr tablet Take  1 tablet  Daily for BP                                                      /  TAKE 1 TABLET BY MOUTH 90 tablet 3   polyvinyl alcohol (LIQUIFILM TEARS) 1.4 % ophthalmic solution Place 1 drop into both eyes as needed for dry eyes.     sodium chloride (OCEAN) 0.65 % SOLN nasal spray Place 1 spray into both nostrils as needed for congestion.     vitamin C (ASCORBIC ACID) 500 MG tablet Take 500 mg by mouth daily. With meal     No current facility-administered medications for this visit.    SUMMARY OF ONCOLOGIC HISTORY:  Kimberly Watson was referred here after recent diagnosis of  severe left lower extremity DVT with bilateral PE and cor pulmonale. I reviewed her records extensively and collaborated the history with the patient. The patient has been taking Evista for over 25 years due to strong family history of breast cancer and for osteoporosis management. In January 2017, she had viral gastroenteritis with severe diarrhea and dehydration. The patient has been taking Lasix chronically for mild bilateral lower extremity edema, left greater than the right. 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 She recalled having profuse diarrhea nonstop for 24 hours leading up to the blood clot event. Of note, prior to the diagnosis, she was not taking aspirin consistently  Prior to that she denies recent history of trauma, long distance travel, recent surgery, smoking or prolonged immobilization. She had no prior history or diagnosis of cancer. Her age appropriate screening programs are up-to-date. She had prior surgeries before and never had perioperative thromboembolic events. The patient had been exposed to birth control pills and never had thrombotic events. The patient had been pregnant before and denies history of peripartum thromboembolic event or history of recurrent miscarriages. There is no family history of blood clots or miscarriages.  On 08/18/2015, she presented to the emergency department due to symptoms of severe weakness, chest pressure, shortness of breath and near syncopal episode. She had extensive evaluation in the emergency room. D-dimer was elevated at greater than 20.  CT angiogram dated 08/19/2015 show massive saddle pulmonary embolus with evidence of severe right heart strain.  Left lower extremity venous Doppler on 08/19/2015 showed findings consistent with acute deep vein thrombosis involving the left femoral vein, left popliteal vein, left posterior tibial vein and left peroneal vein.  Echocardiogram on  08/19/2015 show preserved ejection fraction but with right ventricular dilatation, moderate tricuspid valvular regurgitation, oscillating density on TV(thought to be blood clot in transit) and moderate to severe increased right systolic pressure/evidence of severe pulmonary hypertension  The patient was admitted with IV heparin anticoagulation therapy and placement of IVC filter on 08/20/2015.  Echocardiogram was repeated on 08/23/2015 which showed persistent elevated pulmonary hypertension. The mass over the right tricuspid valve is no longer visible She was hospitalized on 08/18/2015 to 08/24/2015. She was discharged home on Eliquis.  Evista was discontinued. 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  Her anticoagulation was placed on hold recently and she developed acute DVT.  She had IVC filter placed on 11/19/2020 On 11/20/2020, she underwent repeat EGD due to GI bleed which showed - Z-line regular, 36 cm from the incisors. - No gross lesions in  esophagus. - Non-bleeding gastric ulcer with a clean ulcer base (Forrest Class III). - Normal examined duodenum. - No specimens collected. On Dec 09, 2020, she resume Hydrea On February 28, 2021, the dose of hydroxyurea is changed to 500 mg daily except on Sundays she will take 1000 mg by mouth On July 12, 2021, the dose of hydroxyurea is changed to 1000 mg 4 days a week and 500 mg the rest of the week  PHYSICAL EXAMINATION: ECOG PERFORMANCE STATUS: 0 - Asymptomatic  Vitals:   01/03/22 1151  BP: (!) 139/94  Pulse: 69  Resp: 18  Temp: 97.6 F (36.4 C)   SpO2: 92%   Filed Weights   01/03/22 1151  Weight: 180 lb 6.4 oz (81.8 kg)    GENERAL:alert, no distress and comfortable NEURO: alert & oriented x 3 with fluent speech, no focal motor/sensory deficits  LABORATORY DATA:  I have reviewed the data as listed    Component Value Date/Time   NA 139 11/07/2021 1538   K 4.2 11/07/2021 1538   CL 101 11/07/2021 1538   CO2 27 11/07/2021 1538   GLUCOSE 90 11/07/2021 1538   BUN 17 11/07/2021 1538   CREATININE 0.79 11/07/2021 1538   CALCIUM 10.3 11/07/2021 1538   PROT 7.9 11/07/2021 1538   ALBUMIN 4.0 12/09/2020 1219   AST 21 11/07/2021 1538   ALT 16 11/07/2021 1538   ALKPHOS 70 12/09/2020 1219   BILITOT 0.9 11/07/2021 1538   GFRNONAA 56 (L) 12/09/2020 1219   GFRNONAA 61 08/04/2020 1549   GFRAA 71 08/04/2020 1549    No results found for: SPEP, UPEP  Lab Results  Component Value Date   WBC 5.8 01/03/2022   NEUTROABS 3.1 01/03/2022   HGB 14.5 01/03/2022   HCT 43.8 01/03/2022   MCV 104.0 (H) 01/03/2022   PLT 252 01/03/2022      Chemistry      Component Value Date/Time   NA 139 11/07/2021 1538   K 4.2 11/07/2021 1538   CL 101 11/07/2021 1538   CO2 27 11/07/2021 1538   BUN 17 11/07/2021 1538   CREATININE 0.79 11/07/2021 1538      Component Value Date/Time   CALCIUM 10.3 11/07/2021 1538   ALKPHOS 70 12/09/2020 1219   AST 21 11/07/2021 1538   ALT 16 11/07/2021 1538   BILITOT 0.9 11/07/2021 1538

## 2022-01-03 NOTE — Assessment & Plan Note (Addendum)
She denies bleeding complication from Eliquis She will continue treatment indefinitely due to high risk of recurrent thrombosis We discussed risk of recurrent thrombosis in the setting of dehydration I reminded the patient to drink more liquids when she travels to the beach this summer

## 2022-01-26 NOTE — Telephone Encounter (Signed)
KeyArthor Captain - PA Case ID: [id number]  Valid:11/24/19-07/30/22

## 2022-01-26 NOTE — Telephone Encounter (Signed)
Pt ready for scheduling on or after 01/19/22  Out-of-pocket cost due at time of visit: $0  Primary: [employer] Prolia co-insurance: 0% Admin fee co-insurance: 0%  Secondary: n/a Prolia co-insurance:  Admin fee co-insurance:   Deductible: does not apply  Prior Auth: APPROVED Key: [id number] - PA Case ID: [id number]  Valid:11/24/19-07/30/22    ** This summary of benefits is an estimation of the patient's out-of-pocket cost. Exact cost may very based on individual plan coverage.

## 2022-02-09 ENCOUNTER — Other Ambulatory Visit: Payer: Self-pay | Admitting: Hematology and Oncology

## 2022-03-04 NOTE — Telephone Encounter (Addendum)
>   30 days PAST DUE  If you would like for pt to continue with Prolia therapy, please have clinical staff reach out to pt for scheduling and to explain to importance of receiving Prolia injections every 6 months as abrupt cessation of Prolia raises risk of osteoporotic fracture.    "Discontinuation of Dmab is associated with a 3- to 5-fold higher risk for vertebral, major osteoporotic, and hip fractures [38,39]."   [email or web address]   Pt ready for scheduling on or after 01/19/22   Out-of-pocket cost due at time of visit: $0   Primary: [employer] Prolia co-insurance: 0% Admin fee co-insurance: 0%   Secondary: n/a Prolia co-insurance:  Admin fee co-insurance:    Deductible: does not apply   Prior Auth: APPROVED Key: [id number] - PA Case ID: [id number]  Valid:11/24/19-07/30/22     ** This summary of benefits is an estimation of the patient's out-of-pocket cost. Exact cost may very based on individual plan coverage.

## 2022-03-06 NOTE — Telephone Encounter (Signed)
Can you please call pt. - per Brandy's message below.

## 2022-03-06 NOTE — Telephone Encounter (Signed)
Patient has been scheduled for 03/15/22 for Prolia

## 2022-03-08 NOTE — Patient Instructions (Signed)

## 2022-03-08 NOTE — Progress Notes (Unsigned)
Annual Screening/Preventative Visit & Comprehensive Evaluation &  Examination  Future Appointments  Date Time Provider Department  03/09/2022                      cpe 11:00 AM Unk Pinto, MD [facility]  04/06/2022 12:00 PM [name], MD [facility]  08/04/2022                        wellness 11:00 AM Darrol Jump, NP [facility]  11/13/2022 11:00 AM Philemon Kingdom, MD [facility]  03/13/2023                      cpe 11:00 AM Unk Pinto, MD [facility]         This very nice 81 y.o. DWF presents for a Screening /Preventative Visit & comprehensive evaluation and management of multiple medical co-morbidities.  Patient has been followed for HTN, HLD, Prediabetes  and Vitamin D Deficiency. Patient is followed by Dr Cruzita Lederer for Osteoporosis.                                            Patient has hx/o intolerance to Fosamax and Reclast and in 2017 had DVT & PE's consequent of Evista Tx and is followed now By Dr C. Gherghe treating her Osteoporosis with Prolia. 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          HTN predates circa  1980's. Patient's BP has been controlled at home and patient denies any cardiac symptoms as chest pain, palpitations, shortness of breath, dizziness or ankle swelling. Today's BP is  at goal - 138/84 .       Patient's hyperlipidemia is controlled with diet and medications. Patient denies myalgias or other medication SE's. Last lipids were at goal:  Lab Results  Component Value Date   CHOL 136 08/04/2021   HDL 52 08/04/2021   LDLCALC 66 08/04/2021   TRIG 95 08/04/2021   CHOLHDL 2.6 08/04/2021         Patient  is monitored expectantly for glucose intolerance and patient denies reactive hypoglycemic symptoms, visual blurring, diabetic polys or paresthesias. Last A1c was normal  & at goal:  Lab Results  Component Value Date   HGBA1C 5.5 03/09/2021         Finally, patient has  history of Vitamin D Deficiency and last Vitamin D was at goal:  Lab Results  Component Value Date   VD25OH 64 11/10/2021      Current Outpatient Medications:     apixaban (ELIQUIS) 5 MG TABS tablet, Take 1 tablet 2  times daily., Disp: 180 tablet, Rfl: 3   VITAMIN C, Take 1 tablet  daily., Disp: , Rfl:    OS-CAL 600 MG TABS , Take 600 mg  daily  , Disp: , Rfl:    VITAMIN D 5000 u, Take  daily. Take Monday thru Saturday only   hydroxyurea (HYDREA) 500 MG capsule, TAKE 1 CAPSULE DAILY EXCEPT ON MONDAYS, WEDNESDAYS, FRIDAYS AND SATURDAYS, TAKE 2 CAPSULES, Disp: 270 capsule, Rfl: 1   MAGNESIUM PO, Take 500 mg by mouth daily., Disp: , Rfl:    metoprolol succinate -XL  100 MG , Take  1 tablet  Daily     LIQUIFILM TEARS 1.4 %  ophth soln, Place 1 drop into both eyes as needed for dry eyes.   sodium chloride 0.65 % SOLN nasal spray, Place 1 spray into both nostrils as needed    vitamin C 500 MG tablet, Take daily    Allergies  Allergen Reactions   Evista [Raloxifene] Other (See Comments)    Blood clots - DVT and Pulmonary Embolism    Fosamax [Alendronate Sodium]     esophagitis    Past Medical History:  Diagnosis Date   AKI (acute kidney injury) ([place]) 08/19/2015   Cataract    Clotting disorder ([place])    DVT of lower extremity (deep venous thrombosis) ([facility])    left, resolved   Elevated hemoglobin A1c    GERD (gastroesophageal reflux disease)    H/O left knee surgery    Hypertension    JAK2 gene mutation    Leiomyoma    adnexal Leiomyoma removal in 1/11   Myeloproliferative disorder ([place])    ? P vera   Nephrolithiasis    Osteoporosis    PE (pulmonary embolism) 08/2015     Health Maintenance  Topic Date Due   Zoster Vaccines- Shingrix (1 of 2) Never done   INFLUENZA VACCINE  02/28/2021   COVID-19 Vaccine (5 - Booster for Pfizer series) 04/12/2021   MAMMOGRAM  07/20/2021   TETANUS/TDAP  12/26/2028   DEXA SCAN  Completed   PNA vac Low Risk Adult  Completed   HPV VACCINES   Aged Out     Immunization History  Administered Date(s) Administered   DT  03/30/2015   Fluad Quad(high Dose ) 07/01/2020   Influenza, High Dose  04/20/2016, 05/02/2017, 05/20/2018   PFIZER Covid-19 Tri-Sucrose Vacc 12/10/2020   PFIZER-SARS-COV-2 Vacc 09/13/2019, 10/06/2019, 06/15/2020   Pneumococcal -13 03/25/2014   Tdap 12/27/2018   Zoster, Live 12/27/2005    Last Colon - 11/01/2017 - Dr Carlean Purl - Recc no f/u   Last MGM - 09/22/2021   Past Surgical History:  Procedure Laterality Date   CHOLECYSTECTOMY     cyst removed off spine     ESOPHAGOGASTRODUODENOSCOPY (EGD) WITH PROPOFOL N/A 11/20/2020   EGD  Mauri Pole, MD   IR IVC FILTER PLMT / S&I /IMG GUID/MOD SED  11/19/2020   IVC FILTER PLACEMENT ([place] HX)  08/2015   JOINT REPLACEMENT Left total knee   TONSILLECTOMY     UTERINE FIBROID SURGERY      Family History  Problem Relation Age of Onset   Hypertension Mother    Diabetes Mother    Breast cancer Mother    Congestive Heart Failure Mother        Died age 41   Osteoporosis Father    Colon cancer Neg Hx    Colon polyps Neg Hx    Esophageal cancer Neg Hx    Rectal cancer Neg Hx    Stomach cancer Neg Hx     Social History   Tobacco Use   Smoking status: Never   Smokeless tobacco: Never  Vaping Use   Vaping Use: Never used  Substance Use Topics   Alcohol use: No   Drug use: No     ROS Constitutional: Denies fever, chills, weight loss/gain, headaches, insomnia,  night sweats, and change in appetite. Does c/o fatigue. Eyes: Denies redness, blurred vision, diplopia, discharge, itchy, watery eyes.  ENT: Denies discharge, congestion, post nasal drip, epistaxis, sore throat, earache, hearing loss, dental pain, Tinnitus, Vertigo, Sinus pain, snoring.  Cardio: Denies chest pain, palpitations, irregular heartbeat, syncope, dyspnea,  diaphoresis, orthopnea, PND, claudication, edema Respiratory: denies cough, dyspnea, DOE, pleurisy, hoarseness, laryngitis,  wheezing.  Gastrointestinal: Denies dysphagia, heartburn, reflux, water brash, pain, cramps, nausea, vomiting, bloating, diarrhea, constipation, hematemesis, melena, hematochezia, jaundice, hemorrhoids Genitourinary: Denies dysuria, frequency, urgency, nocturia, hesitancy, discharge, hematuria, flank pain Breast: Breast lumps, nipple discharge, bleeding.  Musculoskeletal: Denies arthralgia, myalgia, stiffness, Jt. Swelling, pain, limp, and strain/sprain. Denies falls. Skin: Denies puritis, rash, hives, warts, acne, eczema, changing in skin lesion Neuro: No weakness, tremor, incoordination, spasms, paresthesia, pain Psychiatric: Denies confusion, memory loss, sensory loss. Denies Depression. Endocrine: Denies change in weight, skin, hair change, nocturia, and paresthesia, diabetic polys, visual blurring, hyper / hypo glycemic episodes.  Heme/Lymph: No excessive bleeding, bruising, enlarged lymph nodes.  Physical Exam  BP 138/84   Pulse 67   Temp (!) 97.5 F (36.4 C)   Resp 16   Ht '5\' 3"'$  (1.6 m)   Wt 182 lb 3.2 oz (82.6 kg)   SpO2 96%   BMI 32.28 kg/m   General Appearance: Well nourished, well groomed and in no apparent distress.  Eyes: PERRLA, EOMs, conjunctiva no swelling or erythema, normal fundi and vessels. Sinuses: No frontal/maxillary tenderness ENT/Mouth: EACs patent / TMs  nl. Nares clear without erythema, swelling, mucoid exudates. Oral hygiene is good. No erythema, swelling, or exudate. Tongue normal, non-obstructing. Tonsils not swollen or erythematous. Hearing normal.  Neck: Supple, thyroid not palpable. No bruits, nodes or JVD. Respiratory: Respiratory effort normal.  BS equal and clear bilateral without rales, rhonci, wheezing or stridor. Cardio: Heart sounds are normal with regular rate and rhythm and no murmurs, rubs or gallops. Peripheral pulses are normal and equal bilaterally without edema. No aortic or femoral bruits. Chest: symmetric with normal excursions and  percussion. Breasts: Symmetric, without lumps, nipple discharge, retractions, or fibrocystic changes.  Abdomen: Flat, soft with bowel sounds active. Nontender, no guarding, rebound, hernias, masses, or organomegaly.  Lymphatics: Non tender without lymphadenopathy.  Musculoskeletal: Full ROM all peripheral extremities, joint stability, 5/5 strength, and normal gait. Skin: Warm and dry without rashes, lesions, cyanosis, clubbing or  ecchymosis.  Neuro: Cranial nerves intact, reflexes equal bilaterally. Normal muscle tone, no cerebellar symptoms. Sensation intact.  Pysch: Alert and oriented X 3, normal affect, Insight and Judgment appropriate.    Assessment and Plan  1. Annual Preventative Screening Examination   2. Essential hypertension  - EKG 12-Lead - Urinalysis, Routine w reflex microscopic - Microalbumin / creatinine urine ratio - CBC with Differential/Platelet - COMPLETE METABOLIC PANEL WITH GFR - Magnesium - TSH  3. Hyperlipidemia, mixed  - EKG 12-Lead - Lipid panel - TSH  4. Abnormal glucose  - EKG 12-Lead - Hemoglobin A1c - Insulin, random  5. Vitamin D deficiency  - VITAMIN D 25 Hydroxy   6. Myeloproliferative disorder ([place])  - CBC with Differential/Platelet - COMPLETE METABOLIC PANEL WITH GFR  7. Gastroesophageal reflux disease  - CBC with Differential/Platelet  8. History of recurrent deep vein thrombosis (DVT)  - CBC with Differential/Platelet  9. 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 Screening for heart disease  - EKG 12-Lead  11. FHx: heart disease  - EKG 12-Lead  12. Screening for colorectal cancer  - POC Hemoccult Bld/Stl   13. Medication management  - Urinalysis, Routine w reflex microscopic - Microalbumin / creatinine urine ratio - CBC with Differential/Platelet - COMPLETE METABOLIC PANEL WITH GFR - Magnesium - Lipid panel - TSH - Hemoglobin A1c - Insulin, random -  VITAMIN D  25 Hydroxyl          Patient was counseled in prudent diet to achieve/maintain BMI less than 25 for weight control, BP monitoring, regular exercise and medications. Discussed med's effects and SE's. Screening labs and tests as requested with regular follow-up as recommended. Over 40 minutes of exam, counseling, chart review and high complex critical decision making was performed.   Kirtland Bouchard, MD

## 2022-03-09 ENCOUNTER — Ambulatory Visit: Payer: Medicare PPO | Admitting: Internal Medicine

## 2022-03-09 ENCOUNTER — Encounter: Payer: Self-pay | Admitting: Internal Medicine

## 2022-03-09 VITALS — BP 138/84 | HR 67 | Temp 97.5°F | Resp 16 | Ht 63.0 in | Wt 182.2 lb

## 2022-03-09 DIAGNOSIS — D471 Chronic myeloproliferative disease: Secondary | ICD-10-CM

## 2022-03-09 DIAGNOSIS — Z79899 Other long term (current) drug therapy: Secondary | ICD-10-CM

## 2022-03-09 DIAGNOSIS — Z8249 Family history of ischemic heart disease and other diseases of the circulatory system: Secondary | ICD-10-CM

## 2022-03-09 DIAGNOSIS — R7309 Other abnormal glucose: Secondary | ICD-10-CM | POA: Diagnosis not present

## 2022-03-09 DIAGNOSIS — K219 Gastro-esophageal reflux disease without esophagitis: Secondary | ICD-10-CM | POA: Diagnosis not present

## 2022-03-09 DIAGNOSIS — I10 Essential (primary) hypertension: Secondary | ICD-10-CM

## 2022-03-09 DIAGNOSIS — Z86718 Personal history of other venous thrombosis and embolism: Secondary | ICD-10-CM | POA: Diagnosis not present

## 2022-03-09 DIAGNOSIS — Z0001 Encounter for general adult medical examination with abnormal findings: Secondary | ICD-10-CM

## 2022-03-09 DIAGNOSIS — E559 Vitamin D deficiency, unspecified: Secondary | ICD-10-CM | POA: Diagnosis not present

## 2022-03-09 DIAGNOSIS — Z7901 Long term (current) use of anticoagulants: Secondary | ICD-10-CM

## 2022-03-09 DIAGNOSIS — E782 Mixed hyperlipidemia: Secondary | ICD-10-CM

## 2022-03-09 DIAGNOSIS — Z136 Encounter for screening for cardiovascular disorders: Secondary | ICD-10-CM | POA: Diagnosis not present

## 2022-03-09 DIAGNOSIS — Z0000 Encounter for general adult medical examination without abnormal findings: Secondary | ICD-10-CM | POA: Diagnosis not present

## 2022-03-09 DIAGNOSIS — Z1211 Encounter for screening for malignant neoplasm of colon: Secondary | ICD-10-CM

## 2022-03-09 MED ORDER — ESOMEPRAZOLE MAGNESIUM 40 MG PO CPDR: DELAYED_RELEASE_CAPSULE | ORAL | 3 refills | Status: DC

## 2022-03-10 LAB — COMPLETE METABOLIC PANEL WITH GFR
AG Ratio: 1.6 (calc) (ref 1.0–2.5)
ALT: 14 U/L (ref 6–29)
AST: 18 U/L (ref 10–35)
Albumin: 4.2 g/dL (ref 3.6–5.1)
Alkaline phosphatase (APISO): 64 U/L (ref 37–153)
BUN: 17 mg/dL (ref 7–25)
CO2: 25 mmol/L (ref 20–32)
Calcium: 9.7 mg/dL (ref 8.6–10.4)
Chloride: 105 mmol/L (ref 98–110)
Creat: 0.86 mg/dL (ref 0.60–0.95)
Globulin: 2.7 g/dL (calc) (ref 1.9–3.7)
Glucose, Bld: 84 mg/dL (ref 65–99)
Potassium: 4.4 mmol/L (ref 3.5–5.3)
Sodium: 139 mmol/L (ref 135–146)
Total Bilirubin: 1 mg/dL (ref 0.2–1.2)
Total Protein: 6.9 g/dL (ref 6.1–8.1)
eGFR: 68 mL/min/{1.73_m2} (ref >=60)

## 2022-03-10 LAB — MICROALBUMIN / CREATININE URINE RATIO
Creatinine, Urine: 158 mg/dL (ref 20–275)
Microalb Creat Ratio: 122 mcg/mg creat — ABNORMAL HIGH (ref <30)
Microalb, Ur: 19.2 mg/dL

## 2022-03-10 LAB — URINALYSIS, ROUTINE W REFLEX MICROSCOPIC
Bilirubin Urine: NEGATIVE
Glucose, UA: NEGATIVE
Hgb urine dipstick: NEGATIVE
Hyaline Cast: NONE SEEN /LPF
Ketones, ur: NEGATIVE
Nitrite: NEGATIVE
Specific Gravity, Urine: 1.023 (ref 1.001–1.035)
pH: 5.5 (ref 5.0–8.0)

## 2022-03-10 LAB — CBC WITH DIFFERENTIAL/PLATELET
Absolute Monocytes: 451 cells/uL (ref 200–950)
Basophils Absolute: 61 cells/uL (ref 0–200)
Basophils Relative: 1.3 %
Eosinophils Absolute: 113 cells/uL (ref 15–500)
Eosinophils Relative: 2.4 %
HCT: 42.9 % (ref 35.0–45.0)
Hemoglobin: 14.7 g/dL (ref 11.7–15.5)
Lymphs Abs: 1321 cells/uL (ref 850–3900)
MCH: 33.9 pg — ABNORMAL HIGH (ref 27.0–33.0)
MCHC: 34.3 g/dL (ref 32.0–36.0)
MCV: 99.1 fL (ref 80.0–100.0)
MPV: 10.2 fL (ref 7.5–12.5)
Monocytes Relative: 9.6 %
Neutro Abs: 2754 cells/uL (ref 1500–7800)
Neutrophils Relative %: 58.6 %
Platelets: 208 10*3/uL (ref 140–400)
RBC: 4.33 10*6/uL (ref 3.80–5.10)
RDW: 13 % (ref 11.0–15.0)
Total Lymphocyte: 28.1 %
WBC: 4.7 10*3/uL (ref 3.8–10.8)

## 2022-03-10 LAB — HEMOGLOBIN A1C
Hgb A1c MFr Bld: 5.2 % of total Hgb (ref <5.7)
Mean Plasma Glucose: 103 mg/dL
eAG (mmol/L): 5.7 mmol/L

## 2022-03-10 LAB — LIPID PANEL
Cholesterol: 162 mg/dL (ref <200)
HDL: 57 mg/dL (ref >=50)
LDL Cholesterol (Calc): 84 mg/dL (calc)
Non-HDL Cholesterol (Calc): 105 mg/dL (calc) (ref <130)
Total CHOL/HDL Ratio: 2.8 (calc) (ref <5.0)
Triglycerides: 113 mg/dL (ref <150)

## 2022-03-10 LAB — MAGNESIUM: Magnesium: 1.8 mg/dL (ref 1.5–2.5)

## 2022-03-10 LAB — TSH: TSH: 1.37 mIU/L (ref 0.40–4.50)

## 2022-03-10 LAB — INSULIN, RANDOM: Insulin: 17.8 u[IU]/mL

## 2022-03-10 LAB — VITAMIN D 25 HYDROXY (VIT D DEFICIENCY, FRACTURES): Vit D, 25-Hydroxy: 66 ng/mL (ref 30–100)

## 2022-03-10 LAB — MICROSCOPIC MESSAGE

## 2022-03-12 ENCOUNTER — Other Ambulatory Visit: Payer: Self-pay | Admitting: Internal Medicine

## 2022-03-12 DIAGNOSIS — N3000 Acute cystitis without hematuria: Secondary | ICD-10-CM

## 2022-03-12 NOTE — Progress Notes (Signed)
<><><><><><><><><><><><><><><><><><><><><><><><><><><><><><><><><> <><><><><><><><><><><><><><><><><><><><><><><><><><><><><><><><><>  -     U/A OK  - Shows lots of skin cells,  but also WBC & Bacteria, So   need to return for a lab visit to check a urine culture to assure no Infection <><><><><><><><><><><><><><><><><><><><><><><><><><><><><><><><><> <><><><><><><><><><><><><><><><><><><><><><><><><><><><><><><><><>  -  Magnesium  -   1.8  -  very  low- goal is betw 2.0 - 2.5,   - So..............Marland Kitchen  Recommend that you INCREASE your                                                                Magnesium 500 mg tablet to 2 tablets  /day   - also important to eat lots of  leafy green vegetables   - spinach - Kale - collards - greens - okra - asparagus   - broccoli - quinoa - squash - almonds   - black, red, white beans -  peas - green beans <><><><><><><><><><><><><><><><><><><><><><><><><><><><><><><><><>  -   Total Chol = 162    Excellent   - Very low risk for Heart Attack  / Stroke <><><><><><><><><><><><><><><><><><><><><><><><><><><><><><><><><>  -   A1c - Normal - No Diabetes  - Great   <><><><><><><><><><><><><><><><><><><><><><><><><><><><><><><><><>  -   Vitamin D = 66 - Excellent   - Please continue dose same  <><><><><><><><><><><><><><><><><><><><><><><><><><><><><><><><><>  -   All Else - CBC - Kidneys - Electrolytes - Liver - Magnesium & Thyroid    - all  Normal / OK <><><><><><><><><><><><><><><><><><><><><><><><><><><><><><><><><> <><><><><><><><><><><><><><><><><><><><><><><><><><><><><><><><><>  -   Keep up the [place] Work  !  <><><><><><><><><><><><><><><><><><><><><><><><><><><><><><><><><> <><><><><><><><><><><><><><><><><><><><><><><><><><><><><><><><><>

## 2022-03-15 ENCOUNTER — Ambulatory Visit (INDEPENDENT_AMBULATORY_CARE_PROVIDER_SITE_OTHER): Payer: Medicare PPO

## 2022-03-15 DIAGNOSIS — M810 Age-related osteoporosis without current pathological fracture: Secondary | ICD-10-CM | POA: Diagnosis not present

## 2022-03-15 MED ORDER — DENOSUMAB 60 MG/ML ~~LOC~~ SOSY
60.0000 mg | PREFILLED_SYRINGE | Freq: Once | SUBCUTANEOUS | Status: AC
  Administered 2022-03-15: 60 mg via SUBCUTANEOUS

## 2022-03-15 NOTE — Progress Notes (Signed)
Patient seen today for Prolia injection.  60mg/ml given in left arm subQ.  Patient tolerated well.  Follow up 6 months or as advised by provider.Patient verified name, DOB and provided verbal consent prior to administration. 

## 2022-03-20 ENCOUNTER — Other Ambulatory Visit: Payer: Medicare PPO

## 2022-03-20 DIAGNOSIS — N3000 Acute cystitis without hematuria: Secondary | ICD-10-CM | POA: Diagnosis not present

## 2022-03-21 LAB — URINE CULTURE
MICRO NUMBER:: 13808046
SPECIMEN QUALITY:: ADEQUATE

## 2022-03-22 NOTE — Progress Notes (Signed)
<><><><><><><><><><><><><><><><><><><><><><><><><><><><><><><><><> <><><><><><><><><><><><><><><><><><><><><><><><><><><><><><><><><>  -   U/C - is OK - No Infection   <><><><><><><><><><><><><><><><><><><><><><><><><><><><><><><><><> <><><><><><><><><><><><><><><><><><><><><><><><><><><><><><><><><> 

## 2022-04-04 NOTE — Telephone Encounter (Signed)
Last Prolia inj 03/15/22 Next Prolia inj due 09/16/22 

## 2022-04-06 ENCOUNTER — Inpatient Hospital Stay: Payer: Medicare PPO | Admitting: Hematology and Oncology

## 2022-04-06 ENCOUNTER — Encounter: Payer: Self-pay | Admitting: Hematology and Oncology

## 2022-04-06 ENCOUNTER — Other Ambulatory Visit: Payer: Self-pay

## 2022-04-06 ENCOUNTER — Inpatient Hospital Stay: Payer: Medicare PPO | Attending: Hematology and Oncology

## 2022-04-06 DIAGNOSIS — Z86718 Personal history of other venous thrombosis and embolism: Secondary | ICD-10-CM

## 2022-04-06 DIAGNOSIS — D471 Chronic myeloproliferative disease: Secondary | ICD-10-CM | POA: Insufficient documentation

## 2022-04-06 DIAGNOSIS — I10 Essential (primary) hypertension: Secondary | ICD-10-CM | POA: Diagnosis not present

## 2022-04-06 DIAGNOSIS — Z79899 Other long term (current) drug therapy: Secondary | ICD-10-CM | POA: Insufficient documentation

## 2022-04-06 DIAGNOSIS — I82402 Acute embolism and thrombosis of unspecified deep veins of left lower extremity: Secondary | ICD-10-CM

## 2022-04-06 DIAGNOSIS — Z7901 Long term (current) use of anticoagulants: Secondary | ICD-10-CM | POA: Insufficient documentation

## 2022-04-06 DIAGNOSIS — K250 Acute gastric ulcer with hemorrhage: Secondary | ICD-10-CM

## 2022-04-06 LAB — CBC WITH DIFFERENTIAL/PLATELET
Abs Immature Granulocytes: 0.02 10*3/uL (ref 0.00–0.07)
Basophils Absolute: 0.1 10*3/uL (ref 0.0–0.1)
Basophils Relative: 1 %
Eosinophils Absolute: 0.1 10*3/uL (ref 0.0–0.5)
Eosinophils Relative: 2 %
HCT: 47.3 % — ABNORMAL HIGH (ref 36.0–46.0)
Hemoglobin: 15.9 g/dL — ABNORMAL HIGH (ref 12.0–15.0)
Immature Granulocytes: 0 %
Lymphocytes Relative: 32 %
Lymphs Abs: 1.9 10*3/uL (ref 0.7–4.0)
MCH: 34 pg (ref 26.0–34.0)
MCHC: 33.6 g/dL (ref 30.0–36.0)
MCV: 101.1 fL — ABNORMAL HIGH (ref 80.0–100.0)
Monocytes Absolute: 0.5 10*3/uL (ref 0.1–1.0)
Monocytes Relative: 9 %
Neutro Abs: 3.3 10*3/uL (ref 1.7–7.7)
Neutrophils Relative %: 56 %
Platelets: 224 10*3/uL (ref 150–400)
RBC: 4.68 MIL/uL (ref 3.87–5.11)
RDW: 13.4 % (ref 11.5–15.5)
WBC: 5.8 10*3/uL (ref 4.0–10.5)
nRBC: 0 % (ref 0.0–0.2)

## 2022-04-06 NOTE — Assessment & Plan Note (Signed)
She is compliant taking medication as directed She has slight worsening erythrocytosis, could be due to slight dehydration I will see her in 6 weeks for further follow-up I do not plan to order phlebotomy today or adjust her hydrea dose

## 2022-04-06 NOTE — Assessment & Plan Note (Signed)
Her elevated blood pressure today is likely due to anxiety Monitor carefully

## 2022-04-06 NOTE — Progress Notes (Signed)
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[facility]) She is compliant taking medication as directed She has slight worsening erythrocytosis, could be due to slight dehydration I will see her in 6 weeks for further follow-up I do not plan to order phlebotomy today or adjust her hydrea dose  History of recurrent deep vein thrombosis (DVT) She denies bleeding complication from Eliquis She will continue treatment indefinitely due to high risk of recurrent thrombosis We discussed risk of recurrent thrombosis in the setting of dehydration I reminded the patient to drink more liquids  Essential hypertension Her elevated blood pressure today is likely due to anxiety Monitor carefully  No orders of the defined types were placed in this encounter.   All questions were answered. The patient knows to call the clinic with any problems, questions or concerns. The total time spent in the appointment was 20 minutes encounter with patients including review of chart and various tests results, discussions about plan of care and coordination of care plan   [name], MD 04/06/2022 4:26 PM  INTERVAL HISTORY: Please see below for problem oriented charting. she returns for treatment follow-up on hydrea for MPD and eliquis for DVT She admits she might not be drinking enough liquids this morning She is compliant taking Hydrea and denies missing doses She has no bleeding complication from Eliquis  REVIEW OF SYSTEMS:   Constitutional: Denies fevers, chills or abnormal weight loss Eyes: Denies blurriness of vision Ears,  nose, mouth, throat, and face: Denies mucositis or sore throat Respiratory: Denies cough, dyspnea or wheezes Cardiovascular: Denies palpitation, chest discomfort or lower extremity swelling Gastrointestinal:  Denies nausea, heartburn or change in bowel habits Skin: Denies abnormal skin rashes Lymphatics: Denies new lymphadenopathy or easy bruising Neurological:Denies numbness, tingling or new weaknesses Behavioral/Psych: Mood is stable, no new changes  All other systems were reviewed with the patient and are negative.  I have reviewed the past medical history, past surgical history, social history and family history with the patient and they are unchanged from previous note.  ALLERGIES:  is allergic to evista [raloxifene] and fosamax [alendronate sodium].  MEDICATIONS:  Current Outpatient Medications  Medication Sig Dispense Refill   apixaban (ELIQUIS) 5 MG TABS tablet Take 1 tablet (5 mg total) by mouth 2 (two) times daily. 180 tablet 3   Ascorbic Acid (VITAMIN C PO) Take 1 tablet by mouth daily.     calcium carbonate (OS-CAL) 600 MG TABS tablet Take 600 mg by mouth daily with breakfast.      Cholecalciferol (DIALYVITE VITAMIN D 5000) 125 MCG (5000 UT) capsule Take 5,000 Units by mouth daily. Take Monday thru Saturday only.     esomeprazole (NEXIUM) 40 MG capsule Take  1 capsule  Daily  to Prevent Heartburn & Indigestion 90 capsule 3   hydroxyurea (HYDREA) 500 MG capsule TAKE 1 CAPSULE DAILY EXCEPT ON MONDAYS, WEDNESDAYS, FRIDAYS AND SATURDAYS, TAKE 2 CAPSULES 270 capsule 1   MAGNESIUM PO Take 500 mg by mouth daily.     metoprolol succinate (TOPROL-XL) 100 MG 24 hr tablet Take  1 tablet  Daily for BP                                                      /  TAKE 1 TABLET BY MOUTH 90 tablet 3   polyvinyl alcohol (LIQUIFILM TEARS) 1.4 % ophthalmic solution Place 1 drop into both eyes as needed for dry eyes.     sodium chloride (OCEAN) 0.65 % SOLN nasal spray Place 1 spray into  both nostrils as needed for congestion.     vitamin C (ASCORBIC ACID) 500 MG tablet Take 500 mg by mouth daily. With meal     No current facility-administered medications for this visit.    SUMMARY OF ONCOLOGIC HISTORY:  ALEYSHA MECKLER was referred here after recent diagnosis of severe left lower extremity DVT with bilateral PE and cor pulmonale. I reviewed her records extensively and collaborated the history with the patient. The patient has been taking Evista for over 25 years due to strong family history of breast cancer and for osteoporosis management. In January 2017, she had viral gastroenteritis with severe diarrhea and dehydration. The patient has been taking Lasix chronically for mild bilateral lower extremity edema, left greater than the right. 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 She recalled having profuse diarrhea nonstop for 24 hours leading up to the blood clot event. Of note, prior to the diagnosis, she was not taking aspirin consistently  Prior to that she denies recent history of trauma, long distance travel, recent surgery, smoking or prolonged immobilization. She had no prior history or diagnosis of cancer. Her age appropriate screening programs are up-to-date. She had prior surgeries before and never had perioperative thromboembolic events. The patient had been exposed to birth control pills and never had thrombotic events. The patient had been pregnant before and denies history of peripartum thromboembolic event or history of recurrent miscarriages. There is no family history of blood clots or miscarriages.  On 08/18/2015, she presented to the emergency department due to symptoms of severe weakness, chest pressure, shortness of breath and near syncopal episode. She had extensive evaluation in the emergency room. D-dimer was elevated at greater than 20.  CT angiogram dated 08/19/2015 show massive saddle pulmonary  embolus with evidence of severe right heart strain.  Left lower extremity venous Doppler on 08/19/2015 showed findings consistent with acute deep vein thrombosis involving the left femoral vein, left popliteal vein, left posterior tibial vein and left peroneal vein.  Echocardiogram on 08/19/2015 show preserved ejection fraction but with right ventricular dilatation, moderate tricuspid valvular regurgitation, oscillating density on TV(thought to be blood clot in transit) and moderate to severe increased right systolic pressure/evidence of severe pulmonary hypertension  The patient was admitted with IV heparin anticoagulation therapy and placement of IVC filter on 08/20/2015.  Echocardiogram was repeated on 08/23/2015 which showed persistent elevated pulmonary hypertension. The mass over the right tricuspid valve is no longer visible She was hospitalized on 08/18/2015 to 08/24/2015. She was discharged home on Eliquis.  Evista was discontinued. 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  Her anticoagulation was placed on hold recently and she developed acute DVT.  She had IVC filter placed on 11/19/2020 On 11/20/2020, she underwent repeat EGD due to GI bleed which showed - Z-line regular, 36 cm from the incisors. - No gross lesions in  esophagus. - Non-bleeding gastric ulcer with a clean ulcer base (Forrest Class III). - Normal examined duodenum. - No specimens collected. On Dec 09, 2020, she resume Hydrea On February 28, 2021, the dose of hydroxyurea is changed to 500 mg daily except on Sundays she will take 1000  mg by mouth On July 12, 2021, the dose of hydroxyurea is changed to 1000 mg 4 days a week and 500 mg the rest of the week  PHYSICAL EXAMINATION: ECOG PERFORMANCE STATUS: 1 - Symptomatic but completely ambulatory  Vitals:   04/06/22 1213  BP: (!) 153/98  Pulse: 67  Resp: 18  Temp: 98.5 F (36.9 C)  SpO2: 98%   Filed Weights   04/06/22 1213  Weight: 179 lb (81.2 kg)    GENERAL:alert, no distress and comfortable SKIN: skin color, texture, turgor are normal, no rashes or significant lesions EYES: normal, Conjunctiva are pink and non-injected, sclera clear OROPHARYNX:no exudate, no erythema and lips, buccal mucosa, and tongue normal  NECK: supple, thyroid normal size, non-tender, without nodularity LYMPH:  no palpable lymphadenopathy in the cervical, axillary or inguinal LUNGS: clear to auscultation and percussion with normal breathing effort HEART: regular rate & rhythm and no murmurs and no lower extremity edema ABDOMEN:abdomen soft, non-tender and normal bowel sounds Musculoskeletal:no cyanosis of digits and no clubbing  NEURO: alert & oriented x 3 with fluent speech, no focal motor/sensory deficits  LABORATORY DATA:  I have reviewed the data as listed    Component Value Date/Time   NA 139 03/09/2022 1107   K 4.4 03/09/2022 1107   CL 105 03/09/2022 1107   CO2 25 03/09/2022 1107   GLUCOSE 84 03/09/2022 1107   BUN 17 03/09/2022 1107   CREATININE 0.86 03/09/2022 1107   CALCIUM 9.7 03/09/2022 1107   PROT 6.9 03/09/2022 1107   ALBUMIN 4.0 12/09/2020 1219   AST 18 03/09/2022 1107   ALT 14 03/09/2022 1107   ALKPHOS 70 12/09/2020 1219   BILITOT 1.0 03/09/2022 1107   GFRNONAA 56 (L) 12/09/2020 1219   GFRNONAA 61 08/04/2020 1549   GFRAA 71 08/04/2020 1549    No results found for: "SPEP", "UPEP"  Lab Results  Component Value Date   WBC 5.8 04/06/2022   NEUTROABS 3.3 04/06/2022   HGB 15.9 (H) 04/06/2022   HCT 47.3 (H) 04/06/2022   MCV 101.1 (H) 04/06/2022   PLT  224 04/06/2022      Chemistry      Component Value Date/Time   NA 139 03/09/2022 1107   K 4.4 03/09/2022 1107   CL 105 03/09/2022 1107   CO2 25 03/09/2022 1107   BUN 17 03/09/2022 1107   CREATININE 0.86 03/09/2022 1107      Component Value Date/Time   CALCIUM 9.7 03/09/2022 1107   ALKPHOS 70 12/09/2020 1219   AST 18 03/09/2022 1107   ALT 14 03/09/2022 1107   BILITOT 1.0 03/09/2022 1107

## 2022-04-06 NOTE — Assessment & Plan Note (Signed)
She denies bleeding complication from Eliquis She will continue treatment indefinitely due to high risk of recurrent thrombosis We discussed risk of recurrent thrombosis in the setting of dehydration I reminded the patient to drink more liquids

## 2022-05-18 ENCOUNTER — Inpatient Hospital Stay: Payer: Medicare PPO | Admitting: Hematology and Oncology

## 2022-05-18 ENCOUNTER — Inpatient Hospital Stay: Payer: Medicare PPO | Attending: Hematology and Oncology

## 2022-05-18 ENCOUNTER — Other Ambulatory Visit: Payer: Self-pay

## 2022-05-18 ENCOUNTER — Encounter: Payer: Self-pay | Admitting: Hematology and Oncology

## 2022-05-18 VITALS — BP 167/97 | HR 69 | Temp 97.6°F | Resp 18 | Ht 63.0 in | Wt 181.2 lb

## 2022-05-18 DIAGNOSIS — Z79899 Other long term (current) drug therapy: Secondary | ICD-10-CM | POA: Insufficient documentation

## 2022-05-18 DIAGNOSIS — Z86711 Personal history of pulmonary embolism: Secondary | ICD-10-CM | POA: Diagnosis not present

## 2022-05-18 DIAGNOSIS — D471 Chronic myeloproliferative disease: Secondary | ICD-10-CM

## 2022-05-18 DIAGNOSIS — K250 Acute gastric ulcer with hemorrhage: Secondary | ICD-10-CM

## 2022-05-18 DIAGNOSIS — I82402 Acute embolism and thrombosis of unspecified deep veins of left lower extremity: Secondary | ICD-10-CM

## 2022-05-18 DIAGNOSIS — Z86718 Personal history of other venous thrombosis and embolism: Secondary | ICD-10-CM | POA: Insufficient documentation

## 2022-05-18 DIAGNOSIS — Z7901 Long term (current) use of anticoagulants: Secondary | ICD-10-CM | POA: Insufficient documentation

## 2022-05-18 LAB — CBC WITH DIFFERENTIAL/PLATELET
Abs Immature Granulocytes: 0.01 10*3/uL (ref 0.00–0.07)
Basophils Absolute: 0.1 10*3/uL (ref 0.0–0.1)
Basophils Relative: 1 %
Eosinophils Absolute: 0.1 10*3/uL (ref 0.0–0.5)
Eosinophils Relative: 2 %
HCT: 44.1 % (ref 36.0–46.0)
Hemoglobin: 14.7 g/dL (ref 12.0–15.0)
Immature Granulocytes: 0 %
Lymphocytes Relative: 31 %
Lymphs Abs: 1.5 10*3/uL (ref 0.7–4.0)
MCH: 34 pg (ref 26.0–34.0)
MCHC: 33.3 g/dL (ref 30.0–36.0)
MCV: 102.1 fL — ABNORMAL HIGH (ref 80.0–100.0)
Monocytes Absolute: 0.5 10*3/uL (ref 0.1–1.0)
Monocytes Relative: 11 %
Neutro Abs: 2.7 10*3/uL (ref 1.7–7.7)
Neutrophils Relative %: 55 %
Platelets: 201 10*3/uL (ref 150–400)
RBC: 4.32 MIL/uL (ref 3.87–5.11)
RDW: 13.6 % (ref 11.5–15.5)
WBC: 4.9 10*3/uL (ref 4.0–10.5)
nRBC: 0 % (ref 0.0–0.2)

## 2022-05-18 NOTE — Progress Notes (Signed)
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 So far, she has no bleeding complications  No orders of the defined types were placed in this encounter.   All questions were answered. The patient knows to call the clinic with any problems, questions or concerns. The total time spent in the appointment was 20 minutes encounter with patients including review of chart and various tests results, discussions about plan of care and coordination of care plan   Kimberly Lark, MD 05/18/2022 12:37 PM  INTERVAL HISTORY: Please see below for problem oriented charting. she returns for treatment follow-up on hydroxyurea for myeloproliferative disorder and Eliquis for history of recurrent DVT She is compliant taking medications as directed She is hydrating adequately before appointment today No recent bleeding complications  REVIEW OF SYSTEMS:   Constitutional: Denies fevers, chills or abnormal weight loss Eyes: Denies blurriness of vision Ears, nose, mouth, throat, and face: Denies mucositis or sore throat Respiratory: Denies cough, dyspnea or wheezes Cardiovascular: Denies palpitation, chest  discomfort or lower extremity swelling Gastrointestinal:  Denies nausea, heartburn or change in bowel habits Skin: Denies abnormal skin rashes Lymphatics: Denies new lymphadenopathy or easy bruising Neurological:Denies numbness, tingling or new weaknesses Behavioral/Psych: Mood is stable, no new changes  All other systems were reviewed with the patient and are negative.  I have reviewed the past medical history, past surgical history, social history and family history with the patient and they are unchanged from previous note.  ALLERGIES:  is allergic to evista [raloxifene] and fosamax [alendronate sodium].  MEDICATIONS:  Current Outpatient Medications  Medication Sig Dispense Refill   apixaban (ELIQUIS) 5 MG TABS tablet Take 1 tablet (5 mg total) by mouth 2 (two) times daily. 180 tablet 3   Ascorbic Acid (VITAMIN C PO) Take 1 tablet by mouth daily.     calcium carbonate (OS-CAL) 600 MG TABS tablet Take 600 mg by mouth daily with breakfast.      Cholecalciferol (DIALYVITE VITAMIN D 5000) 125 MCG (5000 UT) capsule Take 5,000 Units by mouth daily. Take Monday thru Saturday only.     esomeprazole (NEXIUM) 40 MG capsule Take  1 capsule  Daily  to Prevent Heartburn & Indigestion 90 capsule 3   hydroxyurea (HYDREA) 500 MG capsule TAKE 1 CAPSULE DAILY EXCEPT ON MONDAYS, WEDNESDAYS, FRIDAYS AND SATURDAYS, TAKE 2 CAPSULES 270 capsule 1   MAGNESIUM PO Take 500 mg by mouth daily.     metoprolol succinate (TOPROL-XL) 100 MG 24 hr tablet Take  1 tablet  Daily for BP                                                      /  TAKE 1 TABLET BY MOUTH 90 tablet 3   polyvinyl alcohol (LIQUIFILM TEARS) 1.4 % ophthalmic solution Place 1 drop into both eyes as needed for dry eyes.     sodium chloride (OCEAN) 0.65 % SOLN nasal spray Place 1 spray into both nostrils as needed for congestion.     vitamin C (ASCORBIC ACID) 500 MG tablet Take 500 mg by mouth daily. With meal     No current  facility-administered medications for this visit.    SUMMARY OF ONCOLOGIC HISTORY:  Kimberly Watson was referred here after recent diagnosis of severe left lower extremity DVT with bilateral PE and cor pulmonale. I reviewed her records extensively and collaborated the history with the patient. The patient has been taking Evista for over 25 years due to strong family history of breast cancer and for osteoporosis management. In January 2017, she had viral gastroenteritis with severe diarrhea and dehydration. The patient has been taking Lasix chronically for mild bilateral lower extremity edema, left greater than the right. 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 She recalled having profuse diarrhea nonstop for 24 hours leading up to the blood clot event. Of note, prior to the diagnosis, she was not taking aspirin consistently  Prior to that she denies recent history of trauma, long distance travel, recent surgery, smoking or prolonged immobilization. She had no prior history or diagnosis of cancer. Her age appropriate screening programs are up-to-date. She had prior surgeries before and never had perioperative thromboembolic events. The patient had been exposed to birth control pills and never had thrombotic events. The patient had been pregnant before and denies history of peripartum thromboembolic event or history of recurrent miscarriages. There is no family history of blood clots or miscarriages.  On 08/18/2015, she presented to the emergency department due to symptoms of severe weakness, chest pressure, shortness of breath and near syncopal episode. She had extensive evaluation in the emergency room. D-dimer was elevated at greater than 20.  CT angiogram dated 08/19/2015 show massive saddle pulmonary embolus with evidence of severe right heart strain.  Left lower extremity venous Doppler on 08/19/2015 showed findings consistent with  acute deep vein thrombosis involving the left femoral vein, left popliteal vein, left posterior tibial vein and left peroneal vein.  Echocardiogram on 08/19/2015 show preserved ejection fraction but with right ventricular dilatation, moderate tricuspid valvular regurgitation, oscillating density on TV(thought to be blood clot in transit) and moderate to severe increased right systolic pressure/evidence of severe pulmonary hypertension  The patient was admitted with IV heparin anticoagulation therapy and placement of IVC filter on 08/20/2015.  Echocardiogram was repeated on 08/23/2015 which showed persistent elevated pulmonary hypertension. The mass over the right tricuspid valve is no longer visible She was hospitalized on 08/18/2015 to 08/24/2015. She was discharged home on Eliquis.  Evista was discontinued. 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  Her anticoagulation was placed on hold recently and she developed acute DVT.  She had IVC filter placed on 11/19/2020 On 11/20/2020, she underwent repeat EGD due to GI bleed which showed - Z-line regular, 36 cm from the incisors. - No gross lesions in  esophagus. - Non-bleeding gastric ulcer with a clean ulcer base (Forrest Class III). - Normal examined duodenum. - No specimens collected. On Dec 09, 2020, she resume Hydrea On February 28, 2021, the dose of hydroxyurea is changed to 500 mg daily except on Sundays she will take 1000 mg by mouth On July 12, 2021, the dose of hydroxyurea is changed to 1000 mg 4 days a week and 500 mg the rest of the  week   PHYSICAL EXAMINATION: ECOG PERFORMANCE STATUS: 0 - Asymptomatic  Vitals:   05/18/22 1122  BP: (!) 167/97  Pulse: 69  Resp: 18  Temp: 97.6 F (36.4 C)  SpO2: 95%   Filed Weights   05/18/22 1122  Weight: 181 lb 3.2 oz (82.2 kg)    GENERAL:alert, no distress and comfortable NEURO: alert & oriented x 3 with fluent speech, no focal motor/sensory deficits  LABORATORY DATA:  I have reviewed the data as listed    Component Value Date/Time   NA 139 03/09/2022 1107   K 4.4 03/09/2022 1107   CL 105 03/09/2022 1107   CO2 25 03/09/2022 1107   GLUCOSE 84 03/09/2022 1107   BUN 17 03/09/2022 1107   CREATININE 0.86 03/09/2022 1107   CALCIUM 9.7 03/09/2022 1107   PROT 6.9 03/09/2022 1107   ALBUMIN 4.0 12/09/2020 1219   AST 18 03/09/2022 1107   ALT 14 03/09/2022 1107   ALKPHOS 70 12/09/2020 1219   BILITOT 1.0 03/09/2022 1107   GFRNONAA 56 (L) 12/09/2020 1219   GFRNONAA 61 08/04/2020 1549   GFRAA 71 08/04/2020 1549    No results found for: "SPEP", "UPEP"  Lab Results  Component Value Date   WBC 4.9 05/18/2022   NEUTROABS 2.7 05/18/2022   HGB 14.7 05/18/2022   HCT 44.1 05/18/2022   MCV 102.1 (H) 05/18/2022   PLT 201 05/18/2022      Chemistry      Component Value Date/Time   NA 139 03/09/2022 1107   K 4.4 03/09/2022 1107   CL 105 03/09/2022 1107   CO2 25 03/09/2022 1107   BUN 17 03/09/2022 1107   CREATININE 0.86 03/09/2022 1107      Component Value Date/Time   CALCIUM 9.7 03/09/2022 1107   ALKPHOS 70 12/09/2020 1219   AST 18 03/09/2022 1107   ALT 14 03/09/2022 1107   BILITOT 1.0 03/09/2022 1107

## 2022-05-18 NOTE — Assessment & Plan Note (Signed)
She is compliant taking medication as directed With adequate hydration, her CBC is back to normal She will continue current dose of hydroxyurea without changes I plan to see her again in 4 months for further follow-up 

## 2022-05-18 NOTE — Assessment & Plan Note (Signed)
She had history of recurrent thrombosis She is doing well on chronic anticoagulation therapy I recommend she continue the same and indefinitely. So far, she has no bleeding complications

## 2022-06-24 ENCOUNTER — Other Ambulatory Visit: Payer: Self-pay | Admitting: Internal Medicine

## 2022-06-24 DIAGNOSIS — I10 Essential (primary) hypertension: Secondary | ICD-10-CM

## 2022-07-06 DIAGNOSIS — H47321 Drusen of optic disc, right eye: Secondary | ICD-10-CM | POA: Diagnosis not present

## 2022-07-06 DIAGNOSIS — H1045 Other chronic allergic conjunctivitis: Secondary | ICD-10-CM | POA: Diagnosis not present

## 2022-07-06 DIAGNOSIS — H26493 Other secondary cataract, bilateral: Secondary | ICD-10-CM | POA: Diagnosis not present

## 2022-07-06 DIAGNOSIS — H04123 Dry eye syndrome of bilateral lacrimal glands: Secondary | ICD-10-CM | POA: Diagnosis not present

## 2022-07-17 ENCOUNTER — Telehealth: Payer: Self-pay

## 2022-07-17 ENCOUNTER — Other Ambulatory Visit: Payer: Self-pay | Admitting: Hematology and Oncology

## 2022-07-17 NOTE — Telephone Encounter (Signed)
Called and left a message asking her to call the office back. 

## 2022-07-17 NOTE — Telephone Encounter (Signed)
-----   Message from [name], MD sent at 07/17/2022 10:33 AM EST ----- Her pharmacy requested refill for BOTH 2.5 mg and 5 mg eliquis Can you ask if she needs refill? I suggest we move forward with 2.5 mg dose in the future (I think the last refill was the 5 mg dose)

## 2022-07-17 NOTE — Telephone Encounter (Signed)
She called back. Given below message. She is agreeable to 2.5 mg dosage of Eliquis in the future. Sent Dr. Alvy Bimler a message.

## 2022-08-03 ENCOUNTER — Other Ambulatory Visit: Payer: Self-pay | Admitting: Hematology and Oncology

## 2022-08-04 ENCOUNTER — Ambulatory Visit: Payer: Medicare PPO | Admitting: Nurse Practitioner

## 2022-08-10 ENCOUNTER — Encounter: Payer: Self-pay | Admitting: Nurse Practitioner

## 2022-08-10 ENCOUNTER — Ambulatory Visit: Payer: Medicare PPO | Admitting: Nurse Practitioner

## 2022-08-10 VITALS — BP 158/90 | HR 73 | Temp 97.5°F | Ht 63.0 in | Wt 183.2 lb

## 2022-08-10 DIAGNOSIS — E559 Vitamin D deficiency, unspecified: Secondary | ICD-10-CM | POA: Diagnosis not present

## 2022-08-10 DIAGNOSIS — N200 Calculus of kidney: Secondary | ICD-10-CM

## 2022-08-10 DIAGNOSIS — M1990 Unspecified osteoarthritis, unspecified site: Secondary | ICD-10-CM

## 2022-08-10 DIAGNOSIS — M810 Age-related osteoporosis without current pathological fracture: Secondary | ICD-10-CM | POA: Diagnosis not present

## 2022-08-10 DIAGNOSIS — R7309 Other abnormal glucose: Secondary | ICD-10-CM | POA: Diagnosis not present

## 2022-08-10 DIAGNOSIS — E782 Mixed hyperlipidemia: Secondary | ICD-10-CM

## 2022-08-10 DIAGNOSIS — D471 Chronic myeloproliferative disease: Secondary | ICD-10-CM

## 2022-08-10 DIAGNOSIS — Z0000 Encounter for general adult medical examination without abnormal findings: Secondary | ICD-10-CM

## 2022-08-10 DIAGNOSIS — Z86718 Personal history of other venous thrombosis and embolism: Secondary | ICD-10-CM

## 2022-08-10 DIAGNOSIS — Z0001 Encounter for general adult medical examination with abnormal findings: Secondary | ICD-10-CM | POA: Diagnosis not present

## 2022-08-10 DIAGNOSIS — E669 Obesity, unspecified: Secondary | ICD-10-CM | POA: Diagnosis not present

## 2022-08-10 DIAGNOSIS — Z6832 Body mass index (BMI) 32.0-32.9, adult: Secondary | ICD-10-CM

## 2022-08-10 DIAGNOSIS — Z79899 Other long term (current) drug therapy: Secondary | ICD-10-CM | POA: Diagnosis not present

## 2022-08-10 DIAGNOSIS — Z7901 Long term (current) use of anticoagulants: Secondary | ICD-10-CM

## 2022-08-10 DIAGNOSIS — I10 Essential (primary) hypertension: Secondary | ICD-10-CM

## 2022-08-10 DIAGNOSIS — E6609 Other obesity due to excess calories: Secondary | ICD-10-CM

## 2022-08-10 DIAGNOSIS — R6889 Other general symptoms and signs: Secondary | ICD-10-CM | POA: Diagnosis not present

## 2022-08-10 DIAGNOSIS — K219 Gastro-esophageal reflux disease without esophagitis: Secondary | ICD-10-CM

## 2022-08-10 NOTE — Patient Instructions (Signed)

## 2022-08-10 NOTE — Progress Notes (Signed)
Medicare wellness  Assessment:   Annual Medicare Wellness Visit Due annually  Health maintenance reviewed  Essential hypertension Discussed DASH (Dietary Approaches to Stop Hypertension) DASH diet is lower in sodium than a typical American diet. Cut back on foods that are high in saturated fat, cholesterol, and trans fats. Eat more whole-grain foods, fish, poultry, and nuts Remain active and exercise as tolerated daily.  Monitor BP at home-Call if greater than 130/80.  Check CMP/CBC  Mixed hyperlipidemia Discussed lifestyle modifications. Recommended diet heavy in fruits and veggies, omega 3's. Decrease consumption of animal meats, cheeses, and dairy products. Remain active and exercise as tolerated. Continue to monitor. Check lipids/TSH   Other abnormal glucose Recent A1Cs at goal Discussed diet/exercise, weight management  Defer A1C; check CMP  Osteoporosis Continue CA and vitamin D Dr. Cruzita Lederer following for prolia  Pursue a combination of weight-bearing exercises and strength training. Advised on fall prevention measures including proper lighting in all rooms, removal of area rugs and floor clutter, use of walking devices as deemed appropriate, avoidance of uneven walking surfaces. Smoking cessation and moderate alcohol consumption if applicable Consume 287 to 1000 IU of vitamin D daily with a goal vitamin D serum value of 30 ng/mL or higher. Aim for 1000 to 1200 mg of elemental calcium daily through supplements and/or dietary sources.   Vitamin D deficiency At goal at recent check; continue to recommend supplementation for goal of 60-100 Monitor levels  Medication management All medications discussed and reviewed in full. All questions and concerns regarding medications addressed.    Nephrolithiasis Stay well hydrated Continue to monitor   GERD/hiatal hernia No suspected reflux complications ([place]/stricture). Lifestyle modification:  wt loss, avoid meals  2-3h before bedtime. Consider eliminating food triggers:  chocolate, caffeine, EtOH, acid/spicy food.   Obesity - BMI 30 Discussed appropriate BMI Diet modification. Physical activity. Encouraged/praised to build confidence.   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 Alvy Bimler is following Continue to monitor   Myeloproliferative disorder [facility]) Dr. Alvy Bimler following, good response on hydroxyurea  R knee arthritis Managing with tylenol/rest  Dr. Wynelle Link follows but high risk for surgery- deferring Continue regular exercise for strength and balance  Hyperlipidemia Discussed lifestyle modifications. Recommended diet heavy in fruits and veggies, omega 3's. Decrease consumption of animal meats, cheeses, and dairy products. Remain active and exercise as tolerated. Continue to monitor. Check lipids/TSH  Abnormal Glucose Education: Reviewed 'ABCs' of diabetes management  Discussed goals to be met and/or maintained include A1C (<7) Blood pressure (<130/80) Cholesterol (LDL <70) Continue Eye Exam yearly  Continue Dental Exam Q6 mo Discussed dietary recommendations Discussed Physical Activity recommendations Check A1C  Orders Placed This Encounter  Procedures   CBC with Differential/Platelet   COMPLETE METABOLIC PANEL WITH GFR   Lipid panel   Hemoglobin A1c   VITAMIN D 25 Hydroxy (Vit-D Deficiency, Fractures)    Future Appointments  Date Time Provider [place]  09/26/2022 11:30 AM [facility] None  09/26/2022 12:00 PM [name], MD [facility] None  11/13/2022 11:00 AM Philemon Kingdom, MD [facility] None  12/05/2022 11:30 AM Darrol Jump, NP [facility] None  03/13/2023 11:00 AM Unk Pinto, MD [facility] None  08/13/2023 11:00 AM Darrol Jump, NP [facility] None     Plan:   During the course of the visit the patient was educated and counseled about  appropriate screening and preventive services including:  6 month Influenza vaccine- Screening electrocardiogram Screening mammography- yearly Bone densitometry  screening Colorectal cancer screening Diabetes screening- annually Glaucoma screening- yearly Nutrition counseling  Advanced directives:    Subjective:   Kimberly Watson is a 82 y.o. female who presents for Medicare Annual Wellness Visit and 3 month follow up. She has Essential hypertension; Hyperlipidemia, mixed; Vitamin D deficiency; GERD (gastroesophageal reflux disease); Nephrolithiasis; Osteoporosis; Medication management; Obesity (BMI 30.0-34.9); History of recurrent deep vein thrombosis (DVT); Hiatal hernia; Diverticulosis; Osteoarthritis of right knee; Myeloproliferative disorder ([place]); 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  Overall she reports feeling well.  Has no new concerns to address at this time.  Was seen by [facility] 12/14/21 for 1 mo post-op Pseudophakia (new lens).  1 mo post op from CE/IOL.  She was instructed to d/c the inflammatory meds. She was provided a new glass Rx.  She was delighted with her new vision. She was to f/u in 6 mo 05/2022 Dr. Herbert Deaner.  Overall feeling well.  She has R knee bone on bone arthritis, follows with Dr. Wynelle Link, intermittently uses cane at home if needed for instability but reports limited pain. Currently well controlled.  Continues to walk with straight cane.   She was on evista for osteoporosis, she was taken off due to submassive bilateral saddle PE with right heart strain in 2017. Has had recurrent DVT/PEs. She follows with Dr. Alvy Bimler, chronically on eliquis 5 mg BID. She was also diagnosed with myeloproliferative disorder, has therapeutic phlebotomies on hydroxyurea with apparent good response. She follows as directed.  Now follows with Dr. Cruzita Lederer for osteoporosis, doing  prolia. The DEXA 07/2019 showed  T-3.5 latest in 08/2021 with improved T-Score of -2.5.  She has history of nephrolithiasis, follows with Dr. Karsten Ro.   Denies any recent flares.   GERD doing well with lifestyle modification. She does have hiatal hernia, hx of esophageal stricture, had dilation x 3, last in 2019.   BMI is Body mass index is 32.45 kg/m., she has not been working on diet and exercise.  Wt Readings from Last 3 Encounters:  08/10/22 183 lb 3.2 oz (83.1 kg)  05/18/22 181 lb 3.2 oz (82.2 kg)  04/06/22 179 lb (81.2 kg)   Her blood pressure has been controlled at home (intermittently elevated due to myeloproliferative disorder), today their BP is BP: (!) 158/90  She denies chest pain, shortness of breath, dizziness.   Her cholesterol is diet controlled.  Her cholesterol is controlled. The cholesterol last visit was:   Lab Results  Component Value Date   CHOL 162 03/09/2022   HDL 57 03/09/2022   LDLCALC 84 03/09/2022   TRIG 113 03/09/2022   CHOLHDL 2.8 03/09/2022   She has been working on diet and exercise for glucose management, and has no polyuria or polydipsia, no chest pain, dyspnea or TIAs, no numbness, tingling or pain in extremities. Last A1C in the office was:  Lab Results  Component Value Date   HGBA1C 5.2 03/09/2022   She is working to get in 64 fluid ounces daily. Last GFR:  Lab Results  Component Value Date   GFRNONAA 56 (L) 12/09/2020   Patient is on Vitamin D supplement.  Lab Results  Component Value Date   VD25OH 66 03/09/2022        Medication Review Current Outpatient Medications on File Prior to Visit  Medication Sig Dispense Refill   apixaban (ELIQUIS) 2.5 MG TABS tablet TAKE 1 TABLET BY MOUTH TWICE A DAY TO  PREVENT BLOOD CLOTS 180 tablet 0   calcium carbonate (OS-CAL) 600 MG TABS tablet Take 600 mg by mouth daily with breakfast.      Cholecalciferol (DIALYVITE VITAMIN D 5000) 125 MCG (5000 UT) capsule Take 5,000 Units by mouth daily. Take  Monday thru Saturday only.     esomeprazole (NEXIUM) 40 MG capsule Take  1 capsule  Daily  to Prevent Heartburn & Indigestion 90 capsule 3   hydroxyurea (HYDREA) 500 MG capsule TAKE 1 CAPSULE DAILY EXCEPT ON MONDAYS, WEDNESDAYS, FRIDAYS AND SATURDAYS, TAKE 2 CAPSULES 270 capsule 1   MAGNESIUM PO Take 500 mg by mouth daily.     metoprolol succinate (TOPROL-XL) 100 MG 24 hr tablet Take  1 tablet  Daily  for BP                                                       /                                                       TAKE                                     BY                             MOUTH 90 tablet 3   polyvinyl alcohol (LIQUIFILM TEARS) 1.4 % ophthalmic solution Place 1 drop into both eyes as needed for dry eyes.     sodium chloride (OCEAN) 0.65 % SOLN nasal spray Place 1 spray into both nostrils as needed for congestion.     vitamin C (ASCORBIC ACID) 500 MG tablet Take 500 mg by mouth daily. With meal     apixaban (ELIQUIS) 5 MG TABS tablet Take 1 tablet (5 mg total) by mouth 2 (two) times daily. (Patient not taking: Reported on 08/10/2022) 180 tablet 3   Ascorbic Acid (VITAMIN C PO) Take 1 tablet by mouth daily. (Patient not taking: Reported on 08/10/2022)     No current facility-administered medications on file prior to visit.    Current Problems (verified) Patient Active Problem List   Diagnosis Date Noted   History of esophageal dilatation 11/02/2020   Poor vision 10/02/2019   Abnormal glucose 09/18/2019   Chronic anticoagulation (apixaban) due to DVT/PE 10/08/2017   Myeloproliferative disorder ([place]) 08/27/2017   Hiatal hernia 08/09/2017   Diverticulosis 08/09/2017   Osteoarthritis of right knee 08/09/2017   History of recurrent deep vein thrombosis (DVT)    Osteoporosis 03/30/2015   Medication management 03/30/2015   Obesity (BMI 30.0-34.9) 03/30/2015   Essential hypertension 06/18/2013   Hyperlipidemia, mixed 06/18/2013   Vitamin D deficiency 06/18/2013   GERD  (gastroesophageal reflux disease) 06/18/2013   Nephrolithiasis 06/18/2013   Screening Tests Immunization History  Administered Date(s) Administered   DT (Pediatric) 03/30/2015   Fluad Quad(high Dose 65+) 07/01/2020   Influenza, High Dose Seasonal PF 04/20/2016, 05/02/2017, 05/20/2018, 05/28/2021, 05/06/2022   PFIZER Comirnaty(Gray Top)Covid-19 Tri-Sucrose Vaccine 12/10/2020   PFIZER(Purple Top)SARS-COV-2 Vaccination 09/13/2019, 10/06/2019, 06/15/2020  Pneumococcal Conjugate-13 03/25/2014   Tdap 12/27/2018   Zoster, Live 12/27/2005    Preventative care: Last colonoscopy: 10/2017, no polyps, diverticulosis, Dr. Carlean Purl, no follow up recommended EGD: 10/2017 Last mammogram: Due 08/2022, scheduled Last pap smear/pelvic exam: remote declines another DEXA: 08/2021 T-Score -2.5 following Dr. Cruzita Lederer  Prior vaccinations: TD or Tdap: 2016 Influenza: 04/2022 Prevnar 13: 2015 Pneumococcal: 2008  Shingles/Zostavax: 2007 Covid 19: 2/2, 2021, pfizer last boosted 11/2020  Names of Other Physician/Practitioners you currently use: 1. [facility]- here for primary care 2. Dr.Hecker, eye doctor, last visit Glasses/contacts, last 2021, has follow up planned Feb 2023 3. Jud[facility] Horn Dentist, last visit 2023, every 6 months  Patient Care Team: Unk Pinto, MD as PCP - General (Internal Medicine) Monna Fam, MD as Consulting Physician (Ophthalmology) Gatha Mayer, MD as Consulting Physician (Gastroenterology) Kathie Rhodes, MD (Inactive) as Consulting Physician (Urology) Cheri Fowler, MD as Consulting Physician (Obstetrics and Gynecology)   Allergies Allergies  Allergen Reactions   Evista [Raloxifene] Other (See Comments)    Blood clots - DVT and Pulmonary Embolism    Fosamax [Alendronate Sodium]     esophagitis    SURGICAL HISTORY She  has a past surgical history that includes Joint replacement (Left, total knee); Uterine fibroid surgery;  Cholecystectomy; IVC FILTER PLACEMENT ([facility] HX) (08/2015); Tonsillectomy; cyst removed off spine; IR IVC FILTER PLMT / S&I /IMG GUID/MOD SED (11/19/2020); and Esophagogastroduodenoscopy (egd) with propofol (N/A, 11/20/2020). FAMILY HISTORY Her family history includes Breast cancer in her mother; Congestive Heart Failure in her mother; Diabetes in her mother; Hypertension in her mother; Osteoporosis in her father. SOCIAL HISTORY She  reports that she has never smoked. She has never used smokeless tobacco. She reports that she does not drink alcohol and does not use drugs.  MEDICARE WELLNESS OBJECTIVES: Physical activity:   Cardiac risk factors:   Depression/mood screen:      08/10/2022    1:37 PM  Depression screen PHQ 2/9  Decreased Interest 0  Down, Depressed, Hopeless 0  PHQ - 2 Score 0    ADLs:     08/10/2022   11:40 AM 03/09/2022    9:37 PM  In your present state of health, do you have any difficulty performing the following activities:  Hearing? 1 0  Vision? 0 0  Comment Cataract Surgery 10/2021 Uses glasses to drive.   Difficulty concentrating or making decisions? 0 0  Walking or climbing stairs? 0 0  Dressing or bathing? 0 0  Doing errands, shopping? 0 0  Preparing Food and eating ? N   Using the Toilet? N   In the past six months, have you accidently leaked urine? N   Do you have problems with loss of bowel control? N   Managing your Medications? N   Managing your Finances? N   Housekeeping or managing your Housekeeping? N     Cognitive Testing  Alert? Yes  Normal Appearance?Yes  Oriented to person? Yes  Place? Yes   Time? Yes  Recall of three objects?  Yes  Can perform simple calculations? Yes  Displays appropriate judgment?Yes  Can read the correct time from a watch face?Yes  EOL planning: Does Patient Have a Medical Advance Directive?: Yes Type of Advance Directive: Living will   Review of Systems  Constitutional:  Negative for malaise/fatigue and weight loss.   HENT:  Negative for hearing loss and tinnitus.   Eyes:  Negative for blurred vision and double vision.  Respiratory:  Negative for  cough, sputum production, shortness of breath and wheezing.   Cardiovascular:  Negative for chest pain, palpitations, orthopnea, claudication, leg swelling and PND.  Gastrointestinal:  Negative for abdominal pain, blood in stool, constipation, diarrhea, heartburn, melena, nausea and vomiting.  Genitourinary: Negative.   Musculoskeletal:  Positive for joint pain (R knee, postponing surgery ). Negative for back pain, falls and myalgias.  Skin:  Negative for rash.  Neurological:  Negative for dizziness, tingling, sensory change, weakness and headaches.  Endo/Heme/Allergies:  Negative for polydipsia.  Psychiatric/Behavioral: Negative.  Negative for depression, memory loss, substance abuse and suicidal ideas. The patient is not nervous/anxious and does not have insomnia.   All other systems reviewed and are negative.    Objective:   Blood pressure (!) 158/90, pulse 73, temperature (!) 97.5 F (36.4 C), height '5\' 3"'$  (1.6 m), weight 183 lb 3.2 oz (83.1 kg), SpO2 94 %. Body mass index is 32.45 kg/m.  General appearance: alert, no distress, WD/WN,  female HEENT: normocephalic, sclerae anicteric, R canal obstructed by hard wax, L clear, L TM pearly, nares patent, no discharge or erythema, pharynx normal Oral cavity: MMM, no lesions Neck: supple, no lymphadenopathy, no thyromegaly, no masses Heart: RRR, normal S1, S2, no murmurs Lungs: CTA bilaterally, no wheezes, rhonchi, or rales Abdomen: +bs, soft, non tender, non distended, no masses, no hepatomegaly, no splenomegaly Musculoskeletal: nontender, no swelling, no obvious deformity,  Extremities: no edema, no cyanosis, no clubbing Pulses: 2+ symmetric, upper and lower extremities, normal cap refill Neurological: alert, oriented x 3, CN2-12 intact, strength normal upper extremities and lower extremities, sensation  normal throughout, DTRs 2+ throughout, no cerebellar signs, gait antalgic Psychiatric: normal affect, behavior normal, pleasant    Medicare Attestation I have personally reviewed: The patient's medical and social history Their use of alcohol, tobacco or illicit drugs Their current medications and supplements The patient's functional ability including ADLs,fall risks, home safety risks, cognitive, and hearing and visual impairment Diet and physical activities Evidence for depression or mood disorders  The patient's weight, height, BMI, and visual acuity have been recorded in the chart.  I have made referrals, counseling, and provided education to the patient based on review of the above and I have provided the patient with a written personalized care plan for preventive services.     Darrol Jump, NP   08/10/2022

## 2022-08-11 LAB — CBC WITH DIFFERENTIAL/PLATELET
Absolute Monocytes: 475 cells/uL (ref 200–950)
Basophils Absolute: 40 cells/uL (ref 0–200)
Basophils Relative: 0.9 %
Eosinophils Absolute: 110 cells/uL (ref 15–500)
Eosinophils Relative: 2.5 %
HCT: 42.6 % (ref 35.0–45.0)
Hemoglobin: 14.9 g/dL (ref 11.7–15.5)
Lymphs Abs: 1263 cells/uL (ref 850–3900)
MCH: 34.7 pg — ABNORMAL HIGH (ref 27.0–33.0)
MCHC: 35 g/dL (ref 32.0–36.0)
MCV: 99.3 fL (ref 80.0–100.0)
MPV: 10.2 fL (ref 7.5–12.5)
Monocytes Relative: 10.8 %
Neutro Abs: 2512 cells/uL (ref 1500–7800)
Neutrophils Relative %: 57.1 %
Platelets: 227 10*3/uL (ref 140–400)
RBC: 4.29 10*6/uL (ref 3.80–5.10)
RDW: 13.5 % (ref 11.0–15.0)
Total Lymphocyte: 28.7 %
WBC: 4.4 10*3/uL (ref 3.8–10.8)

## 2022-08-11 LAB — COMPLETE METABOLIC PANEL WITH GFR
AG Ratio: 1.4 (calc) (ref 1.0–2.5)
ALT: 13 U/L (ref 6–29)
AST: 18 U/L (ref 10–35)
Albumin: 4.2 g/dL (ref 3.6–5.1)
Alkaline phosphatase (APISO): 60 U/L (ref 37–153)
BUN: 15 mg/dL (ref 7–25)
CO2: 24 mmol/L (ref 20–32)
Calcium: 9.9 mg/dL (ref 8.6–10.4)
Chloride: 105 mmol/L (ref 98–110)
Creat: 0.85 mg/dL (ref 0.60–0.95)
Globulin: 2.9 g/dL (calc) (ref 1.9–3.7)
Glucose, Bld: 83 mg/dL (ref 65–99)
Potassium: 4.3 mmol/L (ref 3.5–5.3)
Sodium: 140 mmol/L (ref 135–146)
Total Bilirubin: 1 mg/dL (ref 0.2–1.2)
Total Protein: 7.1 g/dL (ref 6.1–8.1)
eGFR: 68 mL/min/{1.73_m2} (ref >=60)

## 2022-08-11 LAB — LIPID PANEL
Cholesterol: 167 mg/dL (ref <200)
HDL: 62 mg/dL (ref >=50)
LDL Cholesterol (Calc): 83 mg/dL (calc)
Non-HDL Cholesterol (Calc): 105 mg/dL (calc) (ref <130)
Total CHOL/HDL Ratio: 2.7 (calc) (ref <5.0)
Triglycerides: 119 mg/dL (ref <150)

## 2022-08-11 LAB — VITAMIN D 25 HYDROXY (VIT D DEFICIENCY, FRACTURES): Vit D, 25-Hydroxy: 45 ng/mL (ref 30–100)

## 2022-08-11 LAB — HEMOGLOBIN A1C
Hgb A1c MFr Bld: 5.4 % of total Hgb (ref <5.7)
Mean Plasma Glucose: 108 mg/dL
eAG (mmol/L): 6 mmol/L

## 2022-08-17 NOTE — Telephone Encounter (Signed)
Prolia VOB initiated via [email or web address]  Last Prolia inj 03/15/22 Next Prolia inj due 09/16/22

## 2022-08-28 NOTE — Telephone Encounter (Signed)
Prior auth renewal required for PROLIA  PA PROCESS DETAILS: PA is required. PA can be initiated by calling [phone] or online at [email or web address]   

## 2022-09-01 NOTE — Telephone Encounter (Signed)
Prior Authorization initiated for PROLIA via [email or web address] KEYSallyanne Kuster, PA Case ID #: [id number]

## 2022-09-06 NOTE — Telephone Encounter (Signed)
PA# [id number] Valid:01/24/22-07/31/23

## 2022-09-07 NOTE — Telephone Encounter (Signed)
Pt ready for scheduling on or after 09/16/22  Out-of-pocket cost due at time of visit: $0  Primary: [employer] [place] Prolia co-insurance: 0% Admin fee co-insurance: 0%   Secondary: n/a Prolia co-insurance:  Admin fee co-insurance:   Deductible: does not apply  Prior Auth: APPROVED PA# [id number] Valid:01/24/22-07/31/23      ** This summary of benefits is an estimation of the patient's out-of-pocket cost. Exact cost may very based on individual plan coverage.

## 2022-09-26 ENCOUNTER — Other Ambulatory Visit: Payer: Self-pay

## 2022-09-26 ENCOUNTER — Encounter: Payer: Self-pay | Admitting: Hematology and Oncology

## 2022-09-26 ENCOUNTER — Inpatient Hospital Stay: Payer: Medicare PPO | Admitting: Hematology and Oncology

## 2022-09-26 ENCOUNTER — Inpatient Hospital Stay: Payer: Medicare PPO | Attending: Hematology and Oncology

## 2022-09-26 VITALS — BP 163/91 | HR 68 | Temp 98.2°F | Resp 18 | Ht 63.0 in | Wt 183.4 lb

## 2022-09-26 DIAGNOSIS — I2602 Saddle embolus of pulmonary artery with acute cor pulmonale: Secondary | ICD-10-CM | POA: Diagnosis not present

## 2022-09-26 DIAGNOSIS — Z86718 Personal history of other venous thrombosis and embolism: Secondary | ICD-10-CM | POA: Insufficient documentation

## 2022-09-26 DIAGNOSIS — Z86711 Personal history of pulmonary embolism: Secondary | ICD-10-CM | POA: Insufficient documentation

## 2022-09-26 DIAGNOSIS — Z7901 Long term (current) use of anticoagulants: Secondary | ICD-10-CM | POA: Diagnosis not present

## 2022-09-26 DIAGNOSIS — I82402 Acute embolism and thrombosis of unspecified deep veins of left lower extremity: Secondary | ICD-10-CM

## 2022-09-26 DIAGNOSIS — K250 Acute gastric ulcer with hemorrhage: Secondary | ICD-10-CM

## 2022-09-26 DIAGNOSIS — D471 Chronic myeloproliferative disease: Secondary | ICD-10-CM

## 2022-09-26 DIAGNOSIS — Z79899 Other long term (current) drug therapy: Secondary | ICD-10-CM | POA: Diagnosis not present

## 2022-09-26 LAB — CBC WITH DIFFERENTIAL/PLATELET
Abs Immature Granulocytes: 0.01 10*3/uL (ref 0.00–0.07)
Basophils Absolute: 0.1 10*3/uL (ref 0.0–0.1)
Basophils Relative: 1 %
Eosinophils Absolute: 0.1 10*3/uL (ref 0.0–0.5)
Eosinophils Relative: 2 %
HCT: 43.9 % (ref 36.0–46.0)
Hemoglobin: 14.7 g/dL (ref 12.0–15.0)
Immature Granulocytes: 0 %
Lymphocytes Relative: 32 %
Lymphs Abs: 1.6 10*3/uL (ref 0.7–4.0)
MCH: 34.8 pg — ABNORMAL HIGH (ref 26.0–34.0)
MCHC: 33.5 g/dL (ref 30.0–36.0)
MCV: 104 fL — ABNORMAL HIGH (ref 80.0–100.0)
Monocytes Absolute: 0.5 10*3/uL (ref 0.1–1.0)
Monocytes Relative: 9 %
Neutro Abs: 2.7 10*3/uL (ref 1.7–7.7)
Neutrophils Relative %: 56 %
Platelets: 198 10*3/uL (ref 150–400)
RBC: 4.22 MIL/uL (ref 3.87–5.11)
RDW: 13.2 % (ref 11.5–15.5)
WBC: 5 10*3/uL (ref 4.0–10.5)
nRBC: 0 % (ref 0.0–0.2)

## 2022-09-26 MED ORDER — APIXABAN 2.5 MG PO TABS: ORAL_TABLET | ORAL | 1 refills | Status: DC

## 2022-09-26 NOTE — Progress Notes (Signed)
[place] OFFICE PROGRESS NOTE  Patient Care Team: Unk Pinto, MD as PCP - General (Internal Medicine) Monna Fam, MD as Consulting Physician (Ophthalmology) Gatha Mayer, MD as Consulting Physician (Gastroenterology) Kathie Rhodes, MD (Inactive) as Consulting Physician (Urology) Meisinger, Sherren Mocha, MD as Consulting Physician (Obstetrics and Gynecology)  ASSESSMENT & PLAN:  Myeloproliferative disorder [facility]) She is compliant taking medication as directed With adequate hydration, her CBC is back to normal She will continue current dose of hydroxyurea without changes I plan to see her again in 4 months for further follow-up  Acute saddle pulmonary embolism with acute cor pulmonale [facility]) The patient has completed a years worth of treatment for history of DVT and PE. IVC filter has been removed. 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   All questions were answered. The patient knows to call the clinic with any problems, questions or concerns. The total time spent in the appointment was 20 minutes encounter with patients including review of chart and various tests results, discussions about plan of care and coordination of care plan   [name], MD 09/26/2022 3:01 PM  INTERVAL HISTORY: Please see below for problem oriented charting. she returns for treatment follow-up on hydroxyurea for myeloproliferative disorder and Eliquis for secondary prevention against risk of DVT/PE She is doing well She is compliant taking medications as directed No recent bleeding  REVIEW OF SYSTEMS:   Constitutional: Denies fevers, chills or abnormal weight loss Eyes: Denies blurriness of vision Ears, nose, mouth, throat, and face: Denies mucositis or sore throat Respiratory: Denies cough, dyspnea or  wheezes Cardiovascular: Denies palpitation, chest discomfort or lower extremity swelling Gastrointestinal:  Denies nausea, heartburn or change in bowel habits Skin: Denies abnormal skin rashes Lymphatics: Denies new lymphadenopathy or easy bruising Neurological:Denies numbness, tingling or new weaknesses Behavioral/Psych: Mood is stable, no new changes  All other systems were reviewed with the patient and are negative.  I have reviewed the past medical history, past surgical history, social history and family history with the patient and they are unchanged from previous note.  ALLERGIES:  is allergic to evista [raloxifene] and fosamax [alendronate sodium].  MEDICATIONS:  Current Outpatient Medications  Medication Sig Dispense Refill   apixaban (ELIQUIS) 2.5 MG TABS tablet TAKE 1 TABLET BY MOUTH TWICE A DAY TO PREVENT BLOOD CLOTS 180 tablet 1   calcium carbonate (OS-CAL) 600 MG TABS tablet Take 600 mg by mouth daily with breakfast.      Cholecalciferol (DIALYVITE VITAMIN D 5000) 125 MCG (5000 UT) capsule Take 5,000 Units by mouth daily. Take Monday thru Saturday only.     esomeprazole (NEXIUM) 40 MG capsule Take  1 capsule  Daily  to Prevent Heartburn & Indigestion 90 capsule 3   hydroxyurea (HYDREA) 500 MG capsule TAKE 1 CAPSULE DAILY EXCEPT ON MONDAYS, WEDNESDAYS, FRIDAYS AND SATURDAYS, TAKE 2 CAPSULES 270 capsule 1   MAGNESIUM PO Take 500 mg by mouth daily.     metoprolol succinate (TOPROL-XL) 100 MG 24 hr tablet Take  1 tablet  Daily  for BP                                                       /  TAKE                                     BY                             MOUTH 90 tablet 3   polyvinyl alcohol (LIQUIFILM TEARS) 1.4 % ophthalmic solution Place 1 drop into both eyes as needed for dry eyes.     sodium chloride (OCEAN) 0.65 % SOLN nasal spray Place 1 spray into both nostrils as needed for congestion.     vitamin C (ASCORBIC  ACID) 500 MG tablet Take 500 mg by mouth daily. With meal     No current facility-administered medications for this visit.    SUMMARY OF ONCOLOGIC HISTORY:  AMADI IGO was referred here after recent diagnosis of severe left lower extremity DVT with bilateral PE and cor pulmonale. I reviewed her records extensively and collaborated the history with the patient. The patient has been taking Evista for over 25 years due to strong family history of breast cancer and for osteoporosis management. In January 2017, she had viral gastroenteritis with severe diarrhea and dehydration. The patient has been taking Lasix chronically for mild bilateral lower extremity edema, left greater than the right. 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 She recalled having profuse diarrhea nonstop for 24 hours leading up to the blood clot event. Of note, prior to the diagnosis, she was not taking aspirin consistently  Prior to that she denies recent history of trauma, long distance travel, recent surgery, smoking or prolonged immobilization. She had no prior history or diagnosis of cancer. Her age appropriate screening programs are up-to-date. She had prior surgeries before and never had perioperative thromboembolic events. The patient had been exposed to birth control pills and never had thrombotic events. The patient had been pregnant before and denies history of peripartum thromboembolic event or history of recurrent miscarriages. There is no family history of blood clots or miscarriages.  On 08/18/2015, she presented to the emergency department due to symptoms of severe weakness, chest pressure, shortness of breath and near syncopal episode. She had extensive evaluation in the emergency room. D-dimer was elevated at greater than 20.  CT angiogram dated 08/19/2015 show massive saddle pulmonary embolus with evidence of severe right heart strain.  Left lower  extremity venous Doppler on 08/19/2015 showed findings consistent with acute deep vein thrombosis involving the left femoral vein, left popliteal vein, left posterior tibial vein and left peroneal vein.  Echocardiogram on 08/19/2015 show preserved ejection fraction but with right ventricular dilatation, moderate tricuspid valvular regurgitation, oscillating density on TV(thought to be blood clot in transit) and moderate to severe increased right systolic pressure/evidence of severe pulmonary hypertension  The patient was admitted with IV heparin anticoagulation therapy and placement of IVC filter on 08/20/2015.  Echocardiogram was repeated on 08/23/2015 which showed persistent elevated pulmonary hypertension. The mass over the right tricuspid valve is no longer visible She was hospitalized on 08/18/2015 to 08/24/2015. She was discharged home on Eliquis.  Evista was discontinued. 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  Her anticoagulation was placed on hold recently and she developed acute DVT.  She had IVC filter placed on 11/19/2020 On 11/20/2020, she underwent repeat EGD due to GI bleed which showed - Z-line regular, 36 cm from the incisors. - No gross lesions in esophagus. - Non-bleeding gastric ulcer with a clean ulcer base (Forrest Class III). - Normal examined duodenum. - No specimens collected. On Dec 09, 2020, she resume Hydrea On February 28, 2021, the dose of hydroxyurea is changed to 500 mg daily except on Sundays she will take 1000 mg by mouth On July 12, 2021, the dose of hydroxyurea is  changed to 1000 mg 4 days a week and 500 mg the rest of the week  PHYSICAL EXAMINATION: ECOG PERFORMANCE STATUS: 0 - Asymptomatic  Vitals:   09/26/22 1141  BP: (!) 163/91  Pulse: 68  Resp: 18  Temp: 98.2 F (36.8 C)  SpO2: 95%   Filed Weights   09/26/22 1141  Weight: 183 lb 6.4 oz (83.2 kg)    GENERAL:alert, no distress and comfortable NEURO: alert & oriented x 3 with fluent speech, no focal motor/sensory deficits  LABORATORY DATA:  I have reviewed the data as listed    Component Value Date/Time   NA 140 08/10/2022 0000   K 4.3 08/10/2022 0000   CL 105 08/10/2022 0000   CO2 24 08/10/2022 0000   GLUCOSE 83 08/10/2022 0000   BUN 15 08/10/2022 0000   CREATININE 0.85 08/10/2022 0000   CALCIUM 9.9 08/10/2022 0000   PROT 7.1 08/10/2022 0000   ALBUMIN 4.0 12/09/2020 1219   AST 18 08/10/2022 0000   ALT 13 08/10/2022 0000   ALKPHOS 70 12/09/2020 1219   BILITOT 1.0 08/10/2022 0000   GFRNONAA 56 (L) 12/09/2020 1219   GFRNONAA 61 08/04/2020 1549   GFRAA 71 08/04/2020 1549    No results found for: "SPEP", "UPEP"  Lab Results  Component Value Date   WBC 5.0 09/26/2022   NEUTROABS 2.7 09/26/2022   HGB 14.7 09/26/2022   HCT 43.9 09/26/2022   MCV 104.0 (H) 09/26/2022   PLT 198 09/26/2022      Chemistry      Component Value Date/Time   NA 140 08/10/2022 0000   K 4.3 08/10/2022 0000   CL 105 08/10/2022 0000   CO2 24 08/10/2022 0000   BUN 15 08/10/2022 0000   CREATININE 0.85 08/10/2022 0000      Component Value Date/Time   CALCIUM 9.9 08/10/2022 0000   ALKPHOS 70 12/09/2020 1219   AST 18 08/10/2022 0000   ALT 13 08/10/2022 0000   BILITOT 1.0 08/10/2022 0000

## 2022-09-26 NOTE — Assessment & Plan Note (Signed)
She is compliant taking medication as directed With adequate hydration, her CBC is back to normal She will continue current dose of hydroxyurea without changes I plan to see her again in 4 months for further follow-up

## 2022-09-26 NOTE — Assessment & Plan Note (Signed)
The patient has completed a years worth of treatment for history of DVT and PE. IVC filter has been removed. 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

## 2022-09-28 DIAGNOSIS — Z1231 Encounter for screening mammogram for malignant neoplasm of breast: Secondary | ICD-10-CM | POA: Diagnosis not present

## 2022-09-28 LAB — HM MAMMOGRAPHY

## 2022-10-05 ENCOUNTER — Other Ambulatory Visit: Payer: Self-pay | Admitting: Hematology and Oncology

## 2022-10-05 ENCOUNTER — Telehealth: Payer: Self-pay | Admitting: Internal Medicine

## 2022-10-05 NOTE — Telephone Encounter (Signed)
We were calling patient to schedule next Prolia injection and patient states that her left wrist and had (up to knuckles) swelled and stayed swollen for a few days.  Patient states that she did not contact office concerning the swelling.  Patient is wanting to know if she should go ahead and schedule her next Prolia injection or speak with clinical staff concerning the swelling.

## 2022-10-05 NOTE — Telephone Encounter (Signed)
LM to schedule Prolia

## 2022-10-05 NOTE — Telephone Encounter (Signed)
Kimberly Watson, I am not sure about the details of this, e.g. can this be an infection?, but if so, we may need to delay the Prolia treatment.  I would like her to see PCP first if she feels that this is not getting better or the swelling can be due to an infection.  If none of these two, we could probably go ahead with the treatment.

## 2022-10-06 NOTE — Telephone Encounter (Signed)
Patient advised and will move for with treatment as she is currently not having any swelling

## 2022-10-10 ENCOUNTER — Encounter: Payer: Self-pay | Admitting: Internal Medicine

## 2022-10-11 ENCOUNTER — Ambulatory Visit (INDEPENDENT_AMBULATORY_CARE_PROVIDER_SITE_OTHER): Payer: Medicare PPO

## 2022-10-11 VITALS — BP 118/70 | HR 69 | Ht 63.0 in | Wt 185.2 lb

## 2022-10-11 DIAGNOSIS — M810 Age-related osteoporosis without current pathological fracture: Secondary | ICD-10-CM

## 2022-10-11 MED ORDER — DENOSUMAB 60 MG/ML ~~LOC~~ SOSY
60.0000 mg | PREFILLED_SYRINGE | Freq: Once | SUBCUTANEOUS | Status: AC
  Administered 2022-10-11: 60 mg via SUBCUTANEOUS

## 2022-10-11 NOTE — Progress Notes (Signed)
Patient verbally confirmed name, date of birth, and correct medication to be administered. Prolia injection administered and pt tolerated well.  

## 2022-10-23 NOTE — Telephone Encounter (Signed)
Last Prolia inj 10/11/22 Next Prolia inj due 04/14/23

## 2022-11-13 ENCOUNTER — Ambulatory Visit: Payer: Medicare PPO | Admitting: Internal Medicine

## 2022-12-05 ENCOUNTER — Encounter: Payer: Self-pay | Admitting: Nurse Practitioner

## 2022-12-05 ENCOUNTER — Ambulatory Visit: Payer: Medicare PPO | Admitting: Nurse Practitioner

## 2022-12-05 VITALS — BP 158/98 | HR 74 | Temp 97.3°F | Ht 63.0 in | Wt 182.4 lb

## 2022-12-05 DIAGNOSIS — I10 Essential (primary) hypertension: Secondary | ICD-10-CM | POA: Diagnosis not present

## 2022-12-05 DIAGNOSIS — N200 Calculus of kidney: Secondary | ICD-10-CM

## 2022-12-05 DIAGNOSIS — E782 Mixed hyperlipidemia: Secondary | ICD-10-CM

## 2022-12-05 DIAGNOSIS — E669 Obesity, unspecified: Secondary | ICD-10-CM

## 2022-12-05 DIAGNOSIS — D471 Chronic myeloproliferative disease: Secondary | ICD-10-CM

## 2022-12-05 DIAGNOSIS — M810 Age-related osteoporosis without current pathological fracture: Secondary | ICD-10-CM

## 2022-12-05 DIAGNOSIS — K219 Gastro-esophageal reflux disease without esophagitis: Secondary | ICD-10-CM

## 2022-12-05 DIAGNOSIS — E559 Vitamin D deficiency, unspecified: Secondary | ICD-10-CM

## 2022-12-05 DIAGNOSIS — R7309 Other abnormal glucose: Secondary | ICD-10-CM

## 2022-12-05 DIAGNOSIS — Z79899 Other long term (current) drug therapy: Secondary | ICD-10-CM | POA: Diagnosis not present

## 2022-12-05 DIAGNOSIS — M1711 Unilateral primary osteoarthritis, right knee: Secondary | ICD-10-CM

## 2022-12-05 DIAGNOSIS — Z86718 Personal history of other venous thrombosis and embolism: Secondary | ICD-10-CM

## 2022-12-05 LAB — CBC WITH DIFFERENTIAL/PLATELET
Basophils Relative: 1 %
Lymphs Abs: 1450 cells/uL (ref 850–3900)
MCHC: 34.1 g/dL (ref 32.0–36.0)
Monocytes Relative: 8.6 %
Neutrophils Relative %: 58.3 %

## 2022-12-05 MED ORDER — ESOMEPRAZOLE MAGNESIUM 40 MG PO CPDR: DELAYED_RELEASE_CAPSULE | ORAL | 3 refills | Status: DC

## 2022-12-05 NOTE — Progress Notes (Signed)
Follow Up  Assessment:   Essential hypertension Discussed DASH (Dietary Approaches to Stop Hypertension) DASH diet is lower in sodium than a typical American diet. Cut back on foods that are high in saturated fat, cholesterol, and trans fats. Eat more whole-grain foods, fish, poultry, and nuts Remain active and exercise as tolerated daily.  Monitor BP at home-Call if greater than 130/80.  Check CMP/CBC  Mixed hyperlipidemia Discussed lifestyle modifications. Recommended diet heavy in fruits and veggies, omega 3's. Decrease consumption of animal meats, cheeses, and dairy products. Remain active and exercise as tolerated. Continue to monitor. Check lipids/TSH   Other abnormal glucose Recent A1Cs at goal Discussed diet/exercise, weight management  Defer A1C; check CMP  Osteoporosis Continue CA and vitamin D Dr. Elvera Lennox following for [facility] a combination of weight-bearing exercises and strength training. Advised on fall prevention measures including proper lighting in all rooms, removal of area rugs and floor clutter, use of walking devices as deemed appropriate, avoidance of uneven walking surfaces. Smoking cessation and moderate alcohol consumption if applicable Consume 800 to 1000 IU of vitamin D daily with a goal vitamin D serum value of 30 ng/mL or higher. Aim for 1000 to 1200 mg of elemental calcium daily through supplements and/or dietary sources.   Vitamin D deficiency At goal at recent check; continue to recommend supplementation for goal of 60-100 Monitor levels  Medication management All medications discussed and reviewed in full. All questions and concerns regarding medications addressed.    Nephrolithiasis Stay well hydrated Continue to monitor  GERD/hiatal hernia No suspected reflux complications (Barret/stricture). Lifestyle modification:  wt loss, avoid meals 2-3h before bedtime. Consider eliminating food triggers:  chocolate, caffeine, EtOH,  acid/spicy food.  Obesity - BMI 30 Discussed appropriate BMI Diet modification. Physical activity. Encouraged/praised to build confidence.   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 Bertis Ruddy is following Continue to monitor  Myeloproliferative disorder [facility]) Dr. Bertis Ruddy following, good response on hydroxyurea  R knee arthritis Currently Managing with tylenol/rest  Dr. Lequita Halt follows but high risk for surgery- deferring - requesting steroid injections. Continue regular exercise for strength and balance  Orders Placed This Encounter  Procedures   CBC with Differential/Platelet   COMPLETE METABOLIC PANEL WITH GFR   Lipid panel   Hemoglobin A1c   VITAMIN D 25 Hydroxy (Vit-D Deficiency, Fractures)   Ambulatory referral to Orthopedic Surgery    Referral Priority:   Routine    Referral Type:   Surgical    Referral Reason:   Specialty Services Required    Requested Specialty:   Orthopedic Surgery    Number of Visits Requested:   1  .  Notify office for further evaluation and treatment, questions or concerns if any reported s/s fail to improve.   The patient was advised to call back or seek an in-person evaluation if any symptoms worsen or if the condition fails to improve as anticipated.   Further disposition pending results of labs. Discussed med's effects and SE's.    I discussed the assessment and treatment plan with the patient. The patient was provided an opportunity to ask questions and all were answered. The patient agreed with the plan and demonstrated an understanding of the instructions.  Discussed med's effects and SE's. Screening labs and tests as requested with regular follow-up as recommended.  I provided 25 minutes of face-to-face time during this encounter including counseling, chart review, and critical decision making was preformed.  Today's Plan  of Care is based on a  patient-centered health care approach known as shared decision making - the decisions, tests and treatments allow for patient preferences and values to be balanced with clinical evidence.     Future Appointments  Date Time Provider Department Center  12/11/2022  3:00 PM Carlus Pavlov, MD [facility] None  01/23/2023 11:30 AM [facility] None  01/23/2023 12:00 PM Artis Delay, MD [facility] None  03/13/2023 11:00 AM [name], MD [facility] None  08/13/2023 11:00 AM Adela Glimpse, NP [facility] None    Subjective:   Kimberly Watson is a 82 y.o. female who presents for a 3 month follow up. She has Essential hypertension; Hyperlipidemia, mixed; Vitamin D deficiency; GERD (gastroesophageal reflux disease); Nephrolithiasis; Osteoporosis; Medication management; Obesity (BMI 30.0-34.9); History of recurrent deep vein thrombosis (DVT); Hiatal hernia; Diverticulosis; Osteoarthritis of right knee; Myeloproliferative disorder (HCC); 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  Overall she reports feeling well.  Has no new concerns to address at this time.  Was [facility] by [facility] 12/14/21 for 1 mo post-op Pseudophakia (new lens).  1 mo post op from CE/IOL.  She was instructed to d/c the inflammatory meds. She was provided a new glass Rx.  She was delighted with her new vision. She was to f/u in 6 mo 05/2022 Dr. Elmer Picker.  Overall feeling well.  She has R knee bone on bone arthritis, follows with Dr. Lequita Halt, intermittently uses cane at home if needed for instability but reports limited pain. Has discussed surgical intervention but no longer a candidate. She feels steroid injections may be an option and would like to pursue.   She was on evista for osteoporosis, she was taken off due to submassive bilateral saddle PE with right heart strain in 2017. Has had recurrent DVT/PEs. She  follows with Dr. Bertis Ruddy, chronically on eliquis 5 mg BID. She was also diagnosed with myeloproliferative disorder, has therapeutic phlebotomies on hydroxyurea with apparent good response. She follows as directed.  Currently being treated with Hydrea.  Now follows with Dr. Elvera Lennox for osteoporosis, doing prolia. The DEXA 07/2019 showed  T-3.5 latest in 08/2021 with improved T-Score of -2.5.  She has history of nephrolithiasis, follows with Dr. Vernie Ammons.   Denies any recent flares.   GERD doing well with lifestyle modification. She does have hiatal hernia, hx of esophageal stricture, had dilation x 3, last in 2019.   BMI is Body mass index is 32.31 kg/m., she has not been working on diet and exercise.  Wt Readings from Last 3 Encounters:  12/05/22 182 lb 6.4 oz (82.7 kg)  10/11/22 185 lb 3.2 oz (84 kg)  09/26/22 183 lb 6.4 oz (83.2 kg)   Her blood pressure has been controlled at home (intermittently elevated due to myeloproliferative disorder), today their BP is BP: (!) 158/98  She denies chest pain, shortness of breath, dizziness.   Her cholesterol is diet controlled.  Her cholesterol is controlled. The cholesterol last visit was:   Lab Results  Component Value Date   CHOL 167 08/10/2022   HDL 62 08/10/2022   LDLCALC 83 08/10/2022   TRIG 119 08/10/2022   CHOLHDL 2.7 08/10/2022   She has been working on diet and exercise for glucose management, and has no polyuria or polydipsia, no chest pain, dyspnea or TIAs, no numbness, tingling or pain in extremities. Last A1C in the office was:  Lab Results  Component Value  Date   HGBA1C 5.4 08/10/2022   She is working to get in 64 fluid ounces daily. Last GFR:  Lab Results  Component Value Date   GFRNONAA 56 (L) 12/09/2020   Patient is on Vitamin D supplement.  Lab Results  Component Value Date   VD25OH 45 08/10/2022        Medication Review Current Outpatient Medications on File Prior to Visit  Medication Sig Dispense Refill    apixaban (ELIQUIS) 2.5 MG TABS tablet TAKE 1 TABLET BY MOUTH TWICE A DAY TO PREVENT BLOOD CLOTS 180 tablet 1   calcium carbonate (OS-CAL) 600 MG TABS tablet Take 1,200 mg by mouth daily with breakfast.     Cholecalciferol (DIALYVITE VITAMIN D 5000) 125 MCG (5000 UT) capsule Take 5,000 Units by mouth daily. Take Monday thru Saturday only.     esomeprazole (NEXIUM) 40 MG capsule Take  1 capsule  Daily  to Prevent Heartburn & Indigestion 90 capsule 3   hydroxyurea (HYDREA) 500 MG capsule TAKE 1 CAPSULE DAILY EXCEPT ON MONDAYS, WEDNESDAYS, FRIDAYS AND SATURDAYS, TAKE 2 CAPSULES 270 capsule 1   MAGNESIUM PO Take 500 mg by mouth daily.     metoprolol succinate (TOPROL-XL) 100 MG 24 hr tablet Take  1 tablet  Daily  for BP                                                       /                                                       TAKE                                     BY                             MOUTH 90 tablet 3   polyvinyl alcohol (LIQUIFILM TEARS) 1.4 % ophthalmic solution Place 1 drop into both eyes as needed for dry eyes.     sodium chloride (OCEAN) 0.65 % SOLN nasal spray Place 1 spray into both nostrils as needed for congestion.     vitamin C (ASCORBIC ACID) 500 MG tablet Take 500 mg by mouth daily. With meal     No current facility-administered medications on file prior to visit.    Current Problems (verified) Patient Active Problem List   Diagnosis Date Noted   History of esophageal dilatation 11/02/2020   Poor vision 10/02/2019   Abnormal glucose 09/18/2019   Chronic anticoagulation (apixaban) due to DVT/PE 10/08/2017   Myeloproliferative disorder (HCC) 08/27/2017   Hiatal hernia 08/09/2017   Diverticulosis 08/09/2017   Osteoarthritis of right knee 08/09/2017   History of recurrent deep vein thrombosis (DVT)    Osteoporosis 03/30/2015   Medication management 03/30/2015   Obesity (BMI 30.0-34.9) 03/30/2015   Essential hypertension 06/18/2013   Hyperlipidemia, mixed 06/18/2013    Vitamin D deficiency 06/18/2013   GERD (gastroesophageal reflux disease) 06/18/2013   Nephrolithiasis 06/18/2013   Screening Tests Immunization History  Administered Date(s) Administered  DT (Pediatric) 03/30/2015   Fluad Quad(high Dose 65+) 07/01/2020   Influenza, High Dose Seasonal PF 04/20/2016, 05/02/2017, 05/20/2018, 05/28/2021, 05/06/2022   PFIZER Comirnaty(Gray Top)Covid-19 Tri-Sucrose Vaccine 12/10/2020   PFIZER(Purple Top)SARS-COV-2 Vaccination 09/13/2019, 10/06/2019, 06/15/2020   Pneumococcal Conjugate-13 03/25/2014   Tdap 12/27/2018   Zoster, Live 12/27/2005   Names of Other Physician/Practitioners you currently use: 1. [facility]- here for primary care 2. Dr.Hecker, eye doctor, last visit Glasses/contacts, last 2021, has follow up planned Feb 2023 3. [facility], last visit 2023, every 6 months  Patient Care Team: [name], MD as PCP - General (Internal Medicine) Mateo Flow, MD as Consulting Physician (Ophthalmology) Iva Boop, MD as Consulting Physician (Gastroenterology) Ihor Gully, MD (Inactive) as Consulting Physician (Urology) Lavina Hamman, MD as Consulting Physician (Obstetrics and Gynecology)   Allergies Allergies  Allergen Reactions   Evista [Raloxifene] Other (See Comments)    Blood clots - DVT and Pulmonary Embolism    Fosamax [Alendronate Sodium]     esophagitis    SURGICAL HISTORY She  has a past surgical history that includes Joint replacement (Left, total knee); Uterine fibroid surgery; Cholecystectomy; IVC FILTER PLACEMENT ([facility] HX) (08/2015); Tonsillectomy; cyst removed off spine; IR IVC FILTER PLMT / S&I /IMG GUID/MOD SED (11/19/2020); and Esophagogastroduodenoscopy (egd) with propofol (N/A, 11/20/2020). FAMILY HISTORY Her family history includes Breast cancer in her mother; Congestive Heart Failure in her mother; Diabetes in her mother; Hypertension in her mother; Osteoporosis in her  father. SOCIAL HISTORY She  reports that she has never smoked. She has never used smokeless tobacco. She reports that she does not drink alcohol and does not use drugs.  Review of Systems  Constitutional:  Negative for malaise/fatigue and weight loss.  HENT:  Negative for hearing loss and tinnitus.   Eyes:  Negative for blurred vision and double vision.  Respiratory:  Negative for cough, sputum production, shortness of breath and wheezing.   Cardiovascular:  Negative for chest pain, palpitations, orthopnea, claudication, leg swelling and PND.  Gastrointestinal:  Negative for abdominal pain, blood in stool, constipation, diarrhea, heartburn, melena, nausea and vomiting.  Genitourinary: Negative.   Musculoskeletal:  Positive for joint pain (R knee, postponing surgery ). Negative for back pain, falls and myalgias.  Skin:  Negative for rash.  Neurological:  Negative for dizziness, tingling, sensory change, weakness and headaches.  Endo/Heme/Allergies:  Negative for polydipsia.  Psychiatric/Behavioral: Negative.  Negative for depression, memory loss, substance abuse and suicidal ideas. The patient is not nervous/anxious and does not have insomnia.   All other systems reviewed and are negative.    Objective:   Blood pressure (!) 158/98, pulse 74, temperature (!) 97.3 F (36.3 C), height 5\' 3"  (1.6 m), weight 182 lb 6.4 oz (82.7 kg), SpO2 93 %. Body mass index is 32.31 kg/m.  General appearance: alert, no distress, cane, WD/WN,  female HEENT: normocephalic, sclerae anicteric, R canal obstructed by hard wax, L clear, L TM pearly, nares patent, no discharge or erythema, pharynx normal Oral cavity: MMM, no lesions Neck: supple, no lymphadenopathy, no thyromegaly, no masses Heart: RRR, normal S1, S2, no murmurs Lungs: CTA bilaterally, no wheezes, rhonchi, or rales Abdomen: +bs, soft, non tender, non distended, no masses, no hepatomegaly, no splenomegaly Musculoskeletal: nontender, mild  swelling to right knee, no obvious deformity,  Extremities: no edema, no cyanosis, no clubbing Pulses: 2+ symmetric, upper and lower extremities, normal cap refill Neurological: alert, oriented x 3, CN2-12 intact, strength normal upper extremities and lower extremities,  sensation normal throughout, DTRs 2+ throughout, no cerebellar signs, gait antalgic Psychiatric: normal affect, behavior normal, pleasant   Adela Glimpse, NP   12/05/2022

## 2022-12-05 NOTE — Patient Instructions (Signed)

## 2022-12-06 LAB — CBC WITH DIFFERENTIAL/PLATELET
Absolute Monocytes: 413 cells/uL (ref 200–950)
Basophils Absolute: 48 cells/uL (ref 0–200)
Eosinophils Absolute: 91 cells/uL (ref 15–500)
Eosinophils Relative: 1.9 %
HCT: 43.7 % (ref 35.0–45.0)
Hemoglobin: 14.9 g/dL (ref 11.7–15.5)
MCH: 34.2 pg — ABNORMAL HIGH (ref 27.0–33.0)
MCV: 100.2 fL — ABNORMAL HIGH (ref 80.0–100.0)
MPV: 10.6 fL (ref 7.5–12.5)
Neutro Abs: 2798 cells/uL (ref 1500–7800)
Platelets: 216 10*3/uL (ref 140–400)
RBC: 4.36 10*6/uL (ref 3.80–5.10)
RDW: 13.3 % (ref 11.0–15.0)
Total Lymphocyte: 30.2 %
WBC: 4.8 10*3/uL (ref 3.8–10.8)

## 2022-12-06 LAB — COMPLETE METABOLIC PANEL WITH GFR
AG Ratio: 1.5 (calc) (ref 1.0–2.5)
ALT: 12 U/L (ref 6–29)
AST: 18 U/L (ref 10–35)
Albumin: 4.3 g/dL (ref 3.6–5.1)
Alkaline phosphatase (APISO): 61 U/L (ref 37–153)
BUN: 21 mg/dL (ref 7–25)
CO2: 26 mmol/L (ref 20–32)
Calcium: 9.7 mg/dL (ref 8.6–10.4)
Chloride: 104 mmol/L (ref 98–110)
Creat: 0.76 mg/dL (ref 0.60–0.95)
Globulin: 2.8 g/dL (calc) (ref 1.9–3.7)
Glucose, Bld: 94 mg/dL (ref 65–99)
Potassium: 4.6 mmol/L (ref 3.5–5.3)
Sodium: 139 mmol/L (ref 135–146)
Total Bilirubin: 1.1 mg/dL (ref 0.2–1.2)
Total Protein: 7.1 g/dL (ref 6.1–8.1)
eGFR: 78 mL/min/{1.73_m2} (ref >=60)

## 2022-12-06 LAB — LIPID PANEL
Cholesterol: 162 mg/dL (ref <200)
HDL: 54 mg/dL (ref >=50)
LDL Cholesterol (Calc): 80 mg/dL (calc)
Non-HDL Cholesterol (Calc): 108 mg/dL (calc) (ref <130)
Total CHOL/HDL Ratio: 3 (calc) (ref <5.0)
Triglycerides: 179 mg/dL — ABNORMAL HIGH (ref <150)

## 2022-12-06 LAB — VITAMIN D 25 HYDROXY (VIT D DEFICIENCY, FRACTURES): Vit D, 25-Hydroxy: 50 ng/mL (ref 30–100)

## 2022-12-06 LAB — HEMOGLOBIN A1C
Hgb A1c MFr Bld: 5.5 % of total Hgb (ref <5.7)
Mean Plasma Glucose: 111 mg/dL
eAG (mmol/L): 6.2 mmol/L

## 2022-12-11 ENCOUNTER — Ambulatory Visit: Payer: Medicare PPO | Admitting: Internal Medicine

## 2022-12-11 ENCOUNTER — Encounter: Payer: Self-pay | Admitting: Internal Medicine

## 2022-12-11 VITALS — BP 120/82 | HR 71 | Ht 63.0 in | Wt 183.6 lb

## 2022-12-11 DIAGNOSIS — M810 Age-related osteoporosis without current pathological fracture: Secondary | ICD-10-CM

## 2022-12-11 DIAGNOSIS — E673 Hypervitaminosis D: Secondary | ICD-10-CM

## 2022-12-11 NOTE — Patient Instructions (Addendum)
Please continue calcium 600 mg daily and vitamin D 2000 units 6/7 days.   Also, continue Prolia.  Please come back for a follow-up appointment in 1 year.

## 2022-12-11 NOTE — Progress Notes (Signed)
Patient ID: Kimberly Watson, female   DOB: March 27, 1941, 82 y.o.   MRN: [id number]   HPI  Kimberly Watson is a 82 y.o.-year-old female, initially referred by her PCP, Dr. Oneta Rack, returning for follow-up for osteoporosis (OP).  Last visit 1 year ago.  Interim history: She had no falls or fractures since last OV.  She continues to walk with a cane.  She has R knee pain. She will see Dr. Despina Hick - may need steroids injections. She had a L knee replacement in 2001. No dizziness/vertigo/orthostasis/poor vision.   She was found to have high triglycerides and is trying to reduce the fat in her diet.  Reviewed and addended hx: Pt was dx with OP in 1990s -in her late 40s.  I reviewed pt's DXA scan reports: Date L1-L4 T score FN T score 33% distal Radius Ultra distal radius  09/22/2021 ([facility]) N/a RFN: -3.2 LFN: -2.2 -2.5 (+12%!) N/a  07/03/2019 ([facility]) n/a RFN: -3.4 LFN: -2.2 -3.5 n/a  05/16/2017 ([facility]) n/a RFN: -2.9 LFN: -2.0 -3.1 n/a  04/09/2013 ([facility]) n/a RFN: -2.2 LFN: -1.8 n/a n/a   Date L1-L4 T score FN T score 33% distal Radius Ultra distal radius  04/27/2015 ([facility]) n/a RFN: -2.0 LFN: -2.2 -2.1 -4.4   She fell 10/2018 >> no fractures.  She needs another knee replacement (R). She had steroid 1x inj. in 2019 in R knee.  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 Reviewed available vit D levels: Lab Results  Component Value Date   VD25OH 50 12/05/2022   VD25OH 45 08/10/2022   VD25OH 66 03/09/2022   VD25OH 64.33 11/10/2021   VD25OH 77 03/09/2021   VD25OH 95.6 12/29/2020   VD25OH 109.0 (H) 10/28/2020   VD25OH 84.2 12/31/2019   VD25OH  90 09/17/2019   VD25OH 78 01/27/2019   Pt is on: - calcium carbonate 600 mg 2x a day >> 1x a day  - vitamin D 15,000 daily (5000 units 3x a day) >> 10,000 units daily >> 5000 units daily >> 5000 units 6/7 days >> 2000 units 6/7 days.  She was exercising at the gym before the Covid pandemic.  She was previously exercising along with her sister: Pushups, marching in place, touching toes, handweights.  She has not been exercising much lately due to knee pain.  She does not take high vitamin A doses.  Menopause was at 82 y/o.   FH of osteoporosis: father in his 78s, sisters with osteopenia.  No h/o hyper/hypocalcemia or hyperparathyroidism. No h/o kidney stones. Lab Results  Component Value Date   CALCIUM 9.7 12/05/2022   CALCIUM 9.9 08/10/2022   CALCIUM 9.7 03/09/2022   CALCIUM 10.3 11/07/2021   CALCIUM 9.8 08/04/2021   CALCIUM 9.7 03/09/2021   CALCIUM 9.9 12/09/2020   CALCIUM 9.3 11/25/2020   CALCIUM 9.1 11/20/2020   CALCIUM 9.9 11/19/2020   No h/o thyrotoxicosis. Reviewed TSH recent levels:  Lab Results  Component Value Date   TSH 1.37 03/09/2022   TSH 1.38 08/04/2021   TSH 1.23 03/09/2021   TSH 1.25 08/04/2020   TSH 1.51 03/03/2020   No h/o CKD. Last BUN/Cr: Lab Results  Component Value Date   BUN 21 12/05/2022   CREATININE 0.76 12/05/2022   She has myeloproliferative ds - dx'ed in 2017 >> improved with hydration and phlebotomies. She has esophageal strictures (had Es stretching in the past) and hiatal hernia.  ROS: + see HPI + hypoacusis + joint aches  I reviewed pt's medications, allergies, PMH, social hx, family hx, and changes were documented in the history of present illness. Otherwise, unchanged from my initial visit note.  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   Past Surgical History:  Procedure Laterality Date   CHOLECYSTECTOMY     cyst removed off spine     ESOPHAGOGASTRODUODENOSCOPY (EGD) WITH PROPOFOL N/A 11/20/2020   Procedure: ESOPHAGOGASTRODUODENOSCOPY (EGD) WITH PROPOFOL;  Surgeon: Napoleon Form, MD;  Location: [facility];  Service: Endoscopy;  Laterality: N/A;   IR IVC FILTER PLMT / S&I /IMG GUID/MOD SED  11/19/2020   IVC FILTER PLACEMENT ([facility] HX)  08/2015   JOINT REPLACEMENT Left total knee   TONSILLECTOMY     UTERINE FIBROID SURGERY     Social History   Socioeconomic History   Marital status: Divorced    Spouse name: Not on file   Number of children: 2   Years of education: Not on file   Highest education level: Not on file  Occupational History   Occupation: Runner, broadcasting/film/video    Comment: Retired  Tobacco Use   Smoking status: Never   Smokeless tobacco: Never  Vaping Use   Vaping Use: Never used  Substance and Sexual Activity   Alcohol use: No   Drug use: No   Sexual activity: Not on file    Comment: retired, next of kin, 1 son and daughter.  Other Topics Concern   Not on file  Social History Narrative   Divorced, 2 children lives alone.  Lived with a smoker for years.    Retired Chartered loss adjuster   No alcohol tobacco or drug use   10/08/2017      Social Determinants of Health   Financial Resource Strain: Not on file  Food Insecurity: Not on file  Transportation Needs: Not on file  Physical Activity: Not on file  Stress: Not on file  Social Connections: Not on file   Intimate Partner Violence: Not on file   Current Outpatient Medications on File Prior to Visit  Medication Sig Dispense Refill   apixaban (ELIQUIS) 2.5 MG TABS tablet TAKE 1 TABLET BY MOUTH TWICE A DAY TO PREVENT BLOOD CLOTS 180 tablet 1   calcium carbonate (OS-CAL) 600 MG TABS tablet Take 1,200 mg by mouth daily with breakfast.     Cholecalciferol (DIALYVITE VITAMIN D 5000) 125 MCG (5000 UT) capsule Take 5,000 Units by mouth daily. Take Monday  thru Saturday only.     esomeprazole (NEXIUM) 40 MG capsule Take  1 capsule  Daily  to Prevent Heartburn & Indigestion 90 capsule 3   hydroxyurea (HYDREA) 500 MG capsule TAKE 1 CAPSULE DAILY EXCEPT ON MONDAYS, WEDNESDAYS, FRIDAYS AND SATURDAYS, TAKE 2 CAPSULES 270 capsule 1   MAGNESIUM PO Take 500 mg by mouth daily.     metoprolol succinate (TOPROL-XL) 100 MG 24 hr tablet Take  1 tablet  Daily  for BP                                                       /                                                       TAKE                                     BY                             MOUTH 90 tablet 3   polyvinyl alcohol (LIQUIFILM TEARS) 1.4 % ophthalmic solution Place 1 drop into both eyes as needed for dry eyes.     sodium chloride (OCEAN) 0.65 % SOLN nasal spray Place 1 spray into both nostrils as needed for congestion.     vitamin C (ASCORBIC ACID) 500 MG tablet Take 500 mg by mouth daily. With meal     No current facility-administered medications on file prior to visit.   Allergies  Allergen Reactions   Evista [Raloxifene] Other (See Comments)    Blood clots - DVT and Pulmonary Embolism    Fosamax [Alendronate Sodium]     esophagitis   Family History  Problem Relation Age of Onset   Hypertension Mother    Diabetes Mother    Breast cancer Mother    Congestive Heart Failure Mother        Died age 55   Osteoporosis Father    Colon cancer Neg Hx    Colon polyps Neg Hx    Esophageal cancer Neg Hx    Rectal cancer Neg Hx    Stomach cancer Neg  Hx    PE: BP 120/82 (BP Location: Right Arm, Patient Position: Sitting, Cuff Size: Normal)   Pulse 71   Ht 5\' 3"  (1.6 m)   Wt 183 lb 9.6 oz (83.3 kg)   SpO2 91%   BMI 32.52 kg/m  Wt Readings from Last 3 Encounters:  12/11/22 183 lb 9.6 oz (83.3 kg)  12/05/22 182 lb 6.4 oz (82.7 kg)  10/11/22 185 lb 3.2 oz (84 kg)   Constitutional: overweight, in NAD, + Scoliosis and kyphosis. Eyes:  EOMI, no exophthalmos ENT: no neck masses, no cervical lymphadenopathy Cardiovascular: RRR, No MRG Respiratory: CTA B Musculoskeletal: no deformities Skin:no rashes Neurological: no tremor with outstretched hands  Assessment: 1. Osteoporosis  2.  High vitamin D  Plan: 1. Osteoporosis -Likely postmenopausal/age-related and she also has family history of osteoporosis -No falls or fractures since last visit -We  reviewed together her latest bone density scan which showed slight improvement at the right femoral neck and also significant increase in BMD at her 33% distal radius -After the above results returned, I advised her to continue with Prolia.  She is not missing injections.  She had 2 injections since last visit, tolerated well, without hip/thigh/jaw pain.  After her injection from 02/2022, she had swelling in her hand, but this resolved immediately and she did not get it again after the last Prolia injection. -She continues on calcium 600 mg daily.  Calcium level was normal earlier this month: Lab Results  Component Value Date   CALCIUM 9.7 12/05/2022   PHOS 3.7 08/22/2015  -She had a high vitamin D level in the past, but this improved after decreasing her vitamin D supplement (see below) -She relaxed her weightbearing exercise routine due to knee pain before last visit.  I advised her at last visit to try to do arm and chair exercises. -At today's visit, she still has knee pain and is thinking about going back to see Dr. Antony Odea and start knee injections.  I explained that these are usually  steroids and they may impact her bone density.  She is thinking about possibly seeing sports medicine first.  She would like to avoid a knee replacement if possible. -She is due for another DXA scan next year.  Stable or increasing T-scores are desirable -I will see her back in a year  2.  High vitamin D - most recent levels were normal: Lab Results  Component Value Date   VD25OH 50 12/05/2022   VD25OH 45 08/10/2022   VD25OH 66 03/09/2022   VD25OH 64.33 11/10/2021   VD25OH 77 03/09/2021  -Vitamin D was originally due to over replacement 15,000, then 10,000 units vitamin D3 daily. -She is currently on 5000 units 6 out of 7 days, which we continued at last visit >> however she could not find a higher dose when she went to the pharmacy so she started a lower dose, 2000 units 6/7 days >2 mo ago.  Since on this dose, her vitamin D level was normal, I advised her to continue  Carlus Pavlov, MD PhD [facility] Endocrinology

## 2022-12-27 ENCOUNTER — Telehealth: Payer: Self-pay

## 2022-12-27 NOTE — Telephone Encounter (Signed)
Faxed referral to [facility] off of [address]. Fax: [phone]

## 2023-01-23 ENCOUNTER — Inpatient Hospital Stay: Payer: Medicare PPO

## 2023-01-23 ENCOUNTER — Inpatient Hospital Stay: Payer: Medicare PPO | Attending: Hematology and Oncology | Admitting: Hematology and Oncology

## 2023-01-23 ENCOUNTER — Encounter: Payer: Self-pay | Admitting: Hematology and Oncology

## 2023-01-23 ENCOUNTER — Other Ambulatory Visit: Payer: Self-pay

## 2023-01-23 VITALS — BP 178/118 | HR 65 | Resp 18 | Ht 63.0 in | Wt 182.4 lb

## 2023-01-23 DIAGNOSIS — Z86718 Personal history of other venous thrombosis and embolism: Secondary | ICD-10-CM | POA: Diagnosis not present

## 2023-01-23 DIAGNOSIS — D471 Chronic myeloproliferative disease: Secondary | ICD-10-CM | POA: Insufficient documentation

## 2023-01-23 DIAGNOSIS — I82402 Acute embolism and thrombosis of unspecified deep veins of left lower extremity: Secondary | ICD-10-CM

## 2023-01-23 DIAGNOSIS — Z7901 Long term (current) use of anticoagulants: Secondary | ICD-10-CM | POA: Diagnosis not present

## 2023-01-23 DIAGNOSIS — Z79899 Other long term (current) drug therapy: Secondary | ICD-10-CM | POA: Diagnosis not present

## 2023-01-23 DIAGNOSIS — K250 Acute gastric ulcer with hemorrhage: Secondary | ICD-10-CM

## 2023-01-23 LAB — CBC WITH DIFFERENTIAL/PLATELET
Abs Immature Granulocytes: 0.02 10*3/uL (ref 0.00–0.07)
Basophils Absolute: 0.1 10*3/uL (ref 0.0–0.1)
Basophils Relative: 1 %
Eosinophils Absolute: 0.1 10*3/uL (ref 0.0–0.5)
Eosinophils Relative: 2 %
HCT: 43 % (ref 36.0–46.0)
Hemoglobin: 14.7 g/dL (ref 12.0–15.0)
Immature Granulocytes: 0 %
Lymphocytes Relative: 32 %
Lymphs Abs: 1.7 10*3/uL (ref 0.7–4.0)
MCH: 35.3 pg — ABNORMAL HIGH (ref 26.0–34.0)
MCHC: 34.2 g/dL (ref 30.0–36.0)
MCV: 103.1 fL — ABNORMAL HIGH (ref 80.0–100.0)
Monocytes Absolute: 0.5 10*3/uL (ref 0.1–1.0)
Monocytes Relative: 10 %
Neutro Abs: 3.1 10*3/uL (ref 1.7–7.7)
Neutrophils Relative %: 55 %
Platelets: 194 10*3/uL (ref 150–400)
RBC: 4.17 MIL/uL (ref 3.87–5.11)
RDW: 13.8 % (ref 11.5–15.5)
WBC: 5.5 10*3/uL (ref 4.0–10.5)
nRBC: 0 % (ref 0.0–0.2)

## 2023-01-23 NOTE — Progress Notes (Signed)
[facility] OFFICE PROGRESS NOTE  Patient Care Team: [name], MD as PCP - General (Internal Medicine) Mateo Flow, MD as Consulting Physician (Ophthalmology) Iva Boop, MD as Consulting Physician (Gastroenterology) Ihor Gully, MD (Inactive) as Consulting Physician (Urology) Meisinger, Tawanna Cooler, MD as Consulting Physician (Obstetrics and Gynecology)  ASSESSMENT & PLAN:  Myeloproliferative disorder [facility]) She is compliant taking medication as directed With adequate hydration, her CBC is back to normal She will continue current dose of hydroxyurea without changes I plan to see her again in 5-6 months for further follow-up  History of recurrent deep vein thrombosis (DVT) She denies bleeding complication from Eliquis She will continue treatment indefinitely due to high risk of recurrent thrombosis We discussed risk of recurrent thrombosis in the setting of dehydration and long distance travel I reminded the patient to drink more liquids  No orders of the defined types were placed in this encounter.   All questions were answered. The patient knows to call the clinic with any problems, questions or concerns. The total time spent in the appointment was 20 minutes encounter with patients including review of chart and various tests results, discussions about plan of care and coordination of care plan   Artis Delay, MD 01/23/2023 12:28 PM  INTERVAL HISTORY: Please see below for problem oriented charting. she returns for treatment follow-up on hydroxyurea for myeloproliferative disorder and Eliquis at preventive dose for history of DVT/PE She is doing well Her blood pressure is high today because she is anxious about driving to [place] to visit her daughter She is hydrating adequately No recent bleeding complications or recent infection  REVIEW OF SYSTEMS:   Constitutional: Denies fevers, chills or abnormal weight loss Eyes: Denies blurriness of  vision Ears, nose, mouth, throat, and face: Denies mucositis or sore throat Respiratory: Denies cough, dyspnea or wheezes Cardiovascular: Denies palpitation, chest discomfort or lower extremity swelling Gastrointestinal:  Denies nausea, heartburn or change in bowel habits Skin: Denies abnormal skin rashes Lymphatics: Denies new lymphadenopathy or easy bruising Neurological:Denies numbness, tingling or new weaknesses Behavioral/Psych: Mood is stable, no new changes  All other systems were reviewed with the patient and are negative.  I have reviewed the past medical history, past surgical history, social history and family history with the patient and they are unchanged from previous note.  ALLERGIES:  is allergic to evista [raloxifene] and fosamax [alendronate sodium].  MEDICATIONS:  Current Outpatient Medications  Medication Sig Dispense Refill   apixaban (ELIQUIS) 2.5 MG TABS tablet TAKE 1 TABLET BY MOUTH TWICE A DAY TO PREVENT BLOOD CLOTS 180 tablet 1   calcium carbonate (OS-CAL) 600 MG TABS tablet Take 1,200 mg by mouth daily with breakfast.     Cholecalciferol (DIALYVITE VITAMIN D 5000) 125 MCG (5000 UT) capsule Take 5,000 Units by mouth daily. Take Monday thru Saturday only.     esomeprazole (NEXIUM) 40 MG capsule Take  1 capsule  Daily  to Prevent Heartburn & Indigestion 90 capsule 3   hydroxyurea (HYDREA) 500 MG capsule TAKE 1 CAPSULE DAILY EXCEPT ON MONDAYS, WEDNESDAYS, FRIDAYS AND SATURDAYS, TAKE 2 CAPSULES 270 capsule 1   MAGNESIUM PO Take 500 mg by mouth daily.     metoprolol succinate (TOPROL-XL) 100 MG 24 hr tablet Take  1 tablet  Daily  for BP                                                       /  TAKE                                     BY                             MOUTH 90 tablet 3   polyvinyl alcohol (LIQUIFILM TEARS) 1.4 % ophthalmic solution Place 1 drop into both eyes as needed for dry eyes.     sodium chloride  (OCEAN) 0.65 % SOLN nasal spray Place 1 spray into both nostrils as needed for congestion.     vitamin C (ASCORBIC ACID) 500 MG tablet Take 500 mg by mouth daily. With meal     No current facility-administered medications for this visit.    SUMMARY OF ONCOLOGIC HISTORY:  SHAKILA MAK was referred here after recent diagnosis of severe left lower extremity DVT with bilateral PE and cor pulmonale. I reviewed her records extensively and collaborated the history with the patient. The patient has been taking Evista for over 25 years due to strong family history of breast cancer and for osteoporosis management. In January 2017, she had viral gastroenteritis with severe diarrhea and dehydration. The patient has been taking Lasix chronically for mild bilateral lower extremity edema, left greater than the right. 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 She recalled having profuse diarrhea nonstop for 24 hours leading up to the blood clot event. Of note, prior to the diagnosis, she was not taking aspirin consistently  Prior to that she denies recent history of trauma, long distance travel, recent surgery, smoking or prolonged immobilization. She had no prior history or diagnosis of cancer. Her age appropriate screening programs are up-to-date. She had prior surgeries before and never had perioperative thromboembolic events. The patient had been exposed to birth control pills and never had thrombotic events. The patient had been pregnant before and denies history of peripartum thromboembolic event or history of recurrent miscarriages. There is no family history of blood clots or miscarriages.  On 08/18/2015, she presented to the emergency department due to symptoms of severe weakness, chest pressure, shortness of breath and near syncopal episode. She had extensive evaluation in the emergency room. D-dimer was elevated at greater than 20.  CT  angiogram dated 08/19/2015 show massive saddle pulmonary embolus with evidence of severe right heart strain.  Left lower extremity venous Doppler on 08/19/2015 showed findings consistent with acute deep vein thrombosis involving the left femoral vein, left popliteal vein, left posterior tibial vein and left peroneal vein.  Echocardiogram on 08/19/2015 show preserved ejection fraction but with right ventricular dilatation, moderate tricuspid valvular regurgitation, oscillating density on TV(thought to be blood clot in transit) and moderate to severe increased right systolic pressure/evidence of severe pulmonary hypertension  The patient was admitted with IV heparin anticoagulation therapy and placement of IVC filter on 08/20/2015.  Echocardiogram was repeated on 08/23/2015 which showed persistent elevated pulmonary hypertension. The mass over the right tricuspid valve is no longer visible She was hospitalized on 08/18/2015 to 08/24/2015. She was discharged home on Eliquis.  Evista was discontinued. 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  Her anticoagulation was placed on hold recently and she developed acute DVT.  She had IVC filter placed on 11/19/2020 On 11/20/2020, she underwent repeat EGD due to GI bleed which showed - Z-line regular, 36 cm from the incisors. - No gross lesions in esophagus. - Non-bleeding gastric ulcer with a clean ulcer base (Forrest Class III). - Normal examined duodenum. - No specimens collected. On Dec 09, 2020, she resume Hydrea On February 28, 2021, the dose of hydroxyurea is  changed to 500 mg daily except on Sundays she will take 1000 mg by mouth On July 12, 2021, the dose of hydroxyurea is changed to 1000 mg 4 days a week and 500 mg the rest of the week   PHYSICAL EXAMINATION: ECOG PERFORMANCE STATUS: 0 - Asymptomatic  Vitals:   01/23/23 1156  BP: (!) 178/118  Pulse: 65  Resp: 18  SpO2: 96%   Filed Weights   01/23/23 1156  Weight: 182 lb 6.4 oz (82.7 kg)    GENERAL:alert, no distress and comfortable NEURO: alert & oriented x 3 with fluent speech, no focal motor/sensory deficits  LABORATORY DATA:  I have reviewed the data as listed    Component Value Date/Time   NA 139 12/05/2022 1227   K 4.6 12/05/2022 1227   CL 104 12/05/2022 1227   CO2 26 12/05/2022 1227   GLUCOSE 94 12/05/2022 1227   BUN 21 12/05/2022 1227   CREATININE 0.76 12/05/2022 1227   CALCIUM 9.7 12/05/2022 1227   PROT 7.1 12/05/2022 1227   ALBUMIN 4.0 12/09/2020 1219   AST 18 12/05/2022 1227   ALT 12 12/05/2022 1227   ALKPHOS 70 12/09/2020 1219   BILITOT 1.1 12/05/2022 1227   GFRNONAA 56 (L) 12/09/2020 1219   GFRNONAA 61 08/04/2020 1549   GFRAA 71 08/04/2020 1549    No results found for: "SPEP", "UPEP"  Lab Results  Component Value Date   WBC 4.8 12/05/2022   NEUTROABS 2,798 12/05/2022   HGB 14.9 12/05/2022   HCT 43.7 12/05/2022   MCV 100.2 (H) 12/05/2022   PLT 216 12/05/2022      Chemistry      Component Value Date/Time   NA 139 12/05/2022 1227   K 4.6 12/05/2022 1227   CL 104 12/05/2022 1227   CO2 26 12/05/2022 1227   BUN 21 12/05/2022 1227   CREATININE 0.76 12/05/2022 1227      Component Value Date/Time   CALCIUM 9.7 12/05/2022 1227   ALKPHOS 70 12/09/2020 1219   AST 18 12/05/2022 1227   ALT 12 12/05/2022 1227   BILITOT 1.1 12/05/2022 1227

## 2023-01-23 NOTE — Assessment & Plan Note (Signed)
She is compliant taking medication as directed With adequate hydration, her CBC is back to normal She will continue current dose of hydroxyurea without changes I plan to see her again in 5-6 months for further follow-up

## 2023-01-23 NOTE — Assessment & Plan Note (Signed)
She denies bleeding complication from Eliquis She will continue treatment indefinitely due to high risk of recurrent thrombosis We discussed risk of recurrent thrombosis in the setting of dehydration and long distance travel I reminded the patient to drink more liquids

## 2023-02-20 NOTE — Progress Notes (Deleted)
Assessment and Plan:  There are no diagnoses linked to this encounter.    Further disposition pending results of labs. Discussed med's effects and SE's.   Over 30 minutes of exam, counseling, chart review, and critical decision making was performed.   Future Appointments  Date Time Provider Department Center  02/21/2023  9:00 AM Kimberly Dick, NP [facility] None  03/13/2023 11:00 AM [name], MD [facility] None  07/05/2023 11:30 AM [facility] None  07/05/2023 12:00 PM Artis Delay, MD [facility] None  08/13/2023 11:00 AM [place], Archie Patten, NP [facility] None    ------------------------------------------------------------------------------------------------------------------   HPI There were no vitals taken for this visit. 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   Cholecalciferol (DIALYVITE VITAMIN D 5000) 125 MCG (5000 UT) capsule Take 5,000 Units by mouth daily. Take Monday thru Saturday only.   esomeprazole (NEXIUM) 40 MG capsule Take  1 capsule  Daily  to Prevent Heartburn & Indigestion   hydroxyurea (HYDREA) 500 MG capsule TAKE 1 CAPSULE DAILY EXCEPT ON MONDAYS, WEDNESDAYS, FRIDAYS AND SATURDAYS, TAKE 2 CAPSULES   MAGNESIUM PO Take 500 mg by mouth daily.   metoprolol succinate (TOPROL-XL) 100 MG 24 hr tablet Take  1 tablet  Daily  for BP                                                       /                                                       TAKE                                     BY  MOUTH   polyvinyl alcohol (LIQUIFILM TEARS) 1.4 % ophthalmic solution Place 1 drop into both eyes as needed for dry eyes.   sodium chloride (OCEAN) 0.65 % SOLN nasal spray Place 1 spray into both nostrils as needed for congestion.   vitamin C (ASCORBIC ACID) 500 MG tablet Take 500 mg by mouth daily. With meal   No current facility-administered medications on file prior to visit.    ROS: all negative except above.   Physical Exam:  There were no vitals taken for this visit.  General Appearance: Well nourished, in no apparent distress. Eyes: PERRLA, EOMs, conjunctiva no swelling or erythema Sinuses: No Frontal/maxillary tenderness ENT/Mouth: Ext aud canals clear, TMs without erythema, bulging. No erythema, swelling, or exudate on post pharynx.  Tonsils not swollen or erythematous. Hearing normal.  Neck: Supple, thyroid normal.  Respiratory: Respiratory effort normal, BS equal bilaterally without rales, rhonchi, wheezing or stridor.  Cardio: RRR with no MRGs. Brisk peripheral pulses without edema.  Abdomen: Soft, + BS.  Non tender, no guarding, rebound, hernias, masses. Lymphatics: Non tender without lymphadenopathy.  Musculoskeletal: Full ROM, 5/5 strength, normal gait.  Skin: Warm, dry without rashes, lesions,  ecchymosis.  Neuro: Cranial nerves intact. Normal muscle tone, no cerebellar symptoms. Sensation intact.  Psych: Awake and oriented X 3, normal affect, Insight and Judgment appropriate.     Kimberly Dick, NP 11:56 AM [facility]

## 2023-02-21 ENCOUNTER — Ambulatory Visit: Payer: Medicare PPO | Admitting: Internal Medicine

## 2023-02-21 ENCOUNTER — Ambulatory Visit: Payer: Medicare PPO | Admitting: Nurse Practitioner

## 2023-02-21 ENCOUNTER — Encounter: Payer: Self-pay | Admitting: Internal Medicine

## 2023-02-21 VITALS — BP 140/100 | HR 76 | Temp 97.9°F | Resp 17 | Ht 63.0 in | Wt 179.6 lb

## 2023-02-21 DIAGNOSIS — L03119 Cellulitis of unspecified part of limb: Secondary | ICD-10-CM | POA: Diagnosis not present

## 2023-02-21 MED ORDER — CEPHALEXIN 500 MG PO CAPS: ORAL_CAPSULE | ORAL | 1 refills | Status: DC

## 2023-02-21 NOTE — Patient Instructions (Signed)

## 2023-02-21 NOTE — Progress Notes (Signed)
     Future Appointments  Date Time Provider Department Center  03/13/2023 11:00 AM [name], MD [facility] None  07/05/2023 11:30 AM [facility] None  07/05/2023 12:00 PM Artis Delay, MD [facility] None  08/13/2023 11:00 AM Adela Glimpse, NP [facility] None    History of Present Illness:     This very nice 82 y.o. DWF with mpresents with concern re HTN, HLD, Prediabetes  and Vitamin D Deficiency presents with concern re: Skin infection of her Left lower leg   Current Outpatient Medications on File Prior to Visit  Medication Sig   ELIQUIS 2.5 MG  TAKE 1 TABLET  TWICE A DAY   OS-CAL 600 MG T Take 1,200 mg daily with breakfast.   VITAMIN D  5000 u  Take Monday thru Saturday only.   NEXIUM) 40 MG c Take  1 capsule  Daily    hydroxyurea 500 MG caps TAKE 1 cap  TThSun & 2 caps on MWF Sat,    MAGNESIUM   500   mg Take daily.   metoprolol succ -XL 100 MG  Take  1 tablet  Daily     LIQUIFILM TEARS  Place 1 drop into both eyes as needed for dry eyes.   sodium chloride 0.65 % SOLN nasal spray Place 1 spray into both nostrils as needed    vitamin C 500 MG  Take  daily     Allergies  Allergen Reactions   Evista [Raloxifene] Other (See Comments)    Blood clots - DVT and Pulmonary Embolism    Fosamax [Alendronate Sodium]     esophagitis     Problem list She has Essential hypertension; Hyperlipidemia, mixed; Vitamin D deficiency; GERD (gastroesophageal reflux disease); Nephrolithiasis; Osteoporosis; Medication management; Obesity (BMI 30.0-34.9); History of recurrent deep vein thrombosis (DVT); Hiatal hernia; Diverticulosis; Osteoarthritis of right knee; Myeloproliferative disorder (HCC); 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   Observations/Objective:  BP (!) 140/100   Pulse 76   Temp 97.9 F (36.6 C)   Resp 17   Ht 5\' 3"  (1.6 m)   Wt 179 lb 9.6 oz (81.5  kg)   SpO2 96%   BMI 31.81 kg/m   Skin focused exam finds an area of warm tender erythema over the  medial aspect of the Left  calf with a 15 x 12 mm bulla with apparent clear fluid.    Betadine /sugar compound applied over the bulla & then covered with a sterile 8" Tegaderm    Assessment and Plan:   1. Cellulitis of lower extremity,  Left   - cephALEXin (KEFLEX) 500 MG capsule;  Take  1 capsule   3 x /day  after Meals  for  Skin Infection Dispense: 42 capsule; Refill: 1  - ROV 12-14 days if not resolved   Follow Up Instructions:        I discussed the assessment and treatment plan with the patient. The patient was provided an opportunity to ask questions and all were answered. The patient agreed with the plan and demonstrated an understanding of the instructions.       The patient was advised to call back or seek an in-person evaluation if the symptoms worsen or if the condition fails to improve as anticipated.    Marinus Maw, MD

## 2023-03-12 ENCOUNTER — Encounter: Payer: Self-pay | Admitting: Internal Medicine

## 2023-03-12 NOTE — Telephone Encounter (Signed)
Prolia VOB initiated via [email or web address]  Next Prolia inj DUE: 04/14/23

## 2023-03-12 NOTE — Patient Instructions (Signed)

## 2023-03-12 NOTE — Progress Notes (Unsigned)
Annual Screening/Preventative Visit & Comprehensive Evaluation &  Examination  Future Appointments  Date Time Provider Department  03/13/2023 11:00 AM [name], MD [facility]  07/05/2023 12:00 PM Artis Delay, MD [facility]  08/13/2023                        wellness 11:00 AM Adela Glimpse, NP [facility]  03/19/2024                        cpe  3:00 PM [name], MD [facility]        This very nice 82 y.o. DWF  with   HTN, HLD, Prediabetes  and Vitamin D Deficiency  presents for a Screening /Preventative Visit & comprehensive evaluation and management of multiple medical co-morbidities.  Patient is followed by Dr Elvera Lennox for Osteoporosis.                                             Patient has hx/o intolerance to Fosamax and Reclast and in 2017 had DVT & PE's consequent of Evista Tx and is followed now By Dr C. Gherghe treating her Osteoporosis with Prolia. 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 She's also followed by Dr Bertis Ruddy  & on [facility] for MDS  .           HTN predates circa  1980's. Patient's BP has been controlled at home and patient denies any cardiac symptoms as chest pain, palpitations, shortness of breath, dizziness or ankle swelling. Today's BP is  at goal -  136/80 .        Patient's hyperlipidemia is controlled with diet and medications. Patient denies myalgias or other medication SE's. Last lipids were at goal:  Lab Results  Component Value Date   CHOL 162 12/05/2022   HDL 54 12/05/2022   LDLCALC 80 12/05/2022   TRIG 179 (H) 12/05/2022   CHOLHDL 3.0 12/05/2022         Patient  is monitored expectantly for glucose intolerance and patient denies reactive hypoglycemic symptoms, visual blurring, diabetic polys or paresthesias. Last A1c was normal  & at goal:  Lab Results  Component Value Date   HGBA1C 5.5 12/05/2022         Finally, patient has history of Vitamin D Deficiency and last Vitamin D was at goal:  Lab Results   Component Value Date   VD25OH 50 12/05/2022       Current Outpatient Medications  Medication Instructions   apixaban (ELIQUIS) 2.5 MG TABS t TAKE 1 TABLET  TWICE A DAY    VITAMIN C   500 mg Daily, With meal   calcium carbonate (OS-CAL)1,200 mg Daily with breakfast       Dialyvite Vitamin D 5000 Daily, Take Monday thru Saturday only.   esomeprazole 40 MG capsule Take  1 capsule  Daily    hydroxyurea (HYDREA) 500 MG capsule TAKE 1 CAPSULE DAILY EXCEPT ON MONDAYS, WEDNESDAYS, FRIDAYS AND SATURDAYS, TAKE 2 CAPSULES   MAGNESIUM 500 mg Daily   metoprolol succinate -XL  100 MG  Take  1 tablet  Daily     LIQUIFILM TEARS ophth  soln 1 drop, Both Eyes, As needed   sodium chloride   nasal spray 1 spray, Each Nare, As needed     Allergies  Allergen  Reactions   Evista [Raloxifene] Other (See Comments)    Blood clots - DVT and Pulmonary Embolism    Fosamax [Alendronate Sodium]     esophagitis    Past Medical History:  Diagnosis Date   AKI (acute kidney injury) (HCC) 08/19/2015   Cataract    Clotting disorder (HCC)    DVT of lower extremity (deep venous thrombosis) (HCC)    left, resolved   Elevated hemoglobin A1c    GERD (gastroesophageal reflux disease)    H/O left knee surgery    Hypertension    JAK2 gene mutation    Leiomyoma    adnexal Leiomyoma removal in 1/11   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   Nephrolithiasis    Osteoporosis    PE (pulmonary embolism) 08/2015     Health Maintenance  Topic Date Due   Zoster Vaccines- Shingrix (1 of 2) Never done   INFLUENZA VACCINE  02/28/2021   COVID-19 Vaccine (5 - Booster for Pfizer series) 04/12/2021   MAMMOGRAM  07/20/2021   TETANUS/TDAP  12/26/2028   DEXA SCAN  Completed   PNA vac Low Risk Adult  Completed   HPV VACCINES  Aged Out     Immunization History  Administered Date(s) Administered   DT  03/30/2015   Fluad Quad(high Dose ) 07/01/2020   Influenza, High Dose  04/20/2016, 05/02/2017, 05/20/2018    PFIZER Covid-19 Tri-Sucrose Vacc 12/10/2020   PFIZER-SARS-COV-2 Vacc 09/13/2019, 10/06/2019, 06/15/2020   Pneumococcal -13 03/25/2014   Tdap 12/27/2018   Zoster, Live 12/27/2005    Last Colon - 11/01/2017 - Dr Leone Payor - Recc no f/u   Last MGM - 09/22/2021   Past Surgical History:  Procedure Laterality Date   CHOLECYSTECTOMY     cyst removed off spine     ESOPHAGOGASTRODUODENOSCOPY (EGD) WITH PROPOFOL N/A 11/20/2020   EGD  Napoleon Form, MD   IR IVC FILTER PLMT / S&I /IMG GUID/MOD SED  11/19/2020   IVC FILTER PLACEMENT ([facility] HX)  08/2015   JOINT REPLACEMENT Left total knee   TONSILLECTOMY     UTERINE FIBROID SURGERY      Family History  Problem Relation Age of Onset   Hypertension Mother    Diabetes Mother    Breast cancer Mother    Congestive Heart Failure Mother        Died age 43   Osteoporosis Father    Colon cancer Neg Hx    Colon polyps Neg Hx    Esophageal cancer Neg Hx    Rectal cancer Neg Hx    Stomach cancer Neg Hx     Social History   Tobacco Use   Smoking status: Never   Smokeless tobacco: Never  Vaping Use   Vaping Use: Never used  Substance Use Topics   Alcohol use: No   Drug use: No     ROS Constitutional: Denies fever, chills, weight loss/gain, headaches, insomnia,  night sweats, and change in appetite. Does c/o fatigue. Eyes: Denies redness, blurred vision, diplopia, discharge, itchy, watery eyes.  ENT: Denies discharge, congestion, post nasal drip, epistaxis, sore throat, earache, hearing loss, dental pain, Tinnitus, Vertigo, Sinus pain, snoring.  Cardio: Denies chest pain, palpitations, irregular heartbeat, syncope, dyspnea, diaphoresis, orthopnea, PND, claudication, edema Respiratory: denies cough, dyspnea, DOE, pleurisy, hoarseness, laryngitis, wheezing.  Gastrointestinal: Denies dysphagia, heartburn, reflux, water brash, pain, cramps, nausea, vomiting, bloating, diarrhea, constipation, hematemesis, melena, hematochezia, jaundice,  hemorrhoids Genitourinary: Denies dysuria, frequency, urgency, nocturia, hesitancy, discharge, hematuria, flank pain  Breast: Breast lumps, nipple discharge, bleeding.  Musculoskeletal: Denies arthralgia, myalgia, stiffness, Jt. Swelling, pain, limp, and strain/sprain. Denies falls. Skin: Denies puritis, rash, hives, warts, acne, eczema, changing in skin lesion Neuro: No weakness, tremor, incoordination, spasms, paresthesia, pain Psychiatric: Denies confusion, memory loss, sensory loss. Denies Depression. Endocrine: Denies change in weight, skin, hair change, nocturia, and paresthesia, diabetic polys, visual blurring, hyper / hypo glycemic episodes.  Heme/Lymph: No excessive bleeding, bruising, enlarged lymph nodes.  Physical Exam  BP 136/80   Pulse 80   Temp 97.9 F (36.6 C)   Resp 16   Ht 5\' 3"  (1.6 m)   Wt 180 lb 6.4 oz (81.8 kg)   SpO2 97%   BMI 31.96 kg/m   General Appearance: over nourished, well groomed and in no apparent distress.  Eyes: PERRLA, EOMs, conjunctiva no swelling or erythema, normal fundi and vessels. Sinuses: No frontal/maxillary tenderness ENT/Mouth: EACs patent / TMs  nl. Nares clear without erythema, swelling, mucoid exudates. Oral hygiene is good. No erythema, swelling, or exudate. Tongue normal, non-obstructing. Tonsils not swollen or erythematous. Hearing normal.  Neck: Supple, thyroid not palpable. No bruits, nodes or JVD. Respiratory: Respiratory effort normal.  BS equal and clear bilateral without rales, rhonci, wheezing or stridor. Cardio: Heart sounds are normal with regular rate and rhythm and no murmurs, rubs or gallops. Peripheral pulses are normal and equal bilaterally without edema. No aortic or femoral bruits. Chest: symmetric with normal excursions and percussion. Breasts: Symmetric, without lumps, nipple discharge, retractions, or fibrocystic changes.  Abdomen: Flat, soft with bowel sounds active. Nontender, no guarding, rebound, hernias,  masses, or organomegaly.  Lymphatics: Non tender without lymphadenopathy.  Musculoskeletal: Full ROM all peripheral extremities, joint stability, 5/5 strength, and normal gait. Skin: Warm and dry without rashes, lesions, cyanosis, clubbing or  ecchymosis.  Neuro: Cranial nerves intact, reflexes equal bilaterally. Normal muscle tone, no cerebellar symptoms. Sensation intact.  Pysch: Alert and oriented X 3, normal affect, Insight and Judgment appropriate.    Assessment and Plan  1. Annual Preventative Screening Examination   2. Essential hypertension  - EKG 12-Lead - Urinalysis, Routine w reflex microscopic - Microalbumin / creatinine urine ratio - CBC with Differential/Platelet - COMPLETE METABOLIC PANEL WITH GFR - Magnesium - TSH  3. Hyperlipidemia, mixed  - EKG 12-Lead - Lipid panel - TSH  4. Abnormal glucose  - EKG 12-Lead - Hemoglobin A1c - Insulin, random  5. Vitamin D deficiency  - VITAMIN D 25 Hydroxy   6. Myeloproliferative disorder (HCC)  - CBC with Differential/Platelet - COMPLETE METABOLIC PANEL WITH GFR  7. Gastroesophageal reflux disease  - CBC with Differential/Platelet  8. History of recurrent deep vein thrombosis (DVT)  - CBC with Differential/Platelet  9. 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 Screening for heart disease  - EKG 12-Lead  11. FHx: heart disease  - EKG 12-Lead  12. Screening for colorectal cancer  - POC Hemoccult Bld/Stl   13. Medication management  - Urinalysis, Routine w reflex microscopic - Microalbumin / creatinine urine ratio - CBC with Differential/Platelet - COMPLETE METABOLIC PANEL WITH GFR - Magnesium - Lipid panel - TSH - Hemoglobin A1c - Insulin, random - VITAMIN D 25 Hydroxyl          Patient was counseled in prudent diet to achieve/maintain BMI less than 25 for weight control, BP monitoring, regular exercise and medications. Discussed med's  effects and SE's. Screening labs and tests as requested with regular follow-up as  recommended. Over 40 minutes of exam, counseling, chart review and high complex critical decision making was performed.   Marinus Maw, MD

## 2023-03-13 ENCOUNTER — Ambulatory Visit (INDEPENDENT_AMBULATORY_CARE_PROVIDER_SITE_OTHER): Payer: Medicare PPO | Admitting: Internal Medicine

## 2023-03-13 ENCOUNTER — Encounter: Payer: Self-pay | Admitting: Internal Medicine

## 2023-03-13 VITALS — BP 136/80 | HR 80 | Temp 97.9°F | Resp 16 | Ht 63.0 in | Wt 180.4 lb

## 2023-03-13 DIAGNOSIS — Z8249 Family history of ischemic heart disease and other diseases of the circulatory system: Secondary | ICD-10-CM

## 2023-03-13 DIAGNOSIS — Z79899 Other long term (current) drug therapy: Secondary | ICD-10-CM | POA: Diagnosis not present

## 2023-03-13 DIAGNOSIS — E559 Vitamin D deficiency, unspecified: Secondary | ICD-10-CM | POA: Diagnosis not present

## 2023-03-13 DIAGNOSIS — E782 Mixed hyperlipidemia: Secondary | ICD-10-CM | POA: Diagnosis not present

## 2023-03-13 DIAGNOSIS — D471 Chronic myeloproliferative disease: Secondary | ICD-10-CM | POA: Diagnosis not present

## 2023-03-13 DIAGNOSIS — I10 Essential (primary) hypertension: Secondary | ICD-10-CM | POA: Diagnosis not present

## 2023-03-13 DIAGNOSIS — Z7901 Long term (current) use of anticoagulants: Secondary | ICD-10-CM

## 2023-03-13 DIAGNOSIS — R7309 Other abnormal glucose: Secondary | ICD-10-CM | POA: Diagnosis not present

## 2023-03-13 DIAGNOSIS — Z0000 Encounter for general adult medical examination without abnormal findings: Secondary | ICD-10-CM | POA: Diagnosis not present

## 2023-03-13 DIAGNOSIS — Z86718 Personal history of other venous thrombosis and embolism: Secondary | ICD-10-CM

## 2023-03-13 DIAGNOSIS — K219 Gastro-esophageal reflux disease without esophagitis: Secondary | ICD-10-CM | POA: Diagnosis not present

## 2023-03-13 DIAGNOSIS — Z136 Encounter for screening for cardiovascular disorders: Secondary | ICD-10-CM | POA: Diagnosis not present

## 2023-03-13 DIAGNOSIS — Z0001 Encounter for general adult medical examination with abnormal findings: Secondary | ICD-10-CM

## 2023-03-13 DIAGNOSIS — Z1211 Encounter for screening for malignant neoplasm of colon: Secondary | ICD-10-CM

## 2023-03-13 LAB — CBC WITH DIFFERENTIAL/PLATELET
Absolute Monocytes: 574 cells/uL (ref 200–950)
Basophils Absolute: 58 cells/uL (ref 0–200)
Basophils Relative: 1 %
Eosinophils Absolute: 122 cells/uL (ref 15–500)
Eosinophils Relative: 2.1 %
HCT: 43.8 % (ref 35.0–45.0)
Hemoglobin: 14.7 g/dL (ref 11.7–15.5)
Lymphs Abs: 1885 cells/uL (ref 850–3900)
MCH: 33.5 pg — ABNORMAL HIGH (ref 27.0–33.0)
MCHC: 33.6 g/dL (ref 32.0–36.0)
MCV: 99.8 fL (ref 80.0–100.0)
MPV: 10.8 fL (ref 7.5–12.5)
Monocytes Relative: 9.9 %
Neutro Abs: 3161 cells/uL (ref 1500–7800)
Neutrophils Relative %: 54.5 %
Platelets: 271 10*3/uL (ref 140–400)
RBC: 4.39 10*6/uL (ref 3.80–5.10)
RDW: 13.2 % (ref 11.0–15.0)
Total Lymphocyte: 32.5 %
WBC: 5.8 10*3/uL (ref 3.8–10.8)

## 2023-03-17 NOTE — Telephone Encounter (Signed)
Prior Auth required for [employer]  PA PROCESS DETAILS: PA is required. PA can be initiated by calling [phone] or online at [email or web address]

## 2023-03-17 NOTE — Telephone Encounter (Signed)
Pt ready for scheduling on or after 04/14/23  Out-of-pocket cost due at time of visit: $40  Primary: [employer] [place] [employer] PPO Prolia co-insurance: 0% Admin fee co-insurance: $40  Prior Auth: APPROVED PA# [id number] Valid:01/24/22-07/31/23  Secondary: N/A Prolia co-insurance:  Admin fee co-insurance:  Deductible:  Prior Auth:  PA# Valid:     ** This summary of benefits is an estimation of the patient's out-of-pocket cost. Exact cost may very based on individual plan coverage.

## 2023-03-17 NOTE — Telephone Encounter (Signed)
Prior Auth: APPROVED PA# [id number] Valid:01/24/22-07/31/23

## 2023-03-24 ENCOUNTER — Other Ambulatory Visit: Payer: Self-pay | Admitting: Hematology and Oncology

## 2023-04-03 ENCOUNTER — Other Ambulatory Visit: Payer: Self-pay

## 2023-04-03 DIAGNOSIS — Z1212 Encounter for screening for malignant neoplasm of rectum: Secondary | ICD-10-CM | POA: Diagnosis not present

## 2023-04-03 DIAGNOSIS — Z1211 Encounter for screening for malignant neoplasm of colon: Secondary | ICD-10-CM

## 2023-04-03 LAB — POC HEMOCCULT BLD/STL (HOME/3-CARD/SCREEN)
Card #2 Fecal Occult Blod, POC: NEGATIVE
Card #3 Fecal Occult Blood, POC: NEGATIVE
Fecal Occult Blood, POC: NEGATIVE

## 2023-04-26 DIAGNOSIS — M25561 Pain in right knee: Secondary | ICD-10-CM | POA: Diagnosis not present

## 2023-05-19 NOTE — Telephone Encounter (Signed)
FYI  Pt > 30 days past due for PROLIA injection.   If you would like for pt to continue with Prolia therapy, please have clinical staff reach out to pt for scheduling and to explain to importance of receiving Prolia injections every 6 months as abrupt cessation of Prolia raises risk of osteoporotic fracture.    "Discontinuation of Dmab is associated with a 3- to 5-fold higher risk for vertebral, major osteoporotic, and hip fractures [38,39]."   [email or web address]

## 2023-05-21 DIAGNOSIS — M25561 Pain in right knee: Secondary | ICD-10-CM | POA: Diagnosis not present

## 2023-05-21 NOTE — Telephone Encounter (Signed)
LMTRC  JMiller,RMA 

## 2023-05-23 ENCOUNTER — Ambulatory Visit (INDEPENDENT_AMBULATORY_CARE_PROVIDER_SITE_OTHER): Payer: Medicare PPO

## 2023-05-23 DIAGNOSIS — M810 Age-related osteoporosis without current pathological fracture: Secondary | ICD-10-CM

## 2023-05-23 MED ORDER — DENOSUMAB 60 MG/ML ~~LOC~~ SOSY
60.0000 mg | PREFILLED_SYRINGE | Freq: Once | SUBCUTANEOUS | Status: AC
Start: 2023-05-23 — End: 2023-05-23
  Administered 2023-05-23: 60 mg via SUBCUTANEOUS

## 2023-05-23 NOTE — Telephone Encounter (Signed)
Patient scheduled and received Prolia on 05/23/23

## 2023-05-23 NOTE — Progress Notes (Signed)
After obtaining consent, and per orders of Dr. Elvera Lennox, injection of Prolia given by Pollie Meyer. Patient instructed to remain in clinic for 20 minutes afterwards, and to report any adverse reaction to me immediately.

## 2023-05-28 DIAGNOSIS — M25561 Pain in right knee: Secondary | ICD-10-CM | POA: Diagnosis not present

## 2023-05-29 ENCOUNTER — Telehealth: Payer: Self-pay | Admitting: Hematology and Oncology

## 2023-05-29 NOTE — Telephone Encounter (Signed)
Last Prolia inj 05/23/23 Next Prolia inj due 11/22/23

## 2023-05-29 NOTE — Telephone Encounter (Signed)
Spoke with patient confirming upcoming appointment  

## 2023-05-30 DIAGNOSIS — M25561 Pain in right knee: Secondary | ICD-10-CM | POA: Diagnosis not present

## 2023-06-04 DIAGNOSIS — M25561 Pain in right knee: Secondary | ICD-10-CM | POA: Diagnosis not present

## 2023-06-06 DIAGNOSIS — M25561 Pain in right knee: Secondary | ICD-10-CM | POA: Diagnosis not present

## 2023-06-11 DIAGNOSIS — M25561 Pain in right knee: Secondary | ICD-10-CM | POA: Diagnosis not present

## 2023-06-13 DIAGNOSIS — M25561 Pain in right knee: Secondary | ICD-10-CM | POA: Diagnosis not present

## 2023-06-14 ENCOUNTER — Other Ambulatory Visit: Payer: Self-pay | Admitting: Hematology and Oncology

## 2023-06-14 ENCOUNTER — Other Ambulatory Visit: Payer: Self-pay | Admitting: Internal Medicine

## 2023-06-14 DIAGNOSIS — I10 Essential (primary) hypertension: Secondary | ICD-10-CM

## 2023-07-05 ENCOUNTER — Other Ambulatory Visit: Payer: Medicare PPO

## 2023-07-05 ENCOUNTER — Ambulatory Visit: Payer: Medicare PPO | Admitting: Hematology and Oncology

## 2023-07-10 ENCOUNTER — Inpatient Hospital Stay: Payer: Medicare PPO | Admitting: Hematology and Oncology

## 2023-07-10 ENCOUNTER — Inpatient Hospital Stay: Payer: Medicare PPO | Attending: Hematology and Oncology

## 2023-07-10 ENCOUNTER — Encounter: Payer: Self-pay | Admitting: Hematology and Oncology

## 2023-07-10 VITALS — BP 174/88 | HR 63 | Temp 98.3°F | Resp 19 | Wt 186.6 lb

## 2023-07-10 DIAGNOSIS — Z86718 Personal history of other venous thrombosis and embolism: Secondary | ICD-10-CM

## 2023-07-10 DIAGNOSIS — Z79899 Other long term (current) drug therapy: Secondary | ICD-10-CM | POA: Insufficient documentation

## 2023-07-10 DIAGNOSIS — Z7964 Long term (current) use of myelosuppressive agent: Secondary | ICD-10-CM | POA: Diagnosis not present

## 2023-07-10 DIAGNOSIS — K250 Acute gastric ulcer with hemorrhage: Secondary | ICD-10-CM

## 2023-07-10 DIAGNOSIS — D471 Chronic myeloproliferative disease: Secondary | ICD-10-CM | POA: Diagnosis present

## 2023-07-10 DIAGNOSIS — Z7901 Long term (current) use of anticoagulants: Secondary | ICD-10-CM | POA: Insufficient documentation

## 2023-07-10 DIAGNOSIS — I82402 Acute embolism and thrombosis of unspecified deep veins of left lower extremity: Secondary | ICD-10-CM

## 2023-07-10 LAB — CBC WITH DIFFERENTIAL/PLATELET
Abs Immature Granulocytes: 0.01 10*3/uL (ref 0.00–0.07)
Basophils Absolute: 0.1 10*3/uL (ref 0.0–0.1)
Basophils Relative: 1 %
Eosinophils Absolute: 0.1 10*3/uL (ref 0.0–0.5)
Eosinophils Relative: 2 %
HCT: 44.6 % (ref 36.0–46.0)
Hemoglobin: 14.4 g/dL (ref 12.0–15.0)
Immature Granulocytes: 0 %
Lymphocytes Relative: 29 %
Lymphs Abs: 1.5 10*3/uL (ref 0.7–4.0)
MCH: 33.4 pg (ref 26.0–34.0)
MCHC: 32.3 g/dL (ref 30.0–36.0)
MCV: 103.5 fL — ABNORMAL HIGH (ref 80.0–100.0)
Monocytes Absolute: 0.6 10*3/uL (ref 0.1–1.0)
Monocytes Relative: 11 %
Neutro Abs: 3 10*3/uL (ref 1.7–7.7)
Neutrophils Relative %: 57 %
Platelets: 245 10*3/uL (ref 150–400)
RBC: 4.31 MIL/uL (ref 3.87–5.11)
RDW: 13.5 % (ref 11.5–15.5)
WBC: 5.2 10*3/uL (ref 4.0–10.5)
nRBC: 0 % (ref 0.0–0.2)

## 2023-07-10 NOTE — Assessment & Plan Note (Signed)
She is compliant taking medication as directed CBC is normal She will continue current dose of hydroxyurea without changes I plan to see her again in 5-6 months for further follow-up

## 2023-07-10 NOTE — Assessment & Plan Note (Signed)
She denies bleeding complication from Eliquis She will continue treatment indefinitely due to high risk of recurrent thrombosis

## 2023-07-10 NOTE — Progress Notes (Signed)
Kimberly Watson OFFICE PROGRESS NOTE  [name], MD  ASSESSMENT & PLAN:  Myeloproliferative disorder [name]) She is compliant taking medication as directed CBC is normal She will continue current dose of hydroxyurea without changes I plan to see her again in 5-6 months for further follow-up  History of recurrent deep vein thrombosis (DVT) She denies bleeding complication from Eliquis She will continue treatment indefinitely due to high risk of recurrent thrombosis  No orders of the defined types were placed in this encounter.   The total time spent in the appointment was 20 minutes encounter with patients including review of chart and various tests results, discussions about plan of care and coordination of care plan   All questions were answered. The patient knows to call the clinic with any problems, questions or concerns. No barriers to learning was detected.    Artis Delay, MD 12/10/202412:15 PM  INTERVAL HISTORY: Kimberly Watson 82 y.o. female returns for further follow-up for myeloproliferative disorder on Hydrea and Eliquis maintenance for history of recurrent DVT She is doing well No recent bleeding We discussed CBC results and future follow-up  SUMMARY OF HEMATOLOGIC HISTORY:  [name] was referred here after recent diagnosis of severe left lower extremity DVT with bilateral PE and cor pulmonale. I reviewed her records extensively and collaborated the history with the patient. The patient has been taking Evista for over 25 years due to strong family history of breast cancer and for osteoporosis management. In January 2017, she had viral gastroenteritis with severe diarrhea and dehydration. The patient has been taking Lasix chronically for mild bilateral lower extremity edema, left greater than the right. 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 She recalled having profuse diarrhea nonstop  for 24 hours leading up to the blood clot event. Of note, prior to the diagnosis, she was not taking aspirin consistently  Prior to that she denies recent history of trauma, long distance travel, recent surgery, smoking or prolonged immobilization. She had no prior history or diagnosis of cancer. Her age appropriate screening programs are up-to-date. She had prior surgeries before and never had perioperative thromboembolic events. The patient had been exposed to birth control pills and never had thrombotic events. The patient had been pregnant before and denies history of peripartum thromboembolic event or history of recurrent miscarriages. There is no family history of blood clots or miscarriages.  On 08/18/2015, she presented to the emergency department due to symptoms of severe weakness, chest pressure, shortness of breath and near syncopal episode. She had extensive evaluation in the emergency room. D-dimer was elevated at greater than 20.  CT angiogram dated 08/19/2015 show massive saddle pulmonary embolus with evidence of severe right heart strain.  Left lower extremity venous Doppler on 08/19/2015 showed findings consistent with acute deep vein thrombosis involving the left femoral vein, left popliteal vein, left posterior tibial vein and left peroneal vein.  Echocardiogram on 08/19/2015 show preserved ejection fraction but with right ventricular dilatation, moderate tricuspid valvular regurgitation, oscillating density on TV(thought to be blood clot in transit) and moderate to severe increased right systolic pressure/evidence of severe pulmonary hypertension  The patient was admitted with IV heparin anticoagulation therapy and placement of IVC filter on 08/20/2015.  Echocardiogram was repeated on 08/23/2015 which showed persistent elevated pulmonary hypertension. The mass over the right tricuspid valve is no longer visible She was hospitalized on 08/18/2015 to 08/24/2015. She was  discharged home on Eliquis.  Evista was discontinued. 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  Her anticoagulation was placed on hold recently and she developed acute DVT.  She had IVC filter placed on 11/19/2020 On 11/20/2020, she underwent repeat EGD due to GI bleed which showed - Z-line regular, 36 cm from the incisors. - No gross lesions in esophagus. - Non-bleeding gastric ulcer with a clean ulcer base (Forrest Class III). - Normal examined duodenum. - No specimens collected. On Dec 09, 2020, she resume Hydrea On February 28, 2021, the dose of hydroxyurea is changed to 500 mg daily except on Sundays she will take 1000 mg by mouth On July 12, 2021, the dose of hydroxyurea is changed to 1000 mg 4 days a week and 500 mg the rest of the week  I have reviewed the past medical history, past surgical history, social history and family history with the patient and they are unchanged from previous note.  ALLERGIES:  is allergic to evista [raloxifene] and fosamax [alendronate sodium].  MEDICATIONS:  Current Outpatient Medications  Medication Sig Dispense Refill   calcium carbonate (OS-CAL) 600 MG TABS tablet Take 1,200 mg by mouth daily with breakfast.     Cholecalciferol (DIALYVITE VITAMIN D 5000) 125 MCG (5000 UT) capsule Take 5,000 Units by mouth daily. Take Monday thru Saturday only.     ELIQUIS 2.5 MG TABS tablet TAKE 1 TABLET BY MOUTH TWICE A DAY TO PREVENT BLOOD CLOTS 180 tablet 1   esomeprazole (NEXIUM) 40 MG capsule Take  1 capsule  Daily  to Prevent Heartburn & Indigestion 90 capsule 3    hydroxyurea (HYDREA) 500 MG capsule TAKE 1 CAPSULE DAILY EXCEPT ON MONDAYS, WEDNESDAYS, FRIDAYS AND SATURDAYS, TAKE 2 CAPSULES 132 capsule 4   MAGNESIUM PO Take 500 mg by mouth daily.     metoprolol succinate (TOPROL-XL) 100 MG 24 hr tablet TAKE 1 TABLET BY MOUTH EVERY DAY FOR BLOOD PRESSURE 90 tablet 3   polyvinyl alcohol (LIQUIFILM TEARS) 1.4 % ophthalmic solution Place 1 drop into both eyes as needed for dry eyes.     sodium chloride (OCEAN) 0.65 % SOLN nasal spray Place 1 spray into both nostrils as needed for congestion.     vitamin C (ASCORBIC ACID) 500 MG tablet Take 500 mg by mouth daily. With meal     No current facility-administered medications for this visit.     REVIEW OF SYSTEMS:   Constitutional: Denies fevers, chills or night sweats Eyes: Denies blurriness of vision Ears, nose, mouth, throat, and face: Denies mucositis or sore throat Respiratory: Denies cough, dyspnea or wheezes Cardiovascular: Denies palpitation, chest discomfort or lower extremity swelling Gastrointestinal:  Denies nausea, heartburn or change in bowel habits Skin: Denies abnormal skin rashes Lymphatics: Denies new lymphadenopathy or easy bruising Neurological:Denies numbness, tingling or new weaknesses Behavioral/Psych: Mood is stable, no new changes  All other systems were reviewed with the patient and are negative.  PHYSICAL EXAMINATION: ECOG PERFORMANCE STATUS: 0 - Asymptomatic  Vitals:   07/10/23 1146 07/10/23 1147  BP: (!) 168/98 (!) 174/88  Pulse: 63   Resp: 19   Temp: 98.3 F (36.8 C)   SpO2: 93%    Filed Weights  07/10/23 1146  Weight: 186 lb 9.6 oz (84.6 kg)    GENERAL:alert, no distress and comfortable   LABORATORY DATA:  I have reviewed the data as listed     Component Value Date/Time   NA 137 03/13/2023 1055   K 4.4 03/13/2023 1055   CL 102 03/13/2023 1055   CO2 26 03/13/2023 1055   GLUCOSE 98 03/13/2023 1055   BUN 20 03/13/2023 1055   CREATININE 0.95 03/13/2023 1055    CALCIUM 10.2 03/13/2023 1055   PROT 7.4 03/13/2023 1055   ALBUMIN 4.0 12/09/2020 1219   AST 18 03/13/2023 1055   ALT 16 03/13/2023 1055   ALKPHOS 70 12/09/2020 1219   BILITOT 1.1 03/13/2023 1055   GFRNONAA 56 (L) 12/09/2020 1219   GFRNONAA 61 08/04/2020 1549   GFRAA 71 08/04/2020 1549    No results found for: "SPEP", "UPEP"  Lab Results  Component Value Date   WBC 5.2 07/10/2023   NEUTROABS 3.0 07/10/2023   HGB 14.4 07/10/2023   HCT 44.6 07/10/2023   MCV 103.5 (H) 07/10/2023   PLT 245 07/10/2023      Chemistry      Component Value Date/Time   NA 137 03/13/2023 1055   K 4.4 03/13/2023 1055   CL 102 03/13/2023 1055   CO2 26 03/13/2023 1055   BUN 20 03/13/2023 1055   CREATININE 0.95 03/13/2023 1055      Component Value Date/Time   CALCIUM 10.2 03/13/2023 1055   ALKPHOS 70 12/09/2020 1219   AST 18 03/13/2023 1055   ALT 16 03/13/2023 1055   BILITOT 1.1 03/13/2023 1055

## 2023-07-12 DIAGNOSIS — H1045 Other chronic allergic conjunctivitis: Secondary | ICD-10-CM | POA: Diagnosis not present

## 2023-07-12 DIAGNOSIS — H26493 Other secondary cataract, bilateral: Secondary | ICD-10-CM | POA: Diagnosis not present

## 2023-07-12 DIAGNOSIS — H524 Presbyopia: Secondary | ICD-10-CM | POA: Diagnosis not present

## 2023-07-12 DIAGNOSIS — H47321 Drusen of optic disc, right eye: Secondary | ICD-10-CM | POA: Diagnosis not present

## 2023-07-12 DIAGNOSIS — M1711 Unilateral primary osteoarthritis, right knee: Secondary | ICD-10-CM | POA: Diagnosis not present

## 2023-07-12 DIAGNOSIS — H04123 Dry eye syndrome of bilateral lacrimal glands: Secondary | ICD-10-CM | POA: Diagnosis not present

## 2023-07-19 DIAGNOSIS — M1711 Unilateral primary osteoarthritis, right knee: Secondary | ICD-10-CM | POA: Diagnosis not present

## 2023-07-30 DIAGNOSIS — M1711 Unilateral primary osteoarthritis, right knee: Secondary | ICD-10-CM | POA: Diagnosis not present

## 2023-08-13 ENCOUNTER — Encounter: Payer: Self-pay | Admitting: Nurse Practitioner

## 2023-08-13 ENCOUNTER — Ambulatory Visit (INDEPENDENT_AMBULATORY_CARE_PROVIDER_SITE_OTHER): Payer: Medicare PPO | Admitting: Nurse Practitioner

## 2023-08-13 VITALS — BP 140/82 | HR 63 | Temp 97.4°F | Ht 63.0 in | Wt 182.0 lb

## 2023-08-13 DIAGNOSIS — Z7901 Long term (current) use of anticoagulants: Secondary | ICD-10-CM

## 2023-08-13 DIAGNOSIS — E782 Mixed hyperlipidemia: Secondary | ICD-10-CM

## 2023-08-13 DIAGNOSIS — E559 Vitamin D deficiency, unspecified: Secondary | ICD-10-CM | POA: Diagnosis not present

## 2023-08-13 DIAGNOSIS — K219 Gastro-esophageal reflux disease without esophagitis: Secondary | ICD-10-CM | POA: Diagnosis not present

## 2023-08-13 DIAGNOSIS — I10 Essential (primary) hypertension: Secondary | ICD-10-CM

## 2023-08-13 DIAGNOSIS — Z0000 Encounter for general adult medical examination without abnormal findings: Secondary | ICD-10-CM

## 2023-08-13 DIAGNOSIS — N200 Calculus of kidney: Secondary | ICD-10-CM

## 2023-08-13 DIAGNOSIS — K449 Diaphragmatic hernia without obstruction or gangrene: Secondary | ICD-10-CM

## 2023-08-13 DIAGNOSIS — E66811 Obesity, class 1: Secondary | ICD-10-CM

## 2023-08-13 DIAGNOSIS — Z79899 Other long term (current) drug therapy: Secondary | ICD-10-CM | POA: Diagnosis not present

## 2023-08-13 DIAGNOSIS — R7309 Other abnormal glucose: Secondary | ICD-10-CM

## 2023-08-13 DIAGNOSIS — Z86718 Personal history of other venous thrombosis and embolism: Secondary | ICD-10-CM

## 2023-08-13 DIAGNOSIS — M1711 Unilateral primary osteoarthritis, right knee: Secondary | ICD-10-CM

## 2023-08-13 DIAGNOSIS — M810 Age-related osteoporosis without current pathological fracture: Secondary | ICD-10-CM | POA: Diagnosis not present

## 2023-08-13 DIAGNOSIS — D471 Chronic myeloproliferative disease: Secondary | ICD-10-CM

## 2023-08-13 NOTE — Progress Notes (Signed)
 Medicare wellness  Assessment:   Annual Medicare Wellness Visit Due annually  Health maintenance reviewed  Essential hypertension Discussed DASH (Dietary Approaches to Stop Hypertension) DASH diet is lower in sodium than a typical American diet. Cut back on foods that are high in saturated fat, cholesterol, and trans fats. Eat more whole-grain foods, fish, poultry, and nuts Remain active and exercise as tolerated daily.  Monitor BP at home-Call if greater than 130/80.  Check CMP/CBC  Mixed hyperlipidemia Discussed lifestyle modifications. Recommended diet heavy in fruits and veggies, omega 3's. Decrease consumption of animal meats, cheeses, and dairy products. Remain active and exercise as tolerated. Continue to monitor. Check lipids/TSH  Other abnormal glucose Recent A1Cs at goal Discussed diet/exercise, weight management  Defer A1C; check CMP  Osteoporosis Continue CA and vitamin D  Dr. Trixie following for prolia   Pursue a combination of weight-bearing exercises and strength training. Advised on fall prevention measures including proper lighting in all rooms, removal of area rugs and floor clutter, use of walking devices as deemed appropriate, avoidance of uneven walking surfaces. Smoking cessation and moderate alcohol consumption if applicable Consume 800 to 1000 IU of vitamin D  daily with a goal vitamin D  serum value of 30 ng/mL or higher. Aim for 1000 to 1200 mg of elemental calcium  daily through supplements and/or dietary sources.   Vitamin D  deficiency At goal at recent check; continue to recommend supplementation for goal of 60-100 Monitor levels  Medication management All medications discussed and reviewed in full. All questions and concerns regarding medications addressed.    Nephrolithiasis Stay well hydrated Continue to monitor   GERD/hiatal hernia No suspected reflux complications (Barret/stricture). Lifestyle modification:  wt loss, avoid meals  2-3h before bedtime. Consider eliminating food triggers:  chocolate, caffeine, EtOH, acid/spicy food.   Obesity - BMI 30 Discussed appropriate BMI Diet modification. Physical activity. Encouraged/praised to build confidence.   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 Lonn is following Continue to monitor   Myeloproliferative disorder [facility]) Dr. Lonn following, good response on hydroxyurea   R knee arthritis Managing with tylenol /rest  Dr. Melodi follows but high risk for surgery- deferring Continue regular exercise for strength and balance  Orders Placed This Encounter  Procedures   CBC with Differential/Platelet   COMPLETE METABOLIC PANEL WITH GFR   Lipid panel   Hemoglobin A1c    Notify office for further evaluation and treatment, questions or concerns if any reported s/s fail to improve.   The patient was advised to call back or seek an in-person evaluation if any symptoms worsen or if the condition fails to improve as anticipated.   Further disposition pending results of labs. Discussed med's effects and SE's.    I discussed the assessment and treatment plan with the patient. The patient was provided an opportunity to ask questions and all were answered. The patient agreed with the plan and demonstrated an understanding of the instructions.  Discussed med's effects and SE's. Screening labs and tests as requested with regular follow-up as recommended.  I provided 35 minutes of face-to-face time during this encounter including counseling, chart review, and critical decision making was preformed.  Today's Plan of Care is based on a patient-centered health care approach known as shared decision making - the decisions, tests and treatments allow for patient preferences and values to be balanced with clinical evidence.     Future Appointments  Date Time Provider Department Center   12/18/2023 11:30 AM Wilkinson, Dana  E, NP [facility] None  01/08/2024 11:15 AM [facility] None  01/08/2024 11:40 AM Lonn Hicks, MD [facility] None  03/25/2024  2:00 PM Tonita Fallow, MD [facility] None  08/13/2024 11:00 AM Laurice President, NP [facility] None     Plan:   During the course of the visit the patient was educated and counseled about appropriate screening and preventive services including:  6 month Influenza vaccine- Screening electrocardiogram Screening mammography- yearly Bone densitometry screening Colorectal cancer screening Diabetes screening- annually Glaucoma screening- yearly Nutrition counseling  Advanced directives:    Subjective:   Kimberly Watson is a 83 y.o. female who presents for Medicare Annual Wellness Visit and 3 month follow up. She has Essential hypertension; Hyperlipidemia, mixed; Vitamin D  deficiency; GERD (gastroesophageal reflux disease); Nephrolithiasis; Osteoporosis; Medication management; Obesity (BMI 30.0-34.9); History of recurrent deep vein thrombosis (DVT); Hiatal hernia; Diverticulosis; Osteoarthritis of right knee; Myeloproliferative disorder (HCC); Chronic anticoagulation (apixaban ) due to DVT/PE; Abnormal glucose; Poor vision; and History of esophageal dilatation on their problem list.  Overall she reports feeling well.  Has no new concerns to address at this time.  Was seen by [facility] 12/14/21 for 1 mo post-op Pseudophakia (new lens).  1 mo post op from CE/IOL.  She was instructed to d/c the inflammatory meds. She was provided a new glass Rx.  She was delighted with her new vision. She was to f/u in 6 mo 05/2022 Dr. Cleatus.  Overall feeling well.  She has R knee bone on bone arthritis, follows with Dr. Melodi, intermittently uses cane at home if needed for instability but reports limited pain. Currently well controlled.  Continues to walk with straight cane.   She was on evista for osteoporosis, she was  taken off due to submassive bilateral saddle PE with right heart strain in 2017. Has had recurrent DVT/PEs. She follows with Dr. Lonn, chronically on eliquis  5 mg BID. She was also diagnosed with myeloproliferative disorder, has therapeutic phlebotomies on hydroxyurea  with apparent good response. She follows as directed.  Now follows with Dr. Trixie for osteoporosis, doing prolia . The DEXA 07/2019 showed  T-3.5 latest in 08/2021 with improved T-Score of -2.5.  She has history of nephrolithiasis, follows with Dr. Ottelin.   Denies any recent flares.   GERD doing well with lifestyle modification. She does have hiatal hernia, hx of esophageal stricture, had dilation x 3, last in 2019.   BMI is Body mass index is 32.24 kg/m., she has not been working on diet and exercise.  Wt Readings from Last 3 Encounters:  08/13/23 182 lb (82.6 kg)  07/10/23 186 lb 9.6 oz (84.6 kg)  03/13/23 180 lb 6.4 oz (81.8 kg)   Her blood pressure has been controlled at home (intermittently elevated due to myeloproliferative disorder), today their BP is BP: (!) 140/82  She denies chest pain, shortness of breath, dizziness.   Her cholesterol is diet controlled.  Her cholesterol is controlled. The cholesterol last visit was:   Lab Results  Component Value Date   CHOL 176 03/13/2023   HDL 55 03/13/2023   LDLCALC 100 (H) 03/13/2023   TRIG 118 03/13/2023   CHOLHDL 3.2 03/13/2023   She has been working on diet and exercise for glucose management, and has no polyuria or polydipsia, no chest pain, dyspnea or TIAs, no numbness, tingling or pain in extremities. Last A1C in the office was:  Lab Results  Component Value Date   HGBA1C 5.5 03/13/2023   She is working to get  in 64 fluid ounces daily. Last GFR:  Lab Results  Component Value Date   GFRNONAA 56 (L) 12/09/2020   Patient is on Vitamin D  supplement.  Lab Results  Component Value Date   VD25OH 51 03/13/2023        Medication Review Current Outpatient  Medications on File Prior to Visit  Medication Sig Dispense Refill   calcium  carbonate (OS-CAL) 600 MG TABS tablet Take 1,200 mg by mouth daily with breakfast.     Cholecalciferol  (DIALYVITE VITAMIN D  5000) 125 MCG (5000 UT) capsule Take 5,000 Units by mouth daily. Take Monday thru Saturday only.     ELIQUIS  2.5 MG TABS tablet TAKE 1 TABLET BY MOUTH TWICE A DAY TO PREVENT BLOOD CLOTS 180 tablet 1   esomeprazole  (NEXIUM ) 40 MG capsule Take  1 capsule  Daily  to Prevent Heartburn & Indigestion 90 capsule 3   hydroxyurea  (HYDREA ) 500 MG capsule TAKE 1 CAPSULE DAILY EXCEPT ON MONDAYS, WEDNESDAYS, FRIDAYS AND SATURDAYS, TAKE 2 CAPSULES 132 capsule 4   MAGNESIUM  PO Take 500 mg by mouth daily.     metoprolol  succinate (TOPROL -XL) 100 MG 24 hr tablet TAKE 1 TABLET BY MOUTH EVERY DAY FOR BLOOD PRESSURE 90 tablet 3   polyvinyl alcohol (LIQUIFILM TEARS) 1.4 % ophthalmic solution Place 1 drop into both eyes as needed for dry eyes.     sodium chloride  (OCEAN) 0.65 % SOLN nasal spray Place 1 spray into both nostrils as needed for congestion.     vitamin C (ASCORBIC ACID) 500 MG tablet Take 500 mg by mouth daily. With meal     No current facility-administered medications on file prior to visit.    Current Problems (verified) Patient Active Problem List   Diagnosis Date Noted   History of esophageal dilatation 11/02/2020   Poor vision 10/02/2019   Abnormal glucose 09/18/2019   Chronic anticoagulation (apixaban ) due to DVT/PE 10/08/2017   Myeloproliferative disorder (HCC) 08/27/2017   Hiatal hernia 08/09/2017   Diverticulosis 08/09/2017   Osteoarthritis of right knee 08/09/2017   History of recurrent deep vein thrombosis (DVT)    Osteoporosis 03/30/2015   Medication management 03/30/2015   Obesity (BMI 30.0-34.9) 03/30/2015   Essential hypertension 06/18/2013   Hyperlipidemia, mixed 06/18/2013   Vitamin D  deficiency 06/18/2013   GERD (gastroesophageal reflux disease) 06/18/2013   Nephrolithiasis  06/18/2013   Screening Tests Immunization History  Administered Date(s) Administered   DT (Pediatric) 03/30/2015   Fluad Quad(high Dose 65+) 07/01/2020   Influenza, High Dose Seasonal PF 04/20/2016, 05/02/2017, 05/20/2018, 05/28/2021, 05/06/2022   PFIZER Comirnaty(Gray Top)Covid-19 Tri-Sucrose Vaccine 12/10/2020   PFIZER(Purple Top)SARS-COV-2 Vaccination 09/13/2019, 10/06/2019, 06/15/2020   Pfizer(Comirnaty)Fall Seasonal Vaccine 12 years and older 05/16/2023   Pneumococcal Conjugate-13 03/25/2014   Tdap 12/27/2018   Zoster, Live 12/27/2005    Preventative care: Last colonoscopy: 10/2017, no polyps, diverticulosis, Dr. Avram, no follow up recommended EGD: 10/2017 Last mammogram: Due 08/2022, scheduled Last pap smear/pelvic exam: remote declines another DEXA: 08/2021 T-Score -2.5 following Dr. Trixie  Prior vaccinations: TD or Tdap: 2016 Influenza: 04/2022 Prevnar 13: 2015 Pneumococcal: 2008  Shingles/Zostavax: 2007 Covid 19: 2/2, 2021, pfizer last boosted 11/2020  Names of Other Physician/Practitioners you currently use: 1. [facility]- here for primary care 2. Dr.Hecker, eye doctor, last visit Glasses/contacts, has follow up planned Feb 2024 3. [facility], last visit 2024, every 6 months  Patient Care Team: Tonita Fallow, MD as PCP - General (Internal Medicine) Cleatus Collar, MD as Consulting Physician (Ophthalmology) Avram,  Lupita BRAVO, MD as Consulting Physician (Gastroenterology) Ottelin, Mark, MD (Inactive) as Consulting Physician (Urology) Meisinger, Krystal, MD as Consulting Physician (Obstetrics and Gynecology)   Allergies Allergies  Allergen Reactions   Evista [Raloxifene] Other (See Comments)    Blood clots - DVT and Pulmonary Embolism    Fosamax [Alendronate Sodium]     esophagitis    SURGICAL HISTORY She  has a past surgical history that includes Joint replacement (Left, total knee); Uterine fibroid surgery;  Cholecystectomy; IVC FILTER PLACEMENT ([facility] HX) (08/2015); Tonsillectomy; cyst removed off spine; IR IVC FILTER PLMT / S&I /IMG GUID/MOD SED (11/19/2020); and Esophagogastroduodenoscopy (egd) with propofol  (N/A, 11/20/2020). FAMILY HISTORY Her family history includes Breast cancer in her mother; Congestive Heart Failure in her mother; Diabetes in her mother; Hypertension in her mother; Osteoporosis in her father. SOCIAL HISTORY She  reports that she has never smoked. She has never used smokeless tobacco. She reports that she does not drink alcohol and does not use drugs.  MEDICARE WELLNESS OBJECTIVES: Physical activity:   Cardiac risk factors:   Depression/mood screen:      03/12/2023   10:34 PM  Depression screen PHQ 2/9  Decreased Interest 0  Down, Depressed, Hopeless 0  PHQ - 2 Score 0    ADLs:     03/12/2023   10:35 PM  In your present state of health, do you have any difficulty performing the following activities:  Hearing? 0  Vision? 0  Difficulty concentrating or making decisions? 0  Walking or climbing stairs? 0  Dressing or bathing? 0  Doing errands, shopping? 0    Cognitive Testing  Alert? Yes  Normal Appearance?Yes  Oriented to person? Yes  Place? Yes   Time? Yes  Recall of three objects?  Yes  Can perform simple calculations? Yes  Displays appropriate judgment?Yes  Can read the correct time from a watch face?Yes  EOL planning:     Review of Systems  Constitutional:  Negative for malaise/fatigue and weight loss.  HENT:  Negative for hearing loss and tinnitus.   Eyes:  Negative for blurred vision and double vision.  Respiratory:  Negative for cough, sputum production, shortness of breath and wheezing.   Cardiovascular:  Negative for chest pain, palpitations, orthopnea, claudication, leg swelling and PND.  Gastrointestinal:  Negative for abdominal pain, blood in stool, constipation, diarrhea, heartburn, melena, nausea and vomiting.  Genitourinary: Negative.    Musculoskeletal:  Positive for joint pain (R knee, postponing surgery ). Negative for back pain, falls and myalgias.  Skin:  Negative for rash.  Neurological:  Negative for dizziness, tingling, sensory change, weakness and headaches.  Endo/Heme/Allergies:  Negative for polydipsia.  Psychiatric/Behavioral: Negative.  Negative for depression, memory loss, substance abuse and suicidal ideas. The patient is not nervous/anxious and does not have insomnia.   All other systems reviewed and are negative.    Objective:   Blood pressure (!) 140/82, pulse 63, temperature (!) 97.4 F (36.3 C), height 5' 3 (1.6 m), weight 182 lb (82.6 kg), SpO2 98%. Body mass index is 32.24 kg/m.  General appearance: alert, no distress, WD/WN,  female HEENT: normocephalic, sclerae anicteric, R canal obstructed by hard wax, L clear, L TM pearly, nares patent, no discharge or erythema, pharynx normal Oral cavity: MMM, no lesions Neck: supple, no lymphadenopathy, no thyromegaly, no masses Heart: RRR, normal S1, S2, no murmurs Lungs: CTA bilaterally, no wheezes, rhonchi, or rales Abdomen: +bs, soft, non tender, non distended, no masses, no hepatomegaly, no splenomegaly Musculoskeletal: nontender, no swelling,  no obvious deformity,  Extremities: no edema, no cyanosis, no clubbing Pulses: 2+ symmetric, upper and lower extremities, normal cap refill Neurological: alert, oriented x 3, CN2-12 intact, strength normal upper extremities and lower extremities, sensation normal throughout, DTRs 2+ throughout, no cerebellar signs, gait antalgic Psychiatric: normal affect, behavior normal, pleasant    Medicare Attestation I have personally reviewed: The patient's medical and social history Their use of alcohol, tobacco or illicit drugs Their current medications and supplements The patient's functional ability including ADLs,fall risks, home safety risks, cognitive, and hearing and visual impairment Diet and physical  activities Evidence for depression or mood disorders  The patient's weight, height, BMI, and visual acuity have been recorded in the chart.  I have made referrals, counseling, and provided education to the patient based on review of the above and I have provided the patient with a written personalized care plan for preventive services.     [facility] NEC[facility], NP   08/13/2023

## 2023-08-13 NOTE — Patient Instructions (Signed)

## 2023-08-14 LAB — LIPID PANEL
Cholesterol: 153 mg/dL (ref <200)
HDL: 57 mg/dL (ref >=50)
LDL Cholesterol (Calc): 78 mg/dL
Non-HDL Cholesterol (Calc): 96 mg/dL (ref <130)
Total CHOL/HDL Ratio: 2.7 (calc) (ref <5.0)
Triglycerides: 101 mg/dL (ref <150)

## 2023-08-14 LAB — COMPLETE METABOLIC PANEL WITH GFR
AG Ratio: 1.5 (calc) (ref 1.0–2.5)
ALT: 11 U/L (ref 6–29)
AST: 16 U/L (ref 10–35)
Albumin: 4.1 g/dL (ref 3.6–5.1)
Alkaline phosphatase (APISO): 50 U/L (ref 37–153)
BUN: 15 mg/dL (ref 7–25)
CO2: 26 mmol/L (ref 20–32)
Calcium: 9.1 mg/dL (ref 8.6–10.4)
Chloride: 104 mmol/L (ref 98–110)
Creat: 0.73 mg/dL (ref 0.60–0.95)
Globulin: 2.8 g/dL (ref 1.9–3.7)
Glucose, Bld: 91 mg/dL (ref 65–99)
Potassium: 4.6 mmol/L (ref 3.5–5.3)
Sodium: 139 mmol/L (ref 135–146)
Total Bilirubin: 0.9 mg/dL (ref 0.2–1.2)
Total Protein: 6.9 g/dL (ref 6.1–8.1)
eGFR: 82 mL/min/{1.73_m2} (ref >=60)

## 2023-08-14 LAB — CBC WITH DIFFERENTIAL/PLATELET
Absolute Lymphocytes: 1389 {cells}/uL (ref 850–3900)
Absolute Monocytes: 432 {cells}/uL (ref 200–950)
Basophils Absolute: 41 {cells}/uL (ref 0–200)
Basophils Relative: 0.9 %
Eosinophils Absolute: 101 {cells}/uL (ref 15–500)
Eosinophils Relative: 2.2 %
HCT: 44.7 % (ref 35.0–45.0)
Hemoglobin: 14.7 g/dL (ref 11.7–15.5)
MCH: 32.8 pg (ref 27.0–33.0)
MCHC: 32.9 g/dL (ref 32.0–36.0)
MCV: 99.8 fL (ref 80.0–100.0)
MPV: 10.5 fL (ref 7.5–12.5)
Monocytes Relative: 9.4 %
Neutro Abs: 2636 {cells}/uL (ref 1500–7800)
Neutrophils Relative %: 57.3 %
Platelets: 221 10*3/uL (ref 140–400)
RBC: 4.48 10*6/uL (ref 3.80–5.10)
RDW: 13.4 % (ref 11.0–15.0)
Total Lymphocyte: 30.2 %
WBC: 4.6 10*3/uL (ref 3.8–10.8)

## 2023-08-14 LAB — HEMOGLOBIN A1C
Hgb A1c MFr Bld: 5.4 %{Hb} (ref <5.7)
Mean Plasma Glucose: 108 mg/dL
eAG (mmol/L): 6 mmol/L

## 2023-10-02 LAB — HM MAMMOGRAPHY

## 2023-10-16 ENCOUNTER — Other Ambulatory Visit: Payer: Self-pay | Admitting: Hematology and Oncology

## 2023-10-19 NOTE — Telephone Encounter (Signed)
 Prolia VOB initiated via [email or web address]  Next Prolia inj DUE: 11/22/23

## 2023-10-30 NOTE — Telephone Encounter (Signed)
 Medical Buy and Bill  Prior Authorization REQUIRED  PA PROCESS DETAILS: PA is required. PA can be initiated by calling [phone] or online at [email or web address]-drugs.

## 2023-11-04 NOTE — Telephone Encounter (Signed)
 Prior Authorization renewal initiated for PROLIA via [email or web address] KEY: [id number]

## 2023-11-06 DIAGNOSIS — D689 Coagulation defect, unspecified: Secondary | ICD-10-CM | POA: Insufficient documentation

## 2023-11-07 ENCOUNTER — Encounter: Payer: Self-pay | Admitting: Family Medicine

## 2023-11-07 ENCOUNTER — Ambulatory Visit: Payer: Medicare PPO | Admitting: Family Medicine

## 2023-11-07 VITALS — BP 138/82 | HR 63 | Temp 98.3°F | Ht 63.0 in | Wt 178.1 lb

## 2023-11-07 DIAGNOSIS — I10 Essential (primary) hypertension: Secondary | ICD-10-CM | POA: Diagnosis not present

## 2023-11-07 DIAGNOSIS — D471 Chronic myeloproliferative disease: Secondary | ICD-10-CM | POA: Diagnosis not present

## 2023-11-07 DIAGNOSIS — Z79899 Other long term (current) drug therapy: Secondary | ICD-10-CM | POA: Insufficient documentation

## 2023-11-07 DIAGNOSIS — Z86718 Personal history of other venous thrombosis and embolism: Secondary | ICD-10-CM

## 2023-11-07 DIAGNOSIS — E559 Vitamin D deficiency, unspecified: Secondary | ICD-10-CM

## 2023-11-07 DIAGNOSIS — D84821 Immunodeficiency due to drugs: Secondary | ICD-10-CM | POA: Insufficient documentation

## 2023-11-07 DIAGNOSIS — K219 Gastro-esophageal reflux disease without esophagitis: Secondary | ICD-10-CM

## 2023-11-07 DIAGNOSIS — E782 Mixed hyperlipidemia: Secondary | ICD-10-CM

## 2023-11-07 DIAGNOSIS — Z7901 Long term (current) use of anticoagulants: Secondary | ICD-10-CM | POA: Diagnosis not present

## 2023-11-07 DIAGNOSIS — D689 Coagulation defect, unspecified: Secondary | ICD-10-CM

## 2023-11-07 DIAGNOSIS — M810 Age-related osteoporosis without current pathological fracture: Secondary | ICD-10-CM

## 2023-11-07 MED ORDER — METOPROLOL SUCCINATE ER 100 MG PO TB24: ORAL_TABLET | ORAL | 3 refills | Status: DC

## 2023-11-07 NOTE — Patient Instructions (Signed)
 Welcome to [facility]!  Thank you for choosing [place] for your Primary Care needs.   We offer in person and video appointments for your convenience. You may call our office to schedule appointments, or you may schedule appointments with me through MyChart.   The best way to get in contact with me is via MyChart message. This will get to me faster than a phone call, unless there is an emergency, then please call 911.  The lab is located downstairs in the Sports Medicine building, we also have xray available there.   Follow up with me in 3 months for medication management and lab work.

## 2023-11-07 NOTE — Assessment & Plan Note (Signed)
 Continue Eliquis as prescribed, f/u with Dr Leafy Half as scheduled

## 2023-11-07 NOTE — Assessment & Plan Note (Signed)
Continue Eliquis as prescribed.

## 2023-11-07 NOTE — Assessment & Plan Note (Signed)
 Continue hydroxyurea, continue to follow-up with hematology as scheduled

## 2023-11-07 NOTE — Assessment & Plan Note (Signed)
 Continue hydroxyurea

## 2023-11-07 NOTE — Assessment & Plan Note (Signed)
Controlled, continue metoprolol

## 2023-11-07 NOTE — Assessment & Plan Note (Signed)
 -  Continue vitamin D and calcium supplementation.

## 2023-11-07 NOTE — Progress Notes (Signed)
 New Patient Office Visit  Subjective    Patient ID: Kimberly Watson, female    DOB: 05/09/1941  Age: 83 y.o. MRN: [id number]  CC:  Chief Complaint  Patient presents with   Establish Care    Over view of health, talk about wanting a new replacement for one of her knee( right knee) possibility of getting it done.    HPI Kimberly Watson presents to establish care today. Reports compliance with medication regimen.  Sees Dr Bertis Ruddy with hematology. Sees Dr Despina Hick with ortho. Sees Dr Wyonia Hough with endocrinology. Has hx DVT, PE. IVC filter in place.  States that she is needing a knee replacement, she is awaiting approval from hematology. States the last time she was off of her blood thinners, she had a blood clot. Is aware she may not be a good candidate for surgery. Declines the needs for refills.  Denies other concerns today.  Outpatient Encounter Medications as of 11/07/2023  Medication Sig   calcium carbonate (OS-CAL) 600 MG TABS tablet Take 1,200 mg by mouth daily with breakfast.   Cholecalciferol (DIALYVITE VITAMIN D 5000) 125 MCG (5000 UT) capsule Take 5,000 Units by mouth daily. Take Monday thru Saturday only.   ELIQUIS 2.5 MG TABS tablet TAKE 1 TABLET BY MOUTH TWICE A DAY TO PREVENT BLOOD CLOTS   esomeprazole (NEXIUM) 40 MG capsule Take  1 capsule  Daily  to Prevent Heartburn & Indigestion   hydroxyurea (HYDREA) 500 MG capsule TAKE 1 CAPSULE DAILY EXCEPT ON MONDAYS, WEDNESDAYS, FRIDAYS AND SATURDAYS, TAKE 2 CAPSULES   MAGNESIUM PO Take 500 mg by mouth daily.   polyvinyl alcohol (LIQUIFILM TEARS) 1.4 % ophthalmic solution Place 1 drop into both eyes as needed for dry eyes.   sodium chloride (OCEAN) 0.65 % SOLN nasal spray Place 1 spray into both nostrils as needed for congestion.   vitamin C (ASCORBIC ACID) 500 MG tablet Take 500 mg by mouth daily. With meal   [DISCONTINUED] metoprolol succinate (TOPROL-XL) 100 MG 24 hr tablet TAKE 1 TABLET BY MOUTH EVERY DAY FOR BLOOD PRESSURE    metoprolol succinate (TOPROL-XL) 100 MG 24 hr tablet TAKE 1 TABLET BY MOUTH EVERY DAY FOR BLOOD PRESSURE   No facility-administered encounter medications on file as of 11/07/2023.    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    Past Surgical History:  Procedure Laterality Date   CHOLECYSTECTOMY     cyst removed off spine     ESOPHAGOGASTRODUODENOSCOPY (EGD) WITH PROPOFOL N/A 11/20/2020   Procedure: ESOPHAGOGASTRODUODENOSCOPY (EGD) WITH PROPOFOL;  Surgeon: Napoleon Form, MD;  Location: [facility];  Service: Endoscopy;  Laterality: N/A;   IR IVC FILTER PLMT / S&I Lenise Arena GUID/MOD SED  11/19/2020  IVC FILTER PLACEMENT ([facility] HX)  08/2015   JOINT REPLACEMENT Left total knee   TONSILLECTOMY     UTERINE FIBROID SURGERY      Family History  Problem Relation Age of Onset   Hypertension Mother    Diabetes Mother    Breast cancer Mother    Congestive Heart Failure Mother        Died age 51   Osteoporosis Father    Colon cancer Neg Hx    Colon polyps Neg Hx    Esophageal cancer Neg Hx    Rectal cancer Neg Hx    Stomach cancer Neg Hx     Social History   Socioeconomic History   Marital status: Divorced    Spouse name: Not on file   Number of children: 2   Years of education: Not on file   Highest education level: Not on file  Occupational  History   Occupation: Runner, broadcasting/film/video    Comment: Retired  Tobacco Use   Smoking status: Never   Smokeless tobacco: Never  Vaping Use   Vaping status: Never Used  Substance and Sexual Activity   Alcohol use: No   Drug use: No   Sexual activity: Not on file    Comment: retired, next of kin, 1 son and daughter.  Other Topics Concern   Not on file  Social History Narrative   Divorced, 2 children lives alone.  Lived with a smoker for years.    Retired Chartered loss adjuster   No alcohol tobacco or drug use   10/08/2017      Social Drivers of Corporate investment banker Strain: Not on file  Food Insecurity: Not on file  Transportation Needs: Not on file  Physical Activity: Not on file  Stress: Not on file  Social Connections: Not on file  Intimate Partner Violence: Not on file    ROS Per HPI      Objective    BP 138/82 (BP Location: Left Arm, Patient Position: Sitting)   Pulse 63   Temp 98.3 F (36.8 C) (Temporal)   Ht 5\' 3"  (1.6 m)   Wt 178 lb 2 oz (80.8 kg)   SpO2 94%   BMI 31.55 kg/m   Physical Exam Vitals and nursing note reviewed.  Constitutional:      General: She is not in acute distress.    Appearance: Normal appearance. She is obese.  HENT:     Head: Normocephalic and atraumatic.     Right Ear: External ear normal.     Left Ear: External ear normal.     Nose: Nose normal.     Mouth/Throat:     Mouth: Mucous membranes are moist.     Pharynx: Oropharynx is clear.  Eyes:     Extraocular Movements: Extraocular movements intact.     Pupils: Pupils are equal, round, and reactive to light.  Cardiovascular:     Rate and Rhythm: Normal rate and regular rhythm.     Pulses: Normal pulses.     Heart sounds: Normal heart sounds.  Pulmonary:     Effort: Pulmonary effort is normal. No respiratory distress.     Breath sounds: Normal breath sounds. No wheezing, rhonchi or rales.  Musculoskeletal:        General: Normal range of motion.     Cervical back: Normal range of  motion.     Right lower leg: No edema.     Left lower leg: No edema.  Lymphadenopathy:     Cervical:  No cervical adenopathy.  Neurological:     General: No focal deficit present.     Mental Status: She is alert and oriented to person, place, and time.  Psychiatric:        Mood and Affect: Mood normal.        Thought Content: Thought content normal.         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 Assessment & Plan: Continue Eliquis, f/u with hematology as scheduled   Myeloproliferative disorder [facility]) Assessment & Plan: Continue hydroxyurea, continue to follow-up with hematology as scheduled   History of recurrent deep vein thrombosis (DVT) Assessment & Plan: Continue Eliquis as prescribed, f/u with Dr Leafy Half as scheduled   Gastroesophageal reflux disease without esophagitis Assessment & Plan: Controlled, continue Nexium as needed   Acquired coagulation disorder (HCC) Assessment & Plan: Continue Eliquis as prescribed   Immunocompromised state due to drug therapy [facility]) Assessment & Plan: Continue hydroxyurea   Hyperlipidemia, mixed Assessment & Plan: Continue efforts and low-fat diet, will check labs at next visit   Age-related osteoporosis without current pathological fracture Assessment & Plan: Continue vitamin D and calcium supplementation   Vitamin D deficiency Assessment & Plan: Continue vitamin D supplementation, will check labs at next visit      Return in about 3 months (around 02/06/2024) for meds/labs.   Sherald Barge, FNP

## 2023-11-07 NOTE — Assessment & Plan Note (Signed)
 Controlled, continue Nexium as needed

## 2023-11-07 NOTE — Assessment & Plan Note (Signed)
 Continue efforts and low-fat diet, will check labs at next visit

## 2023-11-07 NOTE — Assessment & Plan Note (Signed)
 Continue Eliquis, f/u with hematology as scheduled

## 2023-11-07 NOTE — Assessment & Plan Note (Signed)
 Continue vitamin D supplementation, will check labs at next visit

## 2023-11-08 NOTE — Telephone Encounter (Signed)
 [employer]  Prior Authorization APPROVED PA# [id number] Valid: 01/24/22-07/30/24

## 2023-11-14 NOTE — Telephone Encounter (Signed)
 Patient is scheduled for 11/27/2023 for Prolia injection.

## 2023-11-14 NOTE — Telephone Encounter (Signed)
 Pt ready for scheduling on or after 11/22/23  Out-of-pocket cost due at time of visit: $40  Primary: [employer] Medicare Adv [facility] PPO Prolia co-insurance: $40 Admin fee co-insurance: 0%  Deductible: does not apply  Prior Auth: APPROVED PA# [id number] Valid: 01/24/22-07/30/24  Secondary: N/A Prolia co-insurance:  Admin fee co-insurance:  Deductible:  Prior Auth:  PA# Valid:     ** This summary of benefits is an estimation of the patient's out-of-pocket cost. Exact cost may very based on individual plan coverage.

## 2023-11-27 ENCOUNTER — Ambulatory Visit

## 2023-11-27 DIAGNOSIS — M810 Age-related osteoporosis without current pathological fracture: Secondary | ICD-10-CM | POA: Diagnosis not present

## 2023-11-27 MED ORDER — DENOSUMAB 60 MG/ML ~~LOC~~ SOSY
60.0000 mg | PREFILLED_SYRINGE | Freq: Once | SUBCUTANEOUS | Status: AC
Start: 2023-11-27 — End: 2023-11-27
  Administered 2023-11-27: 60 mg via SUBCUTANEOUS

## 2023-11-27 MED ORDER — DENOSUMAB 60 MG/ML ~~LOC~~ SOSY
60.0000 mg | PREFILLED_SYRINGE | Freq: Once | SUBCUTANEOUS | Status: AC
  Administered 2024-06-13: 60 mg via SUBCUTANEOUS

## 2023-11-27 NOTE — Progress Notes (Signed)
After obtaining consent, and per orders of Dr. Elvera Lennox, injection of Prolia given by Pollie Meyer. Patient instructed to remain in clinic for 20 minutes afterwards, and to report any adverse reaction to me immediately.

## 2023-11-30 NOTE — Telephone Encounter (Signed)
 Last Prolia inj 11/27/23 Next Prolia inj due 05/29/24

## 2023-12-18 ENCOUNTER — Ambulatory Visit: Payer: Medicare PPO | Admitting: Nurse Practitioner

## 2024-01-08 ENCOUNTER — Inpatient Hospital Stay: Payer: Medicare PPO | Attending: Hematology and Oncology

## 2024-01-08 ENCOUNTER — Inpatient Hospital Stay: Payer: Medicare PPO | Admitting: Hematology and Oncology

## 2024-01-08 ENCOUNTER — Encounter: Payer: Self-pay | Admitting: Hematology and Oncology

## 2024-01-08 VITALS — BP 142/84 | HR 65 | Resp 18 | Ht 63.0 in | Wt 177.0 lb

## 2024-01-08 DIAGNOSIS — D471 Chronic myeloproliferative disease: Secondary | ICD-10-CM

## 2024-01-08 DIAGNOSIS — Z86718 Personal history of other venous thrombosis and embolism: Secondary | ICD-10-CM | POA: Diagnosis not present

## 2024-01-08 DIAGNOSIS — L03115 Cellulitis of right lower limb: Secondary | ICD-10-CM | POA: Diagnosis not present

## 2024-01-08 DIAGNOSIS — L03116 Cellulitis of left lower limb: Secondary | ICD-10-CM | POA: Insufficient documentation

## 2024-01-08 DIAGNOSIS — K250 Acute gastric ulcer with hemorrhage: Secondary | ICD-10-CM

## 2024-01-08 DIAGNOSIS — I82402 Acute embolism and thrombosis of unspecified deep veins of left lower extremity: Secondary | ICD-10-CM

## 2024-01-08 LAB — CBC WITH DIFFERENTIAL/PLATELET
Abs Immature Granulocytes: 0.02 10*3/uL (ref 0.00–0.07)
Basophils Absolute: 0 10*3/uL (ref 0.0–0.1)
Basophils Relative: 1 %
Eosinophils Absolute: 0.1 10*3/uL (ref 0.0–0.5)
Eosinophils Relative: 2 %
HCT: 43.2 % (ref 36.0–46.0)
Hemoglobin: 14.3 g/dL (ref 12.0–15.0)
Immature Granulocytes: 0 %
Lymphocytes Relative: 30 %
Lymphs Abs: 1.3 10*3/uL (ref 0.7–4.0)
MCH: 34.7 pg — ABNORMAL HIGH (ref 26.0–34.0)
MCHC: 33.1 g/dL (ref 30.0–36.0)
MCV: 104.9 fL — ABNORMAL HIGH (ref 80.0–100.0)
Monocytes Absolute: 0.5 10*3/uL (ref 0.1–1.0)
Monocytes Relative: 10 %
Neutro Abs: 2.5 10*3/uL (ref 1.7–7.7)
Neutrophils Relative %: 57 %
Platelets: 216 10*3/uL (ref 150–400)
RBC: 4.12 MIL/uL (ref 3.87–5.11)
RDW: 13.4 % (ref 11.5–15.5)
WBC: 4.5 10*3/uL (ref 4.0–10.5)
nRBC: 0 % (ref 0.0–0.2)

## 2024-01-08 MED ORDER — APIXABAN 2.5 MG PO TABS: 2.5000 mg | ORAL_TABLET | Freq: Two times a day (BID) | ORAL | 1 refills | Status: DC

## 2024-01-08 MED ORDER — CEPHALEXIN 500 MG PO CAPS: 500.0000 mg | ORAL_CAPSULE | Freq: Two times a day (BID) | ORAL | 0 refills | Status: DC

## 2024-01-08 MED ORDER — HYDROXYUREA 500 MG PO CAPS: ORAL_CAPSULE | ORAL | 6 refills | Status: AC

## 2024-01-08 NOTE — Assessment & Plan Note (Addendum)
 She has signs of bilateral lower extremity cellulitis I recommend a course of antibiotics I took pictures of her legs on her phone and we will call her again in 3 days to assess

## 2024-01-08 NOTE — Progress Notes (Signed)
 [facility] OFFICE PROGRESS NOTE  Patient Care Team: Wellington Half, FNP as PCP - General (Family Medicine) Amedeo Jupiter, MD as Consulting Physician (Ophthalmology) Kenney Peacemaker, MD as Consulting Physician (Gastroenterology) Trudee Furth, MD (Inactive) as Consulting Physician (Urology) Meisinger, Ena Harries, MD as Consulting Physician (Obstetrics and Gynecology)  Assessment & Plan Myeloproliferative disorder [facility]) She is compliant taking medication as directed CBC is normal She will continue current dose of hydroxyurea  without changes I plan to see her again in 5-6 months for further follow-up History of recurrent deep vein thrombosis (DVT) She denies bleeding complication from Eliquis  She will continue treatment indefinitely due to high risk of recurrent thrombosis Refill her prescription today Bilateral cellulitis of lower leg She has signs of bilateral lower extremity cellulitis I recommend a course of antibiotics I took pictures of her legs on her phone and we will call her again in 3 days to assess  No orders of the defined types were placed in this encounter.    Almeda Jacobs, MD  INTERVAL HISTORY: she returns for surveillance follow-up for myeloproliferative disorder/neoplasm Patient denies recent bleeding such as epistaxis, hematuria or hematochezia No recent infection We reviewed medication list and discussed medication changes We discussed test results and future plan of care as outlined above In addition, she complained of lower extremity redness, warmth and intermittent discomfort with associated edema She has chronic intermittent lower extremity swelling but most recently, noted some abnormalities She denies fever or chills  PHYSICAL EXAMINATION: ECOG PERFORMANCE STATUS: 1 - Symptomatic but completely ambulatory  Vitals:   01/08/24 1145  BP: (!) 142/84  Pulse: 65  Resp: 18  SpO2: 97%   Lab Results  Component Value Date   WBC 4.5  01/08/2024   HGB 14.3 01/08/2024   HCT 43.2 01/08/2024   MCV 104.9 (H) 01/08/2024   PLT 216 01/08/2024   Examination of bilateral lower extremity confirm diagnosis of cellulitis.  Pictures were taken with her phone

## 2024-01-08 NOTE — Assessment & Plan Note (Addendum)
 She denies bleeding complication from Eliquis  She will continue treatment indefinitely due to high risk of recurrent thrombosis Refill her prescription today

## 2024-01-08 NOTE — Assessment & Plan Note (Addendum)
 She is compliant taking medication as directed CBC is normal She will continue current dose of hydroxyurea without changes I plan to see her again in 5-6 months for further follow-up

## 2024-01-10 ENCOUNTER — Ambulatory Visit

## 2024-01-10 VITALS — BP 130/90 | HR 65 | Ht 61.5 in | Wt 175.0 lb

## 2024-01-10 DIAGNOSIS — Z0000 Encounter for general adult medical examination without abnormal findings: Secondary | ICD-10-CM

## 2024-01-10 DIAGNOSIS — Z79899 Other long term (current) drug therapy: Secondary | ICD-10-CM | POA: Diagnosis not present

## 2024-01-10 NOTE — Patient Instructions (Signed)
 Ms. Kimberly Watson , Thank you for taking time out of your busy schedule to complete your Annual Wellness Visit with me. I enjoyed our conversation and look forward to speaking with you again next year. I, as well as your care team,  appreciate your ongoing commitment to your health goals. Please review the following plan we discussed and let me know if I can assist you in the future. Your Game plan/ To Do List   Follow up Visits: Next Medicare AWV with our clinical staff: 01/13/2025.   Have you seen your provider in the last 6 months (3 months if uncontrolled diabetes)? Yes Next Office Visit with your provider: 02/07/2024.  Clinician Recommendations:  Aim for 30 minutes of exercise or brisk walking, 6-8 glasses of water, and 5 servings of fruits and vegetables each day. You are due for a Shingles vaccine and you can get that done at your pharmacy.  You are also due for a pneumonia vaccine and can get that during your next office visit.  Please call to get scheduled for a Bone density, by calling Endocrinology.       This is a list of the screening recommended for you and due dates:  Health Maintenance  Topic Date Due   Zoster (Shingles) Vaccine (1 of 2) 03/14/1960   Pneumococcal Vaccine for age over 66 (2 of 2 - PPSV23) 05/20/2014   COVID-19 Vaccine (5 - 2024-25 season) 04/01/2023   DEXA scan (bone density measurement)  09/23/2023   Mammogram  09/28/2023   Flu Shot  02/29/2024   Medicare Annual Wellness Visit  08/12/2024   DTaP/Tdap/Td vaccine (3 - Td or Tdap) 12/26/2028   HPV Vaccine  Aged Out   Meningitis B Vaccine  Aged Out    Advanced directives: (Copy Requested) Please bring a copy of your health care power of attorney and living will to the office to be added to your chart at your convenience. You can mail to [facility] [address] [place], [place] [address] or email to [email or web address] Advance Care Planning is important because it:  [x]  Makes sure you  receive the medical care that is consistent with your values, goals, and preferences  [x]  It provides guidance to your family and loved ones and reduces their decisional burden about whether or not they are making the right decisions based on your wishes.  Follow the link provided in your after visit summary or read over the paperwork we have mailed to you to help you started getting your Advance Directives in place. If you need assistance in completing these, please reach out to us  so that we can help you!  See attachments for Preventive Care and Fall Prevention Tips.

## 2024-01-10 NOTE — Progress Notes (Signed)
 Subjective:   Kimberly Watson is a 83 y.o. who presents for a Medicare Wellness preventive visit.  As a reminder, Annual Wellness Visits don't include a physical exam, and some assessments may be limited, especially if this visit is performed virtually. We may recommend an in-person follow-up visit with your provider if needed.  Visit Complete: In person  Persons Participating in Visit: Patient.  AWV Questionnaire: No: Patient Medicare AWV questionnaire was not completed prior to this visit.  Cardiac Risk Factors include: advanced age (>60men, >78 women);hypertension;dyslipidemia;obesity (BMI >30kg/m2)     Objective:    Today's Vitals   01/10/24 1109 01/10/24 1110  BP: (!) 130/90   Pulse: 65   SpO2: 97%   Weight: 175 lb (79.4 kg)   Height: 5' 1.5 (1.562 m)   PainSc:  2    Body mass index is 32.53 kg/m.     01/10/2024   11:21 AM 08/10/2022   11:39 AM 08/04/2021    3:12 PM 05/10/2021    3:00 PM 11/20/2020    7:16 AM 11/19/2020    3:20 PM 11/19/2020   10:45 AM  Advanced Directives  Does Patient Have a Medical Advance Directive? Yes Yes Yes Yes Yes Yes Yes  Type of Estate agent of [place];Living will Living will Healthcare Power of [place];Living will  Healthcare Power of [place];Living will Healthcare Power of [place];Living will Healthcare Power of [place];Living will  Does patient want to make changes to medical advance directive?   No - Patient declined No - Patient declined  No - Patient declined   Copy of Healthcare Power of Attorney in Chart? No - copy requested  No - copy requested  No - copy requested No - copy requested     Current Medications (verified) Outpatient Encounter Medications as of 01/10/2024  Medication Sig   apixaban  (ELIQUIS ) 2.5 MG TABS tablet Take 1 tablet (2.5 mg total) by mouth 2 (two) times daily.   calcium  carbonate (OS-CAL) 600 MG TABS tablet Take 1,200 mg by mouth daily with breakfast.   cephALEXin  (KEFLEX ) 500 MG  capsule Take 1 capsule (500 mg total) by mouth 2 (two) times daily.   Cholecalciferol  (DIALYVITE VITAMIN D  5000) 125 MCG (5000 UT) capsule Take 5,000 Units by mouth daily. Take Monday thru Saturday only.   esomeprazole  (NEXIUM ) 40 MG capsule Take  1 capsule  Daily  to Prevent Heartburn & Indigestion   hydroxyurea  (HYDREA ) 500 MG capsule Take 1 capsule daily except on Mondays, Wednesdays, Fridays and Saturdays, take 2 capsules   MAGNESIUM  PO Take 500 mg by mouth daily. (Patient taking differently: Take 500 mg by mouth as needed. Taking 5 tablets every week)   metoprolol  succinate (TOPROL -XL) 100 MG 24 hr tablet TAKE 1 TABLET BY MOUTH EVERY DAY FOR BLOOD PRESSURE   polyvinyl alcohol (LIQUIFILM TEARS) 1.4 % ophthalmic solution Place 1 drop into both eyes as needed for dry eyes.   sodium chloride  (OCEAN) 0.65 % SOLN nasal spray Place 1 spray into both nostrils as needed for congestion.   vitamin C (ASCORBIC ACID) 500 MG tablet Take 500 mg by mouth daily. With meal   Facility-Administered Encounter Medications as of 01/10/2024  Medication   [START ON 05/28/2024] denosumab  (PROLIA ) injection 60 mg    Allergies (verified) Evista [raloxifene] and Fosamax [alendronate sodium]   History: Past Medical History:  Diagnosis Date   Acute gastric ulcer with hemorrhage    AKI (acute kidney injury) (HCC) 08/19/2015   Anemia due to gastrointestinal  blood loss 11/03/2020   Cataract    Clotting disorder (HCC)    DVT of lower extremity (deep venous thrombosis) (HCC)    left, resolved   Elevated hemoglobin A1c    GERD (gastroesophageal reflux disease)    GI bleed due to NSAIDs    H/O left knee surgery    Hypertension    JAK2 gene mutation    Leiomyoma    adnexal Leiomyoma removal in 1/11   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   Past Surgical History:  Procedure  Laterality Date   CHOLECYSTECTOMY     cyst removed off spine     ESOPHAGOGASTRODUODENOSCOPY (EGD) WITH PROPOFOL  N/A 11/20/2020   Procedure: ESOPHAGOGASTRODUODENOSCOPY (EGD) WITH PROPOFOL ;  Surgeon: Sergio Dandy, MD;  Location: [facility];  Service: Endoscopy;  Laterality: N/A;   IR IVC FILTER PLMT / S&I /IMG GUID/MOD SED  11/19/2020   IVC FILTER PLACEMENT ([facility] HX)  08/2015   JOINT REPLACEMENT Left total knee   TONSILLECTOMY     UTERINE FIBROID SURGERY     Family History  Problem Relation Age of Onset   Hypertension Mother    Diabetes Mother    Breast cancer Mother    Congestive Heart Failure Mother        Died age 25   Osteoporosis Father    Colon cancer Neg Hx    Colon polyps Neg Hx    Esophageal cancer Neg Hx    Rectal cancer Neg Hx    Stomach cancer Neg Hx    Social History   Socioeconomic History   Marital status: Divorced    Spouse name: Not on file   Number of children: 2   Years of education: Not on file   Highest education level: Not on file  Occupational History   Occupation: Runner, broadcasting/film/video    Comment: Retired  Tobacco Use   Smoking status: Never   Smokeless tobacco: Never  Vaping Use   Vaping status: Never Used  Substance and Sexual Activity   Alcohol use: No   Drug use: No   Sexual activity: Not on file    Comment: retired, next of kin, 1 son and daughter.  Other Topics Concern   Not on file  Social History Narrative   Divorced, 2 children lives alone.  Lived with a smoker for years.    Retired Chartered loss adjuster   No alcohol tobacco or drug use   10/08/2017      Lives alone/2025      Social Drivers of Health   Financial Resource Strain: Low Risk  (01/10/2024)   Overall Financial Resource Strain (CARDIA)    Difficulty of Paying Living Expenses: Not hard at all  Food Insecurity: No Food Insecurity (01/10/2024)   Hunger Vital Sign    Worried About Running Out of Food in the Last Year: Never true    Ran Out of Food in the Last Year: Never true   Transportation Needs: No Transportation Needs (01/10/2024)   PRAPARE - Administrator, Civil Service (Medical): No    Lack of Transportation (Non-Medical): No  Physical Activity: Inactive (01/10/2024)   Exercise Vital Sign    Days of Exercise per Week: 0 days    Minutes of Exercise per Session: 0 min  Stress: No Stress Concern Present (01/10/2024)   [employer] of Occupational Health -  Occupational Stress Questionnaire    Feeling of Stress: Only a little  Social Connections: Moderately Integrated (01/10/2024)   Social Connection and Isolation Panel    Frequency of Communication with Friends and Family: Twice a week    Frequency of Social Gatherings with Friends and Family: Once a week    Attends Religious Services: More than 4 times per year    Active Member of [employer] or Organizations: Yes    Attends Engineer, structural: Not on file    Marital Status: Divorced    Tobacco Counseling Counseling given: Not Answered    Clinical Intake:  Pre-visit preparation completed: Yes  Pain : 0-10 (Has some slight pain in Rt side of neck, shoulder and back) Pain Score: 2  Pain Type: Acute pain Pain Location: Back (Neck, shoulder, back and elbow) Pain Orientation: Right Pain Descriptors / Indicators: Aching, Discomfort Pain Onset: More than a month ago Pain Frequency: Intermittent Pain Relieving Factors: Tylenol  sometimes  Pain Relieving Factors: Tylenol  sometimes  BMI - recorded: 32.53 Nutritional Status: BMI > 30  Obese Nutritional Risks: None Diabetes: No  Lab Results  Component Value Date   HGBA1C 5.4 08/13/2023   HGBA1C 5.5 03/13/2023   HGBA1C 5.5 12/05/2022     How often do you need to have someone help you when you read instructions, pamphlets, or other written materials from your doctor or pharmacy?: 1 - Never  Interpreter Needed?: No  Information entered by :: Reiko Vinje, RMa   Activities of Daily Living     01/10/2024   11:13 AM  08/13/2023   12:37 PM  In your present state of health, do you have any difficulty performing the following activities:  Hearing? 0 0  Vision? 0 0  Difficulty concentrating or making decisions? 0 0  Walking or climbing stairs? 0 1  Comment  Uses staright cane  Dressing or bathing? 0 0  Doing errands, shopping? 0 0  Preparing Food and eating ? N   Using the Toilet? N   In the past six months, have you accidently leaked urine? N   Do you have problems with loss of bowel control? N   Managing your Medications? N   Managing your Finances? N   Housekeeping or managing your Housekeeping? N     Patient Care Team: Wellington Half, FNP as PCP - General (Family Medicine) Amedeo Jupiter, MD as Consulting Physician (Ophthalmology) Kenney Peacemaker, MD as Consulting Physician (Gastroenterology) Trudee Furth, MD (Inactive) as Consulting Physician (Urology) Meisinger, Ena Harries, MD as Consulting Physician (Obstetrics and Gynecology)  I have updated your Care Teams any recent Medical Services you may have received from other providers in the past year.     Assessment:   This is a routine wellness examination for Anjanette.  Hearing/Vision screen Hearing Screening - Comments:: Wears hearing aides Vision Screening - Comments:: Wears eyeglasses/Dr. Lasandra Points   Goals Addressed             This Visit's Progress    Patient Stated       To stay active/ will be joining a gym soon./2025       Depression Screen     01/10/2024   11:27 AM 11/07/2023    8:49 AM 08/13/2023   12:37 PM 03/12/2023   10:34 PM 08/10/2022    1:37 PM 03/09/2022    9:36 PM 08/04/2021    3:22 PM  PHQ 2/9 Scores  PHQ - 2 Score 0 0 0 0 0 0 0  PHQ- 9 Score 0          Fall Risk     01/10/2024   11:21 AM 11/07/2023    8:49 AM 08/13/2023   12:37 PM 03/12/2023   10:34 PM 08/10/2022    1:36 PM  Fall Risk   Falls in the past year? 0 0 0 0 0  Number falls in past yr: 0 0     Injury with Fall? 0 0     Risk for fall due to :  No  Fall Risks  No Fall Risks   Follow up Falls evaluation completed;Falls prevention discussed Falls evaluation completed  Falls prevention discussed;Education provided;Falls evaluation completed     MEDICARE RISK AT HOME:  Medicare Risk at Home Any stairs in or around the home?: No If so, are there any without handrails?: No Home free of loose throw rugs in walkways, pet beds, electrical cords, etc?: Yes Adequate lighting in your home to reduce risk of falls?: Yes Life alert?: No Use of a cane, walker or w/c?: Yes (cane) Grab bars in the bathroom?: Yes Shower chair or bench in shower?: Yes Elevated toilet seat or a handicapped toilet?: Yes  TIMED UP AND GO:  Was the test performed?  Yes  Length of time to ambulate 10 feet: 20 sec Gait slow and steady without use of assistive device  Cognitive Function: Declined/Normal: No cognitive concerns noted by patient or family. Patient alert, oriented, able to answer questions appropriately and recall recent events. No signs of memory loss or confusion.        Immunizations Immunization History  Administered Date(s) Administered   DT (Pediatric) 03/30/2015   Fluad Quad(high Dose 65+) 07/01/2020   Influenza, High Dose Seasonal PF 04/20/2016, 05/02/2017, 05/20/2018, 05/28/2021, 05/06/2022   PFIZER Comirnaty(Gray Top)Covid-19 Tri-Sucrose Vaccine 12/10/2020   PFIZER(Purple Top)SARS-COV-2 Vaccination 09/13/2019, 10/06/2019, 06/15/2020   Pneumococcal Conjugate-13 03/25/2014   Tdap 12/27/2018   Zoster, Live 12/27/2005    Screening Tests Health Maintenance  Topic Date Due   Zoster Vaccines- Shingrix (1 of 2) 03/14/1960   Pneumococcal Vaccine: 50+ Years (2 of 2 - PPSV23) 05/20/2014   COVID-19 Vaccine (5 - 2024-25 season) 04/01/2023   DEXA SCAN  09/23/2023   MAMMOGRAM  09/28/2023   INFLUENZA VACCINE  02/29/2024   Medicare Annual Wellness (AWV)  01/09/2025   DTaP/Tdap/Td (3 - Td or Tdap) 12/26/2028   HPV VACCINES  Aged Out    Meningococcal B Vaccine  Aged Out    Health Maintenance  Health Maintenance Due  Topic Date Due   Zoster Vaccines- Shingrix (1 of 2) 03[birth year]14/1960   Pneumococcal Vaccine: 50+ Years (2 of 2 - PPSV23) 05/20/2014   COVID-19 Vaccine (5 - 2024-25 season) 04/01/2023   DEXA SCAN  09/23/2023   MAMMOGRAM  09/28/2023   Health Maintenance Items Addressed: See Nurse Notes at the end of this note  Additional Screening:  Vision Screening: Recommended annual ophthalmology exams for early detection of glaucoma and other disorders of the eye. Would you like a referral to an eye doctor? No    Dental Screening: Recommended annual dental exams for proper oral hygiene  Community Resource Referral / Chronic Care Management: CRR required this visit?  No   CCM required this visit?  No   Plan:    I have personally reviewed and noted the following in the patient's chart:   Medical and social history Use of alcohol, tobacco or illicit drugs  Current medications and supplements including opioid prescriptions. Patient is  not currently taking opioid prescriptions. Functional ability and status Nutritional status Physical activity Advanced directives List of other physicians Hospitalizations, surgeries, and ER visits in previous 12 months Vitals Screenings to include cognitive, depression, and falls Referrals and appointments  In addition, I have reviewed and discussed with patient certain preventive protocols, quality metrics, and best practice recommendations. A written personalized care plan for preventive services as well as general preventive health recommendations were provided to patient.   Ashly Goethe L Timo Hartwig, CMA   01/10/2024   After Visit Summary: (In Person-Printed) AVS printed and given to the patient  Notes: Please refer to Routing Comments.

## 2024-01-11 ENCOUNTER — Telehealth: Payer: Self-pay

## 2024-01-11 NOTE — Telephone Encounter (Signed)
 Called and given below message. She verbalized understanding. Cellulitis is better and she will call the office back for questions/ concerns.

## 2024-01-11 NOTE — Telephone Encounter (Signed)
-----   Message from Almeda Jacobs sent at 01/11/2024  8:29 AM EDT ----- Can you call her and ask if her cellulitis is better? If not better, tell her to call PCP

## 2024-01-17 ENCOUNTER — Encounter: Payer: Self-pay | Admitting: Family Medicine

## 2024-02-06 ENCOUNTER — Ambulatory Visit: Admitting: Family Medicine

## 2024-02-07 ENCOUNTER — Encounter: Payer: Self-pay | Admitting: Family Medicine

## 2024-02-07 ENCOUNTER — Ambulatory Visit: Admitting: Family Medicine

## 2024-02-07 VITALS — BP 150/80 | HR 63 | Temp 98.2°F | Ht 61.5 in | Wt 175.0 lb

## 2024-02-07 DIAGNOSIS — D84821 Immunodeficiency due to drugs: Secondary | ICD-10-CM | POA: Diagnosis not present

## 2024-02-07 DIAGNOSIS — Z79899 Other long term (current) drug therapy: Secondary | ICD-10-CM | POA: Diagnosis not present

## 2024-02-07 DIAGNOSIS — Z7901 Long term (current) use of anticoagulants: Secondary | ICD-10-CM

## 2024-02-07 DIAGNOSIS — D471 Chronic myeloproliferative disease: Secondary | ICD-10-CM | POA: Diagnosis not present

## 2024-02-07 DIAGNOSIS — E66811 Obesity, class 1: Secondary | ICD-10-CM

## 2024-02-07 DIAGNOSIS — R7309 Other abnormal glucose: Secondary | ICD-10-CM | POA: Diagnosis not present

## 2024-02-07 DIAGNOSIS — K449 Diaphragmatic hernia without obstruction or gangrene: Secondary | ICD-10-CM

## 2024-02-07 DIAGNOSIS — D6859 Other primary thrombophilia: Secondary | ICD-10-CM

## 2024-02-07 DIAGNOSIS — Z86718 Personal history of other venous thrombosis and embolism: Secondary | ICD-10-CM

## 2024-02-07 DIAGNOSIS — E6609 Other obesity due to excess calories: Secondary | ICD-10-CM

## 2024-02-07 DIAGNOSIS — E559 Vitamin D deficiency, unspecified: Secondary | ICD-10-CM | POA: Diagnosis not present

## 2024-02-07 DIAGNOSIS — I10 Essential (primary) hypertension: Secondary | ICD-10-CM

## 2024-02-07 DIAGNOSIS — K219 Gastro-esophageal reflux disease without esophagitis: Secondary | ICD-10-CM

## 2024-02-07 DIAGNOSIS — M810 Age-related osteoporosis without current pathological fracture: Secondary | ICD-10-CM

## 2024-02-07 LAB — CBC WITH DIFFERENTIAL/PLATELET
Basophils Absolute: 0 K/uL (ref 0.0–0.1)
Basophils Relative: 1 % (ref 0.0–3.0)
Eosinophils Absolute: 0.1 K/uL (ref 0.0–0.7)
Eosinophils Relative: 2.3 % (ref 0.0–5.0)
HCT: 45.2 % (ref 36.0–46.0)
Hemoglobin: 14.8 g/dL (ref 12.0–15.0)
Lymphocytes Relative: 32.4 % (ref 12.0–46.0)
Lymphs Abs: 1.5 K/uL (ref 0.7–4.0)
MCHC: 32.8 g/dL (ref 30.0–36.0)
MCV: 105.7 fl — ABNORMAL HIGH (ref 78.0–100.0)
Monocytes Absolute: 0.5 K/uL (ref 0.1–1.0)
Monocytes Relative: 9.9 % (ref 3.0–12.0)
Neutro Abs: 2.6 K/uL (ref 1.4–7.7)
Neutrophils Relative %: 54.4 % (ref 43.0–77.0)
Platelets: 222 K/uL (ref 150.0–400.0)
RBC: 4.28 Mil/uL (ref 3.87–5.11)
RDW: 14.2 % (ref 11.5–15.5)
WBC: 4.7 K/uL (ref 4.0–10.5)

## 2024-02-07 LAB — COMPREHENSIVE METABOLIC PANEL WITH GFR
ALT: 13 U/L (ref 0–35)
AST: 17 U/L (ref 0–37)
Albumin: 4.3 g/dL (ref 3.5–5.2)
Alkaline Phosphatase: 48 U/L (ref 39–117)
BUN: 17 mg/dL (ref 6–23)
CO2: 30 meq/L (ref 19–32)
Calcium: 9.5 mg/dL (ref 8.4–10.5)
Chloride: 104 meq/L (ref 96–112)
Creatinine, Ser: 0.85 mg/dL (ref 0.40–1.20)
GFR: 63.14 mL/min (ref >60.00)
Glucose, Bld: 87 mg/dL (ref 70–99)
Potassium: 4.4 meq/L (ref 3.5–5.1)
Sodium: 138 meq/L (ref 135–145)
Total Bilirubin: 0.8 mg/dL (ref 0.2–1.2)
Total Protein: 7.5 g/dL (ref 6.0–8.3)

## 2024-02-07 LAB — LIPID PANEL
Cholesterol: 157 mg/dL (ref 0–200)
HDL: 55.8 mg/dL (ref >39.00)
LDL Cholesterol: 78 mg/dL (ref 0–99)
NonHDL: 101.1
Total CHOL/HDL Ratio: 3
Triglycerides: 116 mg/dL (ref 0.0–149.0)
VLDL: 23.2 mg/dL (ref 0.0–40.0)

## 2024-02-07 LAB — HEMOGLOBIN A1C: Hgb A1c MFr Bld: 5.5 % (ref 4.6–6.5)

## 2024-02-07 LAB — VITAMIN D 25 HYDROXY (VIT D DEFICIENCY, FRACTURES): VITD: 40.79 ng/mL (ref 30.00–100.00)

## 2024-02-07 MED ORDER — ESOMEPRAZOLE MAGNESIUM 40 MG PO CPDR: DELAYED_RELEASE_CAPSULE | ORAL | 3 refills | Status: AC

## 2024-02-07 MED ORDER — CEPHALEXIN 500 MG PO CAPS: 500.0000 mg | ORAL_CAPSULE | Freq: Two times a day (BID) | ORAL | 0 refills | Status: AC

## 2024-02-07 NOTE — Patient Instructions (Signed)
 We are checking labs today, will be in contact with any results that require further attention  Follow up with specialists as scheduled.  Follow up with me in about 3 months for labs and medication management, sooner if needed.

## 2024-02-07 NOTE — Progress Notes (Signed)
 Acute Office Visit  Subjective:     Patient ID: Kimberly Watson, female    DOB: [birth year], 83 y.o.   MRN: [id number]  Chief Complaint  Patient presents with   Follow-up    HPI  Discussed the use of AI scribe software for clinical note transcription with the patient, who gave verbal consent to proceed.  History of Present Illness Kimberly Watson is an 83 year old female with hypertension and osteoporosis who presents for a follow-up visit.  Hypertension and associated anxiety - Blood pressure frequently elevated during clinic visits - Experiences anxiety when blood pressure is high - Currently taking antihypertensive medication - Not regularly monitoring blood pressure at home  Lower extremity erythema and edema - History of cellulitis in the legs - Redness and swelling of the legs, particularly during summer months - Legs sometimes feel warm to the touch, resembling mild sunburn  Musculoskeletal pain and osteoporosis management - Receiving Prolia  for osteoporosis - Past allergic reaction to another osteoporosis medication resulted in pulmonary embolism - Severe radiating pain in shoulder, back, neck, and elbow approximately one month ago, lasting two weeks; attributes pain to arthritis or possible reaction to Prolia  - Wrist swelling occurred after a previous Prolia  injection  Anticoagulation and bleeding risk - On Eliquis  for history of pulmonary embolism - IVC filter in place - Occasionally misses doses of Eliquis  - History of gastrointestinal bleeding due to NSAID use - Allergic to NSAIDs  Preoperative preparation and weight management - Considering knee replacement surgery next year - Joined a gym to prepare for potential surgery - Focusing on weight management through dietary modifications     ROS Per HPI      Objective:    BP (!) 150/80 (BP Location: Left Arm, Patient Position: Sitting)   Pulse 63   Temp 98.2 F (36.8 C) (Temporal)   Ht 5' 1.5  (1.562 m)   Wt 175 lb (79.4 kg)   SpO2 92%   BMI 32.53 kg/m    Physical Exam Vitals and nursing note reviewed.  Constitutional:      General: She is not in acute distress.    Appearance: She is obese.  HENT:     Head: Normocephalic and atraumatic.     Right Ear: External ear normal.     Left Ear: External ear normal.     Nose: Nose normal.     Mouth/Throat:     Mouth: Mucous membranes are moist.     Pharynx: Oropharynx is clear.  Eyes:     Extraocular Movements: Extraocular movements intact.     Pupils: Pupils are equal, round, and reactive to light.  Neck:     Vascular: No carotid bruit.  Cardiovascular:     Rate and Rhythm: Normal rate and regular rhythm.     Pulses: Normal pulses.     Heart sounds: Normal heart sounds.  Pulmonary:     Effort: Pulmonary effort is normal. No respiratory distress.     Breath sounds: Normal breath sounds. No wheezing, rhonchi or rales.  Musculoskeletal:        General: Normal range of motion.     Cervical back: Normal range of motion.     Right lower leg: No edema.     Left lower leg: No edema.     Comments: Bilat lower extremities mildly erythematous, nontender, no open wounds, discharge, bleeding  Lymphadenopathy:     Cervical: No cervical adenopathy.  Neurological:     General: No  focal deficit present.     Mental Status: She is alert and oriented to person, place, and time.  Psychiatric:        Mood and Affect: Mood normal.        Thought Content: Thought content normal.     No results found for any visits on 02/07/24.      Assessment & Plan:   Assessment and Plan Assessment & Plan Hypertension Blood pressure elevated at 150/80 mmHg. Anxiety may contribute to readings. Initial high readings expected to decrease with regular monitoring. - Advise home blood pressure monitoring and record keeping. - Instruct to recheck after 10 minutes if elevated. - Evaluate if consistently 160/100 mmHg or higher.  Cellulitis Recurrent  redness and swelling in legs, especially in summer. Antibiotics were prescribed previously for leg swelling, with some improvement noted, but the condition recurred. Current symptoms include redness and swelling without significant pain. - Prescribe antibiotics if symptoms worsen. - Advise use of sunscreen at the beach. - Monitor for increased swelling or redness.  Osteoporosis On Prolia . Experienced severe bone pain possibly related to Prolia , resolved after two weeks. Previous reaction included wrist swelling. Discussed potential discontinuation of Prolia  if pain recurs. - Monitor for recurrent bone pain after Prolia  injections. - Encourage gym exercise to strengthen muscles before potential knee replacement. - Discuss potential discontinuation of Prolia  if pain recurs.  Chronic Anticoagulation Management On Eliquis  for DVT/PE. Concerns about discontinuing Eliquis  for potential knee replacement surgery. Discussed with Doctor Lonn about managing anticoagulation around surgery. IVC filter in place. - Consider temporary discontinuation of Eliquis  2-3 days before surgery. - Use Lovenox post-surgery if needed.  Hiatal Hernia Concerns about lying flat during potential knee replacement surgery. Advised reclining is acceptable. - Advise reclining if experiencing pressure or discomfort. - Monitor for reflux symptoms post-surgery.  General Health Maintenance Working on weight management and muscle strengthening for potential knee replacement surgery. Plans to join a gym and improve diet. - Encourage weight management and healthy diet. - Support gym exercise for muscle strengthening.  Follow-up Plans to go to the beach and monitor leg condition. Follow-up in three months to reassess conditions and treatment plans. - Schedule follow-up appointment in three months. - Monitor leg condition and report any changes.  Lab Orders         CBC with Differential/Platelet         Comprehensive  metabolic panel with GFR         Hemoglobin A1c         Lipid panel         VITAMIN D  25 Hydroxy (Vit-D Deficiency, Fractures)        Meds ordered this encounter  Medications   esomeprazole  (NEXIUM ) 40 MG capsule    Sig: Take  1 capsule  Daily  to Prevent Heartburn & Indigestion    Dispense:  90 capsule    Refill:  3   cephALEXin  (KEFLEX ) 500 MG capsule    Sig: Take 1 capsule (500 mg total) by mouth 2 (two) times daily for 5 days.    Dispense:  10 capsule    Refill:  0    Return in about 3 months (around 05/09/2024) for meds/labs.  Corean LITTIE Ku, FNP

## 2024-02-08 ENCOUNTER — Ambulatory Visit: Payer: Self-pay | Admitting: Family Medicine

## 2024-03-19 ENCOUNTER — Encounter: Payer: Medicare PPO | Admitting: Internal Medicine

## 2024-03-25 ENCOUNTER — Encounter: Payer: Medicare PPO | Admitting: Internal Medicine

## 2024-04-14 NOTE — Telephone Encounter (Signed)
 Prolia  VOB initiated via [email or web address]  Next Prolia  inj DUE: 05/29/24

## 2024-05-01 DIAGNOSIS — M1711 Unilateral primary osteoarthritis, right knee: Secondary | ICD-10-CM | POA: Diagnosis not present

## 2024-05-13 ENCOUNTER — Other Ambulatory Visit

## 2024-05-13 ENCOUNTER — Ambulatory Visit (INDEPENDENT_AMBULATORY_CARE_PROVIDER_SITE_OTHER)
Admission: RE | Admit: 2024-05-13 | Discharge: 2024-05-13 | Disposition: A | Discharge status: Home / Self Care | Source: Ambulatory Visit | Attending: Family Medicine | Admitting: Family Medicine

## 2024-05-13 DIAGNOSIS — E559 Vitamin D deficiency, unspecified: Secondary | ICD-10-CM

## 2024-05-13 DIAGNOSIS — M810 Age-related osteoporosis without current pathological fracture: Secondary | ICD-10-CM

## 2024-05-13 DIAGNOSIS — Z79899 Other long term (current) drug therapy: Secondary | ICD-10-CM

## 2024-05-18 NOTE — Telephone Encounter (Signed)
 Medical Buy and Bill  Prior Authorization required for PROLIA   VOB re-submitted due to recent insurance policy changes.

## 2024-05-23 NOTE — Telephone Encounter (Signed)
 Prior Authorization initiated for PROLIA  via [email or web address] KEY: [id number]

## 2024-05-27 NOTE — Telephone Encounter (Signed)
 Medical Buy and [employer]  Prior Authorization for PROLIA  APPROVED PA# [id number] Valid: 01/24/22-07/30/25

## 2024-05-29 NOTE — Telephone Encounter (Signed)
 Medical Buy and [employer]ell  Patient is ready for scheduling on or after 05/29/24  Out-of-pocket cost due at time of visit: $40  Primary: [employer] SHP Prolia  co-insurance: $40 Admin fee co-insurance: n/a  Deductible: does not apply  Prior Auth: APPROVED PA# [id number] Valid: 01/24/22-07/30/25  Secondary: N/A Prolia  co-insurance:  Admin fee co-insurance:  Deductible:  Prior Auth:  PA# Valid:   ** This summary of benefits is an estimation of the patient's out-of-pocket cost. Exact cost may vary based on individual plan coverage.

## 2024-06-09 ENCOUNTER — Encounter: Payer: Self-pay | Admitting: Family Medicine

## 2024-06-09 ENCOUNTER — Ambulatory Visit: Admitting: Family Medicine

## 2024-06-09 VITALS — BP 132/82 | HR 65 | Temp 97.8°F | Ht 61.5 in | Wt 172.0 lb

## 2024-06-09 DIAGNOSIS — D471 Chronic myeloproliferative disease: Secondary | ICD-10-CM | POA: Diagnosis not present

## 2024-06-09 DIAGNOSIS — Z86711 Personal history of pulmonary embolism: Secondary | ICD-10-CM | POA: Diagnosis not present

## 2024-06-09 DIAGNOSIS — Z23 Encounter for immunization: Secondary | ICD-10-CM

## 2024-06-09 DIAGNOSIS — R7309 Other abnormal glucose: Secondary | ICD-10-CM

## 2024-06-09 DIAGNOSIS — I10 Essential (primary) hypertension: Secondary | ICD-10-CM | POA: Diagnosis not present

## 2024-06-09 DIAGNOSIS — M1711 Unilateral primary osteoarthritis, right knee: Secondary | ICD-10-CM

## 2024-06-09 DIAGNOSIS — E782 Mixed hyperlipidemia: Secondary | ICD-10-CM

## 2024-06-09 DIAGNOSIS — D84821 Immunodeficiency due to drugs: Secondary | ICD-10-CM

## 2024-06-09 DIAGNOSIS — D6859 Other primary thrombophilia: Secondary | ICD-10-CM | POA: Diagnosis not present

## 2024-06-09 DIAGNOSIS — Z7901 Long term (current) use of anticoagulants: Secondary | ICD-10-CM | POA: Diagnosis not present

## 2024-06-09 DIAGNOSIS — Z95828 Presence of other vascular implants and grafts: Secondary | ICD-10-CM | POA: Diagnosis not present

## 2024-06-09 LAB — CBC WITH DIFFERENTIAL/PLATELET
Basophils Absolute: 0.1 K/uL (ref 0.0–0.1)
Basophils Relative: 1.1 % (ref 0.0–3.0)
Eosinophils Absolute: 0.1 K/uL (ref 0.0–0.7)
Eosinophils Relative: 2.1 % (ref 0.0–5.0)
HCT: 44.8 % (ref 36.0–46.0)
Hemoglobin: 14.8 g/dL (ref 12.0–15.0)
Lymphocytes Relative: 31 % (ref 12.0–46.0)
Lymphs Abs: 1.7 K/uL (ref 0.7–4.0)
MCHC: 32.9 g/dL (ref 30.0–36.0)
MCV: 104.1 fl — ABNORMAL HIGH (ref 78.0–100.0)
Monocytes Absolute: 0.6 K/uL (ref 0.1–1.0)
Monocytes Relative: 11.6 % (ref 3.0–12.0)
Neutro Abs: 3 K/uL (ref 1.4–7.7)
Neutrophils Relative %: 54.2 % (ref 43.0–77.0)
Platelets: 233 K/uL (ref 150.0–400.0)
RBC: 4.3 Mil/uL (ref 3.87–5.11)
RDW: 14.9 % (ref 11.5–15.5)
WBC: 5.5 K/uL (ref 4.0–10.5)

## 2024-06-09 LAB — COMPREHENSIVE METABOLIC PANEL WITH GFR
ALT: 14 U/L (ref 0–35)
AST: 20 U/L (ref 0–37)
Albumin: 4.4 g/dL (ref 3.5–5.2)
Alkaline Phosphatase: 63 U/L (ref 39–117)
BUN: 19 mg/dL (ref 6–23)
CO2: 28 meq/L (ref 19–32)
Calcium: 10.2 mg/dL (ref 8.4–10.5)
Chloride: 102 meq/L (ref 96–112)
Creatinine, Ser: 0.82 mg/dL (ref 0.40–1.20)
GFR: 65.77 mL/min (ref >60.00)
Glucose, Bld: 88 mg/dL (ref 70–99)
Potassium: 4.2 meq/L (ref 3.5–5.1)
Sodium: 138 meq/L (ref 135–145)
Total Bilirubin: 0.9 mg/dL (ref 0.2–1.2)
Total Protein: 7.5 g/dL (ref 6.0–8.3)

## 2024-06-09 LAB — HEMOGLOBIN A1C: Hgb A1c MFr Bld: 5.4 % (ref 4.6–6.5)

## 2024-06-09 MED ORDER — METOPROLOL SUCCINATE ER 100 MG PO TB24: ORAL_TABLET | ORAL | 3 refills | Status: AC

## 2024-06-09 NOTE — Patient Instructions (Addendum)
 Continue current medication.   We have given your flu vaccine today.   Refills of Metoprolol  have been sent to your pharmacy.   We are checking labs today, will be in contact with any results that require further attention  Follow up with me in about 3 months for labs and medication management, sooner if needed.

## 2024-06-09 NOTE — Progress Notes (Signed)
 Established Patient Office Visit  Subjective:     Patient ID: Kimberly Watson, female    DOB: [birth year], 82 y.o.   MRN: [id number]  Chief Complaint  Patient presents with   Medical Management of Chronic Issues    3 month f/u    HPI  Discussed the use of AI scribe software for clinical note transcription with the patient, who gave verbal consent to proceed.  History of Present Illness Kimberly Watson is an 83 year old female with knee issues and a history of pulmonary embolism who presents for a follow-up visit regarding her knee condition and medication management.  Knee pain and mobility impairment - Occasional sharp pain in the knee - Uses a cane for ambulation - Knee surgery is not advised due to history of pulmonary embolism  History of thromboembolism and anticoagulation therapy - History of pulmonary embolism - Permanent IVC filter in place - Takes Eliquis  for anticoagulation - Recent refill of Eliquis  completed - Plans to travel to [place]  for two weeks and has ensured an adequate supply of medication  Cardiovascular medication management - Takes metoprolol   Immunization status and concerns - Has not received influenza vaccine this year but intends to do so before traveling - Hesitant to receive second dose of shingles vaccine due to previous pain after first dose - Has not received pneumonia vaccine     ROS Per HPI      Objective:    BP 132/82   Pulse 65   Temp 97.8 F (36.6 C) (Temporal)   Ht 5' 1.5 (1.562 m)   Wt 172 lb (78 kg)   SpO2 94%   BMI 31.97 kg/m    Physical Exam Vitals and nursing note reviewed.  Constitutional:      General: She is not in acute distress.    Appearance: Normal appearance.  HENT:     Head: Normocephalic and atraumatic.     Right Ear: External ear normal.     Left Ear: External ear normal.     Nose: Nose normal.     Mouth/Throat:     Mouth: Mucous membranes are moist.     Pharynx: Oropharynx is  clear.  Eyes:     Extraocular Movements: Extraocular movements intact.     Pupils: Pupils are equal, round, and reactive to light.  Neck:     Vascular: No carotid bruit.  Cardiovascular:     Rate and Rhythm: Normal rate and regular rhythm.     Pulses: Normal pulses.     Heart sounds: Normal heart sounds.  Pulmonary:     Effort: Pulmonary effort is normal. No respiratory distress.     Breath sounds: Normal breath sounds. No wheezing, rhonchi or rales.  Musculoskeletal:     Cervical back: Normal range of motion.     Right lower leg: No edema.     Left lower leg: No edema.     Comments: Using single point cane  Lymphadenopathy:     Cervical: No cervical adenopathy.  Neurological:     General: No focal deficit present.     Mental Status: She is alert and oriented to person, place, and time.  Psychiatric:        Mood and Affect: Mood normal.        Thought Content: Thought content normal.     No results found for any visits on 06/09/24.   BP Readings from Last 3 Encounters:  06/09/24 132/82  02/07/24 (!) 150/80  01/10/24 (!) 130/90   Wt Readings from Last 3 Encounters:  06/09/24 172 lb (78 kg)  02/07/24 175 lb (79.4 kg)  01/10/24 175 lb (79.4 kg)      Last CBC Lab Results  Component Value Date   WBC 4.7 02/07/2024   HGB 14.8 02/07/2024   HCT 45.2 02/07/2024   MCV 105.7 (H) 02/07/2024   MCH 34.7 (H) 01/08/2024   RDW 14.2 02/07/2024   PLT 222.0 02/07/2024   Last metabolic panel Lab Results  Component Value Date   GLUCOSE 87 02/07/2024   NA 138 02/07/2024   K 4.4 02/07/2024   CL 104 02/07/2024   CO2 30 02/07/2024   BUN 17 02/07/2024   CREATININE 0.85 02/07/2024   GFR 63.14 02/07/2024   CALCIUM  9.5 02/07/2024   PHOS 3.7 08/22/2015   PROT 7.5 02/07/2024   ALBUMIN  4.3 02/07/2024   BILITOT 0.8 02/07/2024   ALKPHOS 48 02/07/2024   AST 17 02/07/2024   ALT 13 02/07/2024   ANIONGAP 10 12/09/2020   Last lipids Lab Results  Component Value Date   CHOL 157  02/07/2024   HDL 55.80 02/07/2024   LDLCALC 78 02/07/2024   TRIG 116.0 02/07/2024   CHOLHDL 3 02/07/2024   Last hemoglobin A1c Lab Results  Component Value Date   HGBA1C 5.5 02/07/2024         Assessment & Plan:   Assessment and Plan Assessment & Plan Essential hypertension Hypertension management ongoing. Weight reduction may aid control. Current regimen includes metoprolol . - Refilled metoprolol  prescription for travel.  Osteoarthritis of the right knee  Uses cane for ambulation due to pain. Surgery deferred due to clot risk and IVC filter. Maintains good mobility. - Continue cane use as needed. - Consider gym with pool for low-impact exercise.  History of pulmonary embolism,  IVC filter in place, neck anticoagulation, hypercoagulable state Permanent IVC filter in place. On chronic anticoagulation with Eliquis . Surgery deferred due to clot risk. - Continue Eliquis  for anticoagulation.  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 Lonn as scheduled -Continue hydroxyurea  - CBC, CMP today  Abnormal Glucose - A1c today  Immunization due Due for flu and pneumonia vaccines. Received one shingles vaccine dose; hesitant about second due to discomfort. - Administered flu shot today. - Discussed pneumonia vaccine; not received yet. - Discussed shingles vaccine; hesitant about second dose.     Orders Placed This Encounter  Procedures   Flu vaccine HIGH DOSE PF(Fluzone Trivalent)   CBC with Differential/Platelet    Release to patient:   Immediate [1]   Comprehensive metabolic panel with GFR    Release to patient:   Immediate [1]   Hemoglobin A1c     Meds ordered this encounter  Medications   metoprolol  succinate (TOPROL -XL) 100 MG 24 hr tablet    Sig: TAKE 1 TABLET BY MOUTH EVERY DAY FOR BLOOD PRESSURE    Dispense:  90 tablet    Refill:  3    Do not need to fill yet, sending refills    Return in  about 3 months (around 09/09/2024) for meds OV.  Corean LITTIE Ku, FNP

## 2024-06-10 ENCOUNTER — Ambulatory Visit: Payer: Self-pay | Admitting: Family Medicine

## 2024-06-13 ENCOUNTER — Ambulatory Visit

## 2024-06-13 DIAGNOSIS — M810 Age-related osteoporosis without current pathological fracture: Secondary | ICD-10-CM

## 2024-06-13 MED ORDER — DENOSUMAB 60 MG/ML ~~LOC~~ SOSY: 60.0000 mg | PREFILLED_SYRINGE | Freq: Once | SUBCUTANEOUS | Status: AC

## 2024-06-13 NOTE — Progress Notes (Signed)
 Medication: Prolia  Dosage:60 mg Location: Left Arm Injection given ab:[id number] Provider: Lela Trixie COME

## 2024-06-16 NOTE — Telephone Encounter (Signed)
 Last Prolia  inj 06/13/24 Next Prolia  inj due 12/12/24

## 2024-07-10 ENCOUNTER — Inpatient Hospital Stay: Attending: Hematology and Oncology

## 2024-07-10 ENCOUNTER — Encounter: Payer: Self-pay | Admitting: Hematology and Oncology

## 2024-07-10 ENCOUNTER — Inpatient Hospital Stay: Admitting: Hematology and Oncology

## 2024-07-10 VITALS — BP 155/99 | HR 71 | Resp 18 | Ht 61.5 in | Wt 173.8 lb

## 2024-07-10 DIAGNOSIS — K250 Acute gastric ulcer with hemorrhage: Secondary | ICD-10-CM

## 2024-07-10 DIAGNOSIS — Z7901 Long term (current) use of anticoagulants: Secondary | ICD-10-CM | POA: Insufficient documentation

## 2024-07-10 DIAGNOSIS — D471 Chronic myeloproliferative disease: Secondary | ICD-10-CM | POA: Insufficient documentation

## 2024-07-10 DIAGNOSIS — I82402 Acute embolism and thrombosis of unspecified deep veins of left lower extremity: Secondary | ICD-10-CM

## 2024-07-10 DIAGNOSIS — Z86718 Personal history of other venous thrombosis and embolism: Secondary | ICD-10-CM | POA: Diagnosis not present

## 2024-07-10 LAB — CBC WITH DIFFERENTIAL/PLATELET
Abs Immature Granulocytes: 0.02 K/uL (ref 0.00–0.07)
Basophils Absolute: 0.1 K/uL (ref 0.0–0.1)
Basophils Relative: 1 %
Eosinophils Absolute: 0.1 K/uL (ref 0.0–0.5)
Eosinophils Relative: 2 %
HCT: 44.6 % (ref 36.0–46.0)
Hemoglobin: 14.7 g/dL (ref 12.0–15.0)
Immature Granulocytes: 0 %
Lymphocytes Relative: 28 %
Lymphs Abs: 1.6 K/uL (ref 0.7–4.0)
MCH: 34.3 pg — ABNORMAL HIGH (ref 26.0–34.0)
MCHC: 33 g/dL (ref 30.0–36.0)
MCV: 104.2 fL — ABNORMAL HIGH (ref 80.0–100.0)
Monocytes Absolute: 0.6 K/uL (ref 0.1–1.0)
Monocytes Relative: 11 %
Neutro Abs: 3.4 K/uL (ref 1.7–7.7)
Neutrophils Relative %: 58 %
Platelets: 225 K/uL (ref 150–400)
RBC: 4.28 MIL/uL (ref 3.87–5.11)
RDW: 14 % (ref 11.5–15.5)
WBC: 5.8 K/uL (ref 4.0–10.5)
nRBC: 0 % (ref 0.0–0.2)

## 2024-07-10 MED ORDER — APIXABAN 2.5 MG PO TABS: 2.5000 mg | ORAL_TABLET | Freq: Two times a day (BID) | ORAL | 1 refills | Status: AC

## 2024-07-10 NOTE — Assessment & Plan Note (Addendum)
 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

## 2024-07-10 NOTE — Assessment & Plan Note (Addendum)
 She had history of recurrent DVT/PE She is at high risk also due to myeloproliferative disorder The plan will be to go on prophylactic maintenance apixaban  indefinitely I refilled her prescription She denies bleeding complications

## 2024-07-10 NOTE — Progress Notes (Signed)
 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 positive, BCR/ABL negative consistent with myeloproliferative disorder, 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 On 09/17/2017, she started taking hydroxyurea  500 mg daily Over the past few years, the doses of her medication has been adjusted She is compliant taking medication as directed CBC is normal She will continue current dose of hydroxyurea  without changes; she takes 500 mg daily except on Mondays, Wednesday, Friday and Saturday, she will take 1000 mg I plan to see her again in 6 months for further follow-up Personal history of venous thrombosis and embolism She had history of recurrent DVT/PE She is at high risk also due to myeloproliferative disorder The plan will be to go on prophylactic maintenance apixaban  indefinitely I refilled her prescription She denies bleeding complications  No orders of the defined types were placed in this encounter.    Almarie Bedford, MD  INTERVAL HISTORY: she returns for surveillance follow-up for myeloproliferative disorder/neoplasm Patient denies recent bleeding such as epistaxis, hematuria or hematochezia No recent infection We reviewed medication list and discussed medication changes We discussed test results and future plan of care as outlined above  PHYSICAL EXAMINATION: ECOG PERFORMANCE STATUS: 0 - Asymptomatic  There were no vitals filed for this visit. Lab Results  Component Value Date   WBC 5.8 07/10/2024   HGB 14.7  07/10/2024   HCT 44.6 07/10/2024   MCV 104.2 (H) 07/10/2024   PLT 225 07/10/2024

## 2024-08-13 ENCOUNTER — Ambulatory Visit: Payer: Medicare PPO | Admitting: Nurse Practitioner

## 2024-09-16 ENCOUNTER — Ambulatory Visit: Admitting: Family Medicine

## 2024-12-12 ENCOUNTER — Ambulatory Visit

## 2025-01-06 ENCOUNTER — Inpatient Hospital Stay

## 2025-01-06 ENCOUNTER — Inpatient Hospital Stay: Admitting: Hematology and Oncology

## 2025-01-13 ENCOUNTER — Ambulatory Visit
